# Patient Record
Sex: Female | Born: 1935 | Race: White | Hispanic: No | Marital: Married | Smoking: Never smoker
Health system: Southern US, Community
[De-identification: ages and names within clinical notes are randomized; demographics above are authoritative.]

## PROBLEM LIST (undated history)

## (undated) DIAGNOSIS — M1990 Unspecified osteoarthritis, unspecified site: Secondary | ICD-10-CM

## (undated) DIAGNOSIS — T148XXA Other injury of unspecified body region, initial encounter: Secondary | ICD-10-CM

## (undated) DIAGNOSIS — M797 Fibromyalgia: Secondary | ICD-10-CM

## (undated) DIAGNOSIS — I2510 Atherosclerotic heart disease of native coronary artery without angina pectoris: Secondary | ICD-10-CM

## (undated) DIAGNOSIS — G20 Parkinson's disease: Secondary | ICD-10-CM

## (undated) DIAGNOSIS — F458 Other somatoform disorders: Secondary | ICD-10-CM

## (undated) DIAGNOSIS — G20A1 Parkinson's disease without dyskinesia, without mention of fluctuations: Secondary | ICD-10-CM

## (undated) DIAGNOSIS — E785 Hyperlipidemia, unspecified: Secondary | ICD-10-CM

## (undated) DIAGNOSIS — R011 Cardiac murmur, unspecified: Secondary | ICD-10-CM

## (undated) DIAGNOSIS — R609 Edema, unspecified: Secondary | ICD-10-CM

## (undated) DIAGNOSIS — K589 Irritable bowel syndrome without diarrhea: Secondary | ICD-10-CM

## (undated) DIAGNOSIS — R09A2 Foreign body sensation, throat: Secondary | ICD-10-CM

## (undated) DIAGNOSIS — K219 Gastro-esophageal reflux disease without esophagitis: Secondary | ICD-10-CM

## (undated) DIAGNOSIS — F419 Anxiety disorder, unspecified: Secondary | ICD-10-CM

## (undated) DIAGNOSIS — I639 Cerebral infarction, unspecified: Secondary | ICD-10-CM

## (undated) DIAGNOSIS — R52 Pain, unspecified: Secondary | ICD-10-CM

## (undated) DIAGNOSIS — D329 Benign neoplasm of meninges, unspecified: Secondary | ICD-10-CM

## (undated) HISTORY — PX: KIDNEY SURGERY: SHX687

## (undated) HISTORY — PX: BRAIN SURGERY: SHX531

## (undated) HISTORY — DX: Hyperlipidemia, unspecified: E78.5

## (undated) HISTORY — PX: TONSILLECTOMY: SUR1361

## (undated) HISTORY — PX: PARTIAL HYSTERECTOMY: SHX80

## (undated) HISTORY — DX: Fibromyalgia: M79.7

## (undated) HISTORY — DX: Cerebral infarction, unspecified: I63.9

## (undated) HISTORY — DX: Irritable bowel syndrome, unspecified: K58.9

## (undated) HISTORY — PX: GLAUCOMA REPAIR: SHX214

## (undated) HISTORY — PX: COLONOSCOPY: SHX174

## (undated) HISTORY — DX: Benign neoplasm of meninges, unspecified: D32.9

## (undated) HISTORY — DX: Other somatoform disorders: F45.8

## (undated) HISTORY — PX: CORONARY ANGIOPLASTY: SHX604

## (undated) HISTORY — PX: CATARACT EXTRACTION: SUR2

## (undated) HISTORY — DX: Atherosclerotic heart disease of native coronary artery without angina pectoris: I25.10

## (undated) HISTORY — DX: Foreign body sensation, throat: R09.A2

---

## 2002-09-29 ENCOUNTER — Ambulatory Visit (HOSPITAL_COMMUNITY): Admission: RE | Admit: 2002-09-29 | Discharge: 2002-09-29 | Payer: Self-pay | Admitting: *Deleted

## 2002-10-16 ENCOUNTER — Ambulatory Visit (HOSPITAL_COMMUNITY): Admission: RE | Admit: 2002-10-16 | Discharge: 2002-10-16 | Payer: Self-pay | Admitting: *Deleted

## 2002-10-29 ENCOUNTER — Encounter: Payer: Self-pay | Admitting: Cardiology

## 2002-10-29 ENCOUNTER — Encounter (HOSPITAL_COMMUNITY): Admission: RE | Admit: 2002-10-29 | Discharge: 2002-11-28 | Payer: Self-pay | Admitting: Cardiology

## 2002-12-11 ENCOUNTER — Encounter: Payer: Self-pay | Admitting: Urology

## 2002-12-11 ENCOUNTER — Ambulatory Visit (HOSPITAL_COMMUNITY): Admission: RE | Admit: 2002-12-11 | Discharge: 2002-12-11 | Payer: Self-pay | Admitting: Urology

## 2003-02-22 ENCOUNTER — Ambulatory Visit (HOSPITAL_COMMUNITY): Admission: RE | Admit: 2003-02-22 | Discharge: 2003-02-22 | Payer: Self-pay | Admitting: Urology

## 2003-02-22 ENCOUNTER — Ambulatory Visit (HOSPITAL_BASED_OUTPATIENT_CLINIC_OR_DEPARTMENT_OTHER): Admission: RE | Admit: 2003-02-22 | Discharge: 2003-02-22 | Payer: Self-pay | Admitting: Urology

## 2003-03-24 ENCOUNTER — Encounter: Admission: RE | Admit: 2003-03-24 | Discharge: 2003-03-24 | Payer: Self-pay | Admitting: Urology

## 2003-05-10 ENCOUNTER — Ambulatory Visit (HOSPITAL_COMMUNITY): Admission: RE | Admit: 2003-05-10 | Discharge: 2003-05-10 | Payer: Self-pay | Admitting: Urology

## 2003-05-10 ENCOUNTER — Ambulatory Visit (HOSPITAL_BASED_OUTPATIENT_CLINIC_OR_DEPARTMENT_OTHER): Admission: RE | Admit: 2003-05-10 | Discharge: 2003-05-10 | Payer: Self-pay | Admitting: Urology

## 2003-07-19 ENCOUNTER — Ambulatory Visit (HOSPITAL_COMMUNITY): Admission: RE | Admit: 2003-07-19 | Discharge: 2003-07-19 | Payer: Self-pay | Admitting: Urology

## 2003-10-07 ENCOUNTER — Ambulatory Visit (HOSPITAL_COMMUNITY): Admission: RE | Admit: 2003-10-07 | Discharge: 2003-10-07 | Payer: Self-pay | Admitting: Urology

## 2003-11-15 ENCOUNTER — Inpatient Hospital Stay (HOSPITAL_COMMUNITY): Admission: RE | Admit: 2003-11-15 | Discharge: 2003-11-18 | Payer: Self-pay | Admitting: Urology

## 2003-11-15 ENCOUNTER — Encounter (INDEPENDENT_AMBULATORY_CARE_PROVIDER_SITE_OTHER): Payer: Self-pay | Admitting: Specialist

## 2004-03-28 ENCOUNTER — Ambulatory Visit (HOSPITAL_COMMUNITY): Admission: RE | Admit: 2004-03-28 | Discharge: 2004-03-28 | Payer: Self-pay | Admitting: Urology

## 2004-04-26 ENCOUNTER — Ambulatory Visit: Payer: Self-pay | Admitting: *Deleted

## 2004-08-03 ENCOUNTER — Ambulatory Visit (HOSPITAL_COMMUNITY): Admission: RE | Admit: 2004-08-03 | Discharge: 2004-08-03 | Payer: Self-pay | Admitting: Family Medicine

## 2004-10-18 ENCOUNTER — Ambulatory Visit (HOSPITAL_COMMUNITY): Admission: RE | Admit: 2004-10-18 | Discharge: 2004-10-18 | Payer: Self-pay | Admitting: Family Medicine

## 2005-05-02 ENCOUNTER — Ambulatory Visit: Payer: Self-pay | Admitting: *Deleted

## 2005-08-20 ENCOUNTER — Ambulatory Visit (HOSPITAL_COMMUNITY): Admission: RE | Admit: 2005-08-20 | Discharge: 2005-08-20 | Payer: Self-pay | Admitting: Family Medicine

## 2005-08-24 ENCOUNTER — Ambulatory Visit (HOSPITAL_COMMUNITY): Admission: RE | Admit: 2005-08-24 | Discharge: 2005-08-24 | Payer: Self-pay | Admitting: Neurosurgery

## 2005-09-05 ENCOUNTER — Ambulatory Visit: Payer: Self-pay | Admitting: *Deleted

## 2005-09-05 ENCOUNTER — Encounter: Payer: Self-pay | Admitting: Internal Medicine

## 2005-09-12 ENCOUNTER — Encounter (INDEPENDENT_AMBULATORY_CARE_PROVIDER_SITE_OTHER): Payer: Self-pay | Admitting: *Deleted

## 2005-09-12 ENCOUNTER — Inpatient Hospital Stay (HOSPITAL_COMMUNITY): Admission: RE | Admit: 2005-09-12 | Discharge: 2005-09-19 | Payer: Self-pay | Admitting: Neurosurgery

## 2005-09-15 ENCOUNTER — Encounter: Payer: Self-pay | Admitting: Internal Medicine

## 2005-09-15 ENCOUNTER — Encounter: Payer: Self-pay | Admitting: Vascular Surgery

## 2005-09-15 ENCOUNTER — Ambulatory Visit: Payer: Self-pay | Admitting: Internal Medicine

## 2005-09-17 ENCOUNTER — Ambulatory Visit: Payer: Self-pay | Admitting: Physical Medicine & Rehabilitation

## 2005-09-19 ENCOUNTER — Inpatient Hospital Stay (HOSPITAL_COMMUNITY)
Admission: RE | Admit: 2005-09-19 | Discharge: 2005-10-05 | Payer: Self-pay | Admitting: Physical Medicine & Rehabilitation

## 2005-10-08 ENCOUNTER — Encounter (HOSPITAL_COMMUNITY)
Admission: RE | Admit: 2005-10-08 | Discharge: 2005-11-07 | Payer: Self-pay | Admitting: Physical Medicine & Rehabilitation

## 2005-11-01 ENCOUNTER — Encounter: Admission: RE | Admit: 2005-11-01 | Discharge: 2005-11-01 | Payer: Self-pay | Admitting: Neurosurgery

## 2005-11-07 ENCOUNTER — Ambulatory Visit: Payer: Self-pay | Admitting: *Deleted

## 2005-11-07 ENCOUNTER — Encounter: Payer: Self-pay | Admitting: Internal Medicine

## 2005-11-08 ENCOUNTER — Encounter (HOSPITAL_COMMUNITY)
Admission: RE | Admit: 2005-11-08 | Discharge: 2005-12-08 | Payer: Self-pay | Admitting: Physical Medicine & Rehabilitation

## 2005-11-15 ENCOUNTER — Encounter
Admission: RE | Admit: 2005-11-15 | Discharge: 2006-02-13 | Payer: Self-pay | Admitting: Physical Medicine & Rehabilitation

## 2005-11-15 ENCOUNTER — Ambulatory Visit: Payer: Self-pay | Admitting: Physical Medicine & Rehabilitation

## 2005-11-16 ENCOUNTER — Ambulatory Visit: Payer: Self-pay | Admitting: *Deleted

## 2006-01-14 ENCOUNTER — Ambulatory Visit: Payer: Self-pay | Admitting: Physical Medicine & Rehabilitation

## 2006-01-17 ENCOUNTER — Encounter (HOSPITAL_COMMUNITY)
Admission: RE | Admit: 2006-01-17 | Discharge: 2006-02-15 | Payer: Self-pay | Admitting: Physical Medicine & Rehabilitation

## 2006-02-11 ENCOUNTER — Encounter
Admission: RE | Admit: 2006-02-11 | Discharge: 2006-05-12 | Payer: Self-pay | Admitting: Physical Medicine & Rehabilitation

## 2006-02-15 ENCOUNTER — Ambulatory Visit: Payer: Self-pay | Admitting: Internal Medicine

## 2006-03-13 ENCOUNTER — Encounter (HOSPITAL_COMMUNITY)
Admission: RE | Admit: 2006-03-13 | Discharge: 2006-04-12 | Payer: Self-pay | Admitting: Physical Medicine & Rehabilitation

## 2006-03-25 ENCOUNTER — Ambulatory Visit: Payer: Self-pay | Admitting: Physical Medicine & Rehabilitation

## 2006-04-15 ENCOUNTER — Encounter (HOSPITAL_COMMUNITY)
Admission: RE | Admit: 2006-04-15 | Discharge: 2006-05-15 | Payer: Self-pay | Admitting: Physical Medicine & Rehabilitation

## 2006-06-21 ENCOUNTER — Emergency Department (HOSPITAL_COMMUNITY): Admission: EM | Admit: 2006-06-21 | Discharge: 2006-06-21 | Payer: Self-pay | Admitting: Emergency Medicine

## 2006-07-17 ENCOUNTER — Encounter
Admission: RE | Admit: 2006-07-17 | Discharge: 2006-10-15 | Payer: Self-pay | Admitting: Physical Medicine & Rehabilitation

## 2006-07-18 ENCOUNTER — Ambulatory Visit: Payer: Self-pay | Admitting: Physical Medicine & Rehabilitation

## 2006-08-02 ENCOUNTER — Ambulatory Visit: Payer: Self-pay | Admitting: Gastroenterology

## 2006-09-10 ENCOUNTER — Encounter: Admission: RE | Admit: 2006-09-10 | Discharge: 2006-09-10 | Payer: Self-pay | Admitting: Neurosurgery

## 2007-02-27 ENCOUNTER — Ambulatory Visit (HOSPITAL_COMMUNITY): Admission: RE | Admit: 2007-02-27 | Discharge: 2007-02-27 | Payer: Self-pay | Admitting: Family Medicine

## 2007-05-02 ENCOUNTER — Ambulatory Visit (HOSPITAL_COMMUNITY): Admission: RE | Admit: 2007-05-02 | Discharge: 2007-05-02 | Payer: Self-pay | Admitting: Family Medicine

## 2007-08-01 ENCOUNTER — Encounter: Admission: RE | Admit: 2007-08-01 | Discharge: 2007-08-01 | Payer: Self-pay | Admitting: Neurosurgery

## 2008-02-05 ENCOUNTER — Ambulatory Visit: Payer: Self-pay | Admitting: Gastroenterology

## 2008-02-13 ENCOUNTER — Ambulatory Visit (HOSPITAL_COMMUNITY): Admission: RE | Admit: 2008-02-13 | Discharge: 2008-02-13 | Payer: Self-pay | Admitting: Gastroenterology

## 2008-05-03 ENCOUNTER — Ambulatory Visit (HOSPITAL_COMMUNITY): Admission: RE | Admit: 2008-05-03 | Discharge: 2008-05-03 | Payer: Self-pay | Admitting: Family Medicine

## 2008-11-03 DIAGNOSIS — E785 Hyperlipidemia, unspecified: Secondary | ICD-10-CM | POA: Insufficient documentation

## 2008-11-03 DIAGNOSIS — I2510 Atherosclerotic heart disease of native coronary artery without angina pectoris: Secondary | ICD-10-CM

## 2008-12-10 ENCOUNTER — Ambulatory Visit (HOSPITAL_COMMUNITY): Payer: Self-pay | Admitting: Family Medicine

## 2008-12-10 ENCOUNTER — Encounter (HOSPITAL_COMMUNITY): Admission: RE | Admit: 2008-12-10 | Discharge: 2009-01-09 | Payer: Self-pay | Admitting: Family Medicine

## 2009-05-04 ENCOUNTER — Ambulatory Visit (HOSPITAL_COMMUNITY): Admission: RE | Admit: 2009-05-04 | Discharge: 2009-05-04 | Payer: Self-pay | Admitting: Family Medicine

## 2009-08-01 ENCOUNTER — Ambulatory Visit (HOSPITAL_COMMUNITY): Admission: RE | Admit: 2009-08-01 | Discharge: 2009-08-01 | Payer: Self-pay | Admitting: Family Medicine

## 2009-08-24 ENCOUNTER — Ambulatory Visit (HOSPITAL_COMMUNITY): Admission: RE | Admit: 2009-08-24 | Discharge: 2009-08-24 | Payer: Self-pay | Admitting: Urology

## 2010-03-02 ENCOUNTER — Ambulatory Visit (HOSPITAL_COMMUNITY)
Admission: RE | Admit: 2010-03-02 | Discharge: 2010-03-02 | Payer: Self-pay | Source: Home / Self Care | Admitting: Family Medicine

## 2010-04-19 ENCOUNTER — Ambulatory Visit: Payer: Medicare Other | Admitting: Ophthalmology

## 2010-05-02 ENCOUNTER — Ambulatory Visit: Payer: Medicare Other | Admitting: Ophthalmology

## 2010-05-11 ENCOUNTER — Ambulatory Visit (HOSPITAL_COMMUNITY)
Admission: RE | Admit: 2010-05-11 | Discharge: 2010-05-11 | Payer: Self-pay | Source: Home / Self Care | Attending: Family Medicine | Admitting: Family Medicine

## 2010-06-10 ENCOUNTER — Encounter: Payer: Self-pay | Admitting: Urology

## 2010-06-11 ENCOUNTER — Encounter: Payer: Self-pay | Admitting: *Deleted

## 2010-06-11 ENCOUNTER — Encounter: Payer: Self-pay | Admitting: Physical Medicine & Rehabilitation

## 2010-10-03 NOTE — H&P (Signed)
NAME:  Samantha Hudson, Samantha Hudson               ACCOUNT NO.:  [id number]   MEDICAL RECORD NO.:  [id number]          PATIENT TYPE:  AMB   LOCATION:  DAY                           FACILITY:  [facility]   PHYSICIAN:  Samantha Hudson, M.D.      DATE OF BIRTH:  Oct 16, 1934   DATE OF ADMISSION:  DATE OF DISCHARGE:  LH                              HISTORY & PHYSICAL   PRIMARY CARE PHYSICIAN:  Samantha A. Gerda Diss, MD   CHIEF COMPLAINT:  Dysphagia.   HISTORY OF PRESENT ILLNESS:  Samantha Hudson is a 76 year old Caucasian  female.  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  Samantha Hudson also has history  of IBS.  Samantha Hudson has noted increased saliva and increased throat clearing  with pulling in the back of her throat since Samantha Hudson had a CVA back in 2007.  Samantha Hudson is noticing it is harder to get her food down her esophagus.  Samantha Hudson  feels as though it sometimes gets stuck in her upper esophagus.  Samantha Hudson has  to swallow several times before food goes down.  Samantha Hudson denies any problems  with regurgitation.  Samantha Hudson was taking ranitidine p.r.n. for coughing and  frequent throat clearing, but especially occurred nocturnally.  Samantha Hudson  denies any heartburn or indigestion.  Denies any anorexia.  Her weight  is down about 15 pounds since her CVA 2 years ago, but has been  relatively stable over the last year.  Samantha Hudson denies any problems with her  bowel movements.  Samantha Hudson occasionally has taken lactulose for constipation.  Samantha Hudson denies any positive diarrhea, rectal bleeding, or melena.   PAST MEDICAL AND SURGICAL HISTORY:  1. Samantha Hudson had a CVA in 2007 with some left-sided deficit.  2. Samantha Hudson has history of mitral valve prolapse.  3. Hypercholesterolemia.  4. Osteoporosis.  5. Coronary artery disease with PTCA.  6. IBS.  7. Fibromyalgia.  8. Globus hystericus.  9. Neuropathy.  10.Benign brain tumor was removed back in 2007 and 2 days postop of      when Samantha Hudson had a CVA.  11.Samantha Hudson is status post tonsillectomy.  12.Partial hysterectomy.  13.Right eye surgery.  14.Left kidney surgery.  15.Meningioma.  16.History of glaucoma.   CURRENT MEDICATIONS:  1. Atenolol 25 mg b.i.d.  2. Aggrenox b.i.d.  3. Gabapentin 400 mg q.i.d.  4. Lipitor 10 mg daily.  5. Cymbalta 30 mg daily.   ALLERGIES:  PENICILLIN causes rash.   FAMILY HISTORY:  There is a family history of liver disease in her  father who died at 76.  Samantha Hudson does not know anything else about this.  Mother deceased in her 80s secondary to coronary artery disease.  Two  sisters, one with breast cancer and 2 healthy brothers.   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  Samantha Hudson has 2 grown  healthy daughters.  Samantha Hudson is married.  Samantha Hudson denies any tobacco, alcohol, or  drug use.   REVIEW OF SYSTEMS:  See HPI, otherwise negative.   PHYSICAL EXAMINATION:  VITAL SIGNS: Weight 106 pounds, height 65.5  inches, temperature 97.4, blood pressure 116/52, and pulse 92.  GENERAL:  Samantha Hudson is a well-developed, well-nourished elderly Caucasian  female in no acute distress.  Samantha Hudson is quite thin.  HEENT:  Sclerae clear, nonicteric.  Conjunctivae pink.  Oropharynx pink  and moist without any lesions.  NECK:  Supple without mass or thyromegaly.  CHEST:  Heart regular rate and rhythm.  Normal S1 and S2 without  murmurs, clicks, rubs, or gallops.  Samantha Hudson does have a 2/6 murmur noted.  LUNGS:  Clear to auscultation bilaterally.  ABDOMEN:  Positive bowel sounds x4.  No bruits auscultated.  Soft,  nontender, and nondistended without palpable mass or hepatosplenomegaly.  No rebound tenderness or guarding.  EXTREMITIES:  No clubbing or edema.  Samantha Hudson does have mild left-sided  extremity weakness.   Laboratory studies from January 30, 2008, Samantha Hudson had a normal MET-7 and  normal LFTs.   IMPRESSION:  Samantha Hudson is a 76 year old Caucasian female with history of  cerebrovascular accident who has been having dysphagia, which has  worsened over the last 2 years since her cerebrovascular accident.  Samantha Hudson  also has history of globus sensation, increased  throat clearing, and  pooling of secretions in the posterior oropharynx.  Samantha Hudson denies any  gastroesophageal reflux disease symptoms.  Samantha Hudson may have an element of  esophageal dysphagia, but much of her symptoms could be explained by  oropharyngeal dysphagia.  Samantha Hudson is going to require further evaluation.  We did talk about starting with an esophagogastroduodenoscopy to rule  out structural abnormalities including esophageal web ring or stricture  versus pursuing barium pill esophagram, which should be able to rule out  all the above as well as look for oropharyngeal component.  Samantha Hudson would  like to proceed with the latter.   PLAN:  1. Barium pill esophagram.  If there is evidence of web ring stricture      or malignancy, Samantha Hudson will need followup EGD      with possible esophageal dilatation.  This has been explained to      her.  Samantha Hudson is in agreement with this plan.  2. Further recommendations pending barium pill esophagram.   ADDENDUM 161096:  BaSw: nonspecific esophageal motility disorder      Samantha Hudson, N.P.      Samantha Hudson, M.D.  Electronically Signed    KJ/MEDQ  D:  02/05/2008  T:  02/06/2008  Job:  [id number]   cc:   Samantha Picket A. Gerda Diss, MD  Fax: [phone]

## 2010-10-06 NOTE — H&P (Signed)
[name], Hudson NO.:  [id number]   MEDICAL RECORD NO.:  [id number]          PATIENT TYPE:  IPS   LOCATION:  NA                           FACILITY:  [facility]   PHYSICIAN:  Ranelle Oyster, M.D.DATE OF BIRTH:  Feb 09, 1935   DATE OF ADMISSION:  DATE OF DISCHARGE:                                HISTORY & PHYSICAL   OTHER PHYSICIANS OF RECORD:  Dr. Jeral Fruit, Dr. Pearlean Brownie, and Dr. Gerda Diss in  [place] - the patient's PCP.   CHIEF COMPLAINT:  Left-sided weakness.   HISTORY OF PRESENT ILLNESS:  This is a 76 year old white female admitted on  September 12, 2005, with headache and left-sided weakness.  MRI revealed a large  tumor in the middle fossa felt to be a meningioma.  The patient underwent a  right frontotemporal craniectomy with resection of meningioma on April 25 by  Dr. Jeral Fruit.  Postoperatively, the patient has had left-sided weakness.  The  patient upon followup MRI was found to have a low density right MCA stroke  secondary to complete occlusion of the right MCA.  Echocardiogram was  unremarkable.  The patient was placed on aspirin and Plavix by neurology and  changed to Aggrenox on May 2.  The patient is on D-3 honey liquid diet due  to dysphagia.  The patient had a repeat swallowing test today where her diet  was advanced to D-3 thin liquids.  Followup head CT ordered today is still  pending.   REVIEW OF SYSTEMS:  The patient has noted anxiety, occasional headaches, and  reflux.  Otherwise, pertinent positives are listed above and full review is  in the written H&P.   PAST MEDICAL HISTORY:  Positive for hypertension, hyperlipidemia, coronary  artery disease, left laser eye surgery, history of cysto/ureteroscopy,  history of T&A, hysterectomy, and anxiety disorder.  She smoked tobacco  remotely.   FAMILY HISTORY:  Positive for coronary artery disease.   SOCIAL HISTORY:  The patient is retired, married.  She lives in a one-level  house with two steps to  enter.  Husband works full-time as a Teacher, early years/pre.  The patient has excellent family support at home.  The patient was  independent completely prior to arrival.   MEDICATIONS PRIOR TO ARRIVAL:  1.  Aspirin 325 mg daily.  2.  Lipitor 10 mg daily.  3.  Atenolol 25 mg b.i.d.  4.  Lorazepam 0.5 mg daily.  5.  Paxil 10 mg daily.   ALLERGIES:  1.  PENICILLIN.  2.  CEPHALOSPORINS.  3.  LACTOSE.   LABORATORIES:  Include hemoglobin 9.7; white count 10.9; platelets 101,000.  Sodium 132, potassium 4.2, BUN and creatinine 8 and 0.6.   PHYSICAL EXAMINATION:  VITAL SIGNS:  Blood pressure is 130/60, pulse is 80,  respiratory rate is 18, temperature is 98.3.  GENERAL:  The patient is pleasant, in no acute distress.  HEENT:  Pupils equal, round, and reactive to light and accommodation.  Extraocular eye movements are intact.  Ear, nose, and throat exam is  unremarkable.  Oral mucosa is generally pink and moist.  NECK:  Supple without JVD  or lymphadenopathy.  CHEST:  Clear to auscultation bilaterally without wheezes, rales, or  rhonchi.  HEART:  Regular rate and rhythm without murmurs, rubs, or gallops.  ABDOMEN:  Soft, nontender.  NEUROLOGIC:  Cranial nerves II-XII are grossly intact.  Reflexes are 2++ on  the left, slightly decreased on the right.  Sensation slightly decreased on  the left side today.  The patient may have had a slight inattention to the  left today. Motor function is 5/5 on the right, 2/5 to 3/5 on the left.  Judgment and orientation were generally appropriate.  The patient had  decreased awareness and memory.  Mood was flat but appropriate for the  situation.  SKIN:  Generally intact.  She had a right-sided craniotomy scar which is  clean and appropriate.   ASSESSMENT AND PLAN:  1.  Functional deficits secondary to right-sided meningioma with associated      right middle cerebral artery stroke postoperatively.  The patient has      resection of her meningioma on April 25.   The patient is to begin      comprehensive inpatient rehab with PT to assess and treat patient for      mobility, balance, and lower extremity strengthening.  OT will assess      for upper extremity use and ADLs.  Speech will follow for cognitive,      swallowing, and language issues.  Rehab 24-hour nursing will help manage      bowel, bladder, and skin issues.  Rehab Child psychotherapist and case manager      will assess for psychosocial needs.  Estimated length of stay is 2-3      weeks.  Prognosis fair.  Goals minimum assist.  2.  Anticoagulation with Aggrenox.  3.  Dysphagia:  Diet advanced to D-3 and thins.  Observe intake and monitor      electrolytes serially.  4.  Deep venous thrombosis prophylaxis:  Thigh-high TED hose and PAS      stockings.  5.  Hypertension:  Atenolol.  6.  Hyperlipidemia:  Lipitor.  7.  Anxiety:  Xanax.  8.  Bladder:  Discontinue Foley in the morning and begin a voiding trial.      Ranelle Oyster, M.D.  Electronically Signed     ZTS/MEDQ  D:  09/19/2005  T:  09/19/2005  Job:  [id number]

## 2010-10-06 NOTE — Assessment & Plan Note (Signed)
A 76 year old female with post meningioma resection CVA, right MCA  distribution.  Left hemiparesis has resolved.  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  Has gone  through some therapy for this.  She at least 3 episodes where her tongue  got heavy.  One was while she was on Lyrica, one occurred about 4 days  after she discontinued and one occurred more than a week after she  discontinued Lyrica.  She saw ED on one of these visits.  They state she  had no stroke.  She saw neurology, Dr. Pearlean Brownie, and he also felt she had  no stroke.  She had some dysesthetic pain, i.e., hypersensitivity to  touch in the left hand and left foot.  She is using Darvocet about once  a week.  She is not taking any other medicines for pain.   CURRENT MEDICATIONS:  1. Aggrenox for stroke prophylaxis.  2. Lipitor for hyperlipidemia and stroke prophylaxis.  3. Atenolol for hypertension and stroke prophylaxis.  4. Celexa up to 20 mg a day for post stroke depression/anxiety      symptoms.   REVIEW OF SYSTEMS:  She needs some assistance for household duties.  She  has depression/anxiety but these have improved.  She has had abdominal  pain with frequent BMs and had to move her to a gastroenterologist.   SOCIAL HISTORY:  She is married, lives with her husband who owns a  pharmacy.   PHYSICAL EXAMINATION:  VITAL SIGNS:  Blood pressure 105/34, pulse 63,  respirations 16, O2 sat 100% on room air.  GENERAL:  Mood and affect appropriate.  Orientation x3.  Mood and affect  are bright, alert.  Gait is normal.  Is able toe-walk and heel-walk, her  Romberg is negative.  Finger-nose-to-finger testing is intact  bilaterally.  Cranial nerves II, III, IV, V, VI, VII are intact.  She  has mild tenderness to palpation left trochanteric bursa.   IMPRESSION:  History of CVA with residual dysesthetic pain.  She does  have hypersensitivity to touch over her hand an foot.  Discussed role of  atypical anti-convulsants.  She had been started on Neurontin but only  got a 200 mg dosage prior to switching to Lyrica.  This likely did not  represent an adequate trial.  We did discuss that Lyrica and Neurontin  have enough similarities that there may be similar side effects as well.  Watch out for drowsiness and tremors.   PLAN:  We will start with Neurontin 300 q. h.s., increase to b.i.d.  after a week, t.i.d. and then q.i.d.  If she has any excessive sedation  she is to back down to the previous dosage.  I will see her in 6 weeks  to re-assess efficacy and tolerability.      Erick Colace, M.D.  Electronically Signed     AEK/MedQ  D:  07/18/2006 14:01:53  T:  07/18/2006 14:56:18  Job #:  [id number]   cc:   Pearlean Brownie, M.D.   Scott A. Gerda Diss, MD  Fax: [phone]

## 2010-10-06 NOTE — Assessment & Plan Note (Signed)
HISTORY OF PRESENT ILLNESS:  A 76 year old female with history of CVA after  meningioma has had physical therapy both as an inpatient and as an  outpatient.  She had a left hemiparesis, which really has largely resolved  since leaving the hospital this summer.  She has been getting outpatient  therapy at [facility].  She has had workup in regard to left hip pain.  An  MRI demonstrated some mild facet changes in the lower lumbar level,  otherwise, no compressive lesions, some loss of disk height L3-4 and  L5-S1.  She has had an EMG showing no significant findings.  She has had resolution  of left hip trochanteric bursa pain after bursa injection.  Her pain is  really on the right side now, she notes mainly on the right buttocks area  and the posterior thigh.  She continues to receive some physical therapy.  Her gait shows no evidence of knee  instability.  She has no hypertonicity.  She has normal range of motion in the hip, knee, ankle.  She has positive  Faber's maneuver on the right side.   PHYSICAL EXAMINATION:  Vital signs:  Vital signs are stable.  O2 sat 100%  room air.   IMPRESSION:  Right buttocks pain, maybe sacroiliac.  She has some muscle  imbalance related to prior cerebrovascular accident and has had increased  strengthening on the left side.  She does have some concomitant mild issue  of bursitis with tenderness over that area with associated hamstring spasm.   PLAN:  1. We will start her on some Skelaxin.  Start with 400 mg but she can go      up to 800 as needed up to 4 times a day.  2. Continue  Darvocet N 100.  She can take 1-2 tablets a day, gave her 45.  3. Have physical therapy try some iontophoresis.      Erick Colace, M.D.  Electronically Signed     AEK/MedQ  D:  03/26/2006 10:00:23  T:  03/26/2006 14:53:12  Job #:  [id number]

## 2010-10-06 NOTE — Assessment & Plan Note (Signed)
A 76 year old female with a history of stroke after a meningioma.  She had a  rather extensive workup for left lower extremity weakness including MRI of  the lumbar spine, which was unremarkable.  She also reportedly had an EMG  and family has been trying to get the report to me.  I diagnosed her with  left hip trochanteric bursa bursitis, and she has had significant  improvements following left trochanteric bursa injection as well as physical  therapy targeted at stretching the tensor fascia lata and strengthening hip  musculature.   PHYSICAL EXAMINATION:  Her examination today shows a normal gait, no  evidence of toe drag or knee instability.  She has minimal tenderness over  the greater trochanter and mild tenderness in the insertion of the gluteus  medius and maximus musculature on the posterior aspect of the greater  trochanter.  She has a mildly tight tensor fascia lata on the left side  compared to the right side.  She has pain with hip abduction and external  rotation, left greater than the right side, but she has some limited range  of motion at bilateral hips.   Her strength is essentially normal, bilateral upper and lower extremities.   IMPRESSION:  1. Left trochanteric bursitis, improving with a combination of      trochanteric bursa injection plus physical therapy.  2. Cerebrovascular accident, no residual weakness.   PLAN:  1. Continue physical therapy once a week x3 more weeks.  2. Would like to get EMG results.  The patient states she was told she had      peripheral neuropathy and some blood tests were also done.  She has      been placed on Lyrica, which she thinks may be helpful.  3. Continue with primary physician as well as neurology for secondary      prophylaxis of CVA, remains on Aggrenox for secondary stroke      prophylaxis as well as atenolol for hypertension and a pill for      hyperlipidemia.      Erick Colace, M.D.  Electronically  Signed     AEK/MedQ  D:  02/12/2006 13:42:55  T:  02/14/2006 11:31:39  Job #:  [id number]   cc:   Pramod P. Pearlean Brownie, MD  Fax: [phone]

## 2010-10-06 NOTE — Assessment & Plan Note (Signed)
Patient returns today.  She had been complaining of left hip pain.  Had  improvement after left hip troch-burs injection for about 10 days. Has  completed physical therapy and occupational therapy.  They really never  addressed her hip pain. Has had further work-up by Dr. Pearlean Brownie in  neurology  including MRI lumbar spine showing lumbar spondylosis with some disk  degeneration but no disk herniation.  EMG was apparently performed as well  but this was done just yesterday and no results are obtained at this time.   She rates her hip pain about 4 to 6 out of 10.  She sleeps fair.  She has  some pain with bending forward at the hip.  She could walk 10 minutes at a  time, climbs steps.  Does not drive yet even though she has been cleared to  drive in a gradual fashion.  She is retired, lives with her husband who  drives her around.  She has had some weight loss and numbness but otherwise  negative review of systems.   PHYSICAL EXAMINATION:  VITAL SIGNS:  Blood pressure 118/57, pulse 60,  respiratory rate 6, oxygen saturations 100% room air.  GENERAL:  In no acute distress.  Mood and affect appropriate.  Her gait  showed no evidence of toe drag or knee instability.  She has good range of  motion of her back.  No tenderness to palpation of her lumbar spine.  Does  have some tenderness, left greater trochanter.   Her hip range of motion some minor pain internal and external rotation and  mild limitation range of motion.  Her deep tendon reflexes are normal  bilateral lower extremities.   IMPRESSION:  1. Left trochanteric bursitis.  2. History of cerebrovascular accident.  Has no significant residual      weakness but expect increased fatigability on that side.   PLAN:  1. Will do repeat troch bursa injection today given that she is not a good      candidate for nonsteroidal anti-inflammatories at the current time      given the proximity of stroke.  2. Resend to physical therapy three times  a week x3 weeks to do stretching      and strengthening of the hip girdle musculature and modality of      treatment.   I will see her back in approximately one month.   ADDENDUM:  Left hip trochanteric bursa injection.  Informed consent was  obtained.  Area marked and prepped with Betadine and a 25 gauge 1-1/2 inch  needle inserted and bone contact, then slightly withdrawn, after negative  draw back of blood.  Solution containing 1 mL of 40 mg per mL Depo-Medrol  and 4 mL of 1% lidocaine were injected.  The patient tolerated the procedure  well. Post injection instructions given.      Erick Colace, M.D.  Electronically Signed     AEK/MedQ  D:  01/15/2006 14:03:12  T:  01/16/2006 10:04:08  Job #:  [id number]   cc:   Pramod P. Pearlean Brownie, MD  Fax: [phone]

## 2010-10-06 NOTE — Op Note (Signed)
Samantha Hudson, Samantha Hudson NO.:  [id number]   MEDICAL RECORD NO.:  [id number]          PATIENT TYPE:  INP   LOCATION:  2550                         FACILITY:  [facility]   PHYSICIAN:  Hilda Lias, M.D.   DATE OF BIRTH:  October 30, 1934   DATE OF PROCEDURE:  09/12/2005  DATE OF DISCHARGE:                                 OPERATIVE REPORT   PREOPERATIVE DIAGNOSIS:  Right middle fossa tumor, meningioma.   POSTOPERATIVE DIAGNOSIS:  Right middle fossa tumor, meningioma.   PROCEDURE:  1.  Right frontotemporal craniotomy.  2.  Total gross resection of middle fossa meningioma.  3.  Microscope.  4.  Cavitron.   SURGEON:  Hilda Lias, M.D.   ASSISTANT:  Stefani Dama, M.D.   CLINICAL HISTORY:  The patient was seen by me at the beginning of April of  this year, complaining of some headaches and some feeling in the head.  Also, she had something going on and she felt some funny things in the right  arm.  MRI of the brain showed that she has a large tumor in the middle  fossa, most likely a meningioma, with quite a bit of swelling.  She was  started on Decadron.  Today, she is being admitted for surgery.  She denied  more opinion.  The risks were explained to her and her husband in my office.   DESCRIPTION OF PROCEDURE:  The patient was taken to the operating room and  after intubation, the head was shaved.  Three-pin headholder was applied and  the head was turned slightly to the left side and down little bit 5 degrees.  The right frontotemporal area along with the face and the neck were prepped  with DuraPrep.  Drapes were applied.  Then a question-mark incision from the  ear, then posteriorly to the temporal area and anteriorly to the frontal  area was done.  Raney clips were applied to the edges.  The temporal muscle  was detached from the bone.  From then, #5 drill holes were made and  correction was done with the craniotome.  We gave some intravenous Lasix as  well as  Mannitol.  We removed the lower part of the temporal bone to give [place]  more access to the floor of the middle fossa.  The dura mater was opened  with a lower base.  Because the tumor was mostly in the right side in front  of the temporal bone and the area of the temporal lobe was a little  edematous, we decided to go ahead and remove the first 2 cm of  the temporal  lobe.  Immediately, we came into the cavity of the tumor.  Dissection was  carried down and with the Cavitron, we started removing the tumor.  Specimen  was sent to the laboratory.  At the end, working our way distal, proximal  and medial, we were able to remove the tumor.  There was a large artery  which was an artery going into the tumor itself with no exit.  Because of  that, it was removed and 2  clips were applied for hemostasis.  Dissection  was carried down, preserving the sylvian vein.  Then at the end, we found  that the tumor was mostly coming from the wing of the sphenoid bone.  Dissection was carried down and in that area, the dura mater was completely  cauterized.  Total gross resection was achieved.  After that, the area was  irrigated, hemostasis was done with bipolar and Surgicel was left in the  cavity of the tumor.  From then on, the dura mater was  closed with 4-0 silk.  Duragen was used for the edge to prevent CSF.  Then  the bone flap was pulled back in place using 3 plates and the scalp was  closed using Vicryl staples and 4-0 nylon to the forepart of the forehead.  A Hemovac was left in place.  The patient is going to go to the intensive  unit.           ______________________________  Hilda Lias, M.D.     EB/MEDQ  D:  09/12/2005  T:  09/13/2005  Job:  [id number]   cc:   Lorin Picket A. Gerda Diss, MD  Fax: [phone]

## 2010-10-06 NOTE — Discharge Summary (Signed)
NAMETAKITA, RIECKE NO.:  [id number]   MEDICAL RECORD NO.:  [id number]          PATIENT TYPE:  IPS   LOCATION:  4142                         FACILITY:  [facility]   PHYSICIAN:  Erick Colace, M.D.DATE OF BIRTH:  1934/08/11   DATE OF ADMISSION:  09/19/2005  DATE OF DISCHARGE:                                 DISCHARGE SUMMARY   DISCHARGE DIAGNOSES:  1.  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  2.  Occluded right MCA.  3.  Dysphagia, resolved.  4.  Hypertension.  5.  Hyperlipidemia.  6.  Escherichia coli urinary tract infection.  7.  Coronary artery disease with percutaneous transluminal coronary      angioplasty.  8.  Anxiety.   A 76 year old white female admitted September 12, 2005, with headache, left-  sided decreased coordination.  MRI with large tumor middle fossa felt to be  meningioma.  Underwent right frontotemporal craniotomy with resection middle  fossa meningioma April 25 per Dr. Jeral Fruit.  Postoperative left-sided  weakness.  MRI followup with low-density right middle cerebral artery.  MRA  with complete occlusion of the right MCA.  Echocardiogram with ejection  fraction of 65-70% without thrombi.  Neurology, Dr. Pearlean Brownie, initially placed  on aspirin therapy.  Plavix that was discontinued changed to Aggrenox Sep 19, 2005.  Modified barium swallow April 28 placed on a mechanical soft honey-  thick liquid diet.  Followup swallow study May 2 placed on a mechanical soft  with liquids advanced to thin liquids.  Followup cranial CT scan without  contrast on Sep 19, 2005, was pending.  Venous Doppler study of the lower  extremities April 28 were negative for deep venous thrombosis.  Transcranial  Dopplers were done, results pending.  The patient was admitted for a  comprehensive rehab program.   PAST MEDICAL HISTORY:  See discharge diagnoses.  Occasional alcohol, remote  smoker.   ALLERGIES:  1.   PENICILLIN.  2.  CEPHALOSPORINS.  3.  The patient to be LACTOSE INTOLERANT.   SOCIAL HISTORY:  Retired, married, one-level home, two steps to entry.  Husband works full-time as a Teacher, early years/pre.  She has good family support.   MEDICATIONS PRIOR TO ADMISSION:  1.  Aspirin 325 mg daily.  2.  Lipitor 10 mg daily.  3.  Atenolol 25 mg twice daily.  4.  Lorazepam 0.5 mg daily.  5.  Paroxetine 10 mg daily.   REHABILITATION HOSPITAL COURSE:  The patient was admitted to inpatient rehab  services with therapies initiated on a b.i.d. basis consisting of physical  therapy, occupational therapy, and rehabilitation nursing.  The following  issues were followed during the patient's rehab course.  Pertaining to Mrs.  Brabson' right middle cerebral artery infarction after meningioma resection,  remained stable.  Her coordination to the left side had greatly improved.  She was now essentially stand-by assist for her ambulation with much  improvement in her endurance and fatigue factors.  She had some mild  decrease in attention and awareness to the left side.  Followup cranial CT  scan at the request of neurosurgery, Dr. Jeral Fruit, Sep 21, 2005, showed some re-  demonstration of right frontal craniotomy for tumor resection; edema around  the resection site had been unchanged; minimal interval increase in right  subdural hematoma; there was a decrease in right-to-left midline shift.  She  would follow up with neurosurgery.  Her diet had been advanced to regular  which she tolerated well.  During her rehabilitation course she did spike a  low-grade fever.  Workup of blood cultures were negative.  Chest x-ray with  minimal atelectasis.  Urine study positive for E. coli urinary tract  infection.  Placed on Cipro for 7 days.  She remained afebrile from that  time on.  Followup chemistries were unremarkable.  She did receive a CT of  the sinuses May 14 showing sinusitis with bubbly secretions in the maxillary   sinuses.  She was placed on Bactrim for her sinusitis x10 days beginning on  May 15.  Her blood pressures remained monitored with Tenormin.  She would  remain on Aggrenox after recent cerebrovascular accident.  Functionally, as  noted above, she was attending full therapies, overall supervision to  minimal assist for stairs with some cues, overall supervision to minimal  assist for showering and activities of daily living.  Family teaching was  completed with excellent support from family.  She was discharged to home.   Latest labs showed a hemoglobin 10.3, hematocrit 29.6, WBC 4.8, platelets  174.  Sodium 137, potassium 4.1, BUN 9, creatinine 0.8.   DISCHARGE MEDICATIONS AT TIME OF DICTATION:  1.  Multivitamin daily.  2.  Aggrenox one capsule p.o. b.i.d.  3.  Tenormin 50 mg daily.  4.  Lactaid one tablet three times daily.  5.  Bactrim DS one tablet twice daily until Oct 11, 2005, for sinusitis.  6.  Lipitor 10 mg daily.  7.  Tylenol as needed.   ACTIVITY:  As tolerated.   DIET:  Regular.   SPECIAL INSTRUCTIONS:  Continue therapy as advised per rehab services.  Follow up with Dr. Jeral Fruit, [phone], neurosurgery; Dr. Gerda Diss, medical  management; and Dr. Claudette Laws at the outpatient rehab service office  as advised.      Mariam Dollar, P.A.      Erick Colace, M.D.  Electronically Signed    DA/MEDQ  D:  10/04/2005  T:  10/04/2005  Job:  [id number]   cc:   Hilda Lias, M.D.  Fax: [phone]   Pramod P. Pearlean Brownie, MD  Fax: [phone]   W. Simone Curia, M.D.  Fax: [phone]

## 2010-10-06 NOTE — Assessment & Plan Note (Signed)
REASON FOR VISIT:  Follow-up status post hospitalization for perioperative  right MCA distribution CVA following resection of right intracranial  meningioma.   Samantha Hudson returns today having completed her outpatient PT, OT, and speech  therapy.  She has had no new weakness or stroke-related symptoms.  She has,  however, developed some left hip pain which she notes during activity where  she had to do extensive standing on her left leg.  Physical therapy notes  that she improved to a perfect score on the Berg balance assessment and that  her muscle strength on the left side got back to a 5-/5 strength.  She still  feels some funny tingling sensations on the left side of her body including  face, hand, and particularly leg.   She states her pain ranges from a 3-5/10 burning, sharp, stabbing,  intermittent, and some aching as well.  She can walk five minutes at a time.  She climbs steps.  She does not drive yet.  She is retired.  She needs some  help with household duties and review of systems positive for anxiety,  spasms, numbness, tingling, constipation, abdominal pain.   SOCIAL HISTORY:  Married.  Lives with her husband.  Her daughters also help  out with her.   Her blood pressure is 107/40, pulse 67, respirations 16, O2 saturations 100%  room air.   General:  No acute distress.  She is able to toe walk, heel walk, do tandem  gait.  She has 5-/5 strength in left side in the deltoid, triceps, biceps  grips as well as hip flexor, knee extensor, ankle dorsiflexor.  Is 5/5 on  the right side.  She has no deficits with finger-nose-finger testing, heel  to shin testing.  She is able to finger to some opposition.  There is mild  paraesthesias left hand, but no sensory loss per se.  She has normal deep  tendon reflexes.  No evidence of clonus.   Her left hip has tenderness to palpation over the greater trochanter and  also along the muscular tendinous portion of the hip abductor muscles  on the  left side.   IMPRESSION:  1.  Right middle cerebral artery distribution infarct.  2.  History of left hemisensory deficits which have persisted or left      hemiparesis which has largely resolved and a left neglect which has      resolved as well to confrontation testing.  3.  Left hip trochanteric bursitis.   PLAN:  1.  I think she can return to driving in a gradual fashion, i.e., start with      another licensed driver in an empty parking lot, graduate to small      streets and then build up to more busy streets with a licensed driver in      the car with her prior to starting on her own.  Recommend no interstate      or nighttime driving.  2.  Left hip injection for trochanteric bursitis.  3.  Neurologic follow-up.  She has questions regarding how long she needs to      be on Aggrenox.  I will defer these to Dr. Pearlean Brownie.   I will see the patient back in about two months.  If she develops recurrent  pain or persistent pain in the left lower extremity as well as the left  upper and left side of face would start Neurontin 100 mg building up to  q.i.d.  Erick Colace, M.D.  Electronically Signed     AEK/MedQ  D:  11/20/2005 13:15:13  T:  11/20/2005 13:36:08  Job #:  [id number]   cc:   Pramod P. Pearlean Brownie, MD  Fax: [phone]

## 2010-10-06 NOTE — Op Note (Signed)
NAMELISSETT, FAVORITE NO.:  [id number]   MEDICAL RECORD NO.:  [id number]                  PATIENT TYPE:   LOCATION:                                       FACILITY:   PHYSICIAN:  Sigmund I. Patsi Sears, M.D.         DATE OF BIRTH:   DATE OF PROCEDURE:  05/10/2003  DATE OF DISCHARGE:                                 OPERATIVE REPORT   PREOPERATIVE DIAGNOSIS:  Left ureteropelvic junction obstruction.   POSTOPERATIVE DIAGNOSIS:  Left ureteropelvic junction obstruction.   OPERATION/PROCEDURE:  Accusize endopyelotomy.   SURGEON:  Sigmund I. Patsi Sears, M.D.   ANESTHESIA:  General LMA.   PREPARATION:  Preanesthesia. The patient is brought to the operating room,  placed on the operating table in the dorsal supine position where general  LMA anesthesia was introduced.  She was then re-placed in the dorsal  lithotomy position where the pubis was prepped with Betadine solution and  draped in the usual fashion.   REVIEW OF HISTORY:  This 77 year old female has an incidental left UPJ  obstruction, with CT arteriogram showing anterior placement of an aberrant  artery.  She has high-grade obstruction with excellent cortex.  She is now  for Accusize endopyelotomy.   DESCRIPTION OF PROCEDURE:  The double-J catheter is identified as previously  placed and removed over a guide wire.  The Accusize was then placed over the  guide wire, retrograde pyelogram was performed which shows the UPJ  obstruction.  The Accusize business end is then placed over the UPJ and  inflated with 2.5 mL balloon.  A five-second endopyelotomy is then  accomplished and 10-minute balloon dilation time was accomplished after  that.  A small amount of extravasation was noted around the endopyelotomy.  Incision was made at the posterolateral portion of the UPJ.  Following this,  the Accusize was removed, and an endopyelotomy stent was placed without  difficulty.  The patient was given B&O  suppository at the beginning of the  case, and 30 mg of Toradol IV at the end of the procedure.  She tolerated  the procedure well, was awakened and taken to the recovery room in good  condition.                                               Sigmund I. Patsi Sears, M.D.    SIT/MEDQ  D:  05/10/2003  T:  05/10/2003  Job:  [id number]

## 2010-10-06 NOTE — Consult Note (Signed)
NAMETRISHNA, CWIK               ACCOUNT NO.:  [id number]   MEDICAL RECORD NO.:  [id number]          PATIENT TYPE:  INP   LOCATION:  3112                         FACILITY:  [facility]   PHYSICIAN:  Pramod P. Pearlean Brownie, MD    DATE OF BIRTH:  [birth year]   DATE OF CONSULTATION:  09/14/2005  DATE OF DISCHARGE:                                   CONSULTATION   REASON FOR CONSULTATION:  Stroke.   HISTORY OF PRESENT ILLNESS:  Mrs. Sloma is a 76 year old pleasant Caucasian  lady who was admitted electively for right sphenoid meningeal resection  which was done eventfully on September 12, 2005.  Apparently there was no  manipulation of the middle cerebral artery during this procedure.  The  patient did well postoperatively initially.  However, yesterday she had some  left hand weakness.  This morning she had prolonged left-sided weakness.  An  MRI scan of the brain was obtained which showed low density in the right  middle cerebral artery and , temporal lobe consistent with recent infarct.  MRA showed complete occlusion of the right middle cerebral artery.  The  patient has no known prior history of stroke, TIA, seizures, or significant  neurological problems.  No significant vascular risk factors except ischemic  heart disease.   PAST MEDICAL HISTORY:  Ischemic heart disease.   PAST SURGICAL HISTORY:  1.  Hysterectomy.  2.  Tonsillectomy.  3.  Left kidney surgery.   SOCIAL HISTORY:  The patient is married.  Lives with her husband.  She does  not smoke or drink.   ALLERGIES:  PENICILLIN, CEPHALOSPORIN, LACTOSE INTOLERANCE.   FAMILY HISTORY:  Nonsignificant for any strokes.   PHYSICAL EXAMINATION:  GENERAL:  Pleasant, middle-aged Caucasian lady.  VITAL SIGNS:  Afebrile.  Pulse rate 72 per minute, regular. __________ .  Blood pressure 140/90.  HEAD:  Post surgical with right craniotomy defect and surgical staples seen.  There is edema and swelling of the right eyelid and infraorbital region  consistent with this surgery.  CARDIAC:  Regular heart sounds.  LUNGS:  Clear to auscultation and percussion.  NECK:  No bruit.  ABDOMEN:  Soft, nontender.  NEUROLOGIC:  The patient is pleasant, awake, alert, oriented x3.  Normal  speech and language function.  She has full range of eye movement but there  is dense left homonymous hemianopsia.  There is mild weakness of the left  lower face.  There is complete dense hemiplegia on the right left side.  Left upper extremity is flaccid with 0/5 strength.  Left lower extremity  strength is 2/5. Deep tendon reflexes are +2 on the left.  The left plantar  is upgoing, right is downgoing.  Coordination is impaired on the left.  Gait  was not tested.   LABORATORY DATA:  CT scan of the head done today reveals __________ density  in the right temporal and parietal region consistent with subacute stroke.  The right __________.  Hyperdensity is noted in the right middle cerebral  distribution which is surgical clip in the large feeding vessel into the  tumor. The patient had  an angiogram done today by Dr. Corliss Skains revealed  complete occlusion of the right middle cerebral artery in terminal end  portion.  There is some pial collateral flow to the distal MCA distribution.  Previous preoperative MRI date April 2006 showed a large sphenoid wing  meningioma on the right with displacement of the middle cerebral artery  branches but patent flow in the right middle cerebral artery.   IMPRESSION:  A 76 year old lady who right middle cerebral artery inferior  division infarction in the temporal parietal lobe, likely secondary to  embolic occlusion of the distal right middle cerebral artery.  Exact  etiology uncertain at the present time.  Even though this happened in the  postoperative period, there was no direct manipulation of the right middle  cerebral artery as per Dr. Jeral Fruit.  Hence, it will be necessary to look for  other etiologies for the clot.  We  will plan to check 2-D echocardiogram,  carotid Dopplers, lower extremity venous Doppler, fasting lipid profile, and  see how much this has changed.  Physical, occupational and speech therapy  consult.  The patient will benefit from going to rehab.  We had a long  discussion with the patient and her family members with regards to her  prognosis, and answered questions.  Recommend Aggrenox for second stroke  prevention for now.   Thank you for the referral. .           ______________________________  Sunny Schlein. Pearlean Brownie, MD     PPS/MEDQ  D:  09/14/2005  T:  09/16/2005  Job:  [id number]

## 2010-10-06 NOTE — Assessment & Plan Note (Signed)
Tuesday, April 23, 2006   This is a 76 year old female with a prior history of CVA after a  meningioma. Has had problems with initially left hip pain, now more its  been on the right buttock area, diagnosed with sacroiliac disorder. Sent  to physical therapy, started on Skelaxin and she has had a great  improvement in her pain. She had some ischial bursitis, hamstring spasm  as well.   The patient has finished her therapy and feels much better now. She is  somewhat irregular with her home exercise program performance. She can  walk 10-15 minutes at a time, climbs steps and drives. She has still  some residual numbness and burning-type pain on the left side,  hypersensitivity to touch.   MEDICATIONS:  She takes Darvocet now maybe once a week. She is out of  Skelaxin for four days, but no rebound in tenderness since that.   Her blood pressure is 103/32, pulse 61, respirations 16, O2 sat 100% on  room air. In general in no distress. Mood and affect appropriate. Gait  shows no evidence of toe drag or knee instability. She has no tenderness  over the ischial bursa and no tenderness over the trochanteric bursa, no  tenderness over the PSIS. Her fabere's test shows some tightness of the  hips, but otherwise no pain in the SI area.   IMPRESSION:  1. 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  2. Left trochanteric bursitis, improved.   PLAN:  1. Continue intermittent Darvocet.  2. Continue home exercise program.   I will see her back in three months.      Erick Colace, M.D.  Electronically Signed     AEK/MedQ  D:  04/23/2006 13:29:52  T:  04/23/2006 14:46:51  Job #:  [id number]

## 2010-10-06 NOTE — Discharge Summary (Signed)
NAME:  Samantha Hudson, Samantha Hudson                         ACCOUNT NO.:  [id number]   MEDICAL RECORD NO.:  [id number]                   PATIENT TYPE:  INP   LOCATION:  0367                                 [facility]ACILITY[facility]  [facility]   PHYSICIAN:  Sigmund I. Patsi Sears, M.D.         DATE OF BIRTH:  09-27-1934   DATE OF ADMISSION:  11/15/2003  DATE OF DISCHARGE:  11/18/2003                                 DISCHARGE SUMMARY   DISCHARGE DIAGNOSIS:  Left ureteropelvic junction obstruction.   HISTORY OF PRESENT ILLNESS:  Samantha Hudson is a 76 year old female with a  history of left UPJ obstruction, hydronephrosis and history of microscopic  hematuria with flank pain.  She is status post endopyelotomy for her UPJ,  but with current flank pain.  A repeat CT scan and Lasix renal scan was  ordered demonstrating continuing hydronephrosis and the renogram also  confirms obstruction.  Dr. Patsi Sears reviewed x-rays and case with Britta Mccreedy  and they decided that an open pyeloplasty would be the best course of action  for her.   PAST MEDICAL HISTORY:  1. Status post hysterectomy in 1970s.  2. Status post cysto, left ureteroscopy and endopyelotomy.  3. Status post laser treatment of the left eye.  4. Lactose intolerance.  5. Hypertension.  6. Hyperlipidemia.  7. Coronary artery disease status post percutaneous revascularization in     2001.   HOSPITAL COURSE:  Patient was taken to the operating room where a left  pyeloplasty was performed (see operative note).  She was then taken to  recovery room and eventually sent to [facility] [place].  Her postoperative course was  relatively uneventful.  She had a Marcaine pump in her wound and pain was  very well controlled.  She ambulated on day #2 and day #3.  She was able to  void without difficulty after Foley catheter was removed and patient  progressed satisfactorily.   DISCHARGE MEDICATIONS:  1. Percocet p.r.n. pain.  2. Cipro.  3. Continue home medications.  4. Xanax for some  anxiety.   CONDITION ON DISCHARGE:  Stable.   PLAN:  Patient will follow up with Dr. Patsi Sears and Terri Piedra, Nurse  Practitioner, next week for staple removal.  She will continue to follow up  with Dr. Patsi Sears over the next several weeks status post her surgery.     Terri Piedra, N.P.                         Sigmund I. Patsi Sears, M.D.    HB/MEDQ  D:  11/19/2003  T:  11/19/2003  Job:  [id number]

## 2010-10-06 NOTE — Op Note (Signed)
NAME:  Samantha Hudson, Samantha Hudson                         ACCOUNT NO.:  [id number]   MEDICAL RECORD NO.:  [id number]                   PATIENT TYPE:  INP   LOCATION:  0006                                 FACILITY:  [facility]   PHYSICIAN:  Sigmund I. Patsi Sears, M.D.         DATE OF BIRTH:  April 07, 1935   DATE OF PROCEDURE:  DATE OF DISCHARGE:                                 OPERATIVE REPORT   PREOPERATIVE DIAGNOSIS:  Left ureteropelvic junction obstruction.   POSTOPERATIVE DIAGNOSIS:  Left ureteropelvic junction obstruction.   PROCEDURE:  Left dismembered pyeloplasty.   ATTENDING SURGEON:  Sigmund I. Patsi Sears, M.D.   ASSISTANT:  Valetta Fuller, M.D., Rhae Lerner, M.D.   ANESTHESIA:  General endotracheal anesthesia.   COMPLICATIONS:  None.   INDICATIONS FOR PROCEDURE:  Ms. Cadden is a 76 year old female who on  evaluation for left flank pain was found to have a left ureteropelvic  junction obstruction.  Ms. Roa has previously undergone an endopyelotomy;  however, following this procedure, she continued to complain of pain and on  repeat CT scan, was noted to have persistent hydronephrosis.  A radiogram  was subsequent performed, and this showed obstruction at the  left UPJ.  After discussing the implications of ureteropelvic junction obstruction and  the options for management, including chronic indwelling stent versus open  repair versus repeat endopyelotomy, the patient has decided to proceed with  open repair of the UPJ obstruction.   DESCRIPTION OF PROCEDURE:  The patient was brought to the operating room and  following induction of general endotracheal anesthesia, was placed in a left  flank position and prepped and draped in the usual sterile fashion.  An  incision was subsequently performed from a point immediately overlying the  left 12th rib in the posterior axillary line to a point overlying the  lateral edge of the left rectus muscle.  The dissection was subsequently  carried down through the external oblique and internal oblique and their  fascia.  Finally, the transversalis muscle was divided, and the  retroperitoneal space entered.  The distal 4-5 cm of the 12th rib was  dissected away from the surrounding tissues and removed using a bone cutter  to divide the bone and Bovie cautery to remove any surrounding adherent  tissues.  Once the retroperitoneal space had been entered, the left kidney  was easily identified.  The planes were subsequently developed within the  retroperitoneal space both anterior and posterior to Gerota's fascia as well  as near the superior pole of the kidney.  During blunt dissection  superiorly, there was noted to be a small tear in the peritoneum overlying  the spleen.  This was easily repaired with a running 3-0 chromic suture.  Gerota's fascia was subsequently opened laterally and the perinephric fat  carefully dissected away inferiorly and medially until the left ureter,  along with the left renal pelvis, had been identified.  The ureter was  demarcated with  a vessel loop.  At this point, an accessory renal artery was  also noted to be crossing over the left ureter just distal to the  ureteropelvic junction.  This artery was also dissected away from the  investing perinephric fat and fascial layers and identified with a vessel  loop.  The left ureter was subsequently transected just distal to the  ureteropelvic junction, and the pelvis itself divided at a point just  proximal to the ureteropelvic junction.  This specimen containing the  narrowed UPJ, was subsequently removed from the field and sent to pathology.  At this point, the ureter was spatulated along its antimesenteric border.  The ureter was subsequently re-anastomosed to the remaining left renal  pelvis using 5-0 PDS in a running fashion.  Prior to completing the  anastomosis, a 6 x 24 double-J ureteral stent was placed from the bladder up  into the left renal  pelvis.  Once the anastomosis was complete, a Penrose  drain was placed through a separate incision just inferior to the primary  incision.  Attention was then returned to the crossing accessory vessel.  It  was felt that after dissecting this all out, it was quite loose and not  causing any obstruction at the level of the ureteropelvic junction.  A small  amount of renal fat was subsequently interposed between accessory renal  artery and the renal pelvis.  At this point, closure was initiated.  The  wound was closed in two layers using #1 PDS to close first the internal  oblique fascia followed by the external oblique fascia.  A Marcaine pump was  subsequently placed to the superficial layers of the wound and the skin then  closed with skin staples.  The patient was subsequently allowed to awaken  and the case was ended.  The patient tolerated the procedure well, and there  were no complications.   Dr. Patsi Sears was present for the entire case and presented in all aspects  of the surgical procedure.     Bailey Mech, MD                        Sigmund I. Patsi Sears, M.D.    JP/MEDQ  D:  11/15/2003  T:  11/15/2003  Job:  [id number]

## 2010-10-06 NOTE — H&P (Signed)
NAME:  Samantha Hudson, Samantha Hudson                         ACCOUNT NO.:  [id number]   MEDICAL RECORD NO.:  [id number]                   PATIENT TYPE:  INP   LOCATION:  0006                                 FACILITY:  [facility]   PHYSICIAN:  Bailey Mech, MD                  DATE OF BIRTH:  19-Sep-1934   DATE OF ADMISSION:  11/15/2003  DATE OF DISCHARGE:                                HISTORY & PHYSICAL   CHIEF COMPLAINT:  Left UPJ obstruction.   HISTORY OF PRESENT ILLNESS:  Samantha Hudson is a 76 year old female with a  history of left UPJ obstruction, hydronephrosis, and a history of  microscopic hematuria and flank pain.  She is status post endopyelotomy for  her UPJ but with her current left flank pain, a repeat CT scan was ordered  which demonstrated continuing hydronephrosis and a renogram which  demonstrates obstruction.  A long discussion was subsequently held with Mrs.  Hudson concerning her clinical situation and given the fact that  endopyelotomy has failed previously, we have recommended that she proceed  with open dismembered pyeloplasty.   REVIEW OF SYMPTOMS:  No fever or chills, nausea or vomiting, shortness of  breath, chest pain, diarrhea or constipation.  All other systems are  negative except as above.   PAST MEDICAL HISTORY:  1. Status post hysterectomy in the 1970s.  2. Status post cysto, left ureteroscopy and endopyelotomy.  3. Status post laser treatment of left eye.  4. The patient also has lactose intolerance.  5. Hypertension.  6. Hyperlipidemia.  7. Coronary artery disease status post percutaneous revascularization in     2001.   SOCIAL HISTORY:  The patient denies tobacco, although she does drink  occasional alcohol.   FAMILY HISTORY:  No known history of GU malignancy.   ALLERGIES:  PENICILLIN causes hives.   MEDICATIONS:  Atacand, aspirin, atenolol, Lipitor, Paxil.   PHYSICAL EXAMINATION:  GENERAL APPEARANCE:  No acute distress.  Alert and  oriented to person,  place and time.  HEENT:  Pupils are equal, round and reactive to light.  Extraocular  movements intact.  NECK:  No masses.  LUNGS:  Clear to auscultation bilaterally.  CARDIOVASCULAR:  Regular rate and rhythm without murmurs.  ABDOMEN:  Soft, nontender, nondistended with bowel sounds positive.  EXTREMITIES:  No clubbing, cyanosis, or edema.  NEUROLOGIC:  Cranial nerves II-XII grossly intact.  SKIN:  No lesions.   ASSESSMENT/PLAN:  A 76 year old female with left ureteropelvic junction  obstruction.  We will admit Samantha Hudson and take her to the operating room,  at which time we will perform an open left dismembered pyeloplasty.                                               Bailey Mech, MD  JP/MEDQ  D:  11/15/2003  T:  11/15/2003  Job:  [id number]

## 2010-10-06 NOTE — Discharge Summary (Signed)
NAMEAVANA, Samantha Hudson NO.:  [id number]   MEDICAL RECORD NO.:  [id number]          PATIENT TYPE:  INP   LOCATION:  3106                         FACILITY:  [facility]   PHYSICIAN:  Hilda Lias, M.D.   DATE OF BIRTH:  [birth year]   DATE OF ADMISSION:  09/12/2005  DATE OF DISCHARGE:  09/19/2005                                 DISCHARGE SUMMARY   ADMITTING DIAGNOSES:  Right middle fossa meningioma.   FINAL DIAGNOSES:  1.  Right middle fossa meningioma.  2.  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   CLINICAL HISTORY:  Patient was admitted because of large tumor in the right  middle fossa.  The patient was having some headache and some mild weakness.  Because of the findings she was advised to have surgery.   LABORATORIES:  Normal.   HOSPITAL COURSE:  The patient was taken to surgery and a bifrontal  craniotomy was performed.  The tumor was removed completely.  The patient  did well.  She spent the night in PACU because no ICD beds.  The day after  she was transferred to 3500.  Patient was awake, oriented, moving all four  extremities.  48 hours later when I came to see her I found that she was  completely hemiplegic on the left side.  Emergency angiogram was performed  and it was found that she had emboli.  We called the stroke team.  Unfortunately, it was too late to do any kind of treatment.  The patient  remained in the intensive care unit and she is much better right now.  She  is awake.  She is fully in command and she started regaining function in the  left upper extremity and the left leg.  Mentally she is stable.  Because she  is doing really well she is going to be transferred to the rehabilitation  center to be followed by [place].   CONDITION ON DISCHARGE:  Improvement from the point of view of the  hemiplegia.   _____ medication the same.   FOLLOW-UP:  I will continue [name] while she is in the hospital and of  course later on in my  office.           ______________________________  Hilda Lias, M.D.     EB/MEDQ  D:  09/19/2005  T:  09/20/2005  Job:  [id number]

## 2010-10-06 NOTE — Procedures (Signed)
   NAME:  Samantha Hudson, Samantha Hudson NO.:  [id number]   MEDICAL RECORD NO.:  [id number]                   PATIENT TYPE:  PREC   LOCATION:                                       FACILITY:   PHYSICIAN:  Vida Roller, M.D.                DATE OF BIRTH:  10-Feb-1935   DATE OF PROCEDURE:  DATE OF DISCHARGE:                                    STRESS TEST   PROCEDURE:  Exercise Cardiolite   INDICATIONS:  This patient is a 76 year old female with known coronary  artery disease, status post percutaneous coronary intervention in 2001 at  [facility] for a study.  She had a stent placed in her  left inferior descending artery. She had a relook catheterization in 2002  which showed nonobstructive disease and no restenosis with normal LV  function.  She now presents with atypical chest discomfort.   BASELINE DATA:  EKG shows sinus rhythm at 60 beats/minute with nonspecific  ST abnormalities.  Blood pressure is 128/70.   The patient exercised for a total of 8 minutes 54 seconds to 10.1 METs and  Bruce protocol stage 3.  The speed of the treadmill was decreased after  about 1 minute in stage 3 secondary to shortness of breath and fatigue.  Maximum heart rate achieved was 117 beats/minute which is 75% of predicted  maximum.  Maximum blood pressure was 138/76.  EKG showed a few PACs and ST  depression in the inferolateral leads which resolved in recovery.   Final images and results are pending MD review.     Amy Mercy Riding, P.A. [facility]                     Vida Roller, M.D.    AB/MEDQ  D:  10/29/2002  T:  10/29/2002  Job:  [id number]

## 2010-10-06 NOTE — Procedures (Signed)
   NAME:  JANEAH, KOVACICH                         ACCOUNT NO.:  [id number]   MEDICAL RECORD NO.:  [id number]                   PATIENT TYPE:  OUT   LOCATION:  RAD                                  FACILITY:  [facility]   PHYSICIAN:  Vida Roller, M.D.                DATE OF BIRTH:  January 28, 1935   DATE OF PROCEDURE:  09/29/2002  DATE OF DISCHARGE:                                  ECHOCARDIOGRAM   TAPE NUMBER:  [id number]   TAPE COUNT:  [id number]   CLINICAL INFORMATION:  The patient is a 76 year old woman with chest pain.  Technical quality of the study is adequate for interpretation.   PREVIOUS STUDY:  @@.   M-MODE:  AORTA:  31 mm  (<4.0)  LEFT ATRIUM:  22 mm  (<4.0)  SEPTUM:  9 mm  (0.7-1.1)  POSTERIOR WALL:  9 mm  (0.7-1.1)  LV-DIASTOLE:  35 mm  (<5.7)  LV-SYSTOLE:  22 mm  (<4.0)  E-SEPTAL:  @@  (<0.5)  RV-DIASTOLE:  @@  (<2.5)  IVC:  @@  (<2.0)   TWO-DIMENSIONAL AND DOPPLER IMAGING:  The left ventricle is small but has  normal left ventricular function; there are no obvious wall motion  abnormalities; diastolic function was not assessed.   The right ventricle was normal size with normal systolic function.   Both atria are normal size.  No atrial septal defect is seen.   The aortic valve is slightly sclerotic with no stenosis or regurgitation.   The mitral valve is morphologically unremarkable with trace insufficiency;  no stenosis is seen.   The tricuspid valve is morphologically unremarkable with trace  insufficiency; no stenosis is seen.   The pulmonic valve is not well seen.   The ascending aorta appears to be slightly enlarged distal to the sinuses of  Valsalva, this is not well seen on multiple views; there is a small  insignificant pericardial effusion seen but it appears not to have any  hemodynamic compromise.   The inferior vena cava is normal size.                                               Vida Roller, M.D.    JH/MEDQ  D:  09/29/2002  T:  09/30/2002   Job:  [id number]

## 2010-10-06 NOTE — Assessment & Plan Note (Signed)
[facility]                              CARDIOLOGY OFFICE NOTE   NAME:Samantha Hudson                      MRN:          [id number]  DATE:02/15/2006                            DOB:          11-29-1934    IDENTIFICATION:  The patient is a 76 year old woman with a history of known  coronary artery disease status post PTCA/stent of the LAD at [facility] in 2002.  She was last seen in cardiology clinic back in June.   At the time when she was seen, she was having some chest discomfort although  the question was if it was musculoskeletal.  She denied any stress testing  at the time given her recent CVA and concern of exacerbating that.   In the interval, she has done well.  She is continuing on with rehab,  progressing well.  Her endurance and stamina are better. She is breathing  okay.  Denies chest pain.  She thinks the episode she had that day was when  she went on the elliptical machine which was new for her.  She says she had  some upper extremity weakness which is longstanding.   CURRENT MEDICATIONS:  1. Lipitor 10.  2. Atenolol 25 b.i.d.  3. Aggrenox b.i.d.  4. Lyrica 50 b.i.d.  5. Celexa 10 daily ?   PHYSICAL EXAMINATION:  On exam, the patient is in no distress.  Blood  pressure 104/52, pulse is 72 and regular, weight 111.  NECK:  No bruits.  LUNGS:  Clear.  CARDIAC EXAM:  Regular rate and rhythm, S1 S2, no S3, no murmurs.  ABDOMEN:  Benign.  EXTREMITIES:  1-2+ pulses.  No lower extremity edema.   IMPRESSION:  1. Coronary artery disease, clinically doing well, increasing her activity      without a problem.  I would continue to follow on medical therapy.      Most likely the chest pain this summer was musculoskeletal.  2. History of cerebrovascular accident after surgery for meningioma.      Seems to be getting her strength back.  Is working with Dr. Wynn Banker      in rehab and by her report progressing well.  3. Dyslipidemia.  Lipid panel  done back in June was excellent with an LDL      of 76, HDL 49.  Would continue.   I will set to see the patient back in about 6-8 months, sooner if problems  develop.  Continue on current regimen.            ______________________________  Pricilla Riffle, MD, [facility]     PVR/MedQ  DD:  02/15/2006  DT:  02/17/2006  Job #:  [id number]   cc:   Lorin Picket A. Gerda Diss, MD  Pramod P. Pearlean Brownie, MD  Erick Colace, M.D.

## 2010-10-06 NOTE — Op Note (Signed)
   NAME:  Samantha Hudson, Samantha Hudson                         ACCOUNT NO.:  [id number]   MEDICAL RECORD NO.:  [id number]                   PATIENT TYPE:  AMB   LOCATION:  NESC                                 FACILITY:  [facility]   PHYSICIAN:  Sigmund I. Patsi Sears, M.D.         DATE OF BIRTH:  [birth year]   DATE OF PROCEDURE:  02/22/2003  DATE OF DISCHARGE:                                 OPERATIVE REPORT   PREOPERATIVE DIAGNOSIS:  Left ureteropelvic junction obstruction.   POSTOPERATIVE DIAGNOSIS:  Left ureteropelvic junction obstruction.   OPERATION:  1. Cystourethroscopy.  2. Left retrograde pyelogram.  3. Left double J catheter.   SURGEON:  Sigmund I. Patsi Sears, M.D.   ANESTHESIA:  General LMA.   PREPARATION:  After appropriate preanesthesia, the patient is brought to the  operating room and placed on the operating table in the dorsal supine  position, where general LMA anesthesia was introduced.  She was then  replaced in a dorsal lithotomy position, where the pubis was prepped with  Betadine solution and draped in the usual fashion.   REVIEW OF HISTORY:  This 76 year old female was found on chest CT for vague  chest pain to have a severe left UPJ obstruction.  The patient is now for  further evaluation of her UPJ.   DESCRIPTION OF PROCEDURE:  Cystourethroscopy was accomplished and shows a  normal-appearing bladder with normal bladder base.  The orifices are normal.  Retrograde pyelogram revealed a normal ureter with classic UPJ obstruction.  Photo documentation is accomplished and a double J catheter is passed into  the dilated renal pelvis coiled in the renal pelvis and the bladder.  Photo  documentation is accomplished.  The patient is awakened and taken to the  recovery room in good condition.  She is given Toradol and a B&O  suppository.                                               Sigmund I. Patsi Sears, M.D.    SIT/MEDQ  D:  02/22/2003  T:  02/22/2003  Job:  [id number]   cc:    Esau Grew  [address]  [place]  [place] [address]  Fax: [phone]

## 2010-11-14 ENCOUNTER — Other Ambulatory Visit: Payer: Self-pay | Admitting: Family Medicine

## 2010-11-14 ENCOUNTER — Ambulatory Visit (HOSPITAL_COMMUNITY)
Admission: RE | Admit: 2010-11-14 | Discharge: 2010-11-14 | Disposition: A | Discharge status: Home / Self Care | Payer: Medicare Other | Source: Ambulatory Visit | Attending: Family Medicine | Admitting: Family Medicine

## 2010-11-14 DIAGNOSIS — M546 Pain in thoracic spine: Secondary | ICD-10-CM | POA: Insufficient documentation

## 2010-11-14 DIAGNOSIS — M899 Disorder of bone, unspecified: Secondary | ICD-10-CM | POA: Insufficient documentation

## 2010-11-14 DIAGNOSIS — M549 Dorsalgia, unspecified: Secondary | ICD-10-CM

## 2010-11-14 DIAGNOSIS — M949 Disorder of cartilage, unspecified: Secondary | ICD-10-CM | POA: Insufficient documentation

## 2010-12-27 ENCOUNTER — Ambulatory Visit: Payer: Medicare Other | Admitting: Ophthalmology

## 2011-01-09 ENCOUNTER — Ambulatory Visit: Payer: Medicare Other | Admitting: Ophthalmology

## 2011-04-09 ENCOUNTER — Other Ambulatory Visit: Payer: Self-pay | Admitting: Family Medicine

## 2011-04-09 DIAGNOSIS — Z139 Encounter for screening, unspecified: Secondary | ICD-10-CM

## 2011-05-17 ENCOUNTER — Ambulatory Visit (HOSPITAL_COMMUNITY)
Admission: RE | Admit: 2011-05-17 | Discharge: 2011-05-17 | Disposition: A | Discharge status: Home / Self Care | Payer: Medicare Other | Source: Ambulatory Visit | Attending: Family Medicine | Admitting: Family Medicine

## 2011-05-17 DIAGNOSIS — Z139 Encounter for screening, unspecified: Secondary | ICD-10-CM

## 2011-05-17 DIAGNOSIS — Z1231 Encounter for screening mammogram for malignant neoplasm of breast: Secondary | ICD-10-CM | POA: Insufficient documentation

## 2011-07-04 ENCOUNTER — Other Ambulatory Visit: Payer: Self-pay | Admitting: Family Medicine

## 2011-07-05 ENCOUNTER — Other Ambulatory Visit (HOSPITAL_COMMUNITY): Payer: Medicare Other

## 2011-07-06 ENCOUNTER — Ambulatory Visit (HOSPITAL_COMMUNITY)
Admission: RE | Admit: 2011-07-06 | Discharge: 2011-07-06 | Disposition: A | Discharge status: Home / Self Care | Payer: Medicare Other | Source: Ambulatory Visit | Attending: Family Medicine | Admitting: Family Medicine

## 2011-07-06 ENCOUNTER — Other Ambulatory Visit: Payer: Self-pay | Admitting: Family Medicine

## 2011-07-06 DIAGNOSIS — R1032 Left lower quadrant pain: Secondary | ICD-10-CM | POA: Insufficient documentation

## 2011-07-06 DIAGNOSIS — M949 Disorder of cartilage, unspecified: Secondary | ICD-10-CM | POA: Insufficient documentation

## 2011-07-06 DIAGNOSIS — M546 Pain in thoracic spine: Secondary | ICD-10-CM | POA: Insufficient documentation

## 2011-07-06 DIAGNOSIS — K5909 Other constipation: Secondary | ICD-10-CM | POA: Insufficient documentation

## 2011-07-06 DIAGNOSIS — M899 Disorder of bone, unspecified: Secondary | ICD-10-CM | POA: Insufficient documentation

## 2011-07-06 MED ORDER — IOHEXOL 300 MG/ML  SOLN
100.0000 mL | Freq: Once | INTRAMUSCULAR | Status: AC | PRN
  Administered 2011-07-06: 100 mL via INTRAVENOUS

## 2011-07-11 ENCOUNTER — Other Ambulatory Visit: Payer: Self-pay | Admitting: Family Medicine

## 2011-07-16 ENCOUNTER — Ambulatory Visit (HOSPITAL_COMMUNITY)
Admission: RE | Admit: 2011-07-16 | Discharge: 2011-07-16 | Disposition: A | Discharge status: Home / Self Care | Payer: Medicare Other | Source: Ambulatory Visit | Attending: Family Medicine | Admitting: Family Medicine

## 2011-07-16 DIAGNOSIS — M818 Other osteoporosis without current pathological fracture: Secondary | ICD-10-CM | POA: Insufficient documentation

## 2011-07-16 DIAGNOSIS — Z780 Asymptomatic menopausal state: Secondary | ICD-10-CM | POA: Insufficient documentation

## 2011-07-24 ENCOUNTER — Other Ambulatory Visit: Payer: Self-pay | Admitting: Family Medicine

## 2011-07-24 ENCOUNTER — Ambulatory Visit (HOSPITAL_COMMUNITY)
Admission: RE | Admit: 2011-07-24 | Discharge: 2011-07-24 | Disposition: A | Discharge status: Home / Self Care | Payer: Medicare Other | Source: Ambulatory Visit | Attending: Family Medicine | Admitting: Family Medicine

## 2011-07-24 DIAGNOSIS — R937 Abnormal findings on diagnostic imaging of other parts of musculoskeletal system: Secondary | ICD-10-CM | POA: Insufficient documentation

## 2011-07-24 DIAGNOSIS — M25539 Pain in unspecified wrist: Secondary | ICD-10-CM

## 2011-07-24 DIAGNOSIS — S59909A Unspecified injury of unspecified elbow, initial encounter: Secondary | ICD-10-CM | POA: Insufficient documentation

## 2011-07-24 DIAGNOSIS — S6990XA Unspecified injury of unspecified wrist, hand and finger(s), initial encounter: Secondary | ICD-10-CM | POA: Insufficient documentation

## 2011-07-24 DIAGNOSIS — W19XXXA Unspecified fall, initial encounter: Secondary | ICD-10-CM | POA: Insufficient documentation

## 2011-07-24 DIAGNOSIS — S59919A Unspecified injury of unspecified forearm, initial encounter: Secondary | ICD-10-CM | POA: Insufficient documentation

## 2011-08-07 ENCOUNTER — Ambulatory Visit (INDEPENDENT_AMBULATORY_CARE_PROVIDER_SITE_OTHER): Payer: Medicare Other | Admitting: Gastroenterology

## 2011-08-07 ENCOUNTER — Encounter: Payer: Self-pay | Admitting: Gastroenterology

## 2011-08-07 ENCOUNTER — Telehealth: Payer: Self-pay | Admitting: *Deleted

## 2011-08-07 DIAGNOSIS — R1310 Dysphagia, unspecified: Secondary | ICD-10-CM | POA: Insufficient documentation

## 2011-08-07 DIAGNOSIS — K5900 Constipation, unspecified: Secondary | ICD-10-CM | POA: Insufficient documentation

## 2011-08-07 MED ORDER — PEG-KCL-NACL-NASULF-NA ASC-C 100 G PO SOLR: 1.0000 | Freq: Once | ORAL | Status: DC

## 2011-08-07 NOTE — Assessment & Plan Note (Signed)
Rule out peptic esophageal stricture.  Recommendations #1 upper endoscopy with dilatation as indicated 

## 2011-08-07 NOTE — Progress Notes (Signed)
History of Present Illness: Samantha Hudson is a 77 year old white female with history of CVA, coronary artery disease and IBS, referred at the request of Dr. Gerda Diss for evaluation of dysphagia and constipation. Constipation has been a problem for years although clearly worsened over the last couple of years. She now can only move her bowels with the help of MiraLax. She has some abdominal discomfort with constipation. She denies rectal bleeding. Last colonoscopy was about 10 years ago. She also complains of dysphagia to solids. She denies pyrosis or odynophagia.  The patient is on Aggrenox    Past Medical History  Diagnosis Date  . CAD (coronary artery disease)   . Hyperlipemia   . IBS (irritable bowel syndrome)   . CVA (cerebral vascular accident)   . Osteoporosis   . Fibromyalgia   . Glaucoma   . Globus hystericus    Past Surgical History  Procedure Date  . Brain surgery     tumor removed  . Tonsillectomy   . Partial hysterectomy   . Glaucoma repair   . Cataract extraction   . Kidney surgery     left   family history includes Breast cancer in her sister; Coronary artery disease in her mother; and Liver disease in her father. Current Outpatient Prescriptions  Medication Sig Dispense Refill  . ALPRAZolam (XANAX) 0.5 MG tablet Take 0.5 mg by mouth as needed.      Marland Kitchen atenolol (TENORMIN) 25 MG tablet Take 25 mg by mouth 2 (two) times daily.      Marland Kitchen atorvastatin (LIPITOR) 10 MG tablet Take 10 mg by mouth daily.      Marland Kitchen dipyridamole-aspirin (AGGRENOX) 25-200 MG per 12 hr capsule Take 1 capsule by mouth 2 (two) times daily.      . DULoxetine (CYMBALTA) 30 MG capsule Take 30 mg by mouth daily.      Marland Kitchen gabapentin (NEURONTIN) 400 MG capsule Take 400 mg by mouth. Take 1 three times daily and 2 before bedtime      . Lactase (LACTAID FAST ACT PO) Take 1 tablet by mouth as needed.      Marland Kitchen oxybutynin (DITROPAN-XL) 5 MG 24 hr tablet Take 5 mg by mouth daily.      . polyethylene glycol powder  (GLYCOLAX/MIRALAX) powder Take 17 g by mouth daily.      . ranitidine (ZANTAC) 150 MG tablet Take 150 mg by mouth as needed.      . traMADol (ULTRAM) 50 MG tablet Take 50 mg by mouth as needed.       Allergies as of 08/07/2011 - Review Complete 08/07/2011  Allergen Reaction Noted  . Lactose intolerance (gi)  08/07/2011  . Penicillins  08/07/2011    reports that she has never smoked. She has never used smokeless tobacco. She reports that she does not drink alcohol or use illicit drugs.     Review of Systems: She complains of pain in her hand and lower extremity. Pertinent positive and negative review of systems were noted in the above HPI section. All other review of systems were otherwise negative.  Vital signs were reviewed in today's medical record Physical Exam: General: Well developed , well nourished, no acute distress Head: Normocephalic and atraumatic Eyes:  sclerae anicteric, EOMI Ears: Normal auditory acuity Mouth: No deformity or lesions Neck: Supple, no masses or thyromegaly Lungs: Clear throughout to auscultation Heart: Regular rate and rhythm; no murmurs, rubs or bruits Abdomen: Soft, non tender and non distended. No masses, hepatosplenomegaly or hernias noted. Normal  Bowel sounds Rectal:deferred Musculoskeletal: Symmetrical with no gross deformities  Skin: No lesions on visible extremities Pulses:  Normal pulses noted Extremities: No clubbing, cyanosis, edema or deformities noted Neurological: Alert oriented x 4, grossly nonfocal Cervical Nodes:  No significant cervical adenopathy Inguinal Nodes: No significant inguinal adenopathy Psychological:  Alert and cooperative. Normal mood and affect

## 2011-08-07 NOTE — Patient Instructions (Signed)
We will contact Dr Milas Gain about holding your Aggrenox Colonoscopy A colonoscopy is an exam to evaluate your entire colon. In this exam, your colon is cleansed. A long fiberoptic tube is inserted through your rectum and into your colon. The fiberoptic scope (endoscope) is a long bundle of enclosed and very flexible fibers. These fibers transmit light to the area examined and send images from that area to your caregiver. Discomfort is usually minimal. You may be given a drug to help you sleep (sedative) during or prior to the procedure. This exam helps to detect lumps (tumors), polyps, inflammation, and areas of bleeding. Your caregiver may also take a small piece of tissue (biopsy) that will be examined under a microscope. LET YOUR CAREGIVER KNOW ABOUT:   Allergies to food or medicine.   Medicines taken, including vitamins, herbs, eyedrops, over-the-counter medicines, and creams.   Use of steroids (by mouth or creams).   Previous problems with anesthetics or numbing medicines.   History of bleeding problems or blood clots.   Previous surgery.   Other health problems, including diabetes and kidney problems.   Possibility of pregnancy, if this applies.  BEFORE THE PROCEDURE   A clear liquid diet may be required for 2 days before the exam.   Ask your caregiver about changing or stopping your regular medications.   Liquid injections (enemas) or laxatives may be required.   A large amount of electrolyte solution may be given to you to drink over a short period of time. This solution is used to clean out your colon.   You should be present 60 minutes prior to your procedure or as directed by your caregiver.  AFTER THE PROCEDURE   If you received a sedative or pain relieving medication, you will need to arrange for someone to drive you home.   Occasionally, there is a little blood passed with the first bowel movement. Do not be concerned.  FINDING OUT THE RESULTS OF YOUR TEST Not all  test results are available during your visit. If your test results are not back during the visit, make an appointment with your caregiver to find out the results. Do not assume everything is normal if you have not heard from your caregiver or the medical facility. It is important for you to follow up on all of your test results. HOME CARE INSTRUCTIONS   It is not unusual to pass moderate amounts of gas and experience mild abdominal cramping following the procedure. This is due to air being used to inflate your colon during the exam. Walking or a warm pack on your belly (abdomen) may help.   You may resume all normal meals and activities after sedatives and medicines have worn off.   Only take over-the-counter or prescription medicines for pain, discomfort, or fever as directed by your caregiver. Do not use aspirin or blood thinners if a biopsy was taken. Consult your caregiver for medicine usage if biopsies were taken.  SEEK IMMEDIATE MEDICAL CARE IF:   You have a fever.   You pass large blood clots or fill a toilet with blood following the procedure. This may also occur 10 to 14 days following the procedure. This is more likely if a biopsy was taken.   You develop abdominal pain that keeps getting worse and cannot be relieved with medicine.  Document Released: 05/04/2000 Document Revised: 04/26/2011 Document Reviewed: 12/18/2007 [facility] Patient Information 2012 [place], [place].  Upper GI Endoscopy Upper GI endoscopy means using a flexible scope to look at  the esophagus, stomach, and upper small bowel. This is done to make a diagnosis in people with heartburn, abdominal pain, or abnormal bleeding. Sometimes an endoscope is needed to remove foreign bodies or food that become stuck in the esophagus; it can also be used to take biopsy samples. For the best results, do not eat or drink for 8 hours before having your upper endoscopy.  To perform the endoscopy, you will probably be sedated and your  throat will be numbed with a special spray. The endoscope is then slowly passed down your throat (this will not interfere with your breathing). An endoscopy exam takes 15 to 30 minutes to complete and there is no real pain. Patients rarely remember much about the procedure. The results of the test may take several days if a biopsy or other test is taken.  You may have a sore throat after an endoscopy exam. Serious complications are very rare. Stick to liquids and soft foods until your pain is better. Do not drive a car or operate any dangerous equipment for at least 24 hours after being sedated. SEEK IMMEDIATE MEDICAL CARE IF:   You have severe throat pain.   You have shortness of breath.   You have bleeding problems.   You have a fever.   You have difficulty recovering from your sedation.  Document Released: 06/14/2004 Document Revised: 04/26/2011 Document Reviewed: 05/09/2008 [facility] Patient Information 2012 [place], [place].

## 2011-08-07 NOTE — Telephone Encounter (Signed)
[facility] [address] [place] [address] [phone] phone [phone] Fax 08/07/2011    RE: Samantha Hudson DOB: 1934-11-03 MRN: [id number]   Dear  Dr Rip Harbour,    We have scheduled the above patient for an endoscopic procedure. Our records show that she is on anticoagulation therapy.   Please advise as to how long the patient may come off her therapy of Aggrenox  prior to the procedure, which is scheduled for  08/23/2011 .  Please fax back/ or route the completed form to Jedd Schulenburg at [phone].   Sincerely,  Merri Ray

## 2011-08-07 NOTE — Assessment & Plan Note (Addendum)
Constipation is probably functional. A structure abnormality of the colon should be ruled out.  Recommendations #1 colonoscopy. I will check with her PCP to determine whether Aggrenox can be held.

## 2011-08-08 ENCOUNTER — Telehealth: Payer: Self-pay | Admitting: Gastroenterology

## 2011-08-08 NOTE — Telephone Encounter (Signed)
PER NOTE FAXED BACK FROM DR Lilyan Punt IT IS OK FOR PATIENT TO HOLD AGGRENOX 7 DAYS BEFORE HER PROCEDURE PATIENT AWARE

## 2011-08-10 ENCOUNTER — Ambulatory Visit (HOSPITAL_COMMUNITY): Payer: Medicare Other

## 2011-08-10 NOTE — Telephone Encounter (Signed)
Answered all questions about Linzess Tols patient to call back as needed

## 2011-08-20 ENCOUNTER — Telehealth: Payer: Self-pay | Admitting: Gastroenterology

## 2011-08-20 NOTE — Telephone Encounter (Signed)
Patient had questions regarding her prep and medications Answered all questions and reminded patient about her Aggrenox. Patient is already holding the Aggrenox Told patient to call us back is she has any further questions

## 2011-08-23 ENCOUNTER — Ambulatory Visit (AMBULATORY_SURGERY_CENTER): Payer: Medicare Other | Admitting: Gastroenterology

## 2011-08-23 ENCOUNTER — Encounter: Payer: Self-pay | Admitting: Gastroenterology

## 2011-08-23 VITALS — BP 141/64 | HR 66 | Temp 97.6°F | Resp 14 | Ht 65.5 in | Wt 101.0 lb

## 2011-08-23 DIAGNOSIS — K5900 Constipation, unspecified: Secondary | ICD-10-CM

## 2011-08-23 DIAGNOSIS — K5730 Diverticulosis of large intestine without perforation or abscess without bleeding: Secondary | ICD-10-CM

## 2011-08-23 DIAGNOSIS — R1310 Dysphagia, unspecified: Secondary | ICD-10-CM

## 2011-08-23 DIAGNOSIS — K222 Esophageal obstruction: Secondary | ICD-10-CM

## 2011-08-23 MED ORDER — SODIUM CHLORIDE 0.9 % IV SOLN: 500.0000 mL | INTRAVENOUS | Status: DC

## 2011-08-23 NOTE — Op Note (Signed)
[facility] [address] [place], [place]  [address]  ENDOSCOPY PROCEDURE REPORT  PATIENT:  Samantha Hudson, Samantha Hudson  MR#:  [id number] BIRTHDATE:  October 25, 1934, 76 yrs. old  GENDER:  female  ENDOSCOPIST:  Barbette Hair. Arlyce Dice, MD Referred by:  Lilyan Punt, M.D.  PROCEDURE DATE:  08/23/2011 PROCEDURE:  EGD, diagnostic 43235, Maloney Dilation of Esophagus ASA CLASS:  Class II INDICATIONS:  dysphagia  MEDICATIONS:   There was residual sedation effect present from prior procedure., MAC sedation, administered by CRNA propofol 50mg IV, 0.6cc simethancone 0.6 cc PO TOPICAL ANESTHETIC:  DESCRIPTION OF PROCEDURE:   After the risks and benefits of the procedure were explained, informed consent was obtained.  The LB GIF-H180 [id number] endoscope was introduced through the mouth and advanced to the third portion of the duodenum.  The instrument was slowly withdrawn as the mucosa was fully examined. <<PROCEDUREIMAGES>>  A stricture was found at the gastroesophageal junction (see image2). Early stricture Dilation with maloney dilator 18mm Mild resistence; minimal heme  Otherwise the examination was normal (see image3, image4, and image5).    Retroflexed views revealed no abnormalities.    The scope was then withdrawn from the patient and the procedure completed.  COMPLICATIONS:  None  ENDOSCOPIC IMPRESSION: 1) Stricture at the gastroesophageal junction 2) Otherwise normal examination RECOMMENDATIONS: 1) dilatations as needed 2) resume aggrenox in 3 days  ______________________________ Barbette Hair. Arlyce Dice, MD  CC:  n. eSIGNED:   Barbette Hair. Ata Pecha at 08/23/2011 11:13 AM  Vivien Rossetti, [phone]

## 2011-08-23 NOTE — Progress Notes (Signed)
Patient did not have preoperative order for IV antibiotic SSI prophylaxis. (G8918)  Patient did not experience any of the following events: a burn prior to discharge; a fall within the facility; wrong site/side/patient/procedure/implant event; or a hospital transfer or hospital admission upon discharge from the facility. (G8907)  

## 2011-08-23 NOTE — Progress Notes (Signed)
Spoke with pt. At 2:15pm this afternoon, and she stated that he rpain was now a #3.  She said that she took her tylenol, and it helped.  I told her to take it around the clock just for the next 24 hrs.  She is to call us if the pain worsens.

## 2011-08-23 NOTE — Patient Instructions (Addendum)
Resume aggrenox in 3 daysYOU HAD AN ENDOSCOPIC PROCEDURE TODAY AT THE [facility]: Refer to the procedure report that was given to you for any specific questions about what was found during the examination.  If the procedure report does not answer your questions, please call your gastroenterologist to clarify.  If you requested that your care partner not be given the details of your procedure findings, then the procedure report has been included in a sealed envelope for you to review at your convenience later.  YOU SHOULD EXPECT: Some feelings of bloating in the abdomen. Passage of more gas than usual.  Walking can help get rid of the air that was put into your GI tract during the procedure and reduce the bloating. If you had a lower endoscopy (such as a colonoscopy or flexible sigmoidoscopy) you may notice spotting of blood in your stool or on the toilet paper. If you underwent a bowel prep for your procedure, then you may not have a normal bowel movement for a few days.  DIET: Your first meal following the procedure should be a light meal and then it is ok to progress to your normal diet.  A half-sandwich or bowl of soup is an example of a good first meal.  Heavy or fried foods are harder to digest and may make you feel nauseous or bloated.  Likewise meals heavy in dairy and vegetables can cause extra gas to form and this can also increase the bloating.  Drink plenty of fluids but you should avoid alcoholic beverages for 24 hours.  ACTIVITY: Your care partner should take you home directly after the procedure.  You should plan to take it easy, moving slowly for the rest of the day.  You can resume normal activity the day after the procedure however you should NOT DRIVE or use heavy machinery for 24 hours (because of the sedation medicines used during the test).    SYMPTOMS TO REPORT IMMEDIATELY: A gastroenterologist can be reached at any hour.  During normal business hours, 8:30 AM to 5:00 PM  Monday through Friday, call [phone].  After hours and on weekends, please call the GI answering service at [phone] who will take a message and have the physician on call contact you.   Following lower endoscopy (colonoscopy or flexible sigmoidoscopy):  Excessive amounts of blood in the stool  Significant tenderness or worsening of abdominal pains  Swelling of the abdomen that is new, acute  Fever of 100F or higher  Following upper endoscopy (EGD)  Vomiting of blood or coffee ground material  New chest pain or pain under the shoulder blades  Painful or persistently difficult swallowing  New shortness of breath  Fever of 100F or higher  Black, tarry-looking stools  FOLLOW UP: If any biopsies were taken you will be contacted by phone or by letter within the next 1-3 weeks.  Call your gastroenterologist if you have not heard about the biopsies in 3 weeks.  Our staff will call the home number listed on your records the next business day following your procedure to check on you and address any questions or concerns that you may have at that time regarding the information given to you following your procedure. This is a courtesy call and so if there is no answer at the home number and we have not heard from you through the emergency physician on call, we will assume that you have returned to your regular daily activities without incident.  SIGNATURES/CONFIDENTIALITY:  You and/or your care partner have signed paperwork which will be entered into your electronic medical record.  These signatures attest to the fact that that the information above on your After Visit Summary has been reviewed and is understood.  Full responsibility of the confidentiality of this discharge information lies with you and/or your care-partner.

## 2011-08-23 NOTE — Op Note (Addendum)
[facility] [address] [place], [place]  [address]  COLONOSCOPY PROCEDURE REPORT  PATIENT:  Samantha, Hudson  MR#:  [id number] BIRTHDATE:  [birth year], 76 yrs. old  GENDER:  female ENDOSCOPIST:  Barbette Hair. Arlyce Dice, MD REF. BY:  Lilyan Punt, M.D. PROCEDURE DATE:  08/23/2011 PROCEDURE:  Diagnostic Colonoscopy ASA CLASS:  Class II INDICATIONS:  constipation MEDICATIONS:   MAC sedation, administered by CRNA propofol 150mg IV  DESCRIPTION OF PROCEDURE:   After the risks benefits and alternatives of the procedure were thoroughly explained, informed consent was obtained.  Digital rectal exam was performed and revealed no abnormalities.   The LB PCF-H180AL [id number] endoscope was introduced through the anus and advanced to the cecum, which was identified by both the appendix and ileocecal valve, without limitations.  The quality of the prep was good, using MoviPrep. The instrument was then slowly withdrawn as the colon was fully examined. <<PROCEDUREIMAGES>>  FINDINGS:  Scattered diverticula were found in the descending colon (see image1).  This was otherwise a normal examination of the colon (see image2 and image5).   Retroflexed views in the rectum revealed no abnormalities.    The time to cecum =  1) 6.02) 9.25  minutes. The scope was then withdrawn in  1) 6.0  minutes from the cecum and the procedure completed. COMPLICATIONS:  None ENDOSCOPIC IMPRESSION: 1) Diverticula, scattered in the descending colon 2) Otherwise normal examination RECOMMENDATIONS: 1) Call office for follow-up appointment in 4 weeks 2) miralax as needed for constipation REPEAT EXAM:  No  ______________________________ Barbette Hair. Arlyce Dice, MD  CC:  n. REVISED:  08/23/2011 11:17 AM eSIGNED:   Barbette Hair. Rea Reser at 08/23/2011 11:17 AM  Vivien Rossetti, [phone]

## 2011-08-23 NOTE — Progress Notes (Signed)
Pressure applied to the abdomen to reach the cecum 

## 2011-08-24 ENCOUNTER — Telehealth: Payer: Self-pay | Admitting: *Deleted

## 2011-08-24 NOTE — Telephone Encounter (Signed)
  Follow up Call-  Call back number 08/23/2011  Post procedure Call Back phone  # [phone]  Permission to leave phone message Yes     Patient questions:  Do you have a fever, pain , or abdominal swelling? no Pain Score  1 *  Have you tolerated food without any problems? yes  Have you been able to return to your normal activities? yes  Do you have any questions about your discharge instructions: Diet   no Medications  no Follow up visit  no  Do you have questions or concerns about your Care? no  Actions: * If pain score is 4 or above: No action needed, pain <4.  Pt. States throat is "a little sore".  Able to swallow without problem, tolerates diet.

## 2011-09-24 ENCOUNTER — Encounter: Payer: Self-pay | Admitting: Gastroenterology

## 2011-09-24 ENCOUNTER — Ambulatory Visit (INDEPENDENT_AMBULATORY_CARE_PROVIDER_SITE_OTHER): Payer: Medicare Other | Admitting: Gastroenterology

## 2011-09-24 DIAGNOSIS — K5900 Constipation, unspecified: Secondary | ICD-10-CM

## 2011-09-24 DIAGNOSIS — R1310 Dysphagia, unspecified: Secondary | ICD-10-CM

## 2011-09-24 DIAGNOSIS — K222 Esophageal obstruction: Secondary | ICD-10-CM

## 2011-09-24 NOTE — Patient Instructions (Signed)
Follow up as needed

## 2011-09-24 NOTE — Progress Notes (Signed)
History of Present Illness: Mr. Samantha Hudson has returned following endoscopy and colonoscopy. A distal esophageal stricture was dilated. Diverticula were seen at colonoscopy. Her dysphagia has subsided. She is moving her bowels regularly. She has no GI complaints except for very rare mild hurting discomfort in her lower chest when she lays down.    Past Medical History  Diagnosis Date  . CAD (coronary artery disease)   . Hyperlipemia   . IBS (irritable bowel syndrome)   . CVA (cerebral vascular accident)   . Osteoporosis   . Fibromyalgia   . Glaucoma   . Globus hystericus    Past Surgical History  Procedure Date  . Brain surgery     tumor removed  . Tonsillectomy   . Partial hysterectomy   . Glaucoma repair   . Cataract extraction   . Kidney surgery     left   family history includes Breast cancer in her sister; Coronary artery disease in her mother; and Liver disease in her father. Current Outpatient Prescriptions  Medication Sig Dispense Refill  . ALPRAZolam (XANAX) 0.5 MG tablet Take 0.5 mg by mouth as needed.      Marland Kitchen atenolol (TENORMIN) 25 MG tablet Take 25 mg by mouth 2 (two) times daily.      Marland Kitchen atorvastatin (LIPITOR) 10 MG tablet Take 10 mg by mouth daily.      Marland Kitchen dipyridamole-aspirin (AGGRENOX) 25-200 MG per 12 hr capsule Take 1 capsule by mouth 2 (two) times daily.      . DULoxetine (CYMBALTA) 30 MG capsule Take 30 mg by mouth daily.      Marland Kitchen gabapentin (NEURONTIN) 400 MG capsule Take 400 mg by mouth. Take 1 three times daily and 2 before bedtime      . Lactase (LACTAID FAST ACT PO) Take 1 tablet by mouth as needed.      Marland Kitchen oxybutynin (DITROPAN-XL) 5 MG 24 hr tablet Take 5 mg by mouth daily.      . polyethylene glycol powder (GLYCOLAX/MIRALAX) powder Take 17 g by mouth daily.      . ranitidine (ZANTAC) 150 MG tablet Take 150 mg by mouth as needed.      . traMADol (ULTRAM) 50 MG tablet Take 50 mg by mouth as needed.       Allergies as of 09/24/2011 - Review Complete 09/24/2011    Allergen Reaction Noted  . Lactose intolerance (gi)  08/07/2011  . Penicillins  08/07/2011    reports that she has never smoked. She has never used smokeless tobacco. She reports that she does not drink alcohol or use illicit drugs.     Review of Systems: Pertinent positive and negative review of systems were noted in the above HPI section. All other review of systems were otherwise negative.  Vital signs were reviewed in today's medical record Physical Exam: General: Well developed , well nourished, no acute distress

## 2011-09-24 NOTE — Assessment & Plan Note (Signed)
This is a minimal problem for which she takes MiraLax as needed.

## 2011-09-24 NOTE — Assessment & Plan Note (Signed)
Resolved following dilatation therapy 

## 2012-04-03 ENCOUNTER — Other Ambulatory Visit: Payer: Self-pay | Admitting: Family Medicine

## 2012-04-03 DIAGNOSIS — Z139 Encounter for screening, unspecified: Secondary | ICD-10-CM

## 2012-05-19 ENCOUNTER — Ambulatory Visit (HOSPITAL_COMMUNITY)
Admission: RE | Admit: 2012-05-19 | Discharge: 2012-05-19 | Disposition: A | Discharge status: Home / Self Care | Payer: Medicare Other | Source: Ambulatory Visit | Attending: Family Medicine | Admitting: Family Medicine

## 2012-05-19 DIAGNOSIS — Z139 Encounter for screening, unspecified: Secondary | ICD-10-CM

## 2012-05-19 DIAGNOSIS — Z1231 Encounter for screening mammogram for malignant neoplasm of breast: Secondary | ICD-10-CM | POA: Insufficient documentation

## 2012-06-11 ENCOUNTER — Encounter: Payer: Self-pay | Admitting: Gastroenterology

## 2012-07-05 ENCOUNTER — Emergency Department (HOSPITAL_COMMUNITY): Payer: Medicare Other

## 2012-07-05 ENCOUNTER — Encounter (HOSPITAL_COMMUNITY): Payer: Self-pay | Admitting: Emergency Medicine

## 2012-07-05 ENCOUNTER — Emergency Department (HOSPITAL_COMMUNITY)
Admission: EM | Admit: 2012-07-05 | Discharge: 2012-07-05 | Disposition: A | Discharge status: Home / Self Care | Payer: Medicare Other | Attending: Emergency Medicine | Admitting: Emergency Medicine

## 2012-07-05 DIAGNOSIS — H409 Unspecified glaucoma: Secondary | ICD-10-CM | POA: Insufficient documentation

## 2012-07-05 DIAGNOSIS — I2510 Atherosclerotic heart disease of native coronary artery without angina pectoris: Secondary | ICD-10-CM | POA: Insufficient documentation

## 2012-07-05 DIAGNOSIS — S52501A Unspecified fracture of the lower end of right radius, initial encounter for closed fracture: Secondary | ICD-10-CM

## 2012-07-05 DIAGNOSIS — Z9849 Cataract extraction status, unspecified eye: Secondary | ICD-10-CM | POA: Insufficient documentation

## 2012-07-05 DIAGNOSIS — Y939 Activity, unspecified: Secondary | ICD-10-CM | POA: Insufficient documentation

## 2012-07-05 DIAGNOSIS — E785 Hyperlipidemia, unspecified: Secondary | ICD-10-CM | POA: Insufficient documentation

## 2012-07-05 DIAGNOSIS — Z8719 Personal history of other diseases of the digestive system: Secondary | ICD-10-CM | POA: Insufficient documentation

## 2012-07-05 DIAGNOSIS — S52599A Other fractures of lower end of unspecified radius, initial encounter for closed fracture: Secondary | ICD-10-CM | POA: Insufficient documentation

## 2012-07-05 DIAGNOSIS — Z8673 Personal history of transient ischemic attack (TIA), and cerebral infarction without residual deficits: Secondary | ICD-10-CM | POA: Insufficient documentation

## 2012-07-05 DIAGNOSIS — W010XXA Fall on same level from slipping, tripping and stumbling without subsequent striking against object, initial encounter: Secondary | ICD-10-CM | POA: Insufficient documentation

## 2012-07-05 DIAGNOSIS — Z8659 Personal history of other mental and behavioral disorders: Secondary | ICD-10-CM | POA: Insufficient documentation

## 2012-07-05 DIAGNOSIS — Z7982 Long term (current) use of aspirin: Secondary | ICD-10-CM | POA: Insufficient documentation

## 2012-07-05 DIAGNOSIS — Y929 Unspecified place or not applicable: Secondary | ICD-10-CM | POA: Insufficient documentation

## 2012-07-05 DIAGNOSIS — Z79899 Other long term (current) drug therapy: Secondary | ICD-10-CM | POA: Insufficient documentation

## 2012-07-05 DIAGNOSIS — Z8739 Personal history of other diseases of the musculoskeletal system and connective tissue: Secondary | ICD-10-CM | POA: Insufficient documentation

## 2012-07-05 MED ORDER — HYDROCODONE-ACETAMINOPHEN 5-325 MG PO TABS: 2.0000 | ORAL_TABLET | ORAL | Status: DC | PRN

## 2012-07-05 MED ORDER — NAPROXEN 500 MG PO TABS: 500.0000 mg | ORAL_TABLET | Freq: Two times a day (BID) | ORAL | Status: DC

## 2012-07-05 MED ORDER — NAPROXEN 250 MG PO TABS
500.0000 mg | ORAL_TABLET | Freq: Once | ORAL | Status: AC
  Administered 2012-07-05: 500 mg via ORAL
  Filled 2012-07-05: qty 2

## 2012-07-05 NOTE — ED Provider Notes (Signed)
History     CSN: [id number]  Arrival date & time 07/05/12  1637   First MD Initiated Contact with Patient 07/05/12 1903      Chief Complaint  Patient presents with  . Fall    (Consider location/radiation/quality/duration/timing/severity/associated sxs/prior treatment) HPI Comments: 78 year old female who presents with right wrist pain after she fell after losing her balance. She fell on her outstretched hand with her hand forced into dorsi flexion. She felt a pop and acute onset of pain. She then developed gradual worsening swelling which has been persistent. She has not had any pain medications, she is not placed ice on her injury but has had persistent symptoms for the last 2 days since the accident occurred. This is worse with movement of the wrist and palpation of her distal forearm. He denies any numbness or weakness of her fingers  Patient is a 78 y.o. female presenting with fall. The history is provided by the patient and the spouse.  Fall Pertinent negatives include no fever, no numbness and no vomiting.    Past Medical History  Diagnosis Date  . CAD (coronary artery disease)   . Hyperlipemia   . IBS (irritable bowel syndrome)   . CVA (cerebral vascular accident)   . Osteoporosis   . Fibromyalgia   . Glaucoma(365)   . Globus hystericus     Past Surgical History  Procedure Laterality Date  . Brain surgery      tumor removed  . Tonsillectomy    . Partial hysterectomy    . Glaucoma repair    . Cataract extraction    . Kidney surgery      left    Family History  Problem Relation Age of Onset  . Liver disease Father   . Coronary artery disease Mother     deseased  . Breast cancer Sister     x 2    History  Substance Use Topics  . Smoking status: Never Smoker   . Smokeless tobacco: Never Used  . Alcohol Use: No    OB History   Grav Para Term Preterm Abortions TAB SAB Ect Mult Living                  Review of Systems  Constitutional: Negative for  fever.  Gastrointestinal: Negative for vomiting.  Musculoskeletal: Positive for joint swelling.  Skin: Negative for wound.       Laceration  Neurological: Negative for weakness and numbness.    Allergies  Lactose intolerance (gi) and Penicillins  Home Medications   Current Outpatient Rx  Name  Route  Sig  Dispense  Refill  . ALPRAZolam (XANAX) 0.5 MG tablet   Oral   Take 0.5 mg by mouth as needed.         Marland Kitchen atenolol (TENORMIN) 25 MG tablet   Oral   Take 25 mg by mouth 2 (two) times daily.         Marland Kitchen atorvastatin (LIPITOR) 10 MG tablet   Oral   Take 10 mg by mouth daily.         Marland Kitchen dipyridamole-aspirin (AGGRENOX) 25-200 MG per 12 hr capsule   Oral   Take 1 capsule by mouth 2 (two) times daily.         . DULoxetine (CYMBALTA) 30 MG capsule   Oral   Take 30 mg by mouth daily.         Marland Kitchen gabapentin (NEURONTIN) 400 MG capsule   Oral   Take 400 mg  by mouth. Take 1 three times daily and 2 before bedtime         . HYDROcodone-acetaminophen (NORCO/VICODIN) 5-325 MG per tablet   Oral   Take 2 tablets by mouth every 4 (four) hours as needed for pain.   10 tablet   0   . Lactase (LACTAID FAST ACT PO)   Oral   Take 1 tablet by mouth as needed.         . naproxen (NAPROSYN) 500 MG tablet   Oral   Take 1 tablet (500 mg total) by mouth 2 (two) times daily with a meal.   30 tablet   0   . oxybutynin (DITROPAN-XL) 5 MG 24 hr tablet   Oral   Take 5 mg by mouth daily.         . polyethylene glycol powder (GLYCOLAX/MIRALAX) powder   Oral   Take 17 g by mouth daily.         . ranitidine (ZANTAC) 150 MG tablet   Oral   Take 150 mg by mouth as needed.         . traMADol (ULTRAM) 50 MG tablet   Oral   Take 50 mg by mouth as needed.           BP 123/56  Pulse 71  Temp(Src) 98.1 F (36.7 C)  Resp 18  Ht 5' 5.5" (1.664 m)  Wt 100 lb (45.36 kg)  BMI 16.38 kg/m2  SpO2 100%  Physical Exam  Nursing note and vitals reviewed. Constitutional: She  appears well-developed and well-nourished. No distress.  HENT:  Head: Normocephalic and atraumatic.  Eyes: Conjunctivae are normal. No scleral icterus.  Cardiovascular: Normal rate, regular rhythm and intact distal pulses.   Normal pulses at the radial artery on the right, normal capillary refill of the fingers  Pulmonary/Chest: Effort normal and breath sounds normal.  Musculoskeletal: She exhibits tenderness ( distal right forearm with tenderness over the radius. Decreased range of motion of the right wrist, the patient is able to flex and extend all fingers of the right hand). She exhibits no edema.  Neurological: She is alert.  Sensation to light touch and pinprick  Skin: Skin is warm and dry. No rash noted. She is not diaphoretic.    ED Course  Procedures (including critical care time)  Labs Reviewed - No data to display Dg Wrist Complete Right  07/05/2012  *RADIOLOGY REPORT*  Clinical Data: status post fall.  Wrist pain  RIGHT WRIST - COMPLETE 3+ VIEW  Comparison: 07/24/2011  Findings: There is non displaced and slightly impacted fracture of the distal radius.  No subluxation identified.  No radio-opaque foreign bodies or soft tissue calcifications.  IMPRESSION: Nondisplaced and slightly impacted fracture of the distal radius.   Original Report Authenticated By: Signa Kell, M.D.      1. Distal radius fracture, right, closed, initial encounter       MDM  I personally evaluated the x-rays of her right wrist. There appears to be a buckle fracture of the right wrist. This is small, nondisplaced and is amenable to splinting and followup with orthopedics. The patient will be given anti-inflammatories, rice therapy, splint and sling, followup. She is able to move her elbow and shoulder on the right without any difficulty. I doubt other fractures based on reassuring clinical exam  Naprosyn\Vicodin Referral to hand        Vida Roller, MD 07/05/12 [phone]

## 2012-07-05 NOTE — ED Notes (Signed)
Pt slipped and fell Thursday morning. Pt c/o pain in right wrist and shoulder. Denies hitting head/loc.

## 2012-07-16 ENCOUNTER — Other Ambulatory Visit (HOSPITAL_COMMUNITY): Payer: Self-pay | Admitting: Family Medicine

## 2012-07-16 DIAGNOSIS — R531 Weakness: Secondary | ICD-10-CM

## 2012-07-23 ENCOUNTER — Ambulatory Visit (HOSPITAL_COMMUNITY)
Admission: RE | Admit: 2012-07-23 | Discharge: 2012-07-23 | Disposition: A | Discharge status: Home / Self Care | Payer: Medicare Other | Source: Ambulatory Visit | Attending: Family Medicine | Admitting: Family Medicine

## 2012-07-23 DIAGNOSIS — R5381 Other malaise: Secondary | ICD-10-CM | POA: Insufficient documentation

## 2012-07-23 DIAGNOSIS — G9389 Other specified disorders of brain: Secondary | ICD-10-CM | POA: Insufficient documentation

## 2012-07-23 DIAGNOSIS — G319 Degenerative disease of nervous system, unspecified: Secondary | ICD-10-CM | POA: Insufficient documentation

## 2012-07-23 DIAGNOSIS — R269 Unspecified abnormalities of gait and mobility: Secondary | ICD-10-CM | POA: Insufficient documentation

## 2012-07-23 MED ORDER — GADOBENATE DIMEGLUMINE 529 MG/ML IV SOLN
9.0000 mL | Freq: Once | INTRAVENOUS | Status: AC | PRN
  Administered 2012-07-23: 9 mL via INTRAVENOUS

## 2012-08-09 ENCOUNTER — Other Ambulatory Visit: Payer: Self-pay

## 2012-08-09 MED ORDER — ATORVASTATIN CALCIUM 10 MG PO TABS: 10.0000 mg | ORAL_TABLET | Freq: Every day | ORAL | Status: DC

## 2012-08-09 MED ORDER — ATENOLOL 25 MG PO TABS: 25.0000 mg | ORAL_TABLET | Freq: Two times a day (BID) | ORAL | Status: DC

## 2012-08-09 MED ORDER — GABAPENTIN 400 MG PO CAPS: 400.0000 mg | ORAL_CAPSULE | Freq: Four times a day (QID) | ORAL | Status: DC

## 2012-08-09 MED ORDER — SOLIFENACIN SUCCINATE 5 MG PO TABS: 5.0000 mg | ORAL_TABLET | Freq: Every day | ORAL | Status: DC

## 2012-08-09 MED ORDER — DULOXETINE HCL 30 MG PO CPEP: 30.0000 mg | ORAL_CAPSULE | Freq: Every day | ORAL | Status: DC

## 2012-09-25 ENCOUNTER — Telehealth: Payer: Self-pay | Admitting: Family Medicine

## 2012-09-25 ENCOUNTER — Ambulatory Visit (INDEPENDENT_AMBULATORY_CARE_PROVIDER_SITE_OTHER): Payer: Medicare Other | Admitting: Family Medicine

## 2012-09-25 ENCOUNTER — Encounter: Payer: Self-pay | Admitting: Family Medicine

## 2012-09-25 VITALS — BP 108/58 | Temp 98.4°F | Wt 100.8 lb

## 2012-09-25 DIAGNOSIS — K5900 Constipation, unspecified: Secondary | ICD-10-CM

## 2012-09-25 NOTE — Progress Notes (Signed)
  Subjective:    Patient ID: Samantha Hudson, female    DOB: 15-Apr-1935, 78 y.o.   MRN: [id number]  HPI  Having discomfort from exercise. Then experienced spasms in parts of the body. Hurting now in chest wall. Worse with certain motions. Worse with pressure. Dr Amanda Pea now working with hx of stress fx of left sacrum. Exercising for this. Takes tramadol  Some hx of constipation, worse lately. Full sensation.   Review of Systems Review systems otherwise negative.    Objective:   Physical Exam  Alert mild malaise. Lungs clear. Heart regular in rhythm. Chest wall some tenderness to palpation. Abdomen generally benign. Some pain along left sacrum.      Assessment & Plan:  Impression status post left sacral fracture. #2 worsening constipation. #3 chest and abdominal pain. Plan double of MiraLAX. Use Ultram as needed. Patient may use Aleve as needed for pain. Reluctant because of history of stroke. Patient to followup with Dr. Lorin Picket. WSL

## 2012-09-25 NOTE — Telephone Encounter (Signed)
error 

## 2012-09-25 NOTE — Patient Instructions (Signed)
Two aleave tabs twice per day with food. Increase tramadol to four times per year. increase miralax to twice per day

## 2012-10-02 ENCOUNTER — Ambulatory Visit (INDEPENDENT_AMBULATORY_CARE_PROVIDER_SITE_OTHER): Payer: Medicare Other | Admitting: Family Medicine

## 2012-10-02 ENCOUNTER — Encounter: Payer: Self-pay | Admitting: Family Medicine

## 2012-10-02 VITALS — BP 90/48 | Temp 98.3°F

## 2012-10-02 DIAGNOSIS — R400 Somnolence: Secondary | ICD-10-CM

## 2012-10-02 DIAGNOSIS — S3210XD Unspecified fracture of sacrum, subsequent encounter for fracture with routine healing: Secondary | ICD-10-CM

## 2012-10-02 DIAGNOSIS — S3210XA Unspecified fracture of sacrum, initial encounter for closed fracture: Secondary | ICD-10-CM | POA: Insufficient documentation

## 2012-10-02 DIAGNOSIS — R404 Transient alteration of awareness: Secondary | ICD-10-CM

## 2012-10-02 NOTE — Progress Notes (Signed)
  Subjective:    Patient ID: Samantha Hudson, female    DOB: 01/11/1935, 78 y.o.   MRN: [id number]  HPI This patient presents today having a hard time getting around because of severe back pain. Hurts to stand up hurts to move hurts to rotate. In addition to this she has a stress fracture the sacrum for which the orthopedist was handling. She takes Neurontin several times a day for pain associated with previous stroke she also takes tramadol recently has been very somnolent. She also has a history of hyperlipidemia.   Review of Systems Denies chest tightness pressure pain denies unilateral numbness or weakness new onset.    Objective:   Physical Exam Vital signs are stable. Lungs are clear. Heart is regular subjective discomfort lower back some edema in the ankles not severe. Patient is wheelchair-bound.       Assessment & Plan:  Sacral fracture-we will get a copy of the MRI sent to [place] in addition to this we also recommend that the patient have home physical therapy. We will work on this. We will get the MRI results to review. I'll see the patient back next week to see how she is doing fair recommend reducing her Neurontin 3 times a day to see if that improves the somnolence I think the medication and her acting with the tramadol. Patient was encouraged to have good oral intake.

## 2012-10-02 NOTE — Patient Instructions (Signed)
Reduce neurontin to 3 times a day  Aleve one 2 times a day

## 2012-10-08 ENCOUNTER — Ambulatory Visit (INDEPENDENT_AMBULATORY_CARE_PROVIDER_SITE_OTHER): Payer: Medicare Other | Admitting: Family Medicine

## 2012-10-08 ENCOUNTER — Encounter: Payer: Self-pay | Admitting: Family Medicine

## 2012-10-08 VITALS — BP 98/57 | HR 80 | Wt 102.8 lb

## 2012-10-08 DIAGNOSIS — R300 Dysuria: Secondary | ICD-10-CM

## 2012-10-08 DIAGNOSIS — Z79899 Other long term (current) drug therapy: Secondary | ICD-10-CM

## 2012-10-08 DIAGNOSIS — R5381 Other malaise: Secondary | ICD-10-CM

## 2012-10-08 LAB — CBC WITH DIFFERENTIAL/PLATELET
Basophils Relative: 0 % (ref 0–1)
Eosinophils Absolute: 0.3 10*3/uL (ref 0.0–0.7)
Eosinophils Relative: 4 % (ref 0–5)
Hemoglobin: 11.1 g/dL — ABNORMAL LOW (ref 12.0–15.0)
Lymphs Abs: 1.3 10*3/uL (ref 0.7–4.0)
MCH: 29.3 pg (ref 26.0–34.0)
MCHC: 33.5 g/dL (ref 30.0–36.0)
MCV: 87.3 fL (ref 78.0–100.0)
Monocytes Absolute: 0.7 10*3/uL (ref 0.1–1.0)
Monocytes Relative: 10 % (ref 3–12)
Neutrophils Relative %: 69 % (ref 43–77)
RBC: 3.79 MIL/uL — ABNORMAL LOW (ref 3.87–5.11)

## 2012-10-08 LAB — POCT URINALYSIS DIPSTICK: Spec Grav, UA: 1.01

## 2012-10-08 MED ORDER — GABAPENTIN 100 MG PO CAPS: ORAL_CAPSULE | ORAL | Status: DC

## 2012-10-08 MED ORDER — ASPIRIN-DIPYRIDAMOLE ER 25-200 MG PO CP12: 1.0000 | ORAL_CAPSULE | Freq: Two times a day (BID) | ORAL | Status: DC

## 2012-10-08 NOTE — Patient Instructions (Signed)
Food diary [id number]

## 2012-10-08 NOTE — Progress Notes (Signed)
  Subjective:    Patient ID: Samantha Hudson, female    DOB: 1935/05/20, 78 y.o.   MRN: [id number]  HPIThis patient has a rather complex history. She has sacrum stress fracture she's been followed by orthopedics Dr. Amanda Pea t. She relates severe pain when she tries to stand or walk so therefore she is not doing any walking. She's not moving any other than sitting in a chair sometimes sleeping in a chair not sleeping well at night because of frequent urination in addition to this she denies sweats chills nausea vomiting fevers diarrhea.She denies any particular problemsWith her breathing.. Past medical history osteoporosis, low weight, heart disease, history of stroke,Hyperlipidemia. Family history noncontributory Please see med list. Reviewed. Review of Systems    She denies dysuria but does relate urinary frequency at nighttime. Not sleeping well at night finds himself fatigued tired during the day. Will be doing some physical therapy in the near future. Objective:   Physical Exam Vital signs noted. Weight stable. Lungs are clear heart regular extremities very thin in the arms has 1-2+ edema in the lower legs. Her husband states that Lozol is not helping anymore. Neurologic grossly normal. Patient can stand but cannot walk.       Assessment & Plan:  Urology-I. Will discuss with urology if there is another choice other than [facility]  gramig-Will discuss with her orthopedist regarding the back sliding that she is having with her lumbar and sacral pain she does have a history of a sacral fracture. She may need reimaging. Labs-Because of the pedal edema we will go ahead and order lab work. Await the results could be creatinine issues could be do to low protein. May consider stopping Lozol. Possibly consider using Lasix in its place Pedal edema-See above I think this is due to inactivity possibly Neurontin. Reduce Neurontin to 100 mg 2 capsules 3 times daily followup patient in several  weeks Fatigue-See her for fatigue, I think this severe fatigue is related to her debilitated state I told her that she needs to keep a dietary list of how she eats and drinks over the next several days and send that to me her husband will send. Polyuria-Urine specimen ordered. We'll culture it. Nursing consult-We will have our staff connect with the home health folks to do nursing consult as well Weight / urine / labs

## 2012-10-09 LAB — BASIC METABOLIC PANEL
Calcium: 9.1 mg/dL (ref 8.4–10.5)
Creat: 0.74 mg/dL (ref 0.50–1.10)
Sodium: 135 mEq/L (ref 135–145)

## 2012-10-09 LAB — HEPATIC FUNCTION PANEL
ALT: 13 U/L (ref 0–35)
AST: 20 U/L (ref 0–37)
Albumin: 3.3 g/dL — ABNORMAL LOW (ref 3.5–5.2)
Alkaline Phosphatase: 152 U/L — ABNORMAL HIGH (ref 39–117)
Total Bilirubin: 0.4 mg/dL (ref 0.3–1.2)
Total Protein: 6.2 g/dL (ref 6.0–8.3)

## 2012-10-09 LAB — TSH: TSH: 2.456 u[IU]/mL (ref 0.350–4.500)

## 2012-10-10 ENCOUNTER — Telehealth: Payer: Self-pay | Admitting: Family Medicine

## 2012-10-10 ENCOUNTER — Other Ambulatory Visit: Payer: Self-pay | Admitting: Family Medicine

## 2012-10-10 LAB — URINE CULTURE: Colony Count: 30000

## 2012-10-10 MED ORDER — OXYBUTYNIN CHLORIDE 10 % TD GEL: 1.0000 "application " | TRANSDERMAL | Status: DC

## 2012-10-10 NOTE — Telephone Encounter (Signed)
Dr. Lorin Picket,  Callback number for Mr. Burress [phone] * who placed in the call.  Per Mr. Kellogg / Mrs. Horrell is not sleeping well at all and he has some question/concerns about her medication for sleep aid.  Thank you.

## 2012-10-10 NOTE — Telephone Encounter (Signed)
I spoke with the husband. He is having some problems with her sleeping at night. We've recently reduced the Neurontin because of sedation but the reduction has triggered an insomnia. He already has 100 mg capsules and 400 mg capsules at home. He will use 2 of those 100 mg capsules in the morning 2 in the afternoon and she will take a 400 mg capsule near bedtime. Also I did inform him that the urologist recommended gelnique for her bladder situation. I also informed him of the lab results. Encouraged better her nutritional intake and protein intake they will get a food diary to [place] in the near future. She R. He has a followup appointment they will keep this.

## 2012-10-14 ENCOUNTER — Telehealth: Payer: Self-pay | Admitting: Family Medicine

## 2012-10-14 NOTE — Telephone Encounter (Signed)
Georgiann Hahn needs a verbal order to treat this patient for OT for 2x for 5 weeks.

## 2012-10-14 NOTE — Telephone Encounter (Signed)
Who is kat. And a phone number please.

## 2012-10-15 NOTE — Telephone Encounter (Signed)
Verbal order to treat pt for OT 2x for 5 weeks

## 2012-10-15 NOTE — Telephone Encounter (Signed)
Left message on voice mail to return our call.

## 2012-10-15 NOTE — Telephone Encounter (Signed)
Look in the contacts.Marland KitchenMarland KitchenShe is from [facility]

## 2012-10-17 DIAGNOSIS — IMO0002 Reserved for concepts with insufficient information to code with codable children: Secondary | ICD-10-CM

## 2012-10-17 DIAGNOSIS — IMO0001 Reserved for inherently not codable concepts without codable children: Secondary | ICD-10-CM

## 2012-10-20 ENCOUNTER — Other Ambulatory Visit: Payer: Self-pay | Admitting: Family Medicine

## 2012-10-20 ENCOUNTER — Telehealth: Payer: Self-pay | Admitting: Family Medicine

## 2012-10-20 MED ORDER — METAXALONE 800 MG PO TABS: ORAL_TABLET | ORAL | Status: DC

## 2012-10-20 NOTE — Telephone Encounter (Signed)
Family said she is hurting bad in upper body and spasms in back. Nausea bad and feet and legs still swollen. Not sleeping at night due to extreme pain -cant eat much due to pain and nausea.

## 2012-10-20 NOTE — Telephone Encounter (Signed)
Patient is not doing good and the family would like you to give them a call to see if she needs to come back in.

## 2012-10-21 NOTE — Telephone Encounter (Signed)
neurontin authorized to 200 mg in am 400 mg at lunch and evening Edema still present but ambulation sub par, nutrition reviewed, husband to increase nutritional intake, he denies pt having abd pain or swelling.pt saw specialist/ortho they stated this will take time for back to improve. Try skelaxin 800 qhs prn., keep follow up

## 2012-10-27 ENCOUNTER — Encounter: Payer: Self-pay | Admitting: Family Medicine

## 2012-10-27 ENCOUNTER — Ambulatory Visit (INDEPENDENT_AMBULATORY_CARE_PROVIDER_SITE_OTHER): Payer: Medicare Other | Admitting: Family Medicine

## 2012-10-27 VITALS — BP 98/54 | Temp 98.3°F | Wt 103.2 lb

## 2012-10-27 DIAGNOSIS — K5900 Constipation, unspecified: Secondary | ICD-10-CM

## 2012-10-27 DIAGNOSIS — S3210XD Unspecified fracture of sacrum, subsequent encounter for fracture with routine healing: Secondary | ICD-10-CM

## 2012-10-27 DIAGNOSIS — R609 Edema, unspecified: Secondary | ICD-10-CM

## 2012-10-27 DIAGNOSIS — R600 Localized edema: Secondary | ICD-10-CM

## 2012-10-27 MED ORDER — POTASSIUM CHLORIDE ER 10 MEQ PO TBCR: 10.0000 meq | EXTENDED_RELEASE_TABLET | Freq: Two times a day (BID) | ORAL | Status: DC

## 2012-10-27 MED ORDER — TORSEMIDE 20 MG PO TABS: 20.0000 mg | ORAL_TABLET | Freq: Every day | ORAL | Status: DC

## 2012-10-27 NOTE — Progress Notes (Signed)
  Subjective:    Patient ID: Samantha Hudson, female    DOB: [birth year], 78 y.o.   MRN: [id number]  Constipation This is a new problem. The current episode started in the past 7 days. The problem is unchanged. Her stool frequency is 1 time per week or less. The stool is described as pellet like. The patient is on a high fiber diet. There has been adequate water intake. Associated symptoms include back pain and bloating. Risk factors include immobility. She has tried stool softeners and fiber for the symptoms.    Not using gel-niq no change either way Eating more protein Doing physical therapy and OT  Review of Systems  Gastrointestinal: Positive for constipation and bloating.  Musculoskeletal: Positive for back pain.  no fevers Pedal edema-Lozol does not seem to be helping. Patient had CT scan of the abdomen one year ago did not show any tumors or growths.      Objective:   Physical Exam Lungs are clear hearts regular pulse normal blood pressure stable weight good has pedal edema 2+ lower legs.       Assessment & Plan:  Pedal edema-stop Lozol. In its place try Demadex 20 mg every morning when necessary start off 3 times a week if this doesn't do enough gradually increased frequency. When taking Demadex also use potassium 10 mEq twice a day. Continued to work hard on diet and keeping weight up Gradually improve activity with physical therapy hopefully be able to walk independently more often Benefiber along with MiraLax for mild constipation due to inactivity up-to-date on colonoscopy. Followup in one month's time

## 2012-10-29 ENCOUNTER — Ambulatory Visit: Payer: Medicare Other | Admitting: Family Medicine

## 2012-11-06 ENCOUNTER — Other Ambulatory Visit (HOSPITAL_COMMUNITY): Payer: Self-pay | Admitting: Orthopedic Surgery

## 2012-11-06 DIAGNOSIS — M84454D Pathological fracture, pelvis, subsequent encounter for fracture with routine healing: Secondary | ICD-10-CM

## 2012-11-11 ENCOUNTER — Encounter (HOSPITAL_COMMUNITY)
Admission: RE | Admit: 2012-11-11 | Discharge: 2012-11-11 | Disposition: A | Discharge status: Home / Self Care | Payer: Medicare Other | Source: Ambulatory Visit | Attending: Orthopedic Surgery | Admitting: Orthopedic Surgery

## 2012-11-11 DIAGNOSIS — Z87311 Personal history of (healed) other pathological fracture: Secondary | ICD-10-CM | POA: Insufficient documentation

## 2012-11-11 DIAGNOSIS — M84454D Pathological fracture, pelvis, subsequent encounter for fracture with routine healing: Secondary | ICD-10-CM

## 2012-11-11 DIAGNOSIS — R937 Abnormal findings on diagnostic imaging of other parts of musculoskeletal system: Secondary | ICD-10-CM | POA: Insufficient documentation

## 2012-11-11 MED ORDER — TECHNETIUM TC 99M MEDRONATE IV KIT
26.5000 | PACK | Freq: Once | INTRAVENOUS | Status: AC | PRN
  Administered 2012-11-11: 26.5 via INTRAVENOUS

## 2012-11-25 ENCOUNTER — Encounter: Payer: Self-pay | Admitting: Family Medicine

## 2012-11-25 ENCOUNTER — Ambulatory Visit (INDEPENDENT_AMBULATORY_CARE_PROVIDER_SITE_OTHER): Payer: Medicare Other | Admitting: Family Medicine

## 2012-11-25 VITALS — BP 98/66 | Wt 102.0 lb

## 2012-11-25 DIAGNOSIS — R609 Edema, unspecified: Secondary | ICD-10-CM

## 2012-11-25 DIAGNOSIS — R0989 Other specified symptoms and signs involving the circulatory and respiratory systems: Secondary | ICD-10-CM

## 2012-11-25 DIAGNOSIS — M8000XG Age-related osteoporosis with current pathological fracture, unspecified site, subsequent encounter for fracture with delayed healing: Secondary | ICD-10-CM | POA: Insufficient documentation

## 2012-11-25 DIAGNOSIS — R0609 Other forms of dyspnea: Secondary | ICD-10-CM

## 2012-11-25 DIAGNOSIS — S3210XS Unspecified fracture of sacrum, sequela: Secondary | ICD-10-CM

## 2012-11-25 DIAGNOSIS — R600 Localized edema: Secondary | ICD-10-CM

## 2012-11-25 DIAGNOSIS — M810 Age-related osteoporosis without current pathological fracture: Secondary | ICD-10-CM

## 2012-11-25 DIAGNOSIS — R0600 Dyspnea, unspecified: Secondary | ICD-10-CM

## 2012-11-25 LAB — BASIC METABOLIC PANEL
BUN: 24 mg/dL — ABNORMAL HIGH (ref 6–23)
Chloride: 102 mEq/L (ref 96–112)
Glucose, Bld: 144 mg/dL — ABNORMAL HIGH (ref 70–99)
Potassium: 3.9 mEq/L (ref 3.5–5.3)
Sodium: 136 mEq/L (ref 135–145)

## 2012-11-25 NOTE — Progress Notes (Signed)
  Subjective:    Patient ID: Samantha Hudson, female    DOB: 24-Apr-1935, 78 y.o.   MRN: [id number]  HPI Here for a follow up - concerns are bilateral leg and foot swelling. This patient's had long-term swelling in the feet but has progressively gotten worse over the past several weeks. Her nutritional status is not the best she is also fairly immobile. She has physical therapy not patient therapy working with her because a repetitive stress fractures in her lower sacrum. She has a history of osteoporosis and in the past she failed oral high phosphate. IV Boniva made her feel rotten all over and she did not want to take any further. A year ago she refused Reclast.  Family history personal medical history social history reviewed   Review of Systems Negative for chest pain shortness of breath positive for pedal edema negative for orthopnea negative for headaches fever chills or cough    Objective:   Physical Exam  Lungs are clear hearts regular pulse normal blood pressure good at 108/70 abdomen not protruding extremities 1-2+ edema in the lower legs and feet      Assessment & Plan:  Frustrated by her current state of health She does agree to trying new her treatments for osteoporosis we will look into this for her. Stress fractures managed by orthopedics continue physical therapy Pedal edema we will check some lab work she had a CAT scan about a year ago did not show any tumors her exam today is unremarkable except for the pedal edema I believe that this is related to low protein as well as immobility may have to adjust diuretic. I don't feel that this will totally go away until patient's overall status is improved We'll see her back in approximately 2-3 months sooner problems, lab work pending

## 2012-11-25 NOTE — Patient Instructions (Signed)
You  Can fax your diet report through to [place].  Fax [phone]

## 2012-11-26 LAB — BRAIN NATRIURETIC PEPTIDE: Brain Natriuretic Peptide: 261.2 pg/mL — ABNORMAL HIGH (ref 0.0–100.0)

## 2012-11-26 LAB — PREALBUMIN: Prealbumin: 16.7 mg/dL — ABNORMAL LOW (ref 17.0–34.0)

## 2012-11-27 ENCOUNTER — Other Ambulatory Visit: Payer: Self-pay | Admitting: *Deleted

## 2012-11-27 ENCOUNTER — Other Ambulatory Visit: Payer: Self-pay | Admitting: Family Medicine

## 2012-11-27 ENCOUNTER — Ambulatory Visit (HOSPITAL_COMMUNITY)
Admission: RE | Admit: 2012-11-27 | Discharge: 2012-11-27 | Disposition: A | Discharge status: Home / Self Care | Payer: Medicare Other | Source: Ambulatory Visit | Attending: Family Medicine | Admitting: Family Medicine

## 2012-11-27 DIAGNOSIS — R600 Localized edema: Secondary | ICD-10-CM

## 2012-11-27 DIAGNOSIS — R799 Abnormal finding of blood chemistry, unspecified: Secondary | ICD-10-CM | POA: Insufficient documentation

## 2012-11-27 DIAGNOSIS — M7989 Other specified soft tissue disorders: Secondary | ICD-10-CM | POA: Insufficient documentation

## 2012-11-28 ENCOUNTER — Ambulatory Visit (HOSPITAL_COMMUNITY)
Admission: RE | Admit: 2012-11-28 | Discharge: 2012-11-28 | Disposition: A | Discharge status: Home / Self Care | Payer: Medicare Other | Source: Ambulatory Visit | Attending: Family Medicine | Admitting: Family Medicine

## 2012-11-28 DIAGNOSIS — R600 Localized edema: Secondary | ICD-10-CM

## 2012-11-28 DIAGNOSIS — R609 Edema, unspecified: Secondary | ICD-10-CM | POA: Insufficient documentation

## 2012-11-28 DIAGNOSIS — I2510 Atherosclerotic heart disease of native coronary artery without angina pectoris: Secondary | ICD-10-CM | POA: Insufficient documentation

## 2012-11-28 NOTE — Progress Notes (Signed)
*  PRELIMINARY RESULTS* Echocardiogram 2D Echocardiogram has been performed.  Samantha Hudson 11/28/2012, 9:14 AM

## 2012-12-09 ENCOUNTER — Encounter: Payer: Self-pay | Admitting: Family Medicine

## 2012-12-09 ENCOUNTER — Ambulatory Visit (INDEPENDENT_AMBULATORY_CARE_PROVIDER_SITE_OTHER): Payer: Medicare Other | Admitting: Family Medicine

## 2012-12-09 VITALS — BP 100/68 | Wt 102.6 lb

## 2012-12-09 DIAGNOSIS — Z79899 Other long term (current) drug therapy: Secondary | ICD-10-CM

## 2012-12-09 MED ORDER — TORSEMIDE 20 MG PO TABS: ORAL_TABLET | ORAL | Status: DC

## 2012-12-09 NOTE — Progress Notes (Signed)
  Subjective:    Patient ID: Samantha Hudson, female    DOB: August 25, 1934, 78 y.o.   MRN: [id number]  HPI Patient denies any chest tightness pressure pain or shortness of breath. She does have leg swelling. Diuretic seems to be helping some. Past medical history pedal edema family history noncontributory patient does not smoke   Review of Systems  Constitutional: Negative for fever and fatigue.  HENT: Negative for neck pain.   Respiratory: Negative for cough, shortness of breath and wheezing.   Cardiovascular: Positive for leg swelling. Negative for chest pain.  Gastrointestinal: Negative for abdominal distention.  Endocrine: Negative for heat intolerance and polydipsia.       Objective:   Physical Exam  HENT:  Head: Normocephalic.  Neck: No thyromegaly present.  Cardiovascular: Normal rate, regular rhythm and normal heart sounds.   Pulmonary/Chest: Effort normal and breath sounds normal. No respiratory distress. She has no rales.  Abdominal: Soft. Bowel sounds are normal. She exhibits no distension.  Musculoskeletal: She exhibits edema.  Lymphadenopathy:    She has no cervical adenopathy.  Neurological: She is alert.  Skin: Skin is warm and dry.          Assessment & Plan:  Pedal edema-overall doing somewhat better. Increase diuretic 1-1/2 each morning. See the patient back several weeks. If the edema is not doing significantly better consider CAT scan of the abdomen and pelvis to rule out lesion.

## 2012-12-15 ENCOUNTER — Ambulatory Visit (INDEPENDENT_AMBULATORY_CARE_PROVIDER_SITE_OTHER): Payer: Medicare Other | Admitting: Family Medicine

## 2012-12-15 ENCOUNTER — Encounter: Payer: Self-pay | Admitting: Family Medicine

## 2012-12-15 VITALS — BP 100/68 | Temp 98.2°F | Wt 102.8 lb

## 2012-12-15 DIAGNOSIS — R609 Edema, unspecified: Secondary | ICD-10-CM

## 2012-12-15 DIAGNOSIS — R109 Unspecified abdominal pain: Secondary | ICD-10-CM

## 2012-12-15 DIAGNOSIS — R600 Localized edema: Secondary | ICD-10-CM

## 2012-12-15 MED ORDER — TORSEMIDE 20 MG PO TABS: ORAL_TABLET | ORAL | Status: DC

## 2012-12-15 NOTE — Progress Notes (Signed)
  Subjective:    Patient ID: Samantha Hudson, female    DOB: Feb 24, 1935, 78 y.o.   MRN: [id number]  HPIBilateral foot and leg swelling.   Pt states she is having a reaction to one of her meds. Making her think bad thoughts. Patient with severe leg swelling progressive despite using diuretics. Causing more issues. Family is concerned. Patient also relates abdominal bloating pressure or discomfort. This is been progressive over the past several weeks. She has had poor appetite intermittently in several different times has lost some weight. No fevers no rectal bleeding she does have moderate constipation issues having to use MiraLax Feel like a tight band around her stomach. Going on for awhile PMH benign Review of Systems See above    Objective:   Physical Exam Lungs are clear no crackles heart regular no gallop pulse normal abdomen soft slightly distended intermittent tenderness throughout Extremities severe swelling compared to a week ago 2+ edema all the way up to her knees       Assessment & Plan:  Pedal edema-increased diuretic. 2 each morning possibly 2 in the morning 1 at noon(it should be noted that this patient has been on Neurontin for 7 years and has never had swelling problems I doubt it's related to this medicine) Intermittent abdominal pain along with swelling-given significant pedal edema and abdominal discomfort I recommend a scan to rule out intra-abdominal lesion as possibility of causing her swelling

## 2012-12-18 ENCOUNTER — Ambulatory Visit (HOSPITAL_COMMUNITY)
Admission: RE | Admit: 2012-12-18 | Discharge: 2012-12-18 | Disposition: A | Discharge status: Home / Self Care | Payer: Medicare Other | Source: Ambulatory Visit | Attending: Family Medicine | Admitting: Family Medicine

## 2012-12-18 DIAGNOSIS — R634 Abnormal weight loss: Secondary | ICD-10-CM | POA: Insufficient documentation

## 2012-12-18 DIAGNOSIS — R109 Unspecified abdominal pain: Secondary | ICD-10-CM | POA: Insufficient documentation

## 2012-12-18 MED ORDER — IOHEXOL 300 MG/ML  SOLN
100.0000 mL | Freq: Once | INTRAMUSCULAR | Status: AC | PRN
  Administered 2012-12-18: 100 mL via INTRAVENOUS

## 2012-12-22 ENCOUNTER — Telehealth: Payer: Self-pay | Admitting: Family Medicine

## 2012-12-22 NOTE — Telephone Encounter (Signed)
Samantha Hudson states he needs to talk with Dr. Lorin Picket or Nurse regarding test [name] had completed last week.  States she was up with walker now she is back in the wheelchair.  Please call  Thanks

## 2012-12-22 NOTE — Telephone Encounter (Signed)
I spoke with Mr. Endo regarding the CAT scan results. It did not show any tumors. It does show sacrum fractures but is difficult to know if this is new or old. In addition to this she is having increased discomfort in her back and is now utilizing a wheelchair to get around she still has swelling in the legs. The diuretic seems to be helping a little bit. It is still possible this swelling could be related to the Neurontin but she has been on this for a long span of time without problems. He will go ahead and set her up with orthopedics for further evaluation. This is concerning for the possibility of progressive fractures.

## 2012-12-23 ENCOUNTER — Ambulatory Visit: Payer: Medicare Other | Admitting: Family Medicine

## 2012-12-24 LAB — BASIC METABOLIC PANEL
CO2: 32 mEq/L (ref 19–32)
Chloride: 99 mEq/L (ref 96–112)
Creat: 1.01 mg/dL (ref 0.50–1.10)
Glucose, Bld: 118 mg/dL — ABNORMAL HIGH (ref 70–99)
Sodium: 138 mEq/L (ref 135–145)

## 2012-12-25 ENCOUNTER — Encounter (HOSPITAL_COMMUNITY): Payer: Self-pay | Admitting: *Deleted

## 2012-12-25 ENCOUNTER — Telehealth: Payer: Self-pay | Admitting: Family Medicine

## 2012-12-25 NOTE — Telephone Encounter (Signed)
The patient recently was having hip pain and she followed up with all orthopedics. Their office did x-rays and testing showing a fractured hip she has surgery coming up it is possible she may end up coming to the [facility] center for rehabilitation. Her husband will keep [place] posted on her situation.

## 2012-12-25 NOTE — Progress Notes (Signed)
SPOKE WITH PATIENT AND HER HUSBAND REGARDING PT'S MEDICAL HISTORY AND PREOP INSTRUCTIONS FOR DAY OF SURGERY - PT UNABLE TO COME FOR PREOP APPOINTMENT - HAS FRACTURED HIP - NO WEIGHT BEARING ON HIP.  CHLORHEXIDINE INSTRUCTIONS / PRECAUTIONS REVIEWED.

## 2012-12-25 NOTE — Telephone Encounter (Signed)
Ms. Baxley wants Dr. Lorin Picket to call him regarding Moxie.  She is having surgery on Monday.  Please call Patient. Thanks

## 2012-12-28 NOTE — H&P (Signed)
Samantha Hudson is an 78 y.o. female.    Chief Complaint: Left femoral neck fracture  Procedure:   Left hip hemiarthroplasty   HPI: Pt is a 78 y.o. female complaining of increasing significant left hip pain for 2 weeks.  This year she has been struggling to heal from a wrist fracture since February, treated by Dr. Amanda Hudson. In April she started to complain of pain in the sacral area and was diagnosed with a sacral stress fracture.  She has started to feel better until about 2 weeks ago when she had significant left hip pain, causing her to not be able to walk on the left leg.  Imaging studies eventually showed a left femoral neck fracture. Various options are discussed with the patient. Risks, benefits and expectations were discussed with the patient. Patient understand the risks, benefits and expectations and wishes to proceed with surgery.   PCP:  Samantha Punt, MD  D/C Plans:  Home with HHPT/SNF  Post-op Meds:   No Rx given  Tranexamic Acid:   Not to be given - previous stroke and CAD  Decadron:   To be given  FYI:  ASA post-op  PMH: Past Medical History  Diagnosis Date  . Hyperlipemia   . IBS (irritable bowel syndrome)   . Osteoporosis   . Fibromyalgia   . Glaucoma     HAD LASER SURGERY - NO EYE DROPS REQUIRED  . Globus hystericus   . Fibromyalgia   . Meningioma   . CAD (coronary artery disease)     HEART STENT PLACED ABOUT 2000- NO LONGER SEES CARDIOLOGIST; NO C/O OF CHEST PAINS OR SOB  . Heart murmur     MVP SINCE THE 60'S - TAKES ATENOLOL -  . Anxiety   . Swelling     BILATERAL FEET AND LEGS - ONSET OF SWELLING APRIL 2014  . GERD (gastroesophageal reflux disease)   . Arthritis   . Fracture   . CVA (cerebral vascular accident)     STROKE 7 YRS AGO - DAY AFTER BRAIN SURGERY TO REMOVE BENIGN MENINGIOMA - HAS LEFT SIDED WEAKNESS AND A LITTLE NUMBNESS  . Pain     "EXCRUCIATING PAIN" LEFT HIP - HAS A FRACTURE - IS CONFINED TO W/C AT HOME - NO WEIGHT BEARING LEFT HIP.     PSH: Past Surgical History  Procedure Laterality Date  . Brain surgery      tumor removed  . Tonsillectomy    . Partial hysterectomy    . Glaucoma repair    . Cataract extraction    . Kidney surgery      left - HX ENLARGED LEFT KIDNEY ON XRAY - SURGERY WAS DONE ON URETER   . Colonoscopy    . Coronary angioplasty      Social History:  reports that she has never smoked. She has never used smokeless tobacco. She reports that she does not drink alcohol or use illicit drugs.  Allergies:  Allergies  Allergen Reactions  . Lactose Intolerance (Gi)   . Penicillins     RASH - REACTION WAS YEARS AGO    Medications: No current facility-administered medications for this encounter.   Current Outpatient Prescriptions  Medication Sig Dispense Refill  . gabapentin (NEURONTIN) 400 MG capsule Take 400 mg by mouth 3 (three) times daily.      . ranitidine (ZANTAC) 300 MG tablet Take 300 mg by mouth once. TAKES IN AM      . ALPRAZolam (XANAX) 0.5 MG tablet Take 0.5  mg by mouth daily as needed for anxiety.       Marland Kitchen atenolol (TENORMIN) 25 MG tablet Take 1 tablet (25 mg total) by mouth 2 (two) times daily.  60 tablet  12  . atorvastatin (LIPITOR) 10 MG tablet Take 10 mg by mouth at bedtime.      . dipyridamole-aspirin (AGGRENOX) 200-25 MG per 12 hr capsule Take 1 capsule by mouth 2 (two) times daily.  60 capsule  12  . DULoxetine (CYMBALTA) 30 MG capsule Take 30 mg by mouth daily. TAKES IN AM      . gabapentin (NEURONTIN) 100 MG capsule Take 100 mg by mouth. Take 2 qam, 4 in the afternoon, and 4 qhs      . Lactase (LACTAID FAST ACT PO) Take 1 tablet by mouth daily as needed (stomach relief).       . polyethylene glycol powder (GLYCOLAX/MIRALAX) powder Take 17 g by mouth daily.      . potassium chloride (K-DUR) 10 MEQ tablet Take 1 tablet (10 mEq total) by mouth 2 (two) times daily.  60 tablet  4  . ranitidine (ZANTAC) 150 MG tablet Take 150 mg by mouth daily as needed for heartburn.       .  torsemide (DEMADEX) 20 MG tablet 2qam then 1 at noon prn for edema  90 tablet  6  . traMADol (ULTRAM) 50 MG tablet Take 50 mg by mouth 4 (four) times daily.          Review of Systems  Constitutional: Negative.   HENT: Negative.   Eyes: Negative.   Respiratory: Negative.   Cardiovascular: Negative.   Gastrointestinal: Positive for heartburn, diarrhea and constipation.  Genitourinary: Negative.   Musculoskeletal: Positive for myalgias and joint pain.  Skin: Negative.   Neurological: Negative.   Endo/Heme/Allergies: Negative.   Psychiatric/Behavioral: The patient is nervous/anxious.      Physical Exam  Constitutional: She is oriented to person, place, and time. She appears unhealthy.  HENT:  Head: Normocephalic and atraumatic.  Mouth/Throat: Oropharynx is clear and moist.  Eyes: Conjunctivae are normal. Pupils are equal, round, and reactive to light.  Neck: Normal range of motion. No JVD present. No tracheal deviation present. No thyromegaly present.  Cardiovascular: Normal rate, regular rhythm and intact distal pulses.   Murmur heard. Pulmonary/Chest: Effort normal and breath sounds normal. No stridor. No respiratory distress. She has no wheezes.  Abdominal: Soft. There is no tenderness. There is no guarding.  Musculoskeletal:       Left hip: She exhibits decreased range of motion, decreased strength, tenderness and bony tenderness. She exhibits no swelling, no deformity and no laceration.  Lymphadenopathy:    She has no cervical adenopathy.  Neurological: She is alert and oriented to person, place, and time.  Skin: Skin is warm and dry.  Psychiatric: Affect normal.      Assessment/Plan Assessment:   Left femoral neck fracture  Plan: Patient will undergo a left hip hemiarthroplasty on 12/29/2012 per Dr. Charlann Boxer at [facility]. Risks benefits and expectations were discussed with the patient. Patient understand risks, benefits and expectations and wishes to  proceed.   Anastasio Auerbach Willadeen Colantuono   PAC  12/28/2012, 2:26 PM

## 2012-12-29 ENCOUNTER — Encounter (HOSPITAL_COMMUNITY): Payer: Self-pay | Admitting: Certified Registered"

## 2012-12-29 ENCOUNTER — Inpatient Hospital Stay (HOSPITAL_COMMUNITY): Payer: Medicare Other

## 2012-12-29 ENCOUNTER — Inpatient Hospital Stay (HOSPITAL_COMMUNITY): Payer: Medicare Other | Admitting: Certified Registered"

## 2012-12-29 ENCOUNTER — Encounter (HOSPITAL_COMMUNITY): Payer: Self-pay | Admitting: *Deleted

## 2012-12-29 ENCOUNTER — Inpatient Hospital Stay (HOSPITAL_COMMUNITY)
Admission: RE | Admit: 2012-12-29 | Discharge: 2013-01-01 | DRG: 470 | Disposition: A | Discharge status: Skilled Nursing Facility | Payer: Medicare Other | Source: Ambulatory Visit | Attending: Orthopedic Surgery | Admitting: Orthopedic Surgery

## 2012-12-29 ENCOUNTER — Encounter (HOSPITAL_COMMUNITY)
Admission: RE | Disposition: A | Discharge status: Skilled Nursing Facility | Payer: Self-pay | Source: Ambulatory Visit | Attending: Orthopedic Surgery

## 2012-12-29 DIAGNOSIS — R5381 Other malaise: Secondary | ICD-10-CM | POA: Diagnosis present

## 2012-12-29 DIAGNOSIS — S72009A Fracture of unspecified part of neck of unspecified femur, initial encounter for closed fracture: Principal | ICD-10-CM | POA: Diagnosis present

## 2012-12-29 DIAGNOSIS — Z87311 Personal history of (healed) other pathological fracture: Secondary | ICD-10-CM

## 2012-12-29 DIAGNOSIS — Z96649 Presence of unspecified artificial hip joint: Secondary | ICD-10-CM

## 2012-12-29 DIAGNOSIS — Z681 Body mass index (BMI) 19 or less, adult: Secondary | ICD-10-CM

## 2012-12-29 DIAGNOSIS — D62 Acute posthemorrhagic anemia: Secondary | ICD-10-CM | POA: Diagnosis not present

## 2012-12-29 DIAGNOSIS — R636 Underweight: Secondary | ICD-10-CM | POA: Diagnosis present

## 2012-12-29 DIAGNOSIS — I2510 Atherosclerotic heart disease of native coronary artery without angina pectoris: Secondary | ICD-10-CM | POA: Diagnosis present

## 2012-12-29 DIAGNOSIS — F411 Generalized anxiety disorder: Secondary | ICD-10-CM | POA: Diagnosis present

## 2012-12-29 DIAGNOSIS — E785 Hyperlipidemia, unspecified: Secondary | ICD-10-CM | POA: Diagnosis present

## 2012-12-29 DIAGNOSIS — I69998 Other sequelae following unspecified cerebrovascular disease: Secondary | ICD-10-CM

## 2012-12-29 DIAGNOSIS — M810 Age-related osteoporosis without current pathological fracture: Secondary | ICD-10-CM | POA: Diagnosis present

## 2012-12-29 DIAGNOSIS — IMO0001 Reserved for inherently not codable concepts without codable children: Secondary | ICD-10-CM | POA: Diagnosis present

## 2012-12-29 DIAGNOSIS — X58XXXA Exposure to other specified factors, initial encounter: Secondary | ICD-10-CM | POA: Diagnosis present

## 2012-12-29 HISTORY — DX: Anxiety disorder, unspecified: F41.9

## 2012-12-29 HISTORY — DX: Pain, unspecified: R52

## 2012-12-29 HISTORY — PX: HIP ARTHROPLASTY: SHX981

## 2012-12-29 HISTORY — DX: Other injury of unspecified body region, initial encounter: T14.8XXA

## 2012-12-29 HISTORY — DX: Gastro-esophageal reflux disease without esophagitis: K21.9

## 2012-12-29 HISTORY — DX: Cardiac murmur, unspecified: R01.1

## 2012-12-29 HISTORY — DX: Unspecified osteoarthritis, unspecified site: M19.90

## 2012-12-29 HISTORY — DX: Edema, unspecified: R60.9

## 2012-12-29 LAB — TYPE AND SCREEN
ABO/RH(D): O POS
Antibody Screen: NEGATIVE

## 2012-12-29 LAB — CBC
HCT: 33.8 % — ABNORMAL LOW (ref 36.0–46.0)
Hemoglobin: 10.8 g/dL — ABNORMAL LOW (ref 12.0–15.0)
MCH: 29.3 pg (ref 26.0–34.0)
MCV: 91.8 fL (ref 78.0–100.0)
RBC: 3.68 MIL/uL — ABNORMAL LOW (ref 3.87–5.11)
WBC: 4.6 10*3/uL (ref 4.0–10.5)

## 2012-12-29 LAB — SURGICAL PCR SCREEN
MRSA, PCR: NEGATIVE
Staphylococcus aureus: NEGATIVE

## 2012-12-29 LAB — BASIC METABOLIC PANEL
BUN: 28 mg/dL — ABNORMAL HIGH (ref 6–23)
CO2: 37 mEq/L — ABNORMAL HIGH (ref 19–32)
Chloride: 97 mEq/L (ref 96–112)
Creatinine, Ser: 0.88 mg/dL (ref 0.50–1.10)
Glucose, Bld: 80 mg/dL (ref 70–99)

## 2012-12-29 LAB — PROTIME-INR: Prothrombin Time: 13 seconds (ref 11.6–15.2)

## 2012-12-29 SURGERY — HEMIARTHROPLASTY, HIP, DIRECT ANTERIOR APPROACH, FOR FRACTURE
Anesthesia: General | Site: Hip | Laterality: Left | Wound class: Clean

## 2012-12-29 MED ORDER — ATENOLOL 25 MG PO TABS
25.0000 mg | ORAL_TABLET | Freq: Two times a day (BID) | ORAL | Status: DC
  Administered 2012-12-29 – 2013-01-01 (×6): 25 mg via ORAL
  Filled 2012-12-29 (×8): qty 1

## 2012-12-29 MED ORDER — TRAMADOL HCL 50 MG PO TABS
50.0000 mg | ORAL_TABLET | Freq: Four times a day (QID) | ORAL | Status: DC
  Administered 2012-12-29 – 2013-01-01 (×10): 50 mg via ORAL
  Filled 2012-12-29 (×10): qty 1

## 2012-12-29 MED ORDER — MIDAZOLAM HCL 5 MG/5ML IJ SOLN
INTRAMUSCULAR | Status: DC | PRN
  Administered 2012-12-29 (×2): 1 mg via INTRAVENOUS

## 2012-12-29 MED ORDER — DULOXETINE HCL 30 MG PO CPEP
30.0000 mg | ORAL_CAPSULE | Freq: Every morning | ORAL | Status: DC
  Administered 2012-12-30 – 2013-01-01 (×3): 30 mg via ORAL
  Filled 2012-12-29 (×3): qty 1

## 2012-12-29 MED ORDER — DEXAMETHASONE SODIUM PHOSPHATE 10 MG/ML IJ SOLN: 10.0000 mg | Freq: Once | INTRAMUSCULAR | Status: DC

## 2012-12-29 MED ORDER — PHENOL 1.4 % MT LIQD: 1.0000 | OROMUCOSAL | Status: DC | PRN

## 2012-12-29 MED ORDER — POLYETHYLENE GLYCOL 3350 17 G PO PACK: 17.0000 g | PACK | Freq: Every day | ORAL | Status: DC | PRN

## 2012-12-29 MED ORDER — POTASSIUM CHLORIDE ER 10 MEQ PO TBCR
10.0000 meq | EXTENDED_RELEASE_TABLET | Freq: Two times a day (BID) | ORAL | Status: DC
  Administered 2012-12-29 – 2012-12-31 (×4): 10 meq via ORAL
  Filled 2012-12-29 (×6): qty 1

## 2012-12-29 MED ORDER — FAMOTIDINE 40 MG PO TABS
40.0000 mg | ORAL_TABLET | Freq: Every day | ORAL | Status: DC
  Administered 2012-12-29 – 2012-12-31 (×3): 40 mg via ORAL
  Filled 2012-12-29 (×5): qty 1

## 2012-12-29 MED ORDER — METOCLOPRAMIDE HCL 10 MG PO TABS: 5.0000 mg | ORAL_TABLET | Freq: Three times a day (TID) | ORAL | Status: DC | PRN

## 2012-12-29 MED ORDER — ONDANSETRON HCL 4 MG PO TABS: 4.0000 mg | ORAL_TABLET | Freq: Four times a day (QID) | ORAL | Status: DC | PRN

## 2012-12-29 MED ORDER — MORPHINE SULFATE 2 MG/ML IJ SOLN
0.5000 mg | INTRAMUSCULAR | Status: DC | PRN
  Administered 2012-12-29: 0.5 mg via INTRAVENOUS
  Filled 2012-12-29: qty 1

## 2012-12-29 MED ORDER — SUCCINYLCHOLINE CHLORIDE 20 MG/ML IJ SOLN
INTRAMUSCULAR | Status: DC | PRN
  Administered 2012-12-29: 100 mg via INTRAVENOUS

## 2012-12-29 MED ORDER — LACTATED RINGERS IV SOLN
INTRAVENOUS | Status: DC
  Administered 2012-12-29: 16:00:00 via INTRAVENOUS
  Administered 2012-12-29: 1000 mL via INTRAVENOUS

## 2012-12-29 MED ORDER — CLINDAMYCIN PHOSPHATE 900 MG/50ML IV SOLN
900.0000 mg | INTRAVENOUS | Status: AC
  Administered 2012-12-29: 900 mg via INTRAVENOUS

## 2012-12-29 MED ORDER — ACETAMINOPHEN 325 MG PO TABS: 650.0000 mg | ORAL_TABLET | Freq: Four times a day (QID) | ORAL | Status: DC | PRN

## 2012-12-29 MED ORDER — METOCLOPRAMIDE HCL 5 MG/ML IJ SOLN: 5.0000 mg | Freq: Three times a day (TID) | INTRAMUSCULAR | Status: DC | PRN

## 2012-12-29 MED ORDER — DOCUSATE SODIUM 100 MG PO CAPS
100.0000 mg | ORAL_CAPSULE | Freq: Two times a day (BID) | ORAL | Status: DC
  Administered 2012-12-29 – 2012-12-31 (×5): 100 mg via ORAL

## 2012-12-29 MED ORDER — CEFAZOLIN SODIUM 1-5 GM-% IV SOLN
1.0000 g | Freq: Four times a day (QID) | INTRAVENOUS | Status: AC
  Administered 2012-12-29 – 2012-12-30 (×2): 1 g via INTRAVENOUS
  Filled 2012-12-29 (×2): qty 50

## 2012-12-29 MED ORDER — ONDANSETRON HCL 4 MG/2ML IJ SOLN: 4.0000 mg | Freq: Four times a day (QID) | INTRAMUSCULAR | Status: DC | PRN

## 2012-12-29 MED ORDER — POLYETHYLENE GLYCOL 3350 17 G PO PACK
17.0000 g | PACK | Freq: Every day | ORAL | Status: DC
  Administered 2012-12-29 – 2012-12-31 (×3): 17 g via ORAL
  Filled 2012-12-29 (×3): qty 1

## 2012-12-29 MED ORDER — MUPIROCIN 2 % EX OINT
TOPICAL_OINTMENT | Freq: Two times a day (BID) | CUTANEOUS | Status: DC
  Administered 2012-12-29: 1 via NASAL
  Filled 2012-12-29: qty 22

## 2012-12-29 MED ORDER — ACETAMINOPHEN 650 MG RE SUPP: 650.0000 mg | Freq: Four times a day (QID) | RECTAL | Status: DC | PRN

## 2012-12-29 MED ORDER — FERROUS SULFATE 325 (65 FE) MG PO TABS
325.0000 mg | ORAL_TABLET | Freq: Three times a day (TID) | ORAL | Status: DC
  Administered 2012-12-29 – 2013-01-01 (×8): 325 mg via ORAL
  Filled 2012-12-29 (×11): qty 1

## 2012-12-29 MED ORDER — GABAPENTIN 400 MG PO CAPS
400.0000 mg | ORAL_CAPSULE | Freq: Three times a day (TID) | ORAL | Status: DC
  Administered 2012-12-29 – 2013-01-01 (×8): 400 mg via ORAL
  Filled 2012-12-29 (×11): qty 1

## 2012-12-29 MED ORDER — FENTANYL CITRATE 0.05 MG/ML IJ SOLN: 25.0000 ug | INTRAMUSCULAR | Status: DC | PRN

## 2012-12-29 MED ORDER — PROPOFOL 10 MG/ML IV BOLUS
INTRAVENOUS | Status: DC | PRN
  Administered 2012-12-29: 160 mg via INTRAVENOUS

## 2012-12-29 MED ORDER — 0.9 % SODIUM CHLORIDE (POUR BTL) OPTIME
TOPICAL | Status: DC | PRN
  Administered 2012-12-29: 1000 mL

## 2012-12-29 MED ORDER — HYDROCODONE-ACETAMINOPHEN 5-325 MG PO TABS
1.0000 | ORAL_TABLET | Freq: Four times a day (QID) | ORAL | Status: DC | PRN
  Administered 2012-12-30 (×3): 1 via ORAL
  Administered 2012-12-31 – 2013-01-01 (×3): 2 via ORAL
  Filled 2012-12-29: qty 1
  Filled 2012-12-29 (×2): qty 2
  Filled 2012-12-29: qty 1
  Filled 2012-12-29: qty 2
  Filled 2012-12-29 (×2): qty 1

## 2012-12-29 MED ORDER — BUPIVACAINE LIPOSOME 1.3 % IJ SUSP
20.0000 mL | Freq: Once | INTRAMUSCULAR | Status: DC
  Filled 2012-12-29: qty 20

## 2012-12-29 MED ORDER — ASPIRIN-DIPYRIDAMOLE ER 25-200 MG PO CP12
1.0000 | ORAL_CAPSULE | Freq: Two times a day (BID) | ORAL | Status: DC
  Administered 2012-12-29 – 2013-01-01 (×5): 1 via ORAL
  Filled 2012-12-29 (×7): qty 1

## 2012-12-29 MED ORDER — ATORVASTATIN CALCIUM 10 MG PO TABS
10.0000 mg | ORAL_TABLET | Freq: Every day | ORAL | Status: DC
  Administered 2012-12-29 – 2012-12-31 (×3): 10 mg via ORAL
  Filled 2012-12-29 (×5): qty 1

## 2012-12-29 MED ORDER — LIDOCAINE HCL (CARDIAC) 20 MG/ML IV SOLN
INTRAVENOUS | Status: DC | PRN
  Administered 2012-12-29: 50 mg via INTRAVENOUS

## 2012-12-29 MED ORDER — METHOCARBAMOL 100 MG/ML IJ SOLN
500.0000 mg | Freq: Four times a day (QID) | INTRAVENOUS | Status: DC | PRN
  Filled 2012-12-29: qty 5

## 2012-12-29 MED ORDER — METHOCARBAMOL 500 MG PO TABS
500.0000 mg | ORAL_TABLET | Freq: Four times a day (QID) | ORAL | Status: DC | PRN
  Administered 2012-12-29 – 2012-12-31 (×3): 500 mg via ORAL
  Filled 2012-12-29 (×3): qty 1

## 2012-12-29 MED ORDER — TORSEMIDE 20 MG PO TABS: 20.0000 mg | ORAL_TABLET | ORAL | Status: DC

## 2012-12-29 MED ORDER — FENTANYL CITRATE 0.05 MG/ML IJ SOLN
INTRAMUSCULAR | Status: DC | PRN
  Administered 2012-12-29 (×2): 50 ug via INTRAVENOUS

## 2012-12-29 MED ORDER — TORSEMIDE 20 MG PO TABS
40.0000 mg | ORAL_TABLET | Freq: Every day | ORAL | Status: DC
  Administered 2012-12-30 – 2013-01-01 (×3): 40 mg via ORAL
  Filled 2012-12-29 (×4): qty 2

## 2012-12-29 MED ORDER — MENTHOL 3 MG MT LOZG
1.0000 | LOZENGE | OROMUCOSAL | Status: DC | PRN
  Filled 2012-12-29: qty 9

## 2012-12-29 MED ORDER — ONDANSETRON HCL 4 MG/2ML IJ SOLN
INTRAMUSCULAR | Status: DC | PRN
  Administered 2012-12-29: 4 mg via INTRAVENOUS

## 2012-12-29 MED ORDER — CHLORHEXIDINE GLUCONATE 4 % EX LIQD: 60.0000 mL | Freq: Once | CUTANEOUS | Status: DC

## 2012-12-29 MED ORDER — ASPIRIN EC 325 MG PO TBEC
325.0000 mg | DELAYED_RELEASE_TABLET | Freq: Every day | ORAL | Status: DC
  Administered 2012-12-30 – 2012-12-31 (×2): 325 mg via ORAL
  Filled 2012-12-29 (×3): qty 1

## 2012-12-29 MED ORDER — SODIUM CHLORIDE 0.9 % IV SOLN
INTRAVENOUS | Status: DC
  Administered 2012-12-29 – 2012-12-31 (×2): via INTRAVENOUS
  Filled 2012-12-29 (×5): qty 1000

## 2012-12-29 MED ORDER — TORSEMIDE 20 MG PO TABS
20.0000 mg | ORAL_TABLET | Freq: Every day | ORAL | Status: DC
  Administered 2012-12-30 – 2012-12-31 (×2): 20 mg via ORAL
  Filled 2012-12-29 (×3): qty 1

## 2012-12-29 SURGICAL SUPPLY — 49 items
ADH SKN CLS APL DERMABOND .7 (GAUZE/BANDAGES/DRESSINGS) ×1
BAG SPEC THK2 15X12 ZIP CLS (MISCELLANEOUS) ×1
BAG ZIPLOCK 12X15 (MISCELLANEOUS) ×2 IMPLANT
BLADE SAW SGTL 18X1.27X75 (BLADE) ×2 IMPLANT
CAPT HIP HD POR BIPOL/UNIPOL ×1 IMPLANT
CLOTH BEACON ORANGE TIMEOUT ST (SAFETY) ×2 IMPLANT
DERMABOND ADVANCED (GAUZE/BANDAGES/DRESSINGS) ×1
DERMABOND ADVANCED .7 DNX12 (GAUZE/BANDAGES/DRESSINGS) ×1 IMPLANT
DRAPE INCISE IOBAN 85X60 (DRAPES) ×2 IMPLANT
DRAPE ORTHO SPLIT 77X108 STRL (DRAPES) ×4
DRAPE POUCH INSTRU U-SHP 10X18 (DRAPES) ×2 IMPLANT
DRAPE SURG 17X11 SM STRL (DRAPES) ×2 IMPLANT
DRAPE SURG ORHT 6 SPLT 77X108 (DRAPES) ×2 IMPLANT
DRAPE U-SHAPE 47X51 STRL (DRAPES) ×2 IMPLANT
DRSG AQUACEL AG ADV 3.5X10 (GAUZE/BANDAGES/DRESSINGS) ×2 IMPLANT
DRSG TEGADERM 4X4.75 (GAUZE/BANDAGES/DRESSINGS) ×2 IMPLANT
DURAPREP 26ML APPLICATOR (WOUND CARE) ×2 IMPLANT
ELECT BLADE TIP CTD 4 INCH (ELECTRODE) ×2 IMPLANT
ELECT REM PT RETURN 9FT ADLT (ELECTROSURGICAL) ×2
ELECTRODE REM PT RTRN 9FT ADLT (ELECTROSURGICAL) ×1 IMPLANT
EVACUATOR 1/8 PVC DRAIN (DRAIN) ×1 IMPLANT
FACESHIELD LNG OPTICON STERILE (SAFETY) ×8 IMPLANT
GAUZE SPONGE 2X2 8PLY STRL LF (GAUZE/BANDAGES/DRESSINGS) ×1 IMPLANT
GLOVE BIOGEL PI IND STRL 7.5 (GLOVE) ×1 IMPLANT
GLOVE BIOGEL PI IND STRL 8 (GLOVE) ×1 IMPLANT
GLOVE BIOGEL PI INDICATOR 7.5 (GLOVE) ×1
GLOVE BIOGEL PI INDICATOR 8 (GLOVE) ×1
GLOVE ECLIPSE 8.0 STRL XLNG CF (GLOVE) IMPLANT
GLOVE ORTHO TXT STRL SZ7.5 (GLOVE) ×4 IMPLANT
GLOVE SURG ORTHO 8.0 STRL STRW (GLOVE) ×2 IMPLANT
GOWN BRE IMP PREV XXLGXLNG (GOWN DISPOSABLE) ×4 IMPLANT
GOWN STRL NON-REIN LRG LVL3 (GOWN DISPOSABLE) ×2 IMPLANT
HANDPIECE INTERPULSE COAX TIP (DISPOSABLE)
IMMOBILIZER KNEE 20 (SOFTGOODS)
IMMOBILIZER KNEE 20 THIGH 36 (SOFTGOODS) IMPLANT
KIT BASIN OR (CUSTOM PROCEDURE TRAY) ×2 IMPLANT
MANIFOLD NEPTUNE II (INSTRUMENTS) ×2 IMPLANT
PACK TOTAL JOINT (CUSTOM PROCEDURE TRAY) ×2 IMPLANT
POSITIONER SURGICAL ARM (MISCELLANEOUS) ×2 IMPLANT
SET HNDPC FAN SPRY TIP SCT (DISPOSABLE) IMPLANT
SPONGE GAUZE 2X2 STER 10/PKG (GAUZE/BANDAGES/DRESSINGS)
STRIP CLOSURE SKIN 1/2X4 (GAUZE/BANDAGES/DRESSINGS) ×2 IMPLANT
SUT ETHIBOND NAB CT1 #1 30IN (SUTURE) ×2 IMPLANT
SUT MNCRL AB 4-0 PS2 18 (SUTURE) ×2 IMPLANT
SUT VIC AB 1 CT1 36 (SUTURE) ×4 IMPLANT
SUT VIC AB 2-0 CT1 27 (SUTURE) ×4
SUT VIC AB 2-0 CT1 TAPERPNT 27 (SUTURE) ×2 IMPLANT
TOWEL OR 17X26 10 PK STRL BLUE (TOWEL DISPOSABLE) ×4 IMPLANT
TRAY FOLEY CATH 14FRSI W/METER (CATHETERS) ×2 IMPLANT

## 2012-12-29 NOTE — Brief Op Note (Signed)
12/29/2012  4:41 PM  PATIENT:  Samantha Hudson  78 y.o. female  PRE-OPERATIVE DIAGNOSIS:  LEFT HIP FEMEROL NECK FRACTURE   POST-OPERATIVE DIAGNOSIS:  LEFT HIP FEMEROL NECK FRACTURE, displaced  PROCEDURE:  Procedure(s): LEFT HIP HEMI ARTHROPLASTY  (Left)  SURGEON:  Surgeon(s) and Role:    * Shelda Pal, MD - Primary  PHYSICIAN ASSISTANT: Lanney Gins, PA-C  ANESTHESIA:   general  EBL:  Total I/O In: 1000 [I.V.:1000] Out: 200 [Urine:100; Blood:100]  BLOOD ADMINISTERED:none  DRAINS: none   LOCAL MEDICATIONS USED:  NONE  SPECIMEN:  No Specimen  DISPOSITION OF SPECIMEN:  N/A  COUNTS:  YES  TOURNIQUET:  * No tourniquets in log *  DICTATION: .Other Dictation: Dictation Number [id number]  PLAN OF CARE: Admit to inpatient   PATIENT DISPOSITION:  PACU - hemodynamically stable.   Delay start of Pharmacological VTE agent (>24hrs) due to surgical blood loss or risk of bleeding: no

## 2012-12-29 NOTE — Anesthesia Preprocedure Evaluation (Addendum)
Anesthesia Evaluation  Patient identified by MRN, date of birth, ID band Patient awake    Reviewed: Allergy & Precautions, H&P , NPO status , Patient's Chart, lab work & pertinent test results  Airway Mallampati: II TM Distance: >3 FB Neck ROM: Full    Dental no notable dental hx.    Pulmonary neg pulmonary ROS,  breath sounds clear to auscultation  Pulmonary exam normal       Cardiovascular + CAD negative cardio ROS  + Valvular Problems/Murmurs Rhythm:Regular Rate:Normal  Recent lower extremity ultrasound at [facility] was without DVT. ECHO 11-28-12 was good.   Neuro/Psych Anxiety Stroke 7 years ago after meningioma surgery. Neuropathy in hands/feet since stroke is painful.  Neuromuscular disease CVA, Residual Symptoms negative psych ROS   GI/Hepatic Neg liver ROS, GERD-  Medicated,  Endo/Other  negative endocrine ROS  Renal/GU negative Renal ROS  negative genitourinary   Musculoskeletal  (+) Fibromyalgia -  Abdominal   Peds negative pediatric ROS (+)  Hematology negative hematology ROS (+)   Anesthesia Other Findings   Reproductive/Obstetrics negative OB ROS                          Anesthesia Physical Anesthesia Plan  ASA: III  Anesthesia Plan: General   Post-op Pain Management:    Induction: Intravenous  Airway Management Planned: Oral ETT  Additional Equipment:   Intra-op Plan:   Post-operative Plan: Extubation in OR  Informed Consent: I have reviewed the patients History and Physical, chart, labs and discussed the procedure including the risks, benefits and alternatives for the proposed anesthesia with the patient or authorized representative who has indicated his/her understanding and acceptance.   Dental advisory given  Plan Discussed with: CRNA  Anesthesia Plan Comments: (PTT elevated at 43 sec. Plan general.)       Anesthesia Quick Evaluation

## 2012-12-29 NOTE — Plan of Care (Signed)
Problem: Consults Goal: Diagnosis- Total Joint Replacement Hemiarthroplasty     

## 2012-12-29 NOTE — Interval H&P Note (Signed)
History and Physical Interval Note:  12/29/2012 2:56 PM  Samantha Hudson  has presented today for surgery, with the diagnosis of LEFT HIP FEMEROL NECK FRACTURE   The various methods of treatment have been discussed with the patient and family. After consideration of risks, benefits and other options for treatment, the patient has consented to  Procedure(s): LEFT HIP HEMI ARTHROPLASTY  (Left) as a surgical intervention .  The patient's history has been reviewed, patient examined, no change in status, stable for surgery.  I have reviewed the patient's chart and labs.  Questions were answered to the patient's satisfaction.     Shelda Pal

## 2012-12-29 NOTE — Anesthesia Postprocedure Evaluation (Signed)
  Anesthesia Post-op Note  Patient: Samantha Hudson  Procedure(s) Performed: Procedure(s) (LRB): LEFT HIP HEMI ARTHROPLASTY  (Left)  Patient Location: PACU  Anesthesia Type: General  Level of Consciousness: awake and alert   Airway and Oxygen Therapy: Patient Spontanous Breathing  Post-op Pain: mild  Post-op Assessment: Post-op Vital signs reviewed, Patient's Cardiovascular Status Stable, Respiratory Function Stable, Patent Airway and No signs of Nausea or vomiting  Last Vitals:  Filed Vitals:   12/29/12 1800  BP:   Pulse: 76  Temp:   Resp: 12    Post-op Vital Signs: stable   Complications: No apparent anesthesia complications

## 2012-12-29 NOTE — Transfer of Care (Signed)
Immediate Anesthesia Transfer of Care Note  Patient: Samantha Hudson  Procedure(s) Performed: Procedure(s): LEFT HIP HEMI ARTHROPLASTY  (Left)  Patient Location: PACU  Anesthesia Type:General  Level of Consciousness: sedated  Airway & Oxygen Therapy: Patient Spontanous Breathing and Patient connected to nasal cannula oxygen  Post-op Assessment: Report given to PACU RN and Post -op Vital signs reviewed and stable  Post vital signs: stable  Complications: No apparent anesthesia complications

## 2012-12-29 NOTE — Progress Notes (Signed)
Called OR holding and asked that UA be collected when foley placed for surgery.

## 2012-12-30 ENCOUNTER — Ambulatory Visit: Payer: Medicare Other | Admitting: Family Medicine

## 2012-12-30 ENCOUNTER — Encounter (HOSPITAL_COMMUNITY): Payer: Self-pay | Admitting: Orthopedic Surgery

## 2012-12-30 DIAGNOSIS — Z96649 Presence of unspecified artificial hip joint: Secondary | ICD-10-CM

## 2012-12-30 LAB — BASIC METABOLIC PANEL
BUN: 22 mg/dL (ref 6–23)
Calcium: 9.1 mg/dL (ref 8.4–10.5)
Chloride: 99 mEq/L (ref 96–112)
Creatinine, Ser: 0.76 mg/dL (ref 0.50–1.10)
GFR calc Af Amer: >90 mL/min (ref >90)
GFR calc non Af Amer: 80 mL/min — ABNORMAL LOW (ref >90)

## 2012-12-30 LAB — CBC
HCT: 26.6 % — ABNORMAL LOW (ref 36.0–46.0)
MCHC: 32 g/dL (ref 30.0–36.0)
MCV: 90.8 fL (ref 78.0–100.0)
Platelets: 200 10*3/uL (ref 150–400)
RDW: 12.9 % (ref 11.5–15.5)
WBC: 6.3 10*3/uL (ref 4.0–10.5)

## 2012-12-30 MED ORDER — ENSURE PUDDING PO PUDG
1.0000 | ORAL | Status: DC
  Administered 2012-12-30 – 2012-12-31 (×2): 1 via ORAL
  Filled 2012-12-30 (×3): qty 1

## 2012-12-30 MED ORDER — ENSURE COMPLETE PO LIQD
237.0000 mL | ORAL | Status: DC
  Administered 2012-12-30 – 2012-12-31 (×2): 237 mL via ORAL

## 2012-12-30 NOTE — Op Note (Signed)
Samantha Hudson, [place] NO.:  [id number]  MEDICAL RECORD NO.:  [id number]  LOCATION:  1613                         FACILITY:  [facility]  PHYSICIAN:  Madlyn Frankel. Charlann Boxer, M.D.  DATE OF BIRTH:  10/20/1934  DATE OF PROCEDURE:  12/29/2012 DATE OF DISCHARGE:                              OPERATIVE REPORT   PREOPERATIVE DIAGNOSIS:  Displaced left femoral neck fracture.  POSTOPERATIVE DIAGNOSIS:  Displaced left femoral neck fracture.  PROCEDURE:  Left hip hemiarthroplasty.  COMPONENTS USED:  DePuy hip system with size 5 standard Tri-Lock stem with a 48 unipolar ball with the +0 adapter.  SURGEON:  Madlyn Frankel. Charlann Boxer, M.D.  ASSISTANT:  Lanney Gins, PA-C.  ANESTHESIA:  General.  SPECIMENS:  None.  DRAINS:  None.  BLOOD LOSS:  Less than 100 mL.  COMPLICATIONS:  None.  INDICATIONS FOR PROCEDURE:  Ms. Samantha Hudson is a 78 year old female, who had been followed in our office for some time for insufficiency fracture of the pelvis and also distal radius fracture.  She had been seen and followed by one of my partners.  She would notice some increasing pain in the groin with loss of function while working on physical therapy. Radiographs revealed displacement of femoral neck fracture that had not previously been diagnosed.  She was referred for surgical consideration. After reviewing with she and her husband her current diagnosis, the plan was for admission for an elective left hip hemiarthroplasty.  We discussed the risks of infection, DVT, component failure.  She already had significant bilateral lower extremity edematous changes.  We discussed hemi versus total hip arthroplasty and the potential need for conversion to a total hip arthroplasty.  Consent was obtained for benefit of pain relief after reviewing these risks.  PROCEDURE IN DETAIL:  Patient was brought to operative theater.  Once adequate anesthesia, preoperative antibiotics, clindamycin administered, she was  positioned into the right lateral decubitus position with left side up.  The left lower extremity was then prepped and draped in a sterile fashion.  Time-out was performed identifying the patient, planned procedure, and extremity.  Lateral based incision was made for posterior approach to the hip.  The iliotibial band and gluteal fascia were exposed and incised for posterior approach.  Short external rotators were taken down with the posterior capsule.  The hip was internally rotated.  Fracture identified.  At this point, the neck osteotomy was made in the trochanteric fossa. The fractured femoral neck and head were removed and measured on the back table to be 48 mm in diameter.  With the proximal femur exposed, internally rotated and flexed, I then opened up the proximal femur with the drill, hand reamed and then irrigated to try to open the fat emboli with broaching.  I broached from starting chili pepper broach to a size 5 broach with good medial and lateral metaphyseal fit.  A trial reduction was carried out with standard neck, 48 ball and 0 adapter.  The hip reduced nicely.  Leg lengths appeared to be comparable to the down leg as compared to the preoperative state.  There was no evidence of impingement with external rotation, extension or with forward flexion, internal rotation to 80 degrees.  Given all these findings, the trial components were removed.  The final size 5 standard Tri-Lock stem was opened and was subsequently impacted into the canal at the set anteversion from trialing.  Based on the trial reduction, a 48 unipolar ball was opened.  The 0 adapter opened and impacted into the 48 ball and then onto a clean and dry trunnion.  The hip was irrigated throughout the case.  Again, at this point, I reapproximated posterior capsule and superior capsule using #1 Vicryl. The iliotibial band and gluteal fascia were then reapproximated using a #1 Vicryl and 0 running V-Loc.   The remainder of wound was closed with 2- 0 Vicryl and running 4-0 Monocryl.  The hip was cleaned, dried, and dressed sterilely using Dermabond and Aquacel dressing.  She was then extubated and brought to the recovery room in stable condition.  She will be weightbearing as tolerated.  I will see her back in the office in 2 weeks for routine followup.     Madlyn Frankel Charlann Boxer, M.D.     MDO/MEDQ  D:  12/29/2012  T:  12/30/2012  Job:  [id number]

## 2012-12-30 NOTE — Progress Notes (Signed)
   Subjective: 1 Day Post-Op Procedure(s) (LRB): LEFT HIP HEMI ARTHROPLASTY  (Left)   Patient reports pain as mild, pain well controlled. Difficulty controlling pain throughout the night, much better this morning.   Objective:   VITALS:   Filed Vitals:   12/30/12 0455  BP: 113/57  Pulse: 81  Temp: 98.9 F (37.2 C)  Resp: 20    Neurovascular intact Dorsiflexion/Plantar flexion intact Incision: dressing C/D/I No cellulitis present Compartment soft  LABS  Recent Labs  12/29/12 1245 12/30/12 0425  HGB 10.8* 8.4*  HCT 33.8* 26.6*  WBC 4.6 6.3  PLT 240 200     Recent Labs  12/29/12 1245 12/30/12 0425  NA 138 136  K 3.5 3.5  BUN 28* 22  CREATININE 0.88 0.76  GLUCOSE 80 143*     Assessment/Plan: 1 Day Post-Op Procedure(s) (LRB): LEFT HIP HEMI ARTHROPLASTY  (Left) HV drain d/c'ed Foley cath d/c'ed Advance diet Up with therapy D/C IV fluids Discharge to SNF / home when ready. Placed SW consult  Expected ABLA  Treated with iron and will observe  Underweight (BMI <18.5) Estimated body mass index is 17 kg/(m^2) as calculated from the following:   Height as of this encounter: 5' 5.5" (1.664 m).   Weight as of this encounter: 47.083 kg (103 lb 12.8 oz). Patient also counseled that weight may inhibit the healing process Patient counseled that losing weight will help with future health issues        Samantha Hudson. Samantha Hudson   PAC  12/30/2012, 9:57 AM

## 2012-12-30 NOTE — Progress Notes (Signed)
Pt's daughter does not want foley taken out at this time.  States that pt urinates about every 40 minutes at home and uses depends.  Told daughter that we will help her to the bedpan and would be in to see her every hour to help.  Daughter still refuses due to lack of mobility.  Pt dangled, tolerated okay.  Will continue to monitor.  Samantha Hudson

## 2012-12-30 NOTE — Evaluation (Signed)
Occupational Therapy Evaluation Patient Details Name: Samantha Hudson MRN: [id number] DOB: 08/13/1934 Today's Date: 12/30/2012 Time: 4098-1191 OT Time Calculation (min): 29 min  OT Assessment / Plan / Recommendation History of present illness 78 yo female s/p L hip hemiarthroplasty 12/29/12. hx of wrist fx Feb 2014, sacral stress fx, osteoporosis, firbromyalgia, CVA with residual L strength/ROM deficits and increased tone.    Clinical Impression   Pt currently limited by pain and residual strength and ROM deficits from history of CVA. She will benefit from skilled OT services to maximize ADL independence for next venue of care.     OT Assessment  Patient needs continued OT Services    Follow Up Recommendations  SNF;Supervision/Assistance - 24 hour    Barriers to Discharge      Equipment Recommendations  None recommended by OT    Recommendations for Other Services    Frequency  Min 2X/week    Precautions / Restrictions Precautions Precautions: Posterior Hip;Fall Precaution Booklet Issued: Yes (comment) Precaution Comments: PT reviewed precautions with pt and family Restrictions Weight Bearing Restrictions: No LLE Weight Bearing: Weight bearing as tolerated Other Position/Activity Restrictions: WBAT   Pertinent Vitals/Pain 9/10; reposition.    ADL  Eating/Feeding: Simulated;Independent Where Assessed - Eating/Feeding: Bed level Grooming: Simulated;Wash/dry hands;Set up Where Assessed - Grooming: Supported sitting Upper Body Bathing: Simulated;Chest;Right arm;Left arm;Abdomen;Supervision/safety;Set up Where Assessed - Upper Body Bathing: Unsupported sitting Lower Body Bathing: Simulated;+2 Total assistance Lower Body Bathing: Patient Percentage: 30% Where Assessed - Lower Body Bathing: Supported sit to stand Upper Body Dressing: Simulated;Minimal assistance Where Assessed - Upper Body Dressing: Unsupported sitting Lower Body Dressing: Simulated;+2 Total assistance Lower  Body Dressing: Patient Percentage: 10% Where Assessed - Lower Body Dressing: Supported sit to stand Toilet Transfer: Simulated;+2 Total assistance Toilet Transfer: Patient Percentage: 40% (pivot 50%) Toilet Transfer Method: Sit to stand;Stand pivot Toileting - Clothing Manipulation and Hygiene: Simulated;+2 Total assistance Toileting - Clothing Manipulation and Hygiene: Patient Percentage: 0% (not able to free hands from RW yet for functional reach) Where Assessed - Engineer, mining and Hygiene: Standing Equipment Used: Rolling walker ADL Comments: Note pt very shaky during bed mobility and pt states she gets nervous with moving around. She states she had tremors at home that have gotten more noticeable recently.     OT Diagnosis: Generalized weakness;Acute pain  OT Problem List: Decreased strength;Decreased activity tolerance;Decreased knowledge of use of DME or AE;Decreased knowledge of precautions;Pain OT Treatment Interventions: Self-care/ADL training;DME and/or AE instruction;Therapeutic activities;Patient/family education   OT Goals(Current goals can be found in the care plan section) Acute Rehab OT Goals Patient Stated Goal: to walk OT Goal Formulation: With patient/family Time For Goal Achievement: 01/06/13 Potential to Achieve Goals: Good  Visit Information  Last OT Received On: 12/30/12 Assistance Needed: +2 PT/OT Co-Evaluation/Treatment: Yes History of Present Illness: 78 yo female s/p L hip hemiarthroplasty 12/29/12. hx of wrist fx Feb 2014, sacral stress fx, osteoporosis, firbromyalgia, CVA with residual L strength/ROM deficits and increased tone.        Prior Functioning     Home Living Family/patient expects to be discharged to:: Private residence Living Arrangements: Spouse/significant other Type of Home: House Home Access: Stairs to enter [employer] of Steps: 3 Entrance Stairs-Rails: Right Home Layout: One level Home Equipment:  Bedside commode;Wheelchair - manual;Shower seat;Grab bars - toilet;Grab bars - tub/shower;Walker - 2 wheels Additional Comments: transport chair, toilet riser Prior Function Comments: has been needing assist the last 2 weeks or so with bathing/dressing, using BSC more  recently. Communication Communication: No difficulties         Vision/Perception     Cognition  Cognition Arousal/Alertness: Awake/alert Behavior During Therapy: WFL for tasks assessed/performed Overall Cognitive Status: Within Functional Limits for tasks assessed    Extremity/Trunk Assessment Upper Extremity Assessment Upper Extremity Assessment: Generalized weakness (grossly 3+/5 throughout. history R wrist fracture. tremors)     Mobility Bed Mobility Bed Mobility: Supine to Sit Supine to Sit: HOB elevated;1: +2 Total assist Supine to Sit: Patient Percentage: 20% Details for Bed Mobility Assistance: Assist for trunk to upright and LEs over to EOB. Use of pad to scoot around toward EOB. Transfers Transfers: Sit to Stand;Stand to Sit Sit to Stand: 1: +2 Total assist;With upper extremity assist;From bed Sit to Stand: Patient Percentage: 40% Stand to Sit: 1: +2 Total assist;With upper extremity assist;To chair/3-in-1 Stand to Sit: Patient Percentage: 40% Details for Transfer Assistance: Assist to rise and steady from EOB. pt able to make steps to turn but then chair pulled up behind her to sit. Pt stating she was starting to get more dizzy. Manual assist for hand placement on chair. Assist to control descent and L LE management.     Exercise     Balance     End of Session OT - End of Session Equipment Utilized During Treatment: Gait belt Activity Tolerance: Patient limited by pain Patient left: in chair;with call bell/phone within reach;with family/visitor present  GO     [name] [phone] 12/30/2012, 11:14 AM

## 2012-12-30 NOTE — Progress Notes (Signed)
INITIAL NUTRITION ASSESSMENT  DOCUMENTATION CODES Per approved criteria  -Underweight   INTERVENTION: Provide Ensure Complete once daily Provide Ensure Pudding once daily Encourage PO intake  NUTRITION DIAGNOSIS: Increased nutrient needs related to underweight status as evidenced by BMI of 17.   Goal: Pt to meet >/= 90% of their estimated nutrition needs   Monitor:  PO intake Weight Labs  Reason for Assessment: Consult  78 y.o. female  Admitting Dx: S/P hip hemiarthroplasty  ASSESSMENT: 78 yo female s/p L hip hemiarthroplasty 12/29/12. hx of wrist fx Feb 2014, sacral stress fx, osteoporosis, firbromyalgia, and CVA. Pt denies wt loss stating her usual body weight is around 103 lbs. Pt reports that her appetite is normal and she was eating normally PTA with 3 meals daily, one Ensure supplement most days, and a cliff bar once daily. Pt states she eats well she just doesn't gain weight. Encouraged pt to continue eating >75% of 3 meals daily with one Ensure daily to help aid recovery.   Height: Ht Readings from Last 1 Encounters:  12/29/12 5' 5.5" (1.664 m)    Weight: Wt Readings from Last 1 Encounters:  12/29/12 103 lb 12.8 oz (47.083 kg)    Ideal Body Weight: 127 lbs  % Ideal Body Weight: 81%  Wt Readings from Last 10 Encounters:  12/29/12 103 lb 12.8 oz (47.083 kg)  12/29/12 103 lb 12.8 oz (47.083 kg)  12/15/12 102 lb 12.8 oz (46.63 kg)  12/09/12 102 lb 9.6 oz (46.539 kg)  11/25/12 102 lb (46.267 kg)  10/27/12 103 lb 3.2 oz (46.811 kg)  10/08/12 102 lb 12.8 oz (46.63 kg)  09/25/12 100 lb 12.8 oz (45.723 kg)  07/05/12 100 lb (45.36 kg)  09/24/11 98 lb 6 oz (44.623 kg)    Usual Body Weight: 103 lbs  % Usual Body Weight: 100%  BMI:  Body mass index is 17 kg/(m^2).  Estimated Nutritional Needs: Kcal: 1400-1600 Protein: 50-60 grams Fluid: 1.9 L  Skin: incision on hip  Diet Order: General  EDUCATION NEEDS: -No education needs identified at this  time   Intake/Output Summary (Last 24 hours) at 12/30/12 1331 Last data filed at 12/30/12 1255  Gross per 24 hour  Intake 2503.33 ml  Output   1605 ml  Net 898.33 ml    Last BM: 8/10  Labs:   Recent Labs Lab 12/29/12 1245 12/30/12 0425  NA 138 136  K 3.5 3.5  CL 97 99  CO2 37* 31  BUN 28* 22  CREATININE 0.88 0.76  CALCIUM 10.3 9.1  GLUCOSE 80 143*    CBG (last 3)  No results found for this basename: GLUCAP,  in the last 72 hours  Scheduled Meds: . aspirin EC  325 mg Oral Q breakfast  . atenolol  25 mg Oral BID  . atorvastatin  10 mg Oral QHS  . dipyridamole-aspirin  1 capsule Oral BID  . docusate sodium  100 mg Oral BID  . DULoxetine  30 mg Oral q morning - 10a  . famotidine  40 mg Oral QHS  . ferrous sulfate  325 mg Oral TID PC  . gabapentin  400 mg Oral TID  . polyethylene glycol  17 g Oral Daily  . potassium chloride  10 mEq Oral BID  . torsemide  40 mg Oral QAC breakfast   And  . torsemide  20 mg Oral Q1200  . traMADol  50 mg Oral QID    Continuous Infusions: . sodium chloride 0.9 % 1,000 mL  with potassium chloride 10 mEq infusion 50 mL/hr at 12/29/12 2120    Past Medical History  Diagnosis Date  . Hyperlipemia   . IBS (irritable bowel syndrome)   . Osteoporosis   . Fibromyalgia   . Glaucoma     HAD LASER SURGERY - NO EYE DROPS REQUIRED  . Globus hystericus   . Fibromyalgia   . Meningioma   . CAD (coronary artery disease)     HEART STENT PLACED ABOUT 2000- NO LONGER SEES CARDIOLOGIST; NO C/O OF CHEST PAINS OR SOB  . Heart murmur     MVP SINCE THE 60'S - TAKES ATENOLOL -  . Anxiety   . Swelling     BILATERAL FEET AND LEGS - ONSET OF SWELLING APRIL 2014  . GERD (gastroesophageal reflux disease)   . Arthritis   . Fracture   . CVA (cerebral vascular accident)     STROKE 7 YRS AGO - DAY AFTER BRAIN SURGERY TO REMOVE BENIGN MENINGIOMA - HAS LEFT SIDED WEAKNESS AND A LITTLE NUMBNESS  . Pain     "EXCRUCIATING PAIN" LEFT HIP - HAS A FRACTURE -  IS CONFINED TO W/C AT HOME - NO WEIGHT BEARING LEFT HIP.    Past Surgical History  Procedure Laterality Date  . Brain surgery      tumor removed  . Tonsillectomy    . Partial hysterectomy    . Glaucoma repair    . Cataract extraction    . Kidney surgery      left - HX ENLARGED LEFT KIDNEY ON XRAY - SURGERY WAS DONE ON URETER   . Colonoscopy    . Coronary angioplasty    . Hip arthroplasty Left 12/29/2012    Procedure: LEFT HIP HEMI ARTHROPLASTY ;  Surgeon: Shelda Pal, MD;  Location: [facility];  Service: Orthopedics;  Laterality: Left;    Ian Malkin RD, LDN Inpatient Clinical Dietitian Pager: [phone] After Hours Pager: [phone]

## 2012-12-30 NOTE — Progress Notes (Signed)
Physical Therapy Treatment Patient Details Name: Samantha Hudson MRN: [id number] DOB: 09-Nov-1934 Today's Date: 12/30/2012 Time: 7829-5621 PT Time Calculation (min): 23 min  PT Assessment / Plan / Recommendation  History of Present Illness 78 yo female s/p L hip hemiarthroplasty 12/29/12. hx of wrist fx Feb 2014, sacral stress fx, osteoporosis, firbromyalgia, CVA with residual L strength/ROM deficits and increased tone.    PT Comments   Progressing slowly with mobility. Continues to require increased time for tasks and +2 assist. Recommend SNF.   Follow Up Recommendations  SNF     Does the patient have the potential to tolerate intense rehabilitation     Barriers to Discharge        Equipment Recommendations  None recommended by PT    Recommendations for Other Services OT consult  Frequency Min 3X/week   Progress towards PT Goals Progress towards PT goals: Progressing toward goals  Plan Frequency needs to be updated    Precautions / Restrictions Precautions Precautions: Posterior Hip;Fall Precaution Booklet Issued: Yes (comment) Precaution Comments: PT reviewed precautions with pt and family Restrictions Weight Bearing Restrictions: No LLE Weight Bearing: Weight bearing as tolerated   Pertinent Vitals/Pain 7-8/10 L hip    Mobility  Bed Mobility Bed Mobility: Supine to Sit;Sit to Supine Supine to Sit: 1: +2 Total assist Supine to Sit: Patient Percentage: 20% Sit to Supine: 1: +2 Total assist Sit to Supine: Patient Percentage: 10% Details for Bed Mobility Assistance: Assist for trunk to upright and LEs over to EOB. Use of pad to scoot around toward EOB. Increased time.  Transfers Transfers: Sit to Stand;Stand to Sit Sit to Stand: 1: +2 Total assist;From bed Sit to Stand: Patient Percentage: 40% Stand to Sit: 1: +2 Total assist;To bed Stand to Sit: Patient Percentage: 40% Details for Transfer Assistance: Assist to rise, stabilize, control descent. VCS safety, technique,  hand placement, LE positioning.  Ambulation/Gait Ambulation/Gait Assistance: 1: +2 Total assist Ambulation/Gait: Patient Percentage: 50% Ambulation Distance (Feet): 17 Feet Assistive device: Rolling walker Ambulation/Gait Assistance Details: Multimodal cues for safety, technique, sequencing. Assist to stabilize/support pt in standing and to maneuver with RW. fatigues fiarly easily.  Gait Pattern: Step-to pattern;Left flexed knee in stance;Trunk flexed;Narrow base of support;Decreased dorsiflexion - left;Decreased step length - left;Decreased step length - right;Decreased stride length    Exercises     PT Diagnosis:    PT Problem List:   PT Treatment Interventions:     PT Goals (current goals can now be found in the care plan section)    Visit Information  Last PT Received On: 12/30/12 Assistance Needed: +2 History of Present Illness: 78 yo female s/p L hip hemiarthroplasty 12/29/12. hx of wrist fx Feb 2014, sacral stress fx, osteoporosis, firbromyalgia, CVA with residual L strength/ROM deficits and increased tone.     Subjective Data      Cognition       Balance     End of Session PT - End of Session Activity Tolerance: Patient limited by fatigue;Patient limited by pain Patient left: in bed;with call bell/phone within reach;with family/visitor present   GP     Rebeca Alert, MPT Pager: [phone]

## 2012-12-30 NOTE — Progress Notes (Signed)
Clinical Social Work Department CLINICAL SOCIAL WORK PLACEMENT NOTE 12/30/2012  Patient:  Samantha Hudson, Samantha Hudson  Account Number:  [id number] Admit date:  12/29/2012  Clinical Social Worker:  Cori Razor, LCSW  Date/time:  12/30/2012 01:27 PM  Clinical Social Work is seeking post-discharge placement for this patient at the following level of care:   SKILLED NURSING   (*CSW will update this form in Epic as items are completed)   12/30/2012  Patient/family provided with [facility] Department of Clinical Social Work's list of facilities offering this level of care within the geographic area requested by the patient (or if unable, by the patient's family).  12/30/2012  Patient/family informed of their freedom to choose among providers that offer the needed level of care, that participate in Medicare, Medicaid or managed care program needed by the patient, have an available bed and are willing to accept the patient.  12/30/2012  Patient/family informed of [facility]' ownership interest in [facility], as well as of the fact that they are under no obligation to receive care at this facility.  PASARR submitted to EDS on 12/30/2012 PASARR number received from EDS on 12/30/2012  FL2 transmitted to all facilities in geographic area requested by pt/family on  12/30/2012 FL2 transmitted to all facilities within larger geographic area on   Patient informed that his/her managed care company has contracts with or will negotiate with  certain facilities, including the following:     Patient/family informed of bed offers received:   Patient chooses bed at  Physician recommends and patient chooses bed at    Patient to be transferred to  on   Patient to be transferred to facility by   The following physician request were entered in Epic:   Additional Comments:  Cori Razor LCSW [phone]

## 2012-12-30 NOTE — Progress Notes (Signed)
Clinical Social Work Department BRIEF PSYCHOSOCIAL ASSESSMENT 12/30/2012  Patient:  Samantha Hudson, Samantha Hudson     Account Number:  [id number]     Admit date:  12/29/2012  Clinical Social Worker:  Candie Chroman  Date/Time:  12/30/2012 01:17 PM  Referred by:  Physician  Date Referred:  12/30/2012 Referred for  SNF Placement   Other Referral:   Interview type:  Patient Other interview type:    PSYCHOSOCIAL DATA Living Status:  HUSBAND Admitted from facility:   Level of care:   Primary support name:  Paulene Floor Primary support relationship to patient:  SPOUSE Degree of support available:   supportive    CURRENT CONCERNS Current Concerns  Post-Acute Placement   Other Concerns:    SOCIAL WORK ASSESSMENT / PLAN Pt is a 78 yr old female living at home prior to hospitalization. CSW met with pt and family to assist with d/c planning. SNF placement will be needed following hospital d/c. CSW has initiated SNF search and bed offers will be provided to pt/family this afternoon. CSW will continue to follow to assist with d/c planning.   Assessment/plan status:  Psychosocial Support/Ongoing Assessment of Needs Other assessment/ plan:   Information/referral to community resources:   SNF list provided.    PATIENT'S/FAMILY'S RESPONSE TO PLAN OF CARE: Pt/family have several SNFs they are interested in for placement. Pt would prfer to go home but will accept ST Rehab if needed.    Cori Razor LCSW [phone]

## 2012-12-30 NOTE — Progress Notes (Signed)
Utilization review completed.  

## 2012-12-30 NOTE — Evaluation (Signed)
Physical Therapy Evaluation Patient Details Name: Samantha Hudson MRN: [id number] DOB: 03-04-1935 Today's Date: 12/30/2012 Time: 9147-8295 PT Time Calculation (min): 31 min  PT Assessment / Plan / Recommendation History of Present Illness  78 yo female s/p L hip hemiarthroplasty 12/29/12. hx of wrist fx Feb 2014, sacral stress fx, osteoporosis, firbromyalgia, CVA with residual L strength/ROM deficits and increased tone.   Clinical Impression  On eval, pt required +2 assist for mobility-able to stand pivot/take a few steps to recliner with RW. Recommend SNF for ST rehab to improve strength, ROM, balance and gait.     PT Assessment  Patient needs continued PT services    Follow Up Recommendations  SNF    Does the patient have the potential to tolerate intense rehabilitation      Barriers to Discharge        Equipment Recommendations  None recommended by PT    Recommendations for Other Services OT consult   Frequency Min 4X/week    Precautions / Restrictions Precautions Precautions: Posterior Hip;Fall Precaution Booklet Issued: Yes (comment) Precaution Comments: PT reviewed precautions with pt and family Restrictions Weight Bearing Restrictions: No LLE Weight Bearing: Weight bearing as tolerated Other Position/Activity Restrictions: WBAT   Pertinent Vitals/Pain L hip 5/10      Mobility  Bed Mobility Bed Mobility: Supine to Sit Supine to Sit: 1: +2 Total assist Supine to Sit: Patient Percentage: 20% Details for Bed Mobility Assistance: Assist for trunk to upright and LEs over to EOB. Use of pad to scoot around toward EOB. Increased time.  Transfers Transfers: Sit to Stand;Stand to Sit;Stand Pivot Transfers Sit to Stand: 1: +2 Total assist;From bed Sit to Stand: Patient Percentage: 40% Stand to Sit: 1: +2 Total assist;To chair/3-in-1 Stand to Sit: Patient Percentage: 40% Stand Pivot Transfers: 1: +2 Total assist Stand Pivot Transfers: Patient Percentage: 50% Details  for Transfer Assistance: Assist to rise and steady from EOB. pt able to make steps to turn but then chair pulled up behind her to sit. Pt stating she was starting to get more dizzy. Manual assist for hand placement on chair. Assist to control descent and L LE management. Ambulation/Gait Ambulation Distance (Feet): 2 Feet Assistive device: Rolling walker Ambulation/Gait Assistance Details: Multimodal cues for safety, technique, sequence.  Gait Pattern: Step-to pattern;Left flexed knee in stance;Trunk flexed;Narrow base of support;Decreased dorsiflexion - left;Decreased step length - left;Decreased step length - right;Decreased stride length    Exercises     PT Diagnosis: Difficulty walking;Abnormality of gait;Generalized weakness;Acute pain;Other (comment) (hemiparesis L side-residual from old CVA)  PT Problem List: Decreased strength;Decreased range of motion;Decreased mobility;Pain;Decreased knowledge of precautions;Decreased activity tolerance;Decreased balance;Decreased knowledge of use of DME;Impaired tone PT Treatment Interventions: DME instruction;Gait training;Functional mobility training;Therapeutic activities;Therapeutic exercise;Balance training;Patient/family education     PT Goals(Current goals can be found in the care plan section) Acute Rehab PT Goals Patient Stated Goal: to walk PT Goal Formulation: With patient/family Time For Goal Achievement: 01/13/13 Potential to Achieve Goals: Fair  Visit Information  Last PT Received On: 12/30/12 Assistance Needed: +2 History of Present Illness: 78 yo female s/p L hip hemiarthroplasty 12/29/12. hx of wrist fx Feb 2014, sacral stress fx, osteoporosis, firbromyalgia, CVA with residual L strength/ROM deficits and increased tone.        Prior Functioning  Home Living Family/patient expects to be discharged to:: Private residence Living Arrangements: Spouse/significant other Type of Home: House Home Access: Stairs to enter [employer] of Steps: 3 Entrance Stairs-Rails: Right Home Layout: One  level Home Equipment: Bedside commode;Wheelchair - manual;Shower seat;Grab bars - toilet;Grab bars - tub/shower;Walker - 2 wheels Additional Comments: transport chair, toilet riser Prior Function Comments: has been needing assist the last 2 weeks or so with bathing/dressing, using BSC more recently. Communication Communication: No difficulties    Cognition  Cognition Arousal/Alertness: Awake/alert Behavior During Therapy: WFL for tasks assessed/performed Overall Cognitive Status: Within Functional Limits for tasks assessed    Extremity/Trunk Assessment Upper Extremity Assessment Upper Extremity Assessment: Generalized weakness Lower Extremity Assessment Lower Extremity Assessment: RLE deficits/detail;LLE deficits/detail RLE Deficits / Details: Strength at least 3+/5 throughout.  LLE Deficits / Details: Increased tone noted throughout L LE. Hip/knee flexion contracture ~45 degrees. DF < neutral.  LLE Coordination: decreased gross motor   Balance    End of Session PT - End of Session Equipment Utilized During Treatment: Gait belt Activity Tolerance: Patient limited by fatigue;Patient limited by pain Patient left: in chair;with call bell/phone within reach;with family/visitor present  GP     Rebeca Alert, MPT Pager: [phone]

## 2012-12-31 ENCOUNTER — Inpatient Hospital Stay (HOSPITAL_COMMUNITY): Payer: Medicare Other

## 2012-12-31 LAB — BASIC METABOLIC PANEL
BUN: 24 mg/dL — ABNORMAL HIGH (ref 6–23)
Chloride: 101 mEq/L (ref 96–112)
GFR calc Af Amer: 58 mL/min — ABNORMAL LOW (ref >90)
GFR calc non Af Amer: 50 mL/min — ABNORMAL LOW (ref >90)
Potassium: 3.2 mEq/L — ABNORMAL LOW (ref 3.5–5.1)
Sodium: 140 mEq/L (ref 135–145)

## 2012-12-31 LAB — CBC
MCHC: 33.2 g/dL (ref 30.0–36.0)
Platelets: 177 10*3/uL (ref 150–400)
RDW: 13.2 % (ref 11.5–15.5)
WBC: 6.7 10*3/uL (ref 4.0–10.5)

## 2012-12-31 MED ORDER — POTASSIUM CHLORIDE CRYS ER 20 MEQ PO TBCR
40.0000 meq | EXTENDED_RELEASE_TABLET | Freq: Two times a day (BID) | ORAL | Status: DC
  Administered 2012-12-31 – 2013-01-01 (×2): 40 meq via ORAL
  Filled 2012-12-31 (×3): qty 2

## 2012-12-31 MED ORDER — POTASSIUM CHLORIDE ER 10 MEQ PO TBCR: 10.0000 meq | EXTENDED_RELEASE_TABLET | Freq: Two times a day (BID) | ORAL | Status: DC

## 2012-12-31 NOTE — Progress Notes (Signed)
   Subjective: 2 Days Post-Op Procedure(s) (LRB): LEFT HIP HEMI ARTHROPLASTY  (Left)   Patient reports pain as mild, pain controlled. Little slow on the left side from previous stroke. States that she was able to get up with PT and walk out into the hallway. Had a long discussion regarding her having to build up her endurance and that she may be a little slower recovery because of the stroke symptoms.  Objective:   VITALS:   Filed Vitals:   12/31/12 0600  BP: 94/53  Pulse: 83  Temp: 98.8 F (37.1 C)   Resp: 18    Neurovascular intact Dorsiflexion/Plantar flexion intact Incision: dressing C/D/I No cellulitis present Compartment soft  LABS  Recent Labs  12/29/12 1245 12/30/12 0425 12/31/12 0345  HGB 10.8* 8.4* 7.8*  HCT 33.8* 26.6* 23.5*  WBC 4.6 6.3 6.7  PLT 240 200 177     Recent Labs  12/29/12 1245 12/30/12 0425 12/31/12 0345  NA 138 136 140  K 3.5 3.5 3.2*  BUN 28* 22 24*  CREATININE 0.88 0.76 1.06  GLUCOSE 80 143* 123*     Assessment/Plan: 2 Days Post-Op Procedure(s) (LRB): LEFT HIP HEMI ARTHROPLASTY  (Left) Up with therapy Discharge to SNF eventually when ready  Expected ABLA  Treated with iron and will observe   Underweight (BMI <18.5)  Estimated body mass index is 17 kg/(m^2) as calculated from the following:      Height as of this encounter: 5' 5.5" (1.664 m).      Weight as of this encounter: 47.083 kg (103 lb 12.8 oz).  Patient also counseled that weight may inhibit the healing process  Patient counseled that losing weight will help with future health issues      Anastasio Auerbach. [place]   PAC  12/31/2012, 2:24 PM

## 2012-12-31 NOTE — Progress Notes (Signed)
pts Hgb 7.8/K+ 3.2. Will leave Foley in until MD rounds in case of Blood Admin

## 2012-12-31 NOTE — Progress Notes (Signed)
Physical Therapy Treatment Patient Details Name: Samantha Hudson MRN: [id number] DOB: 1934-11-01 Today's Date: 12/31/2012 Time: 4098-1191 PT Time Calculation (min): 19 min  PT Assessment / Plan / Recommendation  History of Present Illness 78 yo female s/p L hip hemiarthroplasty 12/29/12. hx of wrist fx Feb 2014, sacral stress fx, osteoporosis, firbromyalgia, CVA with residual L strength/ROM deficits and increased tone.    PT Comments   Pt did tolerate a short ambulation. Increased tone LLE and leg tends to IR and add. Pillow placed between legs in bed.  Follow Up Recommendations  SNF     Does the patient have the potential to tolerate intense rehabilitation     Barriers to Discharge        Equipment Recommendations  None recommended by PT    Recommendations for Other Services    Frequency Min 3X/week   Progress towards PT Goals Progress towards PT goals: Progressing toward goals  Plan Current plan remains appropriate    Precautions / Restrictions Precautions Precautions: Posterior Hip;Fall Restrictions LLE Weight Bearing: Weight bearing as tolerated        Mobility  Bed Mobility Sit to Supine: 1: +2 Total assist Sit to Supine: Patient Percentage: 10% Details for Bed Mobility Assistance: Assist for trunk to lie down and place legs onto bed..  Transfers Sit to Stand: 1: +2 Total assist;From chair/3-in-1 Sit to Stand: Patient Percentage: 40% Stand to Sit: 1: +2 Total assist;To bed Stand to Sit: Patient Percentage: 40% Stand Pivot Transfers: 1: +2 Total assist Stand Pivot Transfers: Patient Percentage: 40% Details for Transfer Assistance: Pt assited to pivot from [facility] to recliner with armhold as pt could not use RW. Ambulation/Gait Ambulation/Gait Assistance: 1: +2 Total assist Ambulation/Gait: Patient Percentage: 50% Ambulation Distance (Feet): 17 Feet Assistive device: Rolling walker Ambulation/Gait Assistance Details: multi modal cues to keep LLE from addducting, pt  was able to advance LLE 90% but scissoring.    Exercises     PT Diagnosis:    PT Problem List:   PT Treatment Interventions:     PT Goals (current goals can now be found in the care plan section)    Visit Information  Last PT Received On: 12/31/12 Assistance Needed: +2 History of Present Illness: 78 yo female s/p L hip hemiarthroplasty 12/29/12. hx of wrist fx Feb 2014, sacral stress fx, osteoporosis, firbromyalgia, CVA with residual L strength/ROM deficits and increased tone.     Subjective Data      Cognition  Cognition Arousal/Alertness: Awake/alert Overall Cognitive Status: History of cognitive impairments - at baseline Memory: Decreased recall of precautions    Balance     End of Session PT - End of Session Equipment Utilized During Treatment: Gait belt Activity Tolerance: Patient limited by pain;Patient tolerated treatment well Patient left: in bed;with call bell/phone within reach;with family/visitor present Nurse Communication: Mobility status   GP     [name] 12/31/2012, 4:18 PM

## 2012-12-31 NOTE — Progress Notes (Signed)
Physical Therapy Treatment Patient Details Name: Samantha Hudson MRN: [id number] DOB: 03-10-1935 Today's Date: 12/31/2012 Time: 4098-1191 PT Time Calculation (min): 9 min  PT Assessment / Plan / Recommendation  History of Present Illness 79 yo female s/p L hip hemiarthroplasty 12/29/12. hx of wrist fx Feb 2014, sacral stress fx, osteoporosis, firbromyalgia, CVA with residual L strength/ROM deficits and increased tone.    PT Comments   Pt having difficulty at RW. Flexed posture. 2 total assit for transfer.  Follow Up Recommendations  SNF     Does the patient have the potential to tolerate intense rehabilitation     Barriers to Discharge        Equipment Recommendations  None recommended by PT    Recommendations for Other Services    Frequency Min 3X/week   Progress towards PT Goals Progress towards PT goals: Progressing toward goals  Plan      Precautions / Restrictions Precautions Precautions: Posterior Hip;Fall Restrictions LLE Weight Bearing: Weight bearing as tolerated   Pertinent Vitals/Pain L hip is sore.    Mobility  Transfers Sit to Stand: 1: +2 Total assist;From chair/3-in-1 Sit to Stand: Patient Percentage: 40% Stand to Sit: 1: +2 Total assist;To chair/3-in-1 Stand to Sit: Patient Percentage: 40% Stand Pivot Transfers: 1: +2 Total assist Stand Pivot Transfers: Patient Percentage: 40% Details for Transfer Assistance: Pt assited to pivot from [facility] to recliner with armhold as pt could not use RW. Ambulation/Gait Ambulation/Gait Assistance: Not tested (comment)    Exercises     PT Diagnosis:    PT Problem List:   PT Treatment Interventions:     PT Goals (current goals can now be found in the care plan section)    Visit Information  Last PT Received On: 12/31/12 Assistance Needed: +2 History of Present Illness: 78 yo female s/p L hip hemiarthroplasty 12/29/12. hx of wrist fx Feb 2014, sacral stress fx, osteoporosis, firbromyalgia, CVA with residual L  strength/ROM deficits and increased tone.     Subjective Data      Cognition  Cognition Arousal/Alertness: Awake/alert Overall Cognitive Status: History of cognitive impairments - at baseline Memory: Decreased recall of precautions    Balance     End of Session PT - End of Session Equipment Utilized During Treatment: Gait belt Activity Tolerance: Patient limited by fatigue;Patient limited by pain Patient left: with call bell/phone within reach;with family/visitor present;in chair Nurse Communication: Mobility status   GP     Samantha Hudson 12/31/2012, 2:51 PM

## 2013-01-01 LAB — CBC
Hemoglobin: 8.5 g/dL — ABNORMAL LOW (ref 12.0–15.0)
MCH: 28.9 pg (ref 26.0–34.0)
MCV: 91.2 fL (ref 78.0–100.0)
RBC: 2.94 MIL/uL — ABNORMAL LOW (ref 3.87–5.11)
WBC: 8 10*3/uL (ref 4.0–10.5)

## 2013-01-01 MED ORDER — HYDROCODONE-ACETAMINOPHEN 5-325 MG PO TABS: 1.0000 | ORAL_TABLET | ORAL | Status: DC | PRN

## 2013-01-01 MED ORDER — FERROUS SULFATE 325 (65 FE) MG PO TABS: 325.0000 mg | ORAL_TABLET | Freq: Three times a day (TID) | ORAL | Status: DC

## 2013-01-01 MED ORDER — POLYETHYLENE GLYCOL 3350 17 G PO PACK: 17.0000 g | PACK | Freq: Every day | ORAL | Status: DC | PRN

## 2013-01-01 MED ORDER — POLYETHYLENE GLYCOL 3350 17 GM/SCOOP PO POWD: 17.0000 g | Freq: Two times a day (BID) | ORAL | Status: DC

## 2013-01-01 MED ORDER — ENSURE COMPLETE PO LIQD: 237.0000 mL | ORAL | Status: DC

## 2013-01-01 MED ORDER — DSS 100 MG PO CAPS: 100.0000 mg | ORAL_CAPSULE | Freq: Two times a day (BID) | ORAL | Status: DC

## 2013-01-01 NOTE — Progress Notes (Signed)
   Subjective: 3 Days Post-Op Procedure(s) (LRB): LEFT HIP HEMI ARTHROPLASTY  (Left)   Patient reports pain as mild, pain controlled. During the night she apparently had difficulty swallowing a larger pill, has had no difficulties this morning. No other events throughout the night. Ready to be discharged to SNF.   Objective:   VITALS:   Filed Vitals:   01/01/13  BP: 112/64  Pulse: 80  Temp: 97.9 F (36.6 C)   Resp: 16    Neurovascular intact Dorsiflexion/Plantar flexion intact Incision: dressing C/D/I No cellulitis present Compartment soft Bilateral edema significantly decreased since using the TED hoes    LABS  Recent Labs  12/30/12 0425 12/31/12 0345 01/01/13 0400  HGB 8.4* 7.8* 8.5*  HCT 26.6* 23.5* 26.8*  WBC 6.3 6.7 8.0  PLT 200 177 183     Recent Labs  12/29/12 1245 12/30/12 0425 12/31/12 0345  NA 138 136 140  K 3.5 3.5 3.2*  BUN 28* 22 24*  CREATININE 0.88 0.76 1.06  GLUCOSE 80 143* 123*     Assessment/Plan: 3 Days Post-Op Procedure(s) (LRB): LEFT HIP HEMI ARTHROPLASTY  (Left) Up with therapy Discharge to SNF Follow up in 2 weeks at [facility]. Follow up with OLIN,Anvay Tennis D in 2 weeks.  Contact information:  [facility] [address] [place] [address] [phone]    Expected ABLA  Treated with iron and will observe   Underweight (BMI <18.5)  Estimated body mass index is 17 kg/(m^2) as calculated from the following:      Height as of this encounter: 5' 5.5" (1.664 m).      Weight as of this encounter: 47.083 kg (103 lb 12.8 oz).  Patient also counseled that weight may inhibit the healing process  Patient counseled that losing weight will help with future health issues      Anastasio Auerbach. Tuck [place]   PAC  01/01/2013, 10:32 AM

## 2013-01-01 NOTE — Care Management Note (Signed)
    Page 1 of 1   01/01/2013     1:48:29 PM   CARE MANAGEMENT NOTE 01/01/2013  Patient:  Samantha Hudson, Samantha Hudson   Account Number:  [id number]  Date Initiated:  12/31/2012  Documentation initiated by:  Colleen Can  Subjective/Objective Assessment:   dx left hip femoral neck fracture; hemi-arthroplasty     Action/Plan:   SNF rehab   Anticipated DC Date:     Anticipated DC Plan:  SKILLED NURSING FACILITY  In-house referral  Clinical Social Worker      DC Planning Services  CM consult      Choice offered to / List presented to:             Status of service:  Completed, signed off Medicare Important Message given?  NA - LOS <3 / Initial given by admissions (If response is "NO", the following Medicare IM given date fields will be blank) Date Medicare IM given:   Date Additional Medicare IM given:    Discharge Disposition:  SKILLED NURSING FACILITY  Per UR Regulation:    If discussed at Long Length of Stay Meetings, dates discussed:    Comments:

## 2013-01-01 NOTE — Progress Notes (Signed)
Clinical Social Work Department CLINICAL SOCIAL WORK PLACEMENT NOTE 01/01/2013  Patient:  Samantha Hudson, Samantha Hudson  Account Number:  [id number] Admit date:  12/29/2012  Clinical Social Worker:  Cori Razor, LCSW  Date/time:  12/30/2012 01:27 PM  Clinical Social Work is seeking post-discharge placement for this patient at the following level of care:   SKILLED NURSING   (*CSW will update this form in Epic as items are completed)   12/30/2012  Patient/family provided with [facility] Department of Clinical Social Work's list of facilities offering this level of care within the geographic area requested by the patient (or if unable, by the patient's family).  12/30/2012  Patient/family informed of their freedom to choose among providers that offer the needed level of care, that participate in Medicare, Medicaid or managed care program needed by the patient, have an available bed and are willing to accept the patient.  12/30/2012  Patient/family informed of [facility]' ownership interest in [facility], as well as of the fact that they are under no obligation to receive care at this facility.  PASARR submitted to EDS on 12/30/2012 PASARR number received from EDS on 12/30/2012  FL2 transmitted to all facilities in geographic area requested by pt/family on  12/30/2012 FL2 transmitted to all facilities within larger geographic area on   Patient informed that his/her managed care company has contracts with or will negotiate with  certain facilities, including the following:     Patient/family informed of bed offers received:  12/31/2012 Patient chooses bed at [facility] Physician recommends and patient chooses bed at    Patient to be transferred to [facility]  on  01/01/2013 Patient to be transferred to facility by [facility]  The following physician request were entered in Epic:   Additional Comments:  Cori Razor LCSW [phone]

## 2013-01-01 NOTE — Discharge Summary (Signed)
Physician Discharge Summary  Patient ID: Samantha Hudson MRN: [id number] DOB/AGE: 26-Jul-1934 78 y.o.  Admit date: 12/29/2012 Discharge date:  01/01/2013  Procedures:  Procedure(s) (LRB): LEFT HIP HEMI ARTHROPLASTY  (Left)  Attending Physician:  Dr. Durene Romans   Admission Diagnoses:   Left femoral neck fracture   Discharge Diagnoses:  Principal Problem:   S/P left hip hemiarthroplasty  Past Medical History  Diagnosis Date  . Hyperlipemia   . IBS (irritable bowel syndrome)   . Osteoporosis   . Fibromyalgia   . Glaucoma     HAD LASER SURGERY - NO EYE DROPS REQUIRED  . Globus hystericus   . Fibromyalgia   . Meningioma   . CAD (coronary artery disease)     HEART STENT PLACED ABOUT 2000- NO LONGER SEES CARDIOLOGIST; NO C/O OF CHEST PAINS OR SOB  . Heart murmur     MVP SINCE THE 60'S - TAKES ATENOLOL -  . Anxiety   . Swelling     BILATERAL FEET AND LEGS - ONSET OF SWELLING APRIL 2014  . GERD (gastroesophageal reflux disease)   . Arthritis   . Fracture   . CVA (cerebral vascular accident)     STROKE 7 YRS AGO - DAY AFTER BRAIN SURGERY TO REMOVE BENIGN MENINGIOMA - HAS LEFT SIDED WEAKNESS AND A LITTLE NUMBNESS  . Pain     "EXCRUCIATING PAIN" LEFT HIP - HAS A FRACTURE - IS CONFINED TO W/C AT HOME - NO WEIGHT BEARING LEFT HIP.    HPI: Pt is a 78 y.o. female complaining of increasing significant left hip pain for 2 weeks. This year she has been struggling to heal from a wrist fracture since February, treated by Dr. Amanda Pea. In April she started to complain of pain in the sacral area and was diagnosed with a sacral stress fracture. She has started to feel better until about 2 weeks ago when she had significant left hip pain, causing her to not be able to walk on the left leg. Imaging studies eventually showed a left femoral neck fracture. Various options are discussed with the patient. Risks, benefits and expectations were discussed with the patient. Patient understand the risks,  benefits and expectations and wishes to proceed with surgery.   PCP: Lilyan Punt, MD   Discharged Condition: good  Hospital Course:  Patient underwent the above stated procedure on 12/29/2012. Patient tolerated the procedure well and brought to the recovery room in good condition and subsequently to the floor.  POD #1 BP: 113/57 ; Pulse: 81 ; Temp: 98.9 F (37.2 C) ; Resp: 20  Pt's hemovac drain removed. IV was changed to a saline lock. Patient reports pain as mild, pain well controlled. Difficulty controlling pain throughout the night, much better this morning.  Neurovascular intact, dorsiflexion/plantar flexion intact, incision: dressing C/D/I, no cellulitis present and compartment soft. Bilateral LE edema significantly resolved.  LABS  Basename    HGB  8.4  HCT  26.6   POD #2  BP: 94/53 ; Pulse: 83 ; Temp: 98.8 F (37.1 C) ; Resp: 18  Patient reports pain as mild, pain controlled. Little slow on the left side from previous stroke. States that she was able to get up with PT and walk out into the hallway. Had a long discussion regarding her having to build up her endurance and that she may be a little slower recovery because of the stroke symptoms. Neurovascular intact, dorsiflexion/plantar flexion intact, incision: dressing C/D/I, no cellulitis present and compartment soft. Bilateral LE  edema significantly resolved with the use of TED hoes.  LABS  Basename    HGB  7.8  HCT  23.5   POD #3  BP: 112/64 ; Pulse: 80 ; Temp: 97.9 F (36.6 C) ; Resp: 16 Patient reports pain as mild, pain controlled. During the night she apparently had difficulty swallowing a larger pill, has had no difficulties this morning. No other events throughout the night. Ready to be discharged to SNF. Neurovascular intact, dorsiflexion/plantar flexion intact, incision: dressing C/D/I, no cellulitis present and compartment soft.   LABS   No new labs   Discharge Exam: General appearance: alert, cooperative and  no distress Extremities: Homans sign is negative, no sign of DVT, no edema, redness or tenderness in the calves or thighs and no ulcers, gangrene or trophic changes  Disposition: SNF with follow up in 2 weeks   Follow-up Information   Follow up with Shelda Pal, MD. Schedule an appointment as soon as possible for a visit in 2 weeks.   Specialty:  Orthopedic Surgery   Contact information:   [address] [place] [address] [phone]       Discharge Orders   Future Orders Complete By Expires   Call MD / Call 911  As directed    Comments:     If you experience chest pain or shortness of breath, CALL 911 and be transported to the hospital emergency room.  If you develope a fever above 101 F, pus (white drainage) or increased drainage or redness at the wound, or calf pain, call your surgeon's office.   Constipation Prevention  As directed    Comments:     Drink plenty of fluids.  Prune juice may be helpful.  You may use a stool softener, such as Colace (over the counter) 100 mg twice a day.  Use MiraLax (over the counter) for constipation as needed.   Diet - low sodium heart healthy  As directed    Discharge instructions  As directed    Comments:     Maintain surgical dressing for 10-14 days, then replace with gauze and tape. Keep the area dry and clean until follow up. Follow up in 2 weeks at [facility]. Call with any questions or concerns.   Increase activity slowly as tolerated  As directed    Weight bearing as tolerated  As directed         Medication List    STOP taking these medications       traMADol 50 MG tablet  Commonly known as:  ULTRAM      TAKE these medications       atenolol 25 MG tablet  Commonly known as:  TENORMIN  Take 1 tablet (25 mg total) by mouth 2 (two) times daily.     atorvastatin 10 MG tablet  Commonly known as:  LIPITOR  Take 10 mg by mouth at bedtime.     dipyridamole-aspirin 200-25 MG per 12 hr  capsule  Commonly known as:  AGGRENOX  Take 1 capsule by mouth 2 (two) times daily.     DSS 100 MG Caps  Take 100 mg by mouth 2 (two) times daily.     DULoxetine 30 MG capsule  Commonly known as:  CYMBALTA  Take 30 mg by mouth every morning.     feeding supplement Liqd  Take 237 mL by mouth daily.     ferrous sulfate 325 (65 FE) MG tablet  Take 1 tablet (325 mg total) by  mouth 3 (three) times daily after meals.     gabapentin 400 MG capsule  Commonly known as:  NEURONTIN  Take 400 mg by mouth 3 (three) times daily.     HYDROcodone-acetaminophen 5-325 MG per tablet  Commonly known as:  NORCO/VICODIN  Take 1-2 tablets by mouth every 4 (four) hours as needed for pain.     LACTAID FAST ACT PO  Take 1 tablet by mouth daily as needed (stomach relief).     polyethylene glycol packet  Commonly known as:  MIRALAX / GLYCOLAX  Take 17 g by mouth daily as needed.     polyethylene glycol powder powder  Commonly known as:  GLYCOLAX/MIRALAX  Take 17 g by mouth 2 (two) times daily.     potassium chloride 10 MEQ tablet  Commonly known as:  K-DUR  Take 1 tablet (10 mEq total) by mouth 2 (two) times daily.     ranitidine 300 MG tablet  Commonly known as:  ZANTAC  Take 300 mg by mouth daily.     torsemide 20 MG tablet  Commonly known as:  DEMADEX  Take 20-40 mg by mouth See admin instructions. Two tabs in AM (40mg ) and one tab at noon (20mg )         Signed: Anastasio Auerbach. Kegan Mckeithan   PAC  01/01/2013, 10:40 AM

## 2013-01-01 NOTE — Progress Notes (Signed)
SNF bed available at [facility] today if pt is stable for d/c. CXR required for admission . CSW will assist with d/c planning for SNF.  Cori Razor LCSW [phone]

## 2013-01-09 ENCOUNTER — Telehealth: Payer: Self-pay | Admitting: Family Medicine

## 2013-01-09 NOTE — Telephone Encounter (Signed)
Had partial hip replacement on the 11th, everything went fine and she is at [facility] in [place] for couple more weeks. If you would like to talk to [place] call him on [phone]

## 2013-01-15 NOTE — Telephone Encounter (Signed)
Pt in rehab, I spoke with husband and pt, they will f/u here when released

## 2013-03-02 ENCOUNTER — Telehealth: Payer: Self-pay | Admitting: Family Medicine

## 2013-03-02 NOTE — Telephone Encounter (Signed)
Patients husband needs you to call him regarding patient and her visit to nursing home.

## 2013-03-03 ENCOUNTER — Other Ambulatory Visit: Payer: Self-pay

## 2013-03-03 MED ORDER — COLLAGENASE 250 UNIT/GM EX OINT: TOPICAL_OINTMENT | CUTANEOUS | Status: DC

## 2013-03-04 ENCOUNTER — Other Ambulatory Visit: Payer: Self-pay | Admitting: Family Medicine

## 2013-03-04 MED ORDER — HYDROCODONE-ACETAMINOPHEN 5-325 MG PO TABS: 1.0000 | ORAL_TABLET | ORAL | Status: DC | PRN

## 2013-03-04 NOTE — Telephone Encounter (Signed)
The patient overall is doing better she is having some increased swelling in the legs the husband is going to start using 2 of the Demadex tablets each morning. In addition to this hydrocodone #40 was written as needed for pain cautioned drowsiness the husband will pick up this prescription she has a followup appointment this coming Tuesday. We will review her medications when she comes.

## 2013-03-06 ENCOUNTER — Telehealth: Payer: Self-pay | Admitting: Family Medicine

## 2013-03-06 NOTE — Telephone Encounter (Signed)
She may have the referal for OT

## 2013-03-06 NOTE — Telephone Encounter (Signed)
Left message notifing home health that OT is ok.

## 2013-03-06 NOTE — Telephone Encounter (Signed)
Kat with [facility] called to get a verbal ok for OT for patient.  2 times a week for 4 weeks to help with self care abilities.  If "ok" you may leave the verbal order on her voicemail and she will send over any paperwork that may need to be signed.  Ph# [phone]

## 2013-03-10 ENCOUNTER — Ambulatory Visit (INDEPENDENT_AMBULATORY_CARE_PROVIDER_SITE_OTHER): Payer: Medicare Other | Admitting: Family Medicine

## 2013-03-10 ENCOUNTER — Encounter: Payer: Self-pay | Admitting: Family Medicine

## 2013-03-10 VITALS — BP 110/70 | Ht 65.5 in | Wt 103.0 lb

## 2013-03-10 DIAGNOSIS — R609 Edema, unspecified: Secondary | ICD-10-CM

## 2013-03-10 DIAGNOSIS — E441 Mild protein-calorie malnutrition: Secondary | ICD-10-CM

## 2013-03-10 DIAGNOSIS — M810 Age-related osteoporosis without current pathological fracture: Secondary | ICD-10-CM

## 2013-03-10 DIAGNOSIS — R600 Localized edema: Secondary | ICD-10-CM

## 2013-03-10 DIAGNOSIS — D649 Anemia, unspecified: Secondary | ICD-10-CM

## 2013-03-10 DIAGNOSIS — Z79899 Other long term (current) drug therapy: Secondary | ICD-10-CM

## 2013-03-10 LAB — CBC WITH DIFFERENTIAL/PLATELET
Basophils Absolute: 0 10*3/uL (ref 0.0–0.1)
Basophils Relative: 0 % (ref 0–1)
Eosinophils Relative: 3 % (ref 0–5)
HCT: 33.4 % — ABNORMAL LOW (ref 36.0–46.0)
Hemoglobin: 11 g/dL — ABNORMAL LOW (ref 12.0–15.0)
Lymphocytes Relative: 31 % (ref 12–46)
MCHC: 32.9 g/dL (ref 30.0–36.0)
MCV: 91.3 fL (ref 78.0–100.0)
Monocytes Absolute: 0.5 10*3/uL (ref 0.1–1.0)
Monocytes Relative: 10 % (ref 3–12)
RDW: 13.5 % (ref 11.5–15.5)

## 2013-03-10 NOTE — Progress Notes (Addendum)
  Subjective:    Patient ID: Samantha Hudson, female    DOB: May 29, 1934, 78 y.o.   MRN: [id number]  HPIHere for a follow up.  Patient had fractures of the pelvis as well as the hip had to go through surgery then went through physical therapy patient has had an extended stay in the hospital and also an extended stay in rehabilitation. It's been very difficult on her. Check bed sore on bottom and left heel.  Family history noncontributory Social doesn't smoke Past medical history fairly extensive see problem list and previous She has good family support with her husband. She denies needing anything currently. Review of Systems She denies chest tightness she denies shortness of breath vomiting diarrhea she does relate pedal edema.    Objective:   Physical Exam Eardrums normal throat is normal neck no masses felt lungs are clear no crackles heart is regular pulses normal abdomen is soft extremities 1-2+ edema bilateral the left heel shows evidence of a healing sore as well as the sacrum has a healing bed sore. Patient is then slightly cachectic as well as week. She does walk with a walker.      Assessment & Plan:  #1 pedal edema-continue diuretic in the morning. And potassium. Check metabolic 7. #2 pressure ulcer on the sacrum as well as left heel-both appear to be healing well. They are currently doing home health #3 osteoporosis will check into the center if we can get her approved for Reclast or Boniva. She did not tolerate Fosamax or early because it caused severe stomach discomfort #4 malnutrition improve protein intake we discussed various measures also check lab work #5 recommend patient followup in one month's time to recheck weight  It should be noted that patient was seen face-to-face. She needs physical therapy because of ataxia weakness recent hip fracture and sacral ulcer. She would benefit from a home health nurse evaluating her condition as well as physical therapy. She is  homebound because of her severe weakness and she cannot drive currently.

## 2013-03-11 LAB — HEPATIC FUNCTION PANEL
ALT: 15 U/L (ref 0–35)
AST: 20 U/L (ref 0–37)
Bilirubin, Direct: 0.1 mg/dL (ref 0.0–0.3)
Indirect Bilirubin: 0.2 mg/dL (ref 0.0–0.9)
Total Protein: 6.8 g/dL (ref 6.0–8.3)

## 2013-03-11 LAB — PREALBUMIN: Prealbumin: 19.2 mg/dL (ref 17.0–34.0)

## 2013-03-11 LAB — BASIC METABOLIC PANEL
BUN: 28 mg/dL — ABNORMAL HIGH (ref 6–23)
Calcium: 9.5 mg/dL (ref 8.4–10.5)
Creat: 1.01 mg/dL (ref 0.50–1.10)
Glucose, Bld: 106 mg/dL — ABNORMAL HIGH (ref 70–99)
Potassium: 4.1 mEq/L (ref 3.5–5.3)

## 2013-03-11 LAB — FERRITIN: Ferritin: 104 ng/mL (ref 10–291)

## 2013-03-12 ENCOUNTER — Encounter: Payer: Self-pay | Admitting: Family Medicine

## 2013-03-16 ENCOUNTER — Telehealth: Payer: Self-pay | Admitting: Family Medicine

## 2013-03-16 NOTE — Telephone Encounter (Signed)
Patient going for her Reclast infusion tomm in day surgery. Discussed with patient.

## 2013-03-16 NOTE — Telephone Encounter (Signed)
Patient would like for a Nurse to call regarding her Bone Destiny test.  Also has other things to discuss.  Would like a nurse to call her today

## 2013-03-17 ENCOUNTER — Encounter (HOSPITAL_COMMUNITY)
Admission: RE | Admit: 2013-03-17 | Discharge: 2013-03-17 | Disposition: A | Discharge status: Home / Self Care | Payer: Medicare Other | Source: Ambulatory Visit | Attending: Family Medicine | Admitting: Family Medicine

## 2013-03-17 DIAGNOSIS — M810 Age-related osteoporosis without current pathological fracture: Secondary | ICD-10-CM | POA: Insufficient documentation

## 2013-03-17 DIAGNOSIS — D649 Anemia, unspecified: Secondary | ICD-10-CM | POA: Insufficient documentation

## 2013-03-17 MED ORDER — SODIUM CHLORIDE 0.9 % IV SOLN
INTRAVENOUS | Status: DC
  Administered 2013-03-17: 11:00:00 via INTRAVENOUS

## 2013-03-17 MED ORDER — ZOLEDRONIC ACID 5 MG/100ML IV SOLN
5.0000 mg | Freq: Once | INTRAVENOUS | Status: AC
  Administered 2013-03-17: 5 mg via INTRAVENOUS
  Filled 2013-03-17: qty 100

## 2013-03-26 ENCOUNTER — Other Ambulatory Visit: Payer: Self-pay | Admitting: *Deleted

## 2013-03-26 MED ORDER — TRAMADOL HCL 50 MG PO TABS: 50.0000 mg | ORAL_TABLET | Freq: Four times a day (QID) | ORAL | Status: DC | PRN

## 2013-04-07 ENCOUNTER — Encounter: Payer: Self-pay | Admitting: Family Medicine

## 2013-04-07 ENCOUNTER — Ambulatory Visit (INDEPENDENT_AMBULATORY_CARE_PROVIDER_SITE_OTHER): Payer: Medicare Other | Admitting: Family Medicine

## 2013-04-07 VITALS — BP 110/68 | Ht 65.5 in | Wt 101.0 lb

## 2013-04-07 DIAGNOSIS — M8000XG Age-related osteoporosis with current pathological fracture, unspecified site, subsequent encounter for fracture with delayed healing: Secondary | ICD-10-CM

## 2013-04-07 DIAGNOSIS — R600 Localized edema: Secondary | ICD-10-CM

## 2013-04-07 DIAGNOSIS — R5381 Other malaise: Secondary | ICD-10-CM

## 2013-04-07 DIAGNOSIS — M8448XD Pathological fracture, other site, subsequent encounter for fracture with routine healing: Secondary | ICD-10-CM

## 2013-04-07 DIAGNOSIS — M810 Age-related osteoporosis without current pathological fracture: Secondary | ICD-10-CM

## 2013-04-07 DIAGNOSIS — R262 Difficulty in walking, not elsewhere classified: Secondary | ICD-10-CM | POA: Insufficient documentation

## 2013-04-07 DIAGNOSIS — R531 Weakness: Secondary | ICD-10-CM

## 2013-04-07 DIAGNOSIS — R609 Edema, unspecified: Secondary | ICD-10-CM

## 2013-04-07 NOTE — Progress Notes (Addendum)
  Subjective:    Patient ID: Samantha Hudson, female    DOB: 1934-10-01, 78 y.o.   MRN: [id number]  HPIFollow up on leg and feet swelling. Now taking one fluid pill. Wants to discuss cutting down fluid pill. Current diuretic causes a fair amount of excessive urination. She would like to try less. She is down to one tablet each morning.  Depression. Husband states she cries intermittently and gets upset she is using Cymbalta. Not suicidal.\  She is trying to do some walking on a regular basis although she is still very weak hard to get around. Doing physical therapy but recently stopped.  Has osteoporosis tolerated the ID medication review this again in one years time  Past medical history osteoporosis, cachexia, peripheral edema, peripheral neuropathy, prior stroke Family history noncontributory     Review of Systems Denies headaches fever chills cough shortness of breath nausea vomiting chest pain abdominal pain    Objective:   Physical Exam Very thin vital signs noted Lungs are clear no crackles Heart regular Abdomen soft extremities trace edema in the ankles and feet Neurologic grossly normal some short-term memory issues    Animals in 60 seconds- 15 immedeate recall 3/3, clock good, 1/3 recall after clock    Assessment & Plan:  Memory- follow up in 2 months, I am concerned about the possibility of an onset of dementia yeah. I doubt that the statin drugs are causing this. Nutrition-patient is trying hard to do better. Weight is stable. We will will followup if ongoing troubles but as long as she keeps her weight run 100 pounds I am pleased Pedal edema- Demadex 1/2 qam-husband will try a lower dose of diuretic to see if her pedal edema stasis same she might be up to get away with less medicine, if swelling goes back up he will increase the medication to a full tablet Neuropathy- stable, continue Neurontin followup if ongoing troubles Increase wallkng for strength. Patient at  risk for severe falls she needs to strengthen herself by as much walking as possible we will put in a request to home health physical therapy for further physical therapy patient is homebound because of the severe weakness.  It should be noted that the patient was seen face to face.

## 2013-04-09 ENCOUNTER — Telehealth: Payer: Self-pay | Admitting: Family Medicine

## 2013-04-09 NOTE — Telephone Encounter (Signed)
You filled out face to face form on patient but missing date of this encounter you filled out at bottom. So just need date where star is .Thanks

## 2013-04-09 NOTE — Telephone Encounter (Signed)
Done

## 2013-05-06 DIAGNOSIS — E119 Type 2 diabetes mellitus without complications: Secondary | ICD-10-CM

## 2013-05-06 DIAGNOSIS — Z471 Aftercare following joint replacement surgery: Secondary | ICD-10-CM

## 2013-05-06 DIAGNOSIS — M159 Polyosteoarthritis, unspecified: Secondary | ICD-10-CM

## 2013-06-09 ENCOUNTER — Ambulatory Visit (INDEPENDENT_AMBULATORY_CARE_PROVIDER_SITE_OTHER): Payer: Medicare Other | Admitting: Family Medicine

## 2013-06-09 ENCOUNTER — Encounter: Payer: Self-pay | Admitting: Family Medicine

## 2013-06-09 ENCOUNTER — Ambulatory Visit: Payer: Medicare Other | Admitting: Family Medicine

## 2013-06-09 VITALS — BP 96/60 | Ht 65.5 in | Wt 101.2 lb

## 2013-06-09 DIAGNOSIS — K222 Esophageal obstruction: Secondary | ICD-10-CM

## 2013-06-09 DIAGNOSIS — R609 Edema, unspecified: Secondary | ICD-10-CM

## 2013-06-09 DIAGNOSIS — R600 Localized edema: Secondary | ICD-10-CM

## 2013-06-09 DIAGNOSIS — R262 Difficulty in walking, not elsewhere classified: Secondary | ICD-10-CM

## 2013-06-09 DIAGNOSIS — Z96649 Presence of unspecified artificial hip joint: Secondary | ICD-10-CM

## 2013-06-09 NOTE — Progress Notes (Signed)
   Subjective:    Patient ID: Samantha Hudson, female    DOB: 1934/10/27, 79 y.o.   MRN: [id number]  HPI Patient is here today for follow up visit for memory issues. Patient has no concerns at this time. States she is doing very well.   L Hip Pain-seems to be better than what it was. Gradually getting stronger. Physical therapy.  Eating well try to hold her weight.  Podiatry- toe nail, family cannot cut them they would like to see podiatry  We talked at length patient her husband and myself about his memory. At times she does repeat herself but she feels for the most part she is doing fairly good her husband agrees. She does still do very as puzzles and other things and is fairly self-sufficient she text  her grandchildren  Review of Systems She denies chest tightness pressure pain shortness breath denies nausea vomiting diarrhea.    Objective:   Physical Exam  Lungs are clear hearts regular pulse normal extremities no edema skin warm dry she is able to walk with a walker      Assessment & Plan:  [place]-family will call and set up an appointment to have her feet checked by podiatry  L Hip pain-actually much better than what it was she uses a walker to get around but she still concerned about why she is having hip pain she was informed as good last ongoing  Memeory-her Mini-Mental Status exam is actually very good at 20 28/30 she passes in many cognitive, I don't feel that the patient has cognitive dysfunction currently we will monitor this  GERD Mylanta she is also using Zantac on a daily basis. Mylanta on a when necessary basis trying to watch diet. Followup in 4 months

## 2013-06-12 ENCOUNTER — Other Ambulatory Visit: Payer: Self-pay | Admitting: Family Medicine

## 2013-06-23 ENCOUNTER — Telehealth: Payer: Self-pay | Admitting: Family Medicine

## 2013-06-23 NOTE — Telephone Encounter (Signed)
Patient says she forgot to tell you at her last visit about her fingers turning purple on her left hand most of the time, right hand occasionally. Please advise.

## 2013-06-24 NOTE — Telephone Encounter (Signed)
Could be circulation, could be venous insufficiency, if pain associated with this she NTBS, if progressive or persistant follow up in near future would be fine. Keep hands warm also

## 2013-06-24 NOTE — Telephone Encounter (Signed)
Patient notified and verbalized understanding. 

## 2013-07-09 ENCOUNTER — Other Ambulatory Visit: Payer: Self-pay | Admitting: Family Medicine

## 2013-07-09 DIAGNOSIS — Z1231 Encounter for screening mammogram for malignant neoplasm of breast: Secondary | ICD-10-CM

## 2013-07-23 ENCOUNTER — Ambulatory Visit (HOSPITAL_COMMUNITY): Payer: Medicare Other

## 2013-07-23 ENCOUNTER — Ambulatory Visit (HOSPITAL_COMMUNITY)
Admission: RE | Admit: 2013-07-23 | Discharge: 2013-07-23 | Disposition: A | Discharge status: Home / Self Care | Payer: Medicare Other | Source: Ambulatory Visit | Attending: Family Medicine | Admitting: Family Medicine

## 2013-07-23 DIAGNOSIS — Z1231 Encounter for screening mammogram for malignant neoplasm of breast: Secondary | ICD-10-CM | POA: Insufficient documentation

## 2013-07-28 ENCOUNTER — Other Ambulatory Visit: Payer: Self-pay | Admitting: Family Medicine

## 2013-07-29 ENCOUNTER — Other Ambulatory Visit: Payer: Self-pay | Admitting: *Deleted

## 2013-07-29 MED ORDER — TRAMADOL HCL 50 MG PO TABS: 50.0000 mg | ORAL_TABLET | Freq: Four times a day (QID) | ORAL | Status: DC | PRN

## 2013-08-07 ENCOUNTER — Other Ambulatory Visit: Payer: Self-pay | Admitting: Family Medicine

## 2013-08-17 ENCOUNTER — Telehealth: Payer: Self-pay | Admitting: Family Medicine

## 2013-08-17 MED ORDER — OXYBUTYNIN CHLORIDE ER 10 MG PO TB24: 10.0000 mg | ORAL_TABLET | Freq: Every day | ORAL | Status: DC

## 2013-08-17 NOTE — Telephone Encounter (Signed)
Nurse to research this in chart n let me know

## 2013-08-17 NOTE — Telephone Encounter (Signed)
Ok plus three ref 

## 2013-08-17 NOTE — Telephone Encounter (Signed)
Med sent to pharm. Left message notifying patient.

## 2013-08-17 NOTE — Telephone Encounter (Signed)
pts husband calling stating she is not using the gelnique for her urinary issues  Wants to go back to the tabs that you were giving her a few years ago  Oxybutin?   [facility]

## 2013-08-17 NOTE — Telephone Encounter (Signed)
Oxybutynin cl er 10 mg tabs

## 2013-08-26 ENCOUNTER — Other Ambulatory Visit: Payer: Self-pay | Admitting: Family Medicine

## 2013-09-11 ENCOUNTER — Ambulatory Visit: Payer: Medicare Other | Admitting: Family Medicine

## 2013-09-15 ENCOUNTER — Ambulatory Visit (INDEPENDENT_AMBULATORY_CARE_PROVIDER_SITE_OTHER): Payer: Medicare Other | Admitting: Family Medicine

## 2013-09-15 ENCOUNTER — Encounter: Payer: Self-pay | Admitting: Family Medicine

## 2013-09-15 VITALS — BP 112/66 | Ht 65.5 in | Wt 95.4 lb

## 2013-09-15 DIAGNOSIS — M79652 Pain in left thigh: Secondary | ICD-10-CM

## 2013-09-15 DIAGNOSIS — M8000XG Age-related osteoporosis with current pathological fracture, unspecified site, subsequent encounter for fracture with delayed healing: Secondary | ICD-10-CM

## 2013-09-15 DIAGNOSIS — M8448XD Pathological fracture, other site, subsequent encounter for fracture with routine healing: Secondary | ICD-10-CM

## 2013-09-15 DIAGNOSIS — N3281 Overactive bladder: Secondary | ICD-10-CM

## 2013-09-15 DIAGNOSIS — N318 Other neuromuscular dysfunction of bladder: Secondary | ICD-10-CM

## 2013-09-15 DIAGNOSIS — E782 Mixed hyperlipidemia: Secondary | ICD-10-CM

## 2013-09-15 DIAGNOSIS — R5383 Other fatigue: Secondary | ICD-10-CM

## 2013-09-15 DIAGNOSIS — M810 Age-related osteoporosis without current pathological fracture: Secondary | ICD-10-CM

## 2013-09-15 DIAGNOSIS — M79609 Pain in unspecified limb: Secondary | ICD-10-CM

## 2013-09-15 DIAGNOSIS — E441 Mild protein-calorie malnutrition: Secondary | ICD-10-CM

## 2013-09-15 DIAGNOSIS — R5381 Other malaise: Secondary | ICD-10-CM

## 2013-09-15 DIAGNOSIS — D649 Anemia, unspecified: Secondary | ICD-10-CM

## 2013-09-15 DIAGNOSIS — Z79899 Other long term (current) drug therapy: Secondary | ICD-10-CM

## 2013-09-15 NOTE — Progress Notes (Signed)
Subjective:    Patient ID: Samantha Hudson, female    DOB: [birth year], 79 y.o.   MRN: [id number]  HPIMed check up. We went overall of her medications.  Hoarsness. Started within the past year. She initially said hoarseness but the real problem is she doesn't get a lot of volume with her speaking. She denies any coughing or bringing up any blood.  Oxybutinin causing dry mouth. The oral medications, severe dry mouth which is a major hindrance in quality of life issue when she uses the oxybutynin topical she did not have that problem we will try to get this approved.  Gabapentin and tramadol causing drowsiness. So therefore she is no longer taking that medication.  Fingers turning blue on left hand.  Started over 1 year ago. The fingers tend to turn blue more so when they get cool she denies any arm pain. She has global weakness due to gradual loss of weight.  Left thigh and left hip pain. Doing exercises with weights. She has osteoporosis she takes medication for that she has developed left hip left thigh pain that she thought was related to the stroke for many years ago.  She denies being depressed currently  Review of Systems She denies chest pain she does relate some shortness of breath with activity denies abdominal pain relates left thigh pain she does describe dry mouth when she took the medication no longer having dry mouth. She relates appetite not as good as it used to be. She states she tries to eat plenty. Her husband states she doesn't eat much. Denies fevers cough vomiting diarrhea    Objective:   Physical Exam Neck no masses lungs are clear no crackles heart is regular pulses normal she does have some slight cyanosis of the left hand it's more so around he appearance of the fingers pulses in the radial pulses both arms is good extremities she does have knee-high surgical hose on no edema is noted. Subjective discomfort in the mid left thigh.       Assessment & Plan:  1.  Osteoporosis with pathological fracture with delayed healing This patient has osteoporosis she is having left femur pain she is on IV medication Reclast once yearly I am concerned about the possibility of developing a mid shaft fracture I would recommend that the patient go ahead and get x-ray of this area. Rationale discussed.  2. Anemia Patient does have anemia history. She's been losing weight. We need to recheck a CBC. - CBC with Differential  3. Mild malnutrition Patient has mild malnutrition she does not eat much she had EGD and colonoscopy back in 2013 I don't feel this needs to be repeated I think the best thing is for her to continue onward with pushing herself to eat more and eat richer foods to pick up more calories  4. Left thigh pain She has left thigh pain x-ray as discussed above it is in her benefit to continue her osteoporosis medicine - DG Femur Left  5. Overactive bladder She has overactive bladder did not tolerate oxybutynin by mouth I would recommend trying the gel. In the past she is using gentleman did good for her and did not have dryness of the mouth with it. It is hard to argue with success.  6. Other malaise and fatigue This patient is been losing weight having fatigue and tiredness we will check thyroid function - TSH  7. Encounter for long-term (current) use of other medications Patient is on some long-term  medicines that could affect liver and kidney function we need to check this - Hepatic function panel - Basic metabolic panel  8. Mixed hyperlipidemia History hyperlipidemia check lipid profile  Followup 6 weeks - Lipid panel

## 2013-09-17 ENCOUNTER — Ambulatory Visit (HOSPITAL_COMMUNITY)
Admission: RE | Admit: 2013-09-17 | Discharge: 2013-09-17 | Disposition: A | Discharge status: Home / Self Care | Payer: Medicare Other | Source: Ambulatory Visit | Attending: Family Medicine | Admitting: Family Medicine

## 2013-09-17 DIAGNOSIS — R937 Abnormal findings on diagnostic imaging of other parts of musculoskeletal system: Secondary | ICD-10-CM | POA: Insufficient documentation

## 2013-09-17 DIAGNOSIS — M79609 Pain in unspecified limb: Secondary | ICD-10-CM | POA: Insufficient documentation

## 2013-09-17 LAB — CBC WITH DIFFERENTIAL/PLATELET
BASOS ABS: 0 10*3/uL (ref 0.0–0.1)
BASOS PCT: 0 % (ref 0–1)
Eosinophils Absolute: 0.1 10*3/uL (ref 0.0–0.7)
Eosinophils Relative: 3 % (ref 0–5)
HCT: 35.5 % — ABNORMAL LOW (ref 36.0–46.0)
HEMOGLOBIN: 11.4 g/dL — AB (ref 12.0–15.0)
LYMPHS PCT: 38 % (ref 12–46)
Lymphs Abs: 1.6 10*3/uL (ref 0.7–4.0)
MCH: 28.9 pg (ref 26.0–34.0)
MCHC: 32.1 g/dL (ref 30.0–36.0)
MCV: 89.9 fL (ref 78.0–100.0)
MONO ABS: 0.4 10*3/uL (ref 0.1–1.0)
Monocytes Relative: 10 % (ref 3–12)
NEUTROS ABS: 2.1 10*3/uL (ref 1.7–7.7)
NEUTROS PCT: 49 % (ref 43–77)
Platelets: 202 10*3/uL (ref 150–400)
RBC: 3.95 MIL/uL (ref 3.87–5.11)
RDW: 13.4 % (ref 11.5–15.5)
WBC: 4.2 10*3/uL (ref 4.0–10.5)

## 2013-09-17 LAB — HEPATIC FUNCTION PANEL
ALBUMIN: 4.1 g/dL (ref 3.5–5.2)
ALT: 27 U/L (ref 0–35)
AST: 30 U/L (ref 0–37)
Alkaline Phosphatase: 46 U/L (ref 39–117)
BILIRUBIN INDIRECT: 0.4 mg/dL (ref 0.2–1.2)
Bilirubin, Direct: 0.1 mg/dL (ref 0.0–0.3)
TOTAL PROTEIN: 6.9 g/dL (ref 6.0–8.3)
Total Bilirubin: 0.5 mg/dL (ref 0.2–1.2)

## 2013-09-17 LAB — TSH: TSH: 5.393 u[IU]/mL — AB (ref 0.350–4.500)

## 2013-09-17 LAB — LIPID PANEL
CHOLESTEROL: 145 mg/dL (ref 0–200)
HDL: 63 mg/dL (ref >39)
LDL CALC: 70 mg/dL (ref 0–99)
TRIGLYCERIDES: 59 mg/dL (ref <150)
Total CHOL/HDL Ratio: 2.3 Ratio
VLDL: 12 mg/dL (ref 0–40)

## 2013-09-17 LAB — BASIC METABOLIC PANEL
BUN: 25 mg/dL — AB (ref 6–23)
CHLORIDE: 100 meq/L (ref 96–112)
CO2: 35 mEq/L — ABNORMAL HIGH (ref 19–32)
CREATININE: 0.89 mg/dL (ref 0.50–1.10)
Calcium: 9.5 mg/dL (ref 8.4–10.5)
Glucose, Bld: 81 mg/dL (ref 70–99)
Potassium: 4.2 mEq/L (ref 3.5–5.3)
Sodium: 139 mEq/L (ref 135–145)

## 2013-09-18 NOTE — Progress Notes (Signed)
Patient notified and verbalized understanding of the test results. No further questions. 

## 2013-09-28 ENCOUNTER — Other Ambulatory Visit: Payer: Self-pay | Admitting: Family Medicine

## 2013-10-27 ENCOUNTER — Encounter: Payer: Self-pay | Admitting: Family Medicine

## 2013-10-27 ENCOUNTER — Other Ambulatory Visit: Payer: Self-pay | Admitting: Family Medicine

## 2013-10-27 ENCOUNTER — Ambulatory Visit (INDEPENDENT_AMBULATORY_CARE_PROVIDER_SITE_OTHER): Payer: Medicare Other | Admitting: Family Medicine

## 2013-10-27 VITALS — BP 112/68 | Wt 97.6 lb

## 2013-10-27 DIAGNOSIS — Z23 Encounter for immunization: Secondary | ICD-10-CM

## 2013-10-27 DIAGNOSIS — R230 Cyanosis: Secondary | ICD-10-CM

## 2013-10-27 DIAGNOSIS — R5381 Other malaise: Secondary | ICD-10-CM

## 2013-10-27 DIAGNOSIS — R5383 Other fatigue: Secondary | ICD-10-CM

## 2013-10-27 MED ORDER — GABAPENTIN 400 MG PO CAPS: 400.0000 mg | ORAL_CAPSULE | Freq: Two times a day (BID) | ORAL | Status: DC

## 2013-10-27 MED ORDER — TRAMADOL HCL 50 MG PO TABS: 50.0000 mg | ORAL_TABLET | Freq: Three times a day (TID) | ORAL | Status: DC | PRN

## 2013-10-27 MED ORDER — GABAPENTIN 100 MG PO CAPS: ORAL_CAPSULE | ORAL | Status: DC

## 2013-10-27 NOTE — Progress Notes (Signed)
   Subjective:    Patient ID: Samantha Hudson, female    DOB: [birth year], 79 y.o.   MRN: [id number]  HPI6 week follow up. Had bloodwork on 09/15/13. This was reviewed with the patient today.  Had fall since last visit. Fell at [employer] at [facility] [employer]. Went to Hospital at [facility]. Had xrays. She relates a lot of soreness pain discomfort x-rays were negative.  Wants to discuss exercises to do for her back.   Fingers purple for the past several months. This comes and goes sometimes worse than others  Left foot discolored. The ankles stay healthy with the state. The toes are purplish.  Discuss lowering dose on gabapentin and tramadol. Feel tired after taking and hard to wake up. Falls asleep multiple times throughout the day    Review of Systems She denies high fever she does relate some shortness of breath with activity denies chest pain denies PND does get some swelling in the ankles husband relates fatigue and tiredness during the day    Objective:   Physical Exam On exam her lungs are clear hearts regular pulse normal pulses in the feet are diminished difficult to find in addition to this she has coolness of the toes and slight to moderate cyanosis of the toes her fingers are not purple today. She is sitting in a chair she has a difficult time walking       Assessment & Plan:  Recent fall-mild contusions gradually getting better with this  Intermittent cyanosis of the hands her pulses are good I feel this is microvascular disease along with her being thin  Mild cyanosis in the feet with coolness of the toes and diminished pulses we will do some arterial Doppler studies could be venous stasis changes as well.  Fatigue tiredness from medication gradually reduce gabapentin 400 mg twice daily 100 mg 2 in the afternoon. In addition to this reduced tramadol to 1 3 times a day when necessary pain  25 minutes spent with patient. Greater than half in discussion of all these issues

## 2013-10-29 ENCOUNTER — Ambulatory Visit (HOSPITAL_COMMUNITY)
Admission: RE | Admit: 2013-10-29 | Discharge: 2013-10-29 | Disposition: A | Discharge status: Home / Self Care | Payer: Medicare Other | Source: Ambulatory Visit | Attending: Family Medicine | Admitting: Family Medicine

## 2013-10-29 DIAGNOSIS — R230 Cyanosis: Secondary | ICD-10-CM | POA: Insufficient documentation

## 2013-12-02 ENCOUNTER — Other Ambulatory Visit: Payer: Self-pay | Admitting: Family Medicine

## 2013-12-29 ENCOUNTER — Encounter: Payer: Self-pay | Admitting: Family Medicine

## 2013-12-29 ENCOUNTER — Ambulatory Visit (INDEPENDENT_AMBULATORY_CARE_PROVIDER_SITE_OTHER): Payer: Medicare Other | Admitting: Family Medicine

## 2013-12-29 ENCOUNTER — Ambulatory Visit (HOSPITAL_COMMUNITY)
Admission: RE | Admit: 2013-12-29 | Discharge: 2013-12-29 | Disposition: A | Discharge status: Home / Self Care | Payer: Medicare Other | Source: Ambulatory Visit | Attending: Family Medicine | Admitting: Family Medicine

## 2013-12-29 VITALS — BP 110/78 | Ht 65.5 in | Wt 95.4 lb

## 2013-12-29 DIAGNOSIS — M25559 Pain in unspecified hip: Secondary | ICD-10-CM | POA: Diagnosis present

## 2013-12-29 DIAGNOSIS — M1610 Unilateral primary osteoarthritis, unspecified hip: Secondary | ICD-10-CM | POA: Insufficient documentation

## 2013-12-29 DIAGNOSIS — M898X5 Other specified disorders of bone, thigh: Secondary | ICD-10-CM

## 2013-12-29 DIAGNOSIS — Z96649 Presence of unspecified artificial hip joint: Secondary | ICD-10-CM | POA: Insufficient documentation

## 2013-12-29 DIAGNOSIS — M25552 Pain in left hip: Secondary | ICD-10-CM

## 2013-12-29 DIAGNOSIS — D638 Anemia in other chronic diseases classified elsewhere: Secondary | ICD-10-CM

## 2013-12-29 DIAGNOSIS — E039 Hypothyroidism, unspecified: Secondary | ICD-10-CM

## 2013-12-29 DIAGNOSIS — IMO0002 Reserved for concepts with insufficient information to code with codable children: Secondary | ICD-10-CM | POA: Insufficient documentation

## 2013-12-29 DIAGNOSIS — M1710 Unilateral primary osteoarthritis, unspecified knee: Secondary | ICD-10-CM | POA: Insufficient documentation

## 2013-12-29 DIAGNOSIS — M949 Disorder of cartilage, unspecified: Secondary | ICD-10-CM

## 2013-12-29 DIAGNOSIS — M169 Osteoarthritis of hip, unspecified: Secondary | ICD-10-CM | POA: Diagnosis not present

## 2013-12-29 DIAGNOSIS — Z23 Encounter for immunization: Secondary | ICD-10-CM

## 2013-12-29 DIAGNOSIS — E038 Other specified hypothyroidism: Secondary | ICD-10-CM

## 2013-12-29 DIAGNOSIS — M899 Disorder of bone, unspecified: Secondary | ICD-10-CM

## 2013-12-29 LAB — CBC WITH DIFFERENTIAL/PLATELET
BASOS ABS: 0 10*3/uL (ref 0.0–0.1)
BASOS PCT: 0 % (ref 0–1)
Eosinophils Absolute: 0.1 10*3/uL (ref 0.0–0.7)
Eosinophils Relative: 2 % (ref 0–5)
HEMATOCRIT: 34.8 % — AB (ref 36.0–46.0)
Hemoglobin: 11.4 g/dL — ABNORMAL LOW (ref 12.0–15.0)
Lymphocytes Relative: 37 % (ref 12–46)
Lymphs Abs: 1.9 10*3/uL (ref 0.7–4.0)
MCH: 29.2 pg (ref 26.0–34.0)
MCHC: 32.8 g/dL (ref 30.0–36.0)
MCV: 89.2 fL (ref 78.0–100.0)
MONO ABS: 0.5 10*3/uL (ref 0.1–1.0)
Monocytes Relative: 9 % (ref 3–12)
NEUTROS ABS: 2.6 10*3/uL (ref 1.7–7.7)
Neutrophils Relative %: 52 % (ref 43–77)
Platelets: 192 10*3/uL (ref 150–400)
RBC: 3.9 MIL/uL (ref 3.87–5.11)
RDW: 13.5 % (ref 11.5–15.5)
WBC: 5 10*3/uL (ref 4.0–10.5)

## 2013-12-29 NOTE — Progress Notes (Signed)
   Subjective:    Patient ID: Samantha Hudson, female    DOB: 07-Mar-1935, 79 y.o.   MRN: [id number]  HPI Patient arrives for a follow up on pedal edema. Patient states she has been having problems with pain in left leg for few days.  She's had a previous hip surgery. She's had previous stress fractures. She has pain discomfort in the left hip radiates down the left leg. She denies any excessive swelling in her legs. She states her appetite is fair she tries to eat well. She has been unable to exercise much recently since her leg it started hurting her. She denies any chest tightness pressure pain denies any rectal bleeding vomiting diarrhea denies rashes. Denies any change in medications. Review of Systems    please see above. No symptoms of unilateral neurologic issues no sign of stroke Objective:   Physical Exam She is somewhat cachectic. She is thin she is alert she is able to walk although it is a very unsteady walk I told her she needs to use her walker. In addition to this I recommend that the patient try to do some walking for strengthening exercises her lungs are clear hearts regular pulse normal her weight 95 which is within a few pounds at her baseline. She states she is trying eat better.       Assessment & Plan:  Left hip pain-patient will need to go ahead and x-ray the hip we await the results of this probably will need referral back to her orthopedist who did the surgery ( Dr Gramig/Dr Alvan Dame) may use tramadol when necessary  Left femur pain-I doubt atypical fracture related to biphosphonate's but I do believe it is wise for him to do x-ray of her femur  Subclinical hypothyroid-TSH was slightly elevated on a previous test we will check a level may need to start on some medication await the results.  anemia-mild anemia nothing serious but patient does need a recheck of the CBC.

## 2013-12-30 LAB — TSH: TSH: 3.316 u[IU]/mL (ref 0.350–4.500)

## 2013-12-31 ENCOUNTER — Other Ambulatory Visit: Payer: Self-pay | Admitting: *Deleted

## 2013-12-31 ENCOUNTER — Telehealth: Payer: Self-pay | Admitting: *Deleted

## 2013-12-31 DIAGNOSIS — G8929 Other chronic pain: Secondary | ICD-10-CM

## 2013-12-31 DIAGNOSIS — M25552 Pain in left hip: Principal | ICD-10-CM

## 2013-12-31 NOTE — Telephone Encounter (Signed)
Pt's husband notified.

## 2013-12-31 NOTE — Telephone Encounter (Signed)
[facility] Pt appt with Dr. Alvan Dame is Wednesday Sept 9th at 3:45 pm.

## 2014-01-01 ENCOUNTER — Other Ambulatory Visit: Payer: Self-pay | Admitting: *Deleted

## 2014-01-01 DIAGNOSIS — D649 Anemia, unspecified: Secondary | ICD-10-CM

## 2014-01-02 LAB — VITAMIN B12: Vitamin B-12: 1186 pg/mL — ABNORMAL HIGH (ref 211–911)

## 2014-02-02 ENCOUNTER — Encounter: Payer: Self-pay | Admitting: Family Medicine

## 2014-02-02 ENCOUNTER — Ambulatory Visit (INDEPENDENT_AMBULATORY_CARE_PROVIDER_SITE_OTHER): Payer: Medicare Other | Admitting: Family Medicine

## 2014-02-02 DIAGNOSIS — R319 Hematuria, unspecified: Secondary | ICD-10-CM

## 2014-02-02 LAB — POCT URINALYSIS DIPSTICK
Blood, UA: 250
Spec Grav, UA: 1.01
pH, UA: 6

## 2014-02-02 MED ORDER — CIPROFLOXACIN HCL 250 MG PO TABS: 250.0000 mg | ORAL_TABLET | Freq: Two times a day (BID) | ORAL | Status: AC

## 2014-02-02 MED ORDER — GABAPENTIN 100 MG PO CAPS: ORAL_CAPSULE | ORAL | Status: DC

## 2014-02-02 NOTE — Progress Notes (Signed)
   Subjective:    Patient ID: Samantha Hudson, female    DOB: 01/24/1935, 79 y.o.   MRN: [id number]  Hematuria This is a new problem. The current episode started yesterday. Irritative symptoms do not include frequency. Associated symptoms include abdominal pain.   Patient had hematuria couple of times earlier today but that seemingly went away denies any dysuria abdominal pain nausea vomiting fevers  Review of Systems  Constitutional: Negative for activity change, appetite change and fatigue.  HENT: Negative for congestion.   Respiratory: Negative for shortness of breath.   Gastrointestinal: Positive for abdominal pain.  Endocrine: Negative for polydipsia and polyphagia.  Genitourinary: Positive for hematuria. Negative for frequency.  Neurological: Negative for weakness.  Psychiatric/Behavioral: Negative for confusion.       Objective:   Physical Exam  Vitals reviewed. Constitutional: She appears well-nourished. No distress.  Cardiovascular: Normal rate, regular rhythm and normal heart sounds.   No murmur heard. Pulmonary/Chest: Effort normal and breath sounds normal. No respiratory distress.  Musculoskeletal: She exhibits no edema.  Lymphadenopathy:    She has no cervical adenopathy.  Neurological: She is alert. She exhibits normal muscle tone.  Psychiatric: Her behavior is normal.          Assessment & Plan:  Hematuria -- urinalysis under microscope shows RBCs. This is concerning for the possibility of bladder cancer I believe she needs referral. We will go forward with culture. Antibiotics Cipro 250 mg twice a day for 5 days. I believe this patient does need to see urology and does need to have cystoscope. May end up having to have a CT exam if cystoscope negative

## 2014-02-04 ENCOUNTER — Other Ambulatory Visit: Payer: Self-pay | Admitting: Family Medicine

## 2014-02-04 DIAGNOSIS — R319 Hematuria, unspecified: Secondary | ICD-10-CM

## 2014-02-04 LAB — URINE CULTURE

## 2014-02-22 ENCOUNTER — Ambulatory Visit (HOSPITAL_COMMUNITY)
Admission: RE | Admit: 2014-02-22 | Discharge: 2014-02-22 | Disposition: A | Discharge status: Home / Self Care | Payer: Medicare Other | Source: Ambulatory Visit | Attending: Orthopedic Surgery | Admitting: Orthopedic Surgery

## 2014-02-22 DIAGNOSIS — M25652 Stiffness of left hip, not elsewhere classified: Secondary | ICD-10-CM | POA: Diagnosis not present

## 2014-02-22 DIAGNOSIS — M25559 Pain in unspecified hip: Secondary | ICD-10-CM | POA: Insufficient documentation

## 2014-02-22 DIAGNOSIS — M6281 Muscle weakness (generalized): Secondary | ICD-10-CM | POA: Diagnosis not present

## 2014-02-22 DIAGNOSIS — Z471 Aftercare following joint replacement surgery: Secondary | ICD-10-CM | POA: Diagnosis not present

## 2014-02-22 DIAGNOSIS — M6289 Other specified disorders of muscle: Secondary | ICD-10-CM | POA: Insufficient documentation

## 2014-02-22 DIAGNOSIS — M25552 Pain in left hip: Secondary | ICD-10-CM

## 2014-02-22 DIAGNOSIS — R269 Unspecified abnormalities of gait and mobility: Secondary | ICD-10-CM | POA: Insufficient documentation

## 2014-02-22 DIAGNOSIS — M25662 Stiffness of left knee, not elsewhere classified: Secondary | ICD-10-CM

## 2014-02-22 NOTE — Evaluation (Signed)
Physical Therapy Evaluation  Patient Details  Name: Samantha Hudson MRN: [id number] Date of Birth: 1934-10-22  Today's Date: 02/22/2014 Time: 1520-1600 PT Time Calculation (min): 40 min     Charges: 1 eval,          Visit#: 1 of 16  Re-eval: 03/24/14 Assessment Diagnosis: Rt hip hemi replacement resultign in difficlty walking, Lt hip stiffness and weakness in Lt LE.  Next MD Visit: Rowe Pavy Prior Therapy: Yes, SNF and HHPT in 2014  Authorization: BCBS Medicare    Authorization Time Period: Initial prescription for 9 visits but will liekly take 8 weeksor longer to achieve all functional strength goals.   Authorization Visit#: 1 of 9   Past Medical History:  Past Medical History  Diagnosis Date  . Hyperlipemia   . IBS (irritable bowel syndrome)   . Osteoporosis   . Fibromyalgia   . Glaucoma     HAD LASER SURGERY - NO EYE DROPS REQUIRED  . Globus hystericus   . Fibromyalgia   . Meningioma   . CAD (coronary artery disease)     HEART STENT PLACED ABOUT 2000- NO LONGER SEES CARDIOLOGIST; NO C/O OF CHEST PAINS OR SOB  . Heart murmur     MVP SINCE THE 60'S - TAKES ATENOLOL -  . Anxiety   . Swelling     BILATERAL FEET AND LEGS - ONSET OF SWELLING APRIL 2014  . GERD (gastroesophageal reflux disease)   . Arthritis   . Fracture   . CVA (cerebral vascular accident)     STROKE 7 YRS AGO - DAY AFTER BRAIN SURGERY TO REMOVE BENIGN MENINGIOMA - HAS LEFT SIDED WEAKNESS AND A LITTLE NUMBNESS  . Pain     "EXCRUCIATING PAIN" LEFT HIP - HAS A FRACTURE - IS CONFINED TO W/C AT HOME - NO WEIGHT BEARING LEFT HIP.   Past Surgical History:  Past Surgical History  Procedure Laterality Date  . Brain surgery      tumor removed  . Tonsillectomy    . Partial hysterectomy    . Glaucoma repair    . Cataract extraction    . Kidney surgery      left - HX ENLARGED LEFT KIDNEY ON XRAY - SURGERY WAS DONE ON URETER   . Colonoscopy    . Coronary angioplasty    . Hip arthroplasty Left  12/29/2012    Procedure: LEFT HIP HEMI ARTHROPLASTY ;  Surgeon: Mauri Pole, MD;  Location: [facility];  Service: Orthopedics;  Laterality: Left;    Subjective Symptoms/Limitations Symptoms: Pain in lt hip with movement, walking,  Pertinent History: Patient had 2 months (Surgery 2014, august 14) of PT [facility], then 7 weeks of HHPT and now reports for out patient physial therapy follwoign Lt hip hemi replacement. Osteoporosis, history of multiple fractures: Rt pelvis Lt pelvis, sacrum, and Rt femur. Patient has a previous history of falls breakig wrist in february 2014.  CVA 8 years ago resultign in Lt leg thigh and hip pain. history of meningioma removal.  history of fibromyalgia. Patient tends  to not eperform HEP and has not had therapy since 2014.  How long can you sit comfortably?: >90 minutes, feel stiff after How long can you stand comfortably?: < 30 minutes How long can you walk comfortably?: 20 minute Patient Stated Goals: To be able to walk longer distances, improve balance Pain Assessment Currently in Pain?: Yes Pain Score: 3  Pain Location: Hip Pain Orientation: Left Pain Radiating Towards: Greater trochanter  area to anterior thigh  Precautions/Restrictions  Precautions Precautions: None  Sensation/Coordination/Flexibility/Functional Tests Flexibility Thomas: Positive Obers: Positive 90/90: Positive Functional Tests Functional Tests: sit to stand: unable to perform withtou UE use, and 1 rep >10 seconds  Functional Tests: gait speed: 0.26m/s  Functional Tests: ely's test positive  Assessment RLE Strength Right Hip Flexion: 4/5 Right Hip ABduction: 3/5 Right Knee Flexion: 3+/5 Right Knee Extension: 4/5 (4) Right Ankle Dorsiflexion: 5/5 LLE AROM (degrees) Left Hip Flexion: 95 Left Hip External Rotation : 20 Left Hip Internal Rotation : 15 Left Hip ABduction: 20 Left Hip ADduction: 20 Left Knee Extension: -25 Left Knee Flexion: 125 Left Ankle Dorsiflexion:  -10 LLE Strength Left Hip Flexion: 3+/5 Left Hip Extension: 2+/5 Left Hip ABduction: 2+/5 Left Knee Flexion: 3+/5 Left Knee Extension:  (4-/5) Left Ankle Dorsiflexion: 5/5  Physical Therapy Assessment and Plan PT Assessment and Plan Clinical Impression Statement: Patient displasy Lt hip pain follwoign a history of Lt hip partial eplacement with pain attributed to abnormal gait secondary to Lt > Rt LE weakness and stiffness resulting in increased dependence on walker for ambualtion and decreased balance.  Pt will benefit from skilled therapeutic intervention in order to improve on the following deficits: Abnormal gait;Decreased activity tolerance;Decreased balance;Decreased coordination;Difficulty walking;Pain;Decreased strength;Impaired flexibility;Improper spinal/pelvic alignment;Improper body mechanics;Decreased range of motion;Decreased mobility;Increased fascial restricitons PT Frequency: Min 2X/week PT Duration: 8 weeks PT Plan: Perform BERG next session to further assess balance,. Initial focus on increasing LE mobility (3 D hip excursion and LE stretches) and balance training, as patient's mobility improves focus to shift to increasing LE strength and stability.     Goals Home Exercise Program Pt/caregiver will Perform Home Exercise Program: For increased ROM PT Goal: Perform Home Exercise Program - Progress: Goal set today PT Short Term Goals Time to Complete Short Term Goals: 4 weeks PT Short Term Goal 1: Patient will display increased hamstring mobility for a 90/90 hamstrign test to at least 80 degrees so she can more easily bend over to touch toes PT Short Term Goal 2: Patient will display bilateral ankle dorsiflexion of at least 5 degrees to be able to sit to stand from chair with improved mechanics/ease PT Short Term Goal 3: Patient will display Elys test of >120 degrees knee flexion to more easily perform sit to stand PT Short Term Goal 4: Patient will demosntrate increased  hip abduction/adduction bilateral to WNL PT Short Term Goal 5: Patient will displays glut max/hamstring/hip abduction strength of >3/5 MMT bilateral so patient can perform sit to stand withtou UE support PT Long Term Goals Time to Complete Long Term Goals: 8 weeks PT Long Term Goal 1: Patient will displays glut max strength of >3+/5 MMT bilateral so patient can perform 5x sit to stand without UE support in < 15 seconds PT Long Term Goal 2: Patient will display hamstrng strength >4/5 MMT so patient can ambulate at > 1.2 m/s to rturn to community ambulatign status Long Term Goal 3: Patient will display increased hip abduction strength of > 3+/5 so patient can perform single leg stand >3 seconds indicatign improved balance.   Problem List Patient Active Problem List   Diagnosis Date Noted  . Muscle weakness (generalized) 02/22/2014  . Stiffness of left knee 02/22/2014  . Hamstring tightness of left lower extremity 02/22/2014  . Pain in joint, pelvic region and thigh 02/22/2014  . Difficulty walking 04/07/2013  . Osteoporosis, unspecified 03/10/2013  . Anemia 03/10/2013  . Mild malnutrition 03/10/2013  . S/P left  hip hemiarthroplasty 12/30/2012  . Osteoporosis with pathological fracture with delayed healing 11/25/2012  . Pedal edema 10/27/2012  . Fracture of sacrum 10/02/2012  . Stricture and stenosis of esophagus 09/24/2011  . HYPERLIPIDEMIA-MIXED 11/03/2008  . CAD, UNSPECIFIED SITE 11/03/2008    PT - End of Session Activity Tolerance: Patient tolerated treatment well General Behavior During Therapy: WFL for tasks assessed/performed PT Plan of Care PT Home Exercise Plan: to be given next session (LE stretches,sqauts at kitchen counter, 3D hip excursion)  GP Functional Assessment Tool Used: FOTO 80% limited Functional Limitation: Changing and maintaining body position Changing and Maintaining Body Position Current Status (A1287): At least 80 percent but less than 100 percent  impaired, limited or restricted Changing and Maintaining Body Position Goal Status (O6767): At least 60 percent but less than 80 percent impaired, limited or restricted  Leia Alf 02/22/2014, 7:04 PM  Physician Documentation Your signature is required to indicate approval of the treatment plan as stated above.  Please sign and either send electronically or make a copy of this report for your files and return this physician signed original.   Please mark one 1.__approve of plan  2. ___approve of plan with the following conditions.   ______________________________                                                          _____________________ Physician Signature                                                                                                             Date

## 2014-02-26 ENCOUNTER — Ambulatory Visit (HOSPITAL_COMMUNITY)
Admission: RE | Admit: 2014-02-26 | Discharge: 2014-02-26 | Disposition: A | Discharge status: Home / Self Care | Payer: Medicare Other | Source: Ambulatory Visit | Attending: Family Medicine | Admitting: Family Medicine

## 2014-02-26 DIAGNOSIS — Z471 Aftercare following joint replacement surgery: Secondary | ICD-10-CM | POA: Diagnosis not present

## 2014-02-26 NOTE — Progress Notes (Signed)
Physical Therapy Treatment Patient Details  Name: Samantha Hudson MRN: [id number] Date of Birth: 11/23/1934  Today's Date: 02/26/2014 Time: 7829-5621 PT Time Calculation (min): 48 min TE 3086-5784, NM 6962-9528  Visit#: 2 of 16  Re-eval: 03/24/14 Assessment Diagnosis: Rt hip hemi replacement resultign in difficlty walking, Lt hip stiffness and weakness in Lt LE.  Next MD Visit: Rowe Pavy   Subjective: Symptoms/Limitations Symptoms: Pt reports pain in Lt hip down back and front of leg; pt reports taking pain medication prior to PT with pain decreasing to 8/10.  Pain Assessment Currently in Pain?: Yes Pain Score: 8  Pain Location: Hip Pain Orientation: Left   Exercise/Treatments Mobility/Balance     Berg Balance Test Sit to Stand: Needs minimal aid to stand or to stabilize Standing Unsupported: Able to stand 30 seconds unsupported Sitting with Back Unsupported but Feet Supported on Floor or Stool: Able to sit safely and securely 2 minutes Stand to Sit: Controls descent by using hands Transfers: Able to transfer safely, definite need of hands Standing Unsupported with Eyes Closed: Able to stand 10 seconds with supervision (Leans to the Rt) Standing Ubsupported with Feet Together: Able to place feet together independently and stand 1 minute safely From Standing, Reach Forward with Outstretched Arm: Can reach forward >5 cm safely (2") From Standing Position, Pick up Object from Floor: Unable to pick up and needs supervision From Standing Position, Turn to Look Behind Over each Shoulder: Turn sideways only but maintains balance Turn 360 Degrees: Able to turn 360 degrees safely but slowly (Rt 60", Lt 55") Standing Unsupported, Alternately Place Feet on Step/Stool: Able to complete >2 steps/needs minimal assist (40" for 4 steps each LE) Standing Unsupported, One Foot in Front: Loses balance while stepping or standing (Needs 1 HHA to get to position then holds for Rt 7", L  2") Standing on One Leg: Unable to try or needs assist to prevent fall Total Score: 28  Stretches Active Hamstring Stretch: 3 reps;30 seconds;Limitations Active Hamstring Stretch Limitations: 8" Box Gastroc Stretch: 3 reps;30 seconds;Limitations Gastroc Stretch Limitations: Slantboard Standing Other Standing Knee Exercises: 3D Hip Excursion x10  Physical Therapy Assessment and Plan PT Assessment and Plan Clinical Impression Statement: Initiated PT POC with therapeutic and neuromuscular exercises. Berg Balance assessment performed with pt scoring 28 points, indicating high fall risk.   Pt will benefit from skilled therapeutic intervention in order to improve on the following deficits: Abnormal gait;Decreased activity tolerance;Decreased balance;Decreased coordination;Difficulty walking;Pain;Decreased strength;Impaired flexibility;Improper spinal/pelvic alignment;Improper body mechanics;Decreased range of motion;Decreased mobility;Increased fascial restricitons PT Plan: Initial focus on increasing LE mobility (LE stretches) and balance training, as patient's mobility improves focus to shift to increasing LE strength and stability.  Give HEP.     Goals PT Short Term Goals PT Short Term Goal 1: Patient will display increased hamstring mobility for a 90/90 hamstrign test to at least 80 degrees so she can more easily bend over to touch toes PT Short Term Goal 1 - Progress: Progressing toward goal PT Short Term Goal 2: Patient will display bilateral ankle dorsiflexion of at least 5 degrees to be able to sit to stand from chair with improved mechanics/ease PT Short Term Goal 2 - Progress: Progressing toward goal  Problem List Patient Active Problem List   Diagnosis Date Noted  . Muscle weakness (generalized) 02/22/2014  . Stiffness of left knee 02/22/2014  . Hamstring tightness of left lower extremity 02/22/2014  . Pain in joint, pelvic region and thigh 02/22/2014  . Difficulty walking  04/07/2013  . Osteoporosis, unspecified 03/10/2013  . Anemia 03/10/2013  . Mild malnutrition 03/10/2013  . S/P left hip hemiarthroplasty 12/30/2012  . Osteoporosis with pathological fracture with delayed healing 11/25/2012  . Pedal edema 10/27/2012  . Fracture of sacrum 10/02/2012  . Stricture and stenosis of esophagus 09/24/2011  . HYPERLIPIDEMIA-MIXED 11/03/2008  . CAD, UNSPECIFIED SITE 11/03/2008    PT - End of Session Activity Tolerance: Patient tolerated treatment well General Behavior During Therapy: [employer] [facility] [employer] for tasks assessed/performed  GP    Erine Phenix 02/26/2014, 2:00 PM

## 2014-03-01 ENCOUNTER — Ambulatory Visit (HOSPITAL_COMMUNITY)
Admission: RE | Admit: 2014-03-01 | Discharge: 2014-03-01 | Disposition: A | Discharge status: Home / Self Care | Payer: Medicare Other | Source: Ambulatory Visit | Attending: Orthopedic Surgery | Admitting: Orthopedic Surgery

## 2014-03-01 DIAGNOSIS — Z471 Aftercare following joint replacement surgery: Secondary | ICD-10-CM | POA: Diagnosis not present

## 2014-03-01 NOTE — Progress Notes (Signed)
Physical Therapy Treatment Patient Details  Name: Samantha Hudson MRN: [id number] Date of Birth: November 14, 1934  Today's Date: 03/01/2014 Time: 7121-9758 PT Time Calculation (min): 45 min Visit#: 3 of 16  Re-eval: 03/24/14 Authorization Visit#: 3 of 9  Charges:  therex 45  Subjective: Symptoms/Limitations Symptoms: Pt states her usual pains are there but her hip is feeling better than last visit.  States she took a pain pill one hour ago.   Pain Assessment Currently in Pain?: Yes Pain Score: 5  Pain Location: Hip   Exercise/Treatments Stretches Active Hamstring Stretch: 3 reps;30 seconds;Limitations Active Hamstring Stretch Limitations: 8" Box Gastroc Stretch: 3 reps;30 seconds;Limitations Gastroc Stretch Limitations: Slantboard Standing SLS: max of 3 trials:  Rt: 14", Lt: 4" Other Standing Knee Exercises: 3D Hip Excursion x10 Supine Bridges: 10 reps Sidelying Hip ABduction: 10 reps Clams: 10 reps Lt hip only Prone  Hamstring Curl: 10 reps Hip Extension: 10 reps     Physical Therapy Assessment and Plan PT Assessment and Plan Clinical Impression Statement: Pt takes extended time to complete activities and functional mobility.  Pt /spouse concerned with not having a HEP.  Instructed with HEP and given written instructions for HEP.  Noted extreme tightness in posterior LE's and forward bent posture with ambulation.  No complaints reported today during session. PT Plan: Initial focus on increasing LE mobility (LE stretches) and balance training, as patient's mobility improves focus to shift to increasing LE strength and stability. Review HEP.  Give 3D hip excursion instructions for HEP next visit.      Problem List Patient Active Problem List   Diagnosis Date Noted  . Muscle weakness (generalized) 02/22/2014  . Stiffness of left knee 02/22/2014  . Hamstring tightness of left lower extremity 02/22/2014  . Pain in joint, pelvic region and thigh 02/22/2014  . Difficulty  walking 04/07/2013  . Osteoporosis, unspecified 03/10/2013  . Anemia 03/10/2013  . Mild malnutrition 03/10/2013  . S/P left hip hemiarthroplasty 12/30/2012  . Osteoporosis with pathological fracture with delayed healing 11/25/2012  . Pedal edema 10/27/2012  . Fracture of sacrum 10/02/2012  . Stricture and stenosis of esophagus 09/24/2011  . HYPERLIPIDEMIA-MIXED 11/03/2008  . CAD, UNSPECIFIED SITE 11/03/2008    PT Plan of Care PT Home Exercise Plan: Mat exercises to increase mobility (prone HS curls, hip extension, SL hip abduction, supine bridging), standing squats and SLS at kitchen counter.   Teena Irani, PTA/CLT 03/01/2014, 4:32 PM

## 2014-03-03 ENCOUNTER — Ambulatory Visit (HOSPITAL_COMMUNITY): Payer: BC Managed Care – PPO | Admitting: Physical Therapy

## 2014-03-04 ENCOUNTER — Ambulatory Visit (HOSPITAL_COMMUNITY)
Admission: RE | Admit: 2014-03-04 | Discharge: 2014-03-04 | Disposition: A | Discharge status: Home / Self Care | Payer: Medicare Other | Source: Ambulatory Visit | Attending: Physical Therapy | Admitting: Physical Therapy

## 2014-03-04 DIAGNOSIS — Z471 Aftercare following joint replacement surgery: Secondary | ICD-10-CM | POA: Diagnosis not present

## 2014-03-04 NOTE — Progress Notes (Signed)
Physical Therapy Treatment Patient Details  Name: SHARANDA SHINAULT MRN: [id number] Date of Birth: 1935/01/20  Today's Date: 03/04/2014 Time: 1430-1520 PT Time Calculation (min): 50 min TE 2841-3244  Visit#: 4 of 16  Re-eval: 03/24/14 Assessment Diagnosis: Rt hip hemi replacement resultign in difficlty walking, Lt hip stiffness and weakness in Lt LE.  Next MD Visit: Rowe Pavy  Authorization: [employer] Medicare   Authorization Visit#: 4 of 9   Subjective: Symptoms/Limitations Symptoms: Pt reports mild/moderate complaints of hip pain, and had to take a tramadol ~45 minutes prior to PT visit.   Pt reports she has been performing HEP, with most difficulty with SLR hip extension > abduction.  Pain Assessment Currently in Pain?: Yes Pain Score: 4  Pain Location: Hip Pain Orientation: Left   Exercise/Treatments Stretches Active Hamstring Stretch: 3 reps;30 seconds;Limitations Active Hamstring Stretch Limitations: 12" Step Quad Stretch: 2 reps;30 seconds;Limitations Quad Stretch Limitations: Prone with rope Hip Flexor Stretch: 2 reps;20 seconds;Limitations Hip Flexor Stretch Limitations: 12" Step Gastroc Stretch: 3 reps;30 seconds;Limitations Gastroc Stretch Limitations: Slantboard Standing Other Standing Knee Exercises: 3D Hip Excursion x10 with manutal facilitation for Fl/Ext and Rt Rot Supine Bridges: 15 reps Straight Leg Raises: 15 reps Sidelying Hip ABduction: 15 reps;Left Clams: 15 reps Lt LE Prone  Hamstring Curl: 15 reps Hip Extension: 15 reps;AAROM;Left      Physical Therapy Assessment and Plan PT Assessment and Plan Clinical Impression Statement: Reviewed HEP, with increased reps at todays treatment session.  Progressed stretching program, adding quad and hip flexor stretching on (B) LE secondary forward flexed posture during gait.  Encouraged pt to complete all stretches and strengtrhening exercises secondary to muscular tightness and weakness on (B) LE.    Pt will benefit from skilled therapeutic intervention in order to improve on the following deficits: Abnormal gait;Decreased activity tolerance;Decreased balance;Decreased coordination;Difficulty walking;Pain;Decreased strength;Impaired flexibility;Improper spinal/pelvic alignment;Improper body mechanics;Decreased range of motion;Decreased mobility;Increased fascial restricitons PT Frequency: Min 2X/week PT Duration: 8 weeks PT Plan: Progress stretching exercises to tolerance, with focus on hip extension strengthening and CKC exercises.      Goals PT Short Term Goals PT Short Term Goal 1: Patient will display increased hamstring mobility for a 90/90 hamstrign test to at least 80 degrees so she can more easily bend over to touch toes PT Short Term Goal 1 - Progress: Progressing toward goal PT Short Term Goal 2: Patient will display bilateral ankle dorsiflexion of at least 5 degrees to be able to sit to stand from chair with improved mechanics/ease PT Short Term Goal 2 - Progress: Progressing toward goal PT Short Term Goal 3: Patient will display Elys test of >120 degrees knee flexion to more easily perform sit to stand PT Short Term Goal 3 - Progress: Progressing toward goal PT Short Term Goal 4: Patient will demosntrate increased hip abduction/adduction bilateral to WNL PT Short Term Goal 4 - Progress: Progressing toward goal PT Short Term Goal 5: Patient will displays glut max/hamstring/hip abduction strength of >3/5 MMT bilateral so patient can perform sit to stand without UE support PT Short Term Goal 5 - Progress: Progressing toward goal  Problem List Patient Active Problem List   Diagnosis Date Noted  . Muscle weakness (generalized) 02/22/2014  . Stiffness of left knee 02/22/2014  . Hamstring tightness of left lower extremity 02/22/2014  . Pain in joint, pelvic region and thigh 02/22/2014  . Difficulty walking 04/07/2013  . Osteoporosis, unspecified 03/10/2013  . Anemia 03/10/2013   . Mild malnutrition 03/10/2013  . S/P  left hip hemiarthroplasty 12/30/2012  . Osteoporosis with pathological fracture with delayed healing 11/25/2012  . Pedal edema 10/27/2012  . Fracture of sacrum 10/02/2012  . Stricture and stenosis of esophagus 09/24/2011  . HYPERLIPIDEMIA-MIXED 11/03/2008  . CAD, UNSPECIFIED SITE 11/03/2008    PT - End of Session Activity Tolerance: Patient tolerated treatment well General Behavior During Therapy: [facility] for tasks assessed/performed   Raychelle Hudman 03/04/2014, 3:24 PM

## 2014-03-05 ENCOUNTER — Ambulatory Visit (HOSPITAL_COMMUNITY)
Admission: RE | Admit: 2014-03-05 | Discharge: 2014-03-05 | Disposition: A | Discharge status: Home / Self Care | Payer: Medicare Other | Source: Ambulatory Visit | Attending: Orthopedic Surgery | Admitting: Orthopedic Surgery

## 2014-03-05 DIAGNOSIS — Z471 Aftercare following joint replacement surgery: Secondary | ICD-10-CM | POA: Diagnosis not present

## 2014-03-05 NOTE — Progress Notes (Signed)
Physical Therapy Treatment Patient Details  Name: Samantha Hudson MRN: [id number] Date of Birth: March 30, 1935  Today's Date: 03/05/2014 Time: 1435-1520 PT Time Calculation (min): 45 min Charge: TE [id number]  Visit#: 5 of 16  Re-eval: 03/24/14 Assessment Diagnosis: Rt hip hemi replacement resultign in difficlty walking, Lt hip stiffness and weakness in Lt LE.  Next MD Visit: Rowe Pavy Prior Therapy: Yes, SNF and HHPT in 2014  Authorization: BCBS Medicare  Authorization Time Period: Initial prescription for 9 visits but will liekly take 8 weeksor longer to achieve all functional strength goals.   Authorization Visit#: 5 of 9   Subjective: Symptoms/Limitations Symptoms: Pt stated she took pain meds around 1:00,pm  current pain Lt hip and Lt shoulder pain scale 5/10 today. Pain Assessment Currently in Pain?: Yes Pain Score: 5  Pain Location: Hip Pain Orientation: Left  Precautions/Restrictions  Precautions Precautions: None  Exercise/Treatments Stretches Active Hamstring Stretch: 3 reps;30 seconds;Limitations Active Hamstring Stretch Limitations: instructed supine with rope (belt) for HEP Quad Stretch: 3 reps;30 seconds;Limitations Quad Stretch Limitations: Prone with rope Hip Flexor Stretch: 2 reps;20 seconds;Limitations Hip Flexor Stretch Limitations: 12" Step Gastroc Stretch: 3 reps;30 seconds;Limitations Gastroc Stretch Limitations: at wall for HEP Aerobic Stationary Bike: Nustep hill 3, resistance 3 x 8' Standing Heel Raises: 2 sets;10 reps;Limitations Heel Raises Limitations: heel and toe rasies Other Standing Knee Exercises: 3D Hip Excursion x10 with manutal facilitation for Fl/Ext and Rt Rot Other Standing Knee Exercises: Marching 10x5" Supine Bridges: 15 reps    Physical Therapy Assessment and Plan PT Assessment and Plan Clinical Impression Statement: Progressed to CKC activites to improve tolerance with standing and LE strengthening, pt with PTA  assistance or HHA through all exercises to reduce fear of falling.  Therapist facilitatin for appropraite form and technqiue with all new exercises.  Pt very tight hip and LE musculature Bil, pt and husband instructed and given HEP exercises for stretches to begin at hojme.   PT Plan: Progress stretching exercises to tolerance, with focus on hip extension strengthening and CKC exercises.      Goals PT Short Term Goals PT Short Term Goal 1: Patient will display increased hamstring mobility for a 90/90 hamstrign test to at least 80 degrees so she can more easily bend over to touch toes PT Short Term Goal 1 - Progress: Progressing toward goal PT Short Term Goal 2: Patient will display bilateral ankle dorsiflexion of at least 5 degrees to be able to sit to stand from chair with improved mechanics/ease PT Short Term Goal 2 - Progress: Progressing toward goal PT Short Term Goal 3: Patient will display Elys test of >120 degrees knee flexion to more easily perform sit to stand PT Short Term Goal 3 - Progress: Progressing toward goal PT Short Term Goal 4: Patient will demosntrate increased hip abduction/adduction bilateral to WNL PT Short Term Goal 4 - Progress: Progressing toward goal PT Short Term Goal 5: Patient will displays glut max/hamstring/hip abduction strength of >3/5 MMT bilateral so patient can perform sit to stand without UE support PT Long Term Goals PT Long Term Goal 1: Patient will displays glut max strength of >3+/5 MMT bilateral so patient can perform 5x sit to stand without UE support in < 15 seconds PT Long Term Goal 1 - Progress: Progressing toward goal PT Long Term Goal 2: Patient will display hamstrng strength >4/5 MMT so patient can ambulate at > 1.2 m/s to rturn to community ambulatign status PT Long Term Goal 2 - Progress: Progressing  toward goal Long Term Goal 3: Patient will display increased hip abduction strength of > 3+/5 so patient can perform single leg stand >3 seconds  indicatign improved balance.  Long Term Goal 3 Progress: Progressing toward goal  Problem List Patient Active Problem List   Diagnosis Date Noted  . Muscle weakness (generalized) 02/22/2014  . Stiffness of left knee 02/22/2014  . Hamstring tightness of left lower extremity 02/22/2014  . Pain in joint, pelvic region and thigh 02/22/2014  . Difficulty walking 04/07/2013  . Osteoporosis, unspecified 03/10/2013  . Anemia 03/10/2013  . Mild malnutrition 03/10/2013  . S/P left hip hemiarthroplasty 12/30/2012  . Osteoporosis with pathological fracture with delayed healing 11/25/2012  . Pedal edema 10/27/2012  . Fracture of sacrum 10/02/2012  . Stricture and stenosis of esophagus 09/24/2011  . HYPERLIPIDEMIA-MIXED 11/03/2008  . CAD, UNSPECIFIED SITE 11/03/2008    PT - End of Session Activity Tolerance: Patient tolerated treatment well General Behavior During Therapy: WFL for tasks assessed/performed PT Plan of Care PT Home Exercise Plan: Given supine hamsting, prone quad, and standing gastroc stretches HEP  GP    Aldona Lento 03/05/2014, 6:34 PM

## 2014-03-08 ENCOUNTER — Ambulatory Visit (HOSPITAL_COMMUNITY)
Admission: RE | Admit: 2014-03-08 | Discharge: 2014-03-08 | Disposition: A | Discharge status: Home / Self Care | Payer: Medicare Other | Source: Ambulatory Visit | Attending: Orthopedic Surgery | Admitting: Orthopedic Surgery

## 2014-03-08 ENCOUNTER — Other Ambulatory Visit: Payer: Self-pay | Admitting: Family Medicine

## 2014-03-08 DIAGNOSIS — Z471 Aftercare following joint replacement surgery: Secondary | ICD-10-CM | POA: Diagnosis not present

## 2014-03-08 NOTE — Progress Notes (Signed)
Physical Therapy Treatment Patient Details  Name: Samantha Hudson MRN: [id number] Date of Birth: Oct 22, 1934  Today's Date: 03/08/2014 Time: 6811-5726 PT Time Calculation (min): 48 min  Visit#: 7 of 16  Re-eval: 03/24/14 Authorization: BCBS Medicare  Authorization Time Period: Initial prescription for 9 visits but will likely take 8 weeks or longer to achieve all functional strength goals.   Authorization Visit#: 7 of 9  Charges:  therex 2035-5974 (26') , manual 1500-1520 (20')  Subjective: Symptoms/Limitations Symptoms: Pt states her Lt thigh continues to hurt her.  States she's been doing her exercises at home.  Currently wtih 5/10 pain. Pain Assessment Currently in Pain?: Yes Pain Score: 5  Pain Location: Hip Pain Orientation: Left   Exercise/Treatments Stretches Quad Stretch: 3 reps;30 seconds;Limitations Quad Stretch Limitations: Prone with rope Supine Bridges: 15 reps Prone  Hamstring Curl: 10 reps Hip Extension: 10 reps   Manual Therapy Manual Therapy: Other (comment) Other Manual Therapy: PROM to Lt quad and ankle.  Myofascial techniques for Lt plantar fascia, quad, ITB.  Joint mob for Lt foot   Physical Therapy Assessment and Plan PT Assessment and Plan Clinical Impression Statement: Patient with extreme tightness in Lt ITB musculature, quadricep, hip rotators and ankle.  Added Myofascial techniques with much improved gait at end of session.   Encouaged patient to focus on larger step length and heel to toe gait.  Spouse reports compliance with HEP. PT Plan: Progress stretching exercises to tolerance.  continue to work on improving mobility.     Problem List Patient Active Problem List   Diagnosis Date Noted  . Muscle weakness (generalized) 02/22/2014  . Stiffness of left knee 02/22/2014  . Hamstring tightness of left lower extremity 02/22/2014  . Pain in joint, pelvic region and thigh 02/22/2014  . Difficulty walking 04/07/2013  . Osteoporosis,  unspecified 03/10/2013  . Anemia 03/10/2013  . Mild malnutrition 03/10/2013  . S/P left hip hemiarthroplasty 12/30/2012  . Osteoporosis with pathological fracture with delayed healing 11/25/2012  . Pedal edema 10/27/2012  . Fracture of sacrum 10/02/2012  . Stricture and stenosis of esophagus 09/24/2011  . HYPERLIPIDEMIA-MIXED 11/03/2008  . CAD, UNSPECIFIED SITE 11/03/2008    PT - End of Session Activity Tolerance: Patient tolerated treatment well General Behavior During Therapy: [facility] for tasks assessed/performed  GP    Mare Ferrari, Sheridan Gettel B 03/08/2014, 3:49 PM

## 2014-03-12 ENCOUNTER — Ambulatory Visit (HOSPITAL_COMMUNITY)
Admission: RE | Admit: 2014-03-12 | Discharge: 2014-03-12 | Disposition: A | Discharge status: Home / Self Care | Payer: Medicare Other | Source: Ambulatory Visit | Attending: Orthopedic Surgery | Admitting: Orthopedic Surgery

## 2014-03-12 DIAGNOSIS — Z471 Aftercare following joint replacement surgery: Secondary | ICD-10-CM | POA: Diagnosis not present

## 2014-03-12 NOTE — Progress Notes (Signed)
Physical Therapy Treatment Patient Details  Name: Samantha Hudson MRN: [id number] Date of Birth: July 12, 1934  Today's Date: 03/12/2014 Time: 4665-9935 PT Time Calculation (min): 48 min Charge: TE [id number]  Visit#: 8 of 16  Re-eval: 03/24/14 Assessment Diagnosis: Rt hip hemi replacement resultign in difficlty walking, Lt hip stiffness and weakness in Lt LE.  Next MD Visit: Rowe Pavy Prior Therapy: Yes, SNF and HHPT in 2014  Authorization: BCBS Medicare  Authorization Time Period: Initial prescription for 9 visits but will likely take 8 weeks or longer to achieve all functional strength goals.   Authorization Visit#: 8 of 9   Subjective: Symptoms/Limitations Symptoms: Pt stated she is compliant with HEP, daughter and husband have been helping with stretches at home;  Current pain scale 4/10 pain.   Pain Assessment Currently in Pain?: Yes Pain Score: 4  Pain Location: Hip Pain Orientation: Left  Precautions/Restrictions  Precautions Precautions: None  Exercise/Treatments Stretches Active Hamstring Stretch: 3 reps;30 seconds;Limitations Active Hamstring Stretch Limitations: 14" on 2nd step Quad Stretch: Limitations;4 reps;20 seconds Quad Stretch Limitations: prone with manual  Hip Flexor Stretch: 3 reps;30 seconds;Limitations Hip Flexor Stretch Limitations: 14in step Gastroc Stretch: 3 reps;30 seconds;Limitations Gastroc Stretch Limitations: slant board Standing Gait Training: 2D hip excursion with therapist facilitation to improve hip mobility with gait 2x 226 Other Standing Knee Exercises: 3D hip excursion with therapist facilitation to improve hip mobiltyu Other Standing Knee Exercises: lumbar extension 10x Seated Other Seated Knee Exercises: 3D thoracic excursion 5x each with Bil LE to improve extension Prone  Hamstring Curl: 10 reps Other Prone Exercises: POE 3x 20" Other Prone Exercises: Manual stretches 5x 10" IR/ER      Physical Therapy Assessment  and Plan PT Assessment and Plan Clinical Impression Statement: Session focus on mobility exercises and stretches to improve flexibility of hip.  Therapist facilitation to improve mobility with gait.  Manual techniques complete to improve flexibility in prone position fo improve hip rotation, quadriceps.  Add thoracic and lumbar extension activities to improve posture to improve weight distribution to reduce stress on LE with gait.  Vast improvements with gait and posture presented at end of session. PT Plan: Re-eval next session per initial prescription.  Continue progressing stretching exercises per tolerance and improve mobility.    Goals PT Short Term Goals PT Short Term Goal 1: Patient will display increased hamstring mobility for a 90/90 hamstrign test to at least 80 degrees so she can more easily bend over to touch toes PT Short Term Goal 1 - Progress: Progressing toward goal PT Short Term Goal 2: Patient will display bilateral ankle dorsiflexion of at least 5 degrees to be able to sit to stand from chair with improved mechanics/ease PT Short Term Goal 2 - Progress: Progressing toward goal PT Short Term Goal 3: Patient will display Elys test of >120 degrees knee flexion to more easily perform sit to stand PT Short Term Goal 3 - Progress: Progressing toward goal PT Short Term Goal 4: Patient will demosntrate increased hip abduction/adduction bilateral to WNL PT Short Term Goal 5: Patient will displays glut max/hamstring/hip abduction strength of >3/5 MMT bilateral so patient can perform sit to stand without UE support PT Short Term Goal 5 - Progress: Progressing toward goal PT Long Term Goals PT Long Term Goal 1: Patient will displays glut max strength of >3+/5 MMT bilateral so patient can perform 5x sit to stand without UE support in < 15 seconds PT Long Term Goal 2: Patient will display hamstrng strength >  4/5 MMT so patient can ambulate at > 1.2 m/s to rturn to community ambulatign  status Long Term Goal 3: Patient will display increased hip abduction strength of > 3+/5 so patient can perform single leg stand >3 seconds indicatign improved balance.   Problem List Patient Active Problem List   Diagnosis Date Noted  . Muscle weakness (generalized) 02/22/2014  . Stiffness of left knee 02/22/2014  . Hamstring tightness of left lower extremity 02/22/2014  . Pain in joint, pelvic region and thigh 02/22/2014  . Difficulty walking 04/07/2013  . Osteoporosis, unspecified 03/10/2013  . Anemia 03/10/2013  . Mild malnutrition 03/10/2013  . S/P left hip hemiarthroplasty 12/30/2012  . Osteoporosis with pathological fracture with delayed healing 11/25/2012  . Pedal edema 10/27/2012  . Fracture of sacrum 10/02/2012  . Stricture and stenosis of esophagus 09/24/2011  . HYPERLIPIDEMIA-MIXED 11/03/2008  . CAD, UNSPECIFIED SITE 11/03/2008    PT - End of Session Activity Tolerance: Patient tolerated treatment well General Behavior During Therapy: [facility] for tasks assessed/performed  GP    Aldona Lento 03/12/2014, 5:20 PM

## 2014-03-15 ENCOUNTER — Ambulatory Visit (HOSPITAL_COMMUNITY)
Admission: RE | Admit: 2014-03-15 | Discharge: 2014-03-15 | Disposition: A | Discharge status: Home / Self Care | Payer: Medicare Other | Source: Ambulatory Visit | Attending: Orthopedic Surgery | Admitting: Orthopedic Surgery

## 2014-03-15 DIAGNOSIS — Z471 Aftercare following joint replacement surgery: Secondary | ICD-10-CM | POA: Diagnosis not present

## 2014-03-15 NOTE — Progress Notes (Signed)
Physical Therapy Reevaluation (part 1)  Patient Details  Name: Samantha Hudson MRN: [id number] Date of Birth: 06/25/1934  Today's Date: 03/15/2014 Time: 1435-1525 PT Time Calculation (min): 50 min Charges:  therex 1435-1500 , MMT/ROM testing 1500-1515, self care 1515-1525              Visit#: 9 of 16  Re-eval: 03/24/14 Assessment Diagnosis: Rt hip hemi replacement resultign in difficlty walking, Lt hip stiffness and weakness in Lt LE.  Next MD Visit: rescheduled with Dr. Olin 12/3 Prior Therapy: Yes, SNF and HHPT in 2014  Authorization: BCBS Medicare    Authorization Time Period: Initial prescription for 9 visits but will likely take 8 weeks or longer to achieve all functional strength goals.   Authorization Visit#: 9 of 10   Subjective Symptoms/Limitations Symptoms: Pt stated pain scale 4/10, reports she has improved  How long can you sit comfortably?: 90 minutes comfortably ( was>90 minutes, feel stiff after) How long can you stand comfortably?: >1 hour in grocery store last weekend ( 30 minutes) How long can you walk comfortably?: walk and stand in grocery store >1 hour ((20 minute) Pain Assessment Currently in Pain?: Yes Pain Score: 4  Pain Location: Hip Pain Orientation: Left  Precautions/Restrictions  Precautions Precautions: None   Sensation/Coordination/Flexibility/Functional Tests Flexibility Thomas: Positive Obers: Positive 90/90: Positive Functional Tests Functional Tests: sit to stand: unable to perform withtou UE use, and 1 rep >10 seconds  (was unable to perform without UE use) Functional Tests: gait speed: 0.7m/s  Functional Tests: ely's test positive  Assessment RLE Strength Right Hip Flexion: 3+/5 (was 4/5) Right Hip Extension: 3+/5 (no value given) Right Hip ABduction: 3+/5 (was 3/5) Right Knee Flexion: 4/5 (was 3+/5) Right Knee Extension: 4/5 (was 4/5) Right Ankle Dorsiflexion: 2-/5 (patient with -10 degrees dorsiflexion) LLE Strength Left  Hip Flexion: 4/5 (was 3+/5) Left Hip Extension: 3/5 (was 2+/5) Left Hip ABduction: 3-/5 (was 2+/5) Left Knee Flexion: 4/5 (was 3+/5) Left Knee Extension: 4/5 (was  4/5) Left Ankle Dorsiflexion: 2-/5 (Pt with -10 dorsiflexion)  Exercise/Treatments Stretches Active Hamstring Stretch: 3 reps;30 seconds;Limitations Active Hamstring Stretch Limitations: 14" on 2nd step Gastroc Stretch: 3 reps;30 seconds;Limitations Gastroc Stretch Limitations: slant board Standing Other Standing Knee Exercises: 3D hip excursion with therapist facilitation to improve hip mobiltyu Other Standing Knee Exercises: lumbar extension 10x        Physical Therapy Assessment and Plan PT Assessment and Plan Clinical Impression Statement: Completed functional measures for re-evaluation, however unable to complete balance and gait assessment due to time constraints.  Pt with overall improvment in strength with exception of Rt hip flexors and dorsiflexors (mainly due to decreased AROM).  Pt is progressing well towards all goals and would benefit from continued therapy.   PT Plan: Complete Berg, gait assessment and gcodes next visit to finish re-evaluation.  Pt would benefit from continued therapy to meet all goals.     Goals Home Exercise Program PT Goal: Perform Home Exercise Program - Progress: Met PT Short Term Goals PT Short Term Goal 1: Patient will display increased hamstring mobility for a 90/90 hamstring test to at least 80 degrees so she can more easily bend over to touch toes PT Short Term Goal 1 - Progress: Progressing toward goal PT Short Term Goal 2: Patient will display bilateral ankle dorsiflexion of at least 5 degrees to be able to sit to stand from chair with improved mechanics/ease PT Short Term Goal 2 - Progress: Progressing toward goal PT Short Term Goal   3: Patient will display Elys test of >120 degrees knee flexion to more easily perform sit to stand PT Short Term Goal 3 - Progress: Progressing  toward goal PT Short Term Goal 4: Patient will demosntrate increased hip abduction/adduction bilateral to WNL PT Short Term Goal 4 - Progress: Progressing toward goal PT Short Term Goal 5: Patient will displays glut max/hamstring/hip abduction strength of >3/5 MMT bilateral so patient can perform sit to stand without UE support PT Short Term Goal 5 - Progress: Progressing toward goal PT Long Term Goals PT Long Term Goal 1: Patient will displays glut max strength of >3+/5 MMT bilateral so patient can perform 5x sit to stand without UE support in < 15 seconds PT Long Term Goal 1 - Progress: Progressing toward goal PT Long Term Goal 2: Patient will display hamstrng strength >4/5 MMT so patient can ambulate at > 1.2 m/s to rturn to community ambulatign status PT Long Term Goal 2 - Progress: Progressing toward goal Long Term Goal 3: Patient will display increased hip abduction strength of > 3+/5 so patient can perform single leg stand >3 seconds indicatign improved balance.  Long Term Goal 3 Progress: Progressing toward goal  Problem List Patient Active Problem List   Diagnosis Date Noted  . Muscle weakness (generalized) 02/22/2014  . Stiffness of left knee 02/22/2014  . Hamstring tightness of left lower extremity 02/22/2014  . Pain in joint, pelvic region and thigh 02/22/2014  . Difficulty walking 04/07/2013  . Osteoporosis, unspecified 03/10/2013  . Anemia 03/10/2013  . Mild malnutrition 03/10/2013  . S/P left hip hemiarthroplasty 12/30/2012  . Osteoporosis with pathological fracture with delayed healing 11/25/2012  . Pedal edema 10/27/2012  . Fracture of sacrum 10/02/2012  . Stricture and stenosis of esophagus 09/24/2011  . HYPERLIPIDEMIA-MIXED 11/03/2008  . CAD, UNSPECIFIED SITE 11/03/2008    PT - End of Session Activity Tolerance: Patient tolerated treatment well General Behavior During Therapy: WFL for tasks assessed/performed  GP     B , PTA/CLT 03/15/2014, 4:00  PM   

## 2014-03-17 ENCOUNTER — Ambulatory Visit (HOSPITAL_COMMUNITY): Payer: BC Managed Care – PPO | Admitting: Physical Therapy

## 2014-03-19 ENCOUNTER — Ambulatory Visit (HOSPITAL_COMMUNITY)
Admission: RE | Admit: 2014-03-19 | Discharge: 2014-03-19 | Disposition: A | Discharge status: Home / Self Care | Payer: Medicare Other | Source: Ambulatory Visit | Attending: Orthopedic Surgery | Admitting: Orthopedic Surgery

## 2014-03-19 DIAGNOSIS — Z471 Aftercare following joint replacement surgery: Secondary | ICD-10-CM | POA: Diagnosis not present

## 2014-03-19 NOTE — Progress Notes (Signed)
Physical Therapy Treatment Patient Details  Name: Samantha Hudson MRN: [id number] Date of Birth: 1934-10-05  Today's Date: 03/19/2014 Time: 0175-1025 PT Time Calculation (min): 53 min Charge: TE 8527-7824, 2353-6144 Performance testing 1445-1500,   Visit#: 10 of 16  Re-eval: 03/24/14 Assessment Diagnosis: Rt hip hemi replacement resultign in difficlty walking, Lt hip stiffness and weakness in Lt LE.  Next MD Visit: rescheduled with Dr. Alvan Dame 12/3 Prior Therapy: Yes, SNF and HHPT in 2014  Authorization: [employer] Medicare  Authorization Time Period: Initial prescription for 9 visits but will likely take 8 weeks or longer to achieve all functional strength goals.   Authorization Visit#: 10 of 20   Subjective: Symptoms/Limitations Symptoms: Pain scale 5/10 Lt hip, has difficutly sleeping last night Pain Assessment Currently in Pain?: Yes Pain Score: 5  Pain Location: Hip Pain Orientation: Left  Precautions/Restrictions  Precautions Precautions: None  Exercise/Treatments Mobility/Balance     Berg Balance Test Sit to Stand: Able to stand using hands after several tries (was 1) Standing Unsupported: Able to stand 2 minutes with supervision (was 2) Sitting with Back Unsupported but Feet Supported on Floor or Stool: Able to sit safely and securely 2 minutes (was 4) Stand to Sit: Controls descent by using hands (was 3) Transfers: Able to transfer safely, definite need of hands (was 3) Standing Unsupported with Eyes Closed: Able to stand 10 seconds safely (was 3) Standing Ubsupported with Feet Together: Able to place feet together independently and stand 1 minute safely (4) From Standing, Reach Forward with Outstretched Arm: Can reach forward >12 cm safely (5") (was 2) From Standing Position, Pick up Object from Floor: Unable to pick up shoe, but reaches 2-5 cm (1-2") from shoe and balances independently (was 1) From Standing Position, Turn to Look Behind Over each Shoulder: Looks  behind one side only/other side shows less weight shift (was 2) Turn 360 Degrees: Able to turn 360 degrees safely but slowly (was 2) Standing Unsupported, Alternately Place Feet on Step/Stool: Able to complete 4 steps without aid or supervision (was 1) Standing Unsupported, One Foot in Front: Able to take small step independently and hold 30 seconds (was 0) Standing on One Leg: Tries to lift leg/unable to hold 3 seconds but remains standing independently (was 0) Total Score: 38  Stretches Active Hamstring Stretch: 3 reps;30 seconds;Limitations Active Hamstring Stretch Limitations: 14" on 2nd step Hip Flexor Stretch: 3 reps;30 seconds;Limitations Hip Flexor Stretch Limitations: 14in step Gastroc Stretch: 3 reps;30 seconds;Limitations Gastroc Stretch Limitations: slant board Aerobic Stationary Bike: Nustep hill 3, resistance 3 x 8' Standing Gait Training: 2D hip excursion with therapist facilitation to improve hip mobility with gait 2x 226 Other Standing Knee Exercises: 3D hip excursion with therapist facilitation to improve hip mobiltyu     Physical Therapy Assessment and Plan PT Assessment and Plan Clinical Impression Statement: Pt making good progress towards goals, BERG balance test complete with vast improvements of 10 points from 28 to 38.  Pt will continue to benefti from skilled internvention to ijmprove balance, strength and gait. PT Plan: Continue with current PT POC to improve mobiltiy, posture and strength to improve gait mechanics per POC.    Goals PT Short Term Goals PT Short Term Goal 1: Patient will display increased hamstring mobility for a 90/90 hamstring test to at least 80 degrees so she can more easily bend over to touch toes PT Short Term Goal 1 - Progress: Progressing toward goal PT Short Term Goal 2: Patient will display bilateral ankle dorsiflexion of  at least 5 degrees to be able to sit to stand from chair with improved mechanics/ease PT Short Term Goal 2 -  Progress: Progressing toward goal PT Short Term Goal 3: Patient will display Elys test of >120 degrees knee flexion to more easily perform sit to stand PT Short Term Goal 3 - Progress: Progressing toward goal PT Short Term Goal 4: Patient will demosntrate increased hip abduction/adduction bilateral to WNL PT Short Term Goal 4 - Progress: Progressing toward goal PT Short Term Goal 5: Patient will displays glut max/hamstring/hip abduction strength of >3/5 MMT bilateral so patient can perform sit to stand without UE support PT Short Term Goal 5 - Progress: Progressing toward goal PT Long Term Goals PT Long Term Goal 1: Patient will displays glut max strength of >3+/5 MMT bilateral so patient can perform 5x sit to stand without UE support in < 15 seconds PT Long Term Goal 1 - Progress: Progressing toward goal PT Long Term Goal 2: Patient will display hamstrng strength >4/5 MMT so patient can ambulate at > 1.2 m/s to rturn to community ambulatign status PT Long Term Goal 2 - Progress: Progressing toward goal Long Term Goal 3: Patient will display increased hip abduction strength of > 3+/5 so patient can perform single leg stand >3 seconds indicatign improved balance.  Long Term Goal 3 Progress: Progressing toward goal Long Term Goal 4: improve BERG balance scale to 52/56- new goal set 03/19/2014 Long Term Goal 4 Progress: Progressing toward goal  Problem List Patient Active Problem List   Diagnosis Date Noted  . Muscle weakness (generalized) 02/22/2014  . Stiffness of left knee 02/22/2014  . Hamstring tightness of left lower extremity 02/22/2014  . Pain in joint, pelvic region and thigh 02/22/2014  . Difficulty walking 04/07/2013  . Osteoporosis, unspecified 03/10/2013  . Anemia 03/10/2013  . Mild malnutrition 03/10/2013  . S/P left hip hemiarthroplasty 12/30/2012  . Osteoporosis with pathological fracture with delayed healing 11/25/2012  . Pedal edema 10/27/2012  . Fracture of sacrum  10/02/2012  . Stricture and stenosis of esophagus 09/24/2011  . HYPERLIPIDEMIA-MIXED 11/03/2008  . CAD, UNSPECIFIED SITE 11/03/2008    PT - End of Session Activity Tolerance: Patient tolerated treatment well General Behavior During Therapy: WFL for tasks assessed/performed  GP Functional Assessment Tool Used: FOTO 66% limited was 80% limited Functional Limitation: Changing and maintaining body position Changing and Maintaining Body Position Current Status (M6294): At least 60 percent but less than 80 percent impaired, limited or restricted Changing and Maintaining Body Position Goal Status (T6546): At least 60 percent but less than 80 percent impaired, limited or restricted  Aldona Lento 03/19/2014, 5:19 PM  Devona Konig PT DPT

## 2014-03-19 NOTE — Progress Notes (Signed)
Physical Therapy Treatment Patient Details  Name: Samantha Hudson MRN: [id number] Date of Birth: 05-Jun-1934  Today's Date: 03/19/2014 Time: 2353-6144 PT Time Calculation (min): 53 min Charge: TE 3154-0086, 7619-5093 Performance testing 1445-1500,   Visit#: 10 of 16  Re-eval: 03/24/14 Assessment Diagnosis: Rt hip hemi replacement resultign in difficlty walking, Lt hip stiffness and weakness in Lt LE.  Next MD Visit: rescheduled with Dr. Alvan Dame 12/3 Prior Therapy: Yes, SNF and HHPT in 2014  Authorization: [employer] Medicare  Authorization Time Period: Initial prescription for 9 visits but will likely take 8 weeks or longer to achieve all functional strength goals.   Authorization Visit#: 10 of 20   Subjective: Symptoms/Limitations Symptoms: Pain scale 5/10 Lt hip, has difficutly sleeping last night Pain Assessment Currently in Pain?: Yes Pain Score: 5  Pain Location: Hip Pain Orientation: Left  Precautions/Restrictions  Precautions Precautions: None  Exercise/Treatments Mobility/Balance     Berg Balance Test Sit to Stand: Able to stand using hands after several tries (was 1) Standing Unsupported: Able to stand 2 minutes with supervision (was 2) Sitting with Back Unsupported but Feet Supported on Floor or Stool: Able to sit safely and securely 2 minutes (was 4) Stand to Sit: Controls descent by using hands (was 3) Transfers: Able to transfer safely, definite need of hands (was 3) Standing Unsupported with Eyes Closed: Able to stand 10 seconds safely (was 3) Standing Ubsupported with Feet Together: Able to place feet together independently and stand 1 minute safely (4) From Standing, Reach Forward with Outstretched Arm: Can reach forward >12 cm safely (5") (was 2) From Standing Position, Pick up Object from Floor: Unable to pick up shoe, but reaches 2-5 cm (1-2") from shoe and balances independently (was 1) From Standing Position, Turn to Look Behind Over each Shoulder: Looks  behind one side only/other side shows less weight shift (was 2) Turn 360 Degrees: Able to turn 360 degrees safely but slowly (was 2) Standing Unsupported, Alternately Place Feet on Step/Stool: Able to complete 4 steps without aid or supervision (was 1) Standing Unsupported, One Foot in Front: Able to take small step independently and hold 30 seconds (was 0) Standing on One Leg: Tries to lift leg/unable to hold 3 seconds but remains standing independently (was 0) Total Score: 38  Stretches Active Hamstring Stretch: 3 reps;30 seconds;Limitations Active Hamstring Stretch Limitations: 14" on 2nd step Hip Flexor Stretch: 3 reps;30 seconds;Limitations Hip Flexor Stretch Limitations: 14in step Gastroc Stretch: 3 reps;30 seconds;Limitations Gastroc Stretch Limitations: slant board Aerobic Stationary Bike: Nustep hill 3, resistance 3 x 8' Standing Gait Training: 2D hip excursion with therapist facilitation to improve hip mobility with gait 2x 226 Other Standing Knee Exercises: 3D hip excursion with therapist facilitation to improve hip mobiltyu     Physical Therapy Assessment and Plan PT Assessment and Plan Clinical Impression Statement: Pt making good progress towards goals, BERG balance test complete with vast improvements of 10 points from 28 to 38.  Pt will continue to benefti from skilled internvention to ijmprove balance, strength and gait. PT Plan: Continue with current PT POC to improve mobiltiy, posture and strength to improve gait mechanics per POC.    Goals PT Short Term Goals PT Short Term Goal 1: Patient will display increased hamstring mobility for a 90/90 hamstring test to at least 80 degrees so she can more easily bend over to touch toes PT Short Term Goal 1 - Progress: Progressing toward goal PT Short Term Goal 2: Patient will display bilateral ankle dorsiflexion of  at least 5 degrees to be able to sit to stand from chair with improved mechanics/ease PT Short Term Goal 2 -  Progress: Progressing toward goal PT Short Term Goal 3: Patient will display Elys test of >120 degrees knee flexion to more easily perform sit to stand PT Short Term Goal 3 - Progress: Progressing toward goal PT Short Term Goal 4: Patient will demosntrate increased hip abduction/adduction bilateral to WNL PT Short Term Goal 4 - Progress: Progressing toward goal PT Short Term Goal 5: Patient will displays glut max/hamstring/hip abduction strength of >3/5 MMT bilateral so patient can perform sit to stand without UE support PT Short Term Goal 5 - Progress: Progressing toward goal PT Long Term Goals PT Long Term Goal 1: Patient will displays glut max strength of >3+/5 MMT bilateral so patient can perform 5x sit to stand without UE support in < 15 seconds PT Long Term Goal 1 - Progress: Progressing toward goal PT Long Term Goal 2: Patient will display hamstrng strength >4/5 MMT so patient can ambulate at > 1.2 m/s to rturn to community ambulatign status PT Long Term Goal 2 - Progress: Progressing toward goal Long Term Goal 3: Patient will display increased hip abduction strength of > 3+/5 so patient can perform single leg stand >3 seconds indicatign improved balance.  Long Term Goal 3 Progress: Progressing toward goal Long Term Goal 4: improve BERG balance scale to 52/56- new goal set 03/19/2014 Long Term Goal 4 Progress: Progressing toward goal  Problem List Patient Active Problem List   Diagnosis Date Noted  . Muscle weakness (generalized) 02/22/2014  . Stiffness of left knee 02/22/2014  . Hamstring tightness of left lower extremity 02/22/2014  . Pain in joint, pelvic region and thigh 02/22/2014  . Difficulty walking 04/07/2013  . Osteoporosis, unspecified 03/10/2013  . Anemia 03/10/2013  . Mild malnutrition 03/10/2013  . S/P left hip hemiarthroplasty 12/30/2012  . Osteoporosis with pathological fracture with delayed healing 11/25/2012  . Pedal edema 10/27/2012  . Fracture of sacrum  10/02/2012  . Stricture and stenosis of esophagus 09/24/2011  . HYPERLIPIDEMIA-MIXED 11/03/2008  . CAD, UNSPECIFIED SITE 11/03/2008    PT - End of Session Activity Tolerance: Patient tolerated treatment well General Behavior During Therapy: WFL for tasks assessed/performed  GP Functional Assessment Tool Used: FOTO 66% limited was 80% limited Functional Limitation: Changing and maintaining body position Changing and Maintaining Body Position Current Status (N6295): At least 60 percent but less than 80 percent impaired, limited or restricted Changing and Maintaining Body Position Goal Status (M8413): At least 60 percent but less than 80 percent impaired, limited or restricted  Aldona Lento 03/19/2014, 5:19 PM

## 2014-03-22 ENCOUNTER — Ambulatory Visit (HOSPITAL_COMMUNITY)
Admission: RE | Admit: 2014-03-22 | Discharge: 2014-03-22 | Disposition: A | Discharge status: Home / Self Care | Payer: Medicare Other | Source: Ambulatory Visit | Attending: Family Medicine | Admitting: Family Medicine

## 2014-03-22 ENCOUNTER — Encounter (HOSPITAL_COMMUNITY): Payer: Self-pay | Admitting: Physical Therapy

## 2014-03-22 DIAGNOSIS — R269 Unspecified abnormalities of gait and mobility: Secondary | ICD-10-CM | POA: Insufficient documentation

## 2014-03-22 DIAGNOSIS — M6281 Muscle weakness (generalized): Secondary | ICD-10-CM | POA: Diagnosis not present

## 2014-03-22 DIAGNOSIS — M25652 Stiffness of left hip, not elsewhere classified: Secondary | ICD-10-CM | POA: Insufficient documentation

## 2014-03-22 DIAGNOSIS — Z471 Aftercare following joint replacement surgery: Secondary | ICD-10-CM | POA: Insufficient documentation

## 2014-03-22 DIAGNOSIS — M25552 Pain in left hip: Secondary | ICD-10-CM | POA: Diagnosis not present

## 2014-03-22 DIAGNOSIS — M6289 Other specified disorders of muscle: Secondary | ICD-10-CM

## 2014-03-22 DIAGNOSIS — M25662 Stiffness of left knee, not elsewhere classified: Secondary | ICD-10-CM

## 2014-03-22 NOTE — Therapy (Signed)
Physical Therapy Treatment  Patient Details  Name: Samantha Hudson MRN: [id number] Date of Birth: [birth year]  Encounter Date: 03/22/2014      PT End of Session - 03/22/14 1450    Visit Number 11   Number of Visits 16   Date for PT Re-Evaluation 03/24/14   PT Start Time 5397   PT Stop Time 1440   PT Time Calculation (min) 48 min   Activity Tolerance Patient tolerated treatment well      Past Medical History  Diagnosis Date  . Hyperlipemia   . IBS (irritable bowel syndrome)   . Osteoporosis   . Fibromyalgia   . Glaucoma     HAD LASER SURGERY - NO EYE DROPS REQUIRED  . Globus hystericus   . Fibromyalgia   . Meningioma   . CAD (coronary artery disease)     HEART STENT PLACED ABOUT 2000- NO LONGER SEES CARDIOLOGIST; NO C/O OF CHEST PAINS OR SOB  . Heart murmur     MVP SINCE THE 60'S - TAKES ATENOLOL -  . Anxiety   . Swelling     BILATERAL FEET AND LEGS - ONSET OF SWELLING APRIL 2014  . GERD (gastroesophageal reflux disease)   . Arthritis   . Fracture   . CVA (cerebral vascular accident)     STROKE 7 YRS AGO - DAY AFTER BRAIN SURGERY TO REMOVE BENIGN MENINGIOMA - HAS LEFT SIDED WEAKNESS AND A LITTLE NUMBNESS  . Pain     "EXCRUCIATING PAIN" LEFT HIP - HAS A FRACTURE - IS CONFINED TO W/C AT HOME - NO WEIGHT BEARING LEFT HIP.    Past Surgical History  Procedure Laterality Date  . Brain surgery      tumor removed  . Tonsillectomy    . Partial hysterectomy    . Glaucoma repair    . Cataract extraction    . Kidney surgery      left - HX ENLARGED LEFT KIDNEY ON XRAY - SURGERY WAS DONE ON URETER   . Colonoscopy    . Coronary angioplasty    . Hip arthroplasty Left 12/29/2012    Procedure: LEFT HIP HEMI ARTHROPLASTY ;  Surgeon: Mauri Pole, MD;  Location: [facility];  Service: Orthopedics;  Laterality: Left;    There were no vitals taken for this visit.  Visit Diagnosis:  Muscle weakness (generalized)  Stiffness of left knee  Hamstring tightness of left lower  extremity  Pain in joint, pelvic region and thigh, left        Adult PT Treatment/Exercise - 03/22/14 0700    Ambulation/Gait Yes   Active Hamstring Stretch 3 reps;30 seconds;Limitations   Active Hamstring Stretch Limitations 14" on 2nd step   Hip Flexor Stretch 3 reps;30 seconds;Limitations   Hip Flexor Stretch Limitations 14in step   Gastroc Stretch 3 reps;30 seconds;Limitations   Gastroc Stretch Limitations slant board   Heel Raises 2 sets;10 reps;Limitations   Heel Raises Limitations heel and toe rasies   Forward Lunges 10 reps;Both   Functional Squat 10 reps;3 sets   Gait Training Walking drills 21ft each: high knees, heel raises, backwards walking,    Other Standing Knee Exercises 3D hip excursion with therapist facilitation to improve hip mobiltyu          Education - 03/22/14 1449    Education provided No          PT Short Term Goals - 03/22/14 1500    Title Patient will display increased hamstring mobility for a 90/90  hamstring test to at least 80 degrees so she can more easily bend over to touch toes   Status On-going   Title Patient will display bilateral ankle dorsiflexion of at least 5 degrees to be able to sit to stand from chair with improved mechanics/ease   Status On-going   Title Patient will display Elys test of >120 degrees knee flexion to more easily perform sit to stand   Status On-going   Title Patient will demosntrate increased hip abduction/adduction bilateral to WNL   Status On-going   Title Patient will displays glut max/hamstring/hip abduction strength of >3/5 MMT bilateral so patient can perform sit to stand without UE support   Status On-going          PT Long Term Goals - 2014-03-27 1501    Title Patient will displays glut max strength of >3+/5 MMT bilateral so patient can perform 5x sit to stand without UE support in < 15 seconds   Status On-going   Title Patient will display hamstrng strength >4/5 MMT so patient can ambulate at > 1.2  m/s to return to community ambulatign status   Status On-going   Title Patient will display increased hip abduction strength of > 3+/5 so patient can perform single leg stand >3 seconds indicating improved balance.     Status On-going   Title improve BERG balance scale to 52/56- new goal set 03/19/2014   Status On-going          Plan - 2014-03-27 1451    Clinical Impression Statement Focus of this session placed on improving gait by utilization  of walking drills as well as secondary focus on improving functional mobility with utilization of squatting. Patient performed squatign with notable increased shif tto the Lt LE but when performing split stance squat patient demonstrated increased depth  on Rt vs Lt . Limited squat depth on Lt attributed to weakness with shift to Lt LE durning parallel squatting attributed to limited Rt hip mobility.    PT Plan Continue with current PT POC to improve mobiltiy, posture and strength to improve gait mechanics to increase efficiency of gait. Conitnue secondary focus on improving squat depth and strength for improved mobility          G-Codes - 03-27-14 1504    Functional Assessment Tool Used FOTO 66% limited was 80% limited   Functional Limitation Changing and maintaining body position   Changing and Maintaining Body Position Current Status (X4128) At least 60 percent but less than 80 percent impaired, limited or restricted   Changing and Maintaining Body Position Goal Status (N8676) At least 60 percent but less than 80 percent impaired, limited or restricted      Problem List Patient Active Problem List   Diagnosis Date Noted  . Muscle weakness (generalized) 02/22/2014  . Stiffness of left knee 02/22/2014  . Hamstring tightness of left lower extremity 02/22/2014  . Pain in joint, pelvic region and thigh 02/22/2014  . Difficulty walking 04/07/2013  . Osteoporosis, unspecified 03/10/2013  . Anemia 03/10/2013  . Mild malnutrition 03/10/2013  .  S/P left hip hemiarthroplasty 12/30/2012  . Osteoporosis with pathological fracture with delayed healing 11/25/2012  . Pedal edema 10/27/2012  . Fracture of sacrum 10/02/2012  . Stricture and stenosis of esophagus 09/24/2011  . HYPERLIPIDEMIA-MIXED 11/03/2008  . CAD, UNSPECIFIED SITE 11/03/2008    Aditya Nastasi R 2014-03-27, 3:07 PM

## 2014-03-24 ENCOUNTER — Ambulatory Visit (HOSPITAL_COMMUNITY)
Admission: RE | Admit: 2014-03-24 | Discharge: 2014-03-24 | Disposition: A | Discharge status: Home / Self Care | Payer: Medicare Other | Source: Ambulatory Visit | Attending: Orthopedic Surgery | Admitting: Orthopedic Surgery

## 2014-03-24 DIAGNOSIS — Z471 Aftercare following joint replacement surgery: Secondary | ICD-10-CM | POA: Diagnosis not present

## 2014-03-24 DIAGNOSIS — M6281 Muscle weakness (generalized): Secondary | ICD-10-CM

## 2014-03-24 DIAGNOSIS — M6289 Other specified disorders of muscle: Secondary | ICD-10-CM

## 2014-03-24 DIAGNOSIS — M25662 Stiffness of left knee, not elsewhere classified: Secondary | ICD-10-CM

## 2014-03-24 NOTE — Therapy (Signed)
Physical Therapy Treatment  Patient Details  Name: Samantha Hudson MRN: [id number] Date of Birth: 01/25/1935  Encounter Date: 03/24/2014      PT End of Session - 03/24/14 1435    Visit Number 12   Number of Visits 16   Date for PT Re-Evaluation 03/24/14   Authorization Type medicare   Authorization - Visit Number 12   Authorization - Number of Visits 16   PT Start Time 6720   PT Stop Time 9470   PT Time Calculation (min) 50 min   Activity Tolerance Patient tolerated treatment well      Past Medical History  Diagnosis Date  . Hyperlipemia   . IBS (irritable bowel syndrome)   . Osteoporosis   . Fibromyalgia   . Glaucoma     HAD LASER SURGERY - NO EYE DROPS REQUIRED  . Globus hystericus   . Fibromyalgia   . Meningioma   . CAD (coronary artery disease)     HEART STENT PLACED ABOUT 2000- NO LONGER SEES CARDIOLOGIST; NO C/O OF CHEST PAINS OR SOB  . Heart murmur     MVP SINCE THE 60'S - TAKES ATENOLOL -  . Anxiety   . Swelling     BILATERAL FEET AND LEGS - ONSET OF SWELLING APRIL 2014  . GERD (gastroesophageal reflux disease)   . Arthritis   . Fracture   . CVA (cerebral vascular accident)     STROKE 7 YRS AGO - DAY AFTER BRAIN SURGERY TO REMOVE BENIGN MENINGIOMA - HAS LEFT SIDED WEAKNESS AND A LITTLE NUMBNESS  . Pain     "EXCRUCIATING PAIN" LEFT HIP - HAS A FRACTURE - IS CONFINED TO W/C AT HOME - NO WEIGHT BEARING LEFT HIP.    Past Surgical History  Procedure Laterality Date  . Brain surgery      tumor removed  . Tonsillectomy    . Partial hysterectomy    . Glaucoma repair    . Cataract extraction    . Kidney surgery      left - HX ENLARGED LEFT KIDNEY ON XRAY - SURGERY WAS DONE ON URETER   . Colonoscopy    . Coronary angioplasty    . Hip arthroplasty Left 12/29/2012    Procedure: LEFT HIP HEMI ARTHROPLASTY ;  Surgeon: Mauri Pole, MD;  Location: [facility];  Service: Orthopedics;  Laterality: Left;    There were no vitals taken for this visit.  Visit  Diagnosis:  Muscle weakness (generalized)  Stiffness of left knee  Hamstring tightness of left lower extremity          Adult PT Treatment/Exercise - 03/24/14 0700    Knee/Hip Exercises: Stretches   Active Hamstring Stretch 3 reps;30 seconds;Limitations   Active Hamstring Stretch Limitations 14" on 2nd step   Hip Flexor Stretch 3 reps;30 seconds;Limitations   Hip Flexor Stretch Limitations 14in step   Gastroc Stretch 3 reps;30 seconds;Limitations   Gastroc Stretch Limitations slant board   Knee/Hip Exercises: Standing   Heel Raises 20 reps   Heel Raises Limitations heel and toe rasies   Forward Lunges 10 reps;Both   Functional Squat 10 reps;3 sets   Gait Training Walking drills 43ft each: high knees, toes up, backwards walking all with rollator                 Plan - 03/24/14 1436    Clinical Impression Statement Continued to focus on improving gait quality LE flexibility.  Pt able to complete all walking drills successfully  using rollator and squating using bilateral UE's.  Improved squat depth and equality noted with activity today.   PT Plan Continue with current PT POC to improve mobiltiy, posture and strength to improve gait mechanics to increase efficiency of gait. Conitnue secondary focus on improving squat depth and strength for improved mobility.  Attempt walking drills using decreased AD and squats with 1 HHA next visit.          Problem List Patient Active Problem List   Diagnosis Date Noted  . Muscle weakness (generalized) 02/22/2014  . Stiffness of left knee 02/22/2014  . Hamstring tightness of left lower extremity 02/22/2014  . Pain in joint, pelvic region and thigh 02/22/2014  . Difficulty walking 04/07/2013  . Osteoporosis, unspecified 03/10/2013  . Anemia 03/10/2013  . Mild malnutrition 03/10/2013  . S/P left hip hemiarthroplasty 12/30/2012  . Osteoporosis with pathological fracture with delayed healing 11/25/2012  . Pedal edema 10/27/2012   . Fracture of sacrum 10/02/2012  . Stricture and stenosis of esophagus 09/24/2011  . HYPERLIPIDEMIA-MIXED 11/03/2008  . CAD, UNSPECIFIED SITE 11/03/2008     Teena Irani, PTA/CLT 03/24/2014, 3:16 PM

## 2014-03-26 ENCOUNTER — Ambulatory Visit (HOSPITAL_COMMUNITY)
Admission: RE | Admit: 2014-03-26 | Discharge: 2014-03-26 | Disposition: A | Discharge status: Home / Self Care | Payer: Medicare Other | Source: Ambulatory Visit | Attending: Orthopedic Surgery | Admitting: Orthopedic Surgery

## 2014-03-26 DIAGNOSIS — M25552 Pain in left hip: Secondary | ICD-10-CM

## 2014-03-26 DIAGNOSIS — M6281 Muscle weakness (generalized): Secondary | ICD-10-CM

## 2014-03-26 DIAGNOSIS — M25662 Stiffness of left knee, not elsewhere classified: Secondary | ICD-10-CM

## 2014-03-26 DIAGNOSIS — Z471 Aftercare following joint replacement surgery: Secondary | ICD-10-CM | POA: Diagnosis not present

## 2014-03-26 DIAGNOSIS — M6289 Other specified disorders of muscle: Secondary | ICD-10-CM

## 2014-03-26 NOTE — Therapy (Signed)
Physical Therapy Treatment  Patient Details  Name: Samantha Hudson MRN: [id number] Date of Birth: 01-Mar-1935  Encounter Date: 03/26/2014      PT End of Session - 03/26/14 1524    Visit Number 13   Number of Visits 18   Date for PT Re-Evaluation 04/16/14   Authorization Type medicare   Authorization - Visit Number 13   Authorization - Number of Visits 18   PT Start Time 6767   PT Stop Time 1524   PT Time Calculation (min) 47 min   PT Start Time 2094   PT Stop Time 1524   PT Time Calculation (min) 47 min   PT Charge Details TE [id number], Gait [id number]   Equipment Utilized During Treatment Gait belt   Activity Tolerance Patient tolerated treatment well      Past Medical History  Diagnosis Date  . Hyperlipemia   . IBS (irritable bowel syndrome)   . Osteoporosis   . Fibromyalgia   . Glaucoma     HAD LASER SURGERY - NO EYE DROPS REQUIRED  . Globus hystericus   . Fibromyalgia   . Meningioma   . CAD (coronary artery disease)     HEART STENT PLACED ABOUT 2000- NO LONGER SEES CARDIOLOGIST; NO C/O OF CHEST PAINS OR SOB  . Heart murmur     MVP SINCE THE 60'S - TAKES ATENOLOL -  . Anxiety   . Swelling     BILATERAL FEET AND LEGS - ONSET OF SWELLING APRIL 2014  . GERD (gastroesophageal reflux disease)   . Arthritis   . Fracture   . CVA (cerebral vascular accident)     STROKE 7 YRS AGO - DAY AFTER BRAIN SURGERY TO REMOVE BENIGN MENINGIOMA - HAS LEFT SIDED WEAKNESS AND A LITTLE NUMBNESS  . Pain     "EXCRUCIATING PAIN" LEFT HIP - HAS A FRACTURE - IS CONFINED TO W/C AT HOME - NO WEIGHT BEARING LEFT HIP.    Past Surgical History  Procedure Laterality Date  . Brain surgery      tumor removed  . Tonsillectomy    . Partial hysterectomy    . Glaucoma repair    . Cataract extraction    . Kidney surgery      left - HX ENLARGED LEFT KIDNEY ON XRAY - SURGERY WAS DONE ON URETER   . Colonoscopy    . Coronary angioplasty    . Hip arthroplasty Left 12/29/2012    Procedure: LEFT  HIP HEMI ARTHROPLASTY ;  Surgeon: Mauri Pole, MD;  Location: [facility];  Service: Orthopedics;  Laterality: Left;    There were no vitals taken for this visit.  Visit Diagnosis:  Muscle weakness (generalized)  Stiffness of left knee  Hamstring tightness of left lower extremity  Pain in joint, pelvic region and thigh, left  Subjective: Pt stated she is feeling better today, stated she feels a 50% better than day 1.  Stretches are very helpful, complaint with HEP daily      [facility] Adult PT Treatment/Exercise - 03/26/14 1906    Knee/Hip Exercises: Stretches   Active Hamstring Stretch 3 reps;30 seconds;Limitations   Active Hamstring Stretch Limitations 14" step   Quad Stretch 2 reps;30 seconds;Limitations   Quad Stretch Limitations quadruped to prone with pillows under hips with rope   Hip Flexor Stretch 3 reps;30 seconds;Limitations   Hip Flexor Stretch Limitations 14in step   Gastroc Stretch 3 reps;30 seconds;Limitations   Gastroc Stretch Limitations slant board   Knee/Hip Exercises: Standing  Heel Raises 20 reps   Heel Raises Limitations heel and toe rasies   Functional Squat 2 sets;10 reps   Gait Training Gait training with SPC x 226   Other Standing Knee Exercises 3D hip excursion with therapist facilitation to improve hip mobiltyu   Other Standing Knee Exercises 2D hip excursion with RW and therapist facilitation; standing scapular retraciotn 10x          Education - 03/26/14 1909    Education provided Yes   Education Details Sequencing with [facility] and reviewed importance of posture   Methods Explanation;Demonstration;Tactile cues   Comprehension Verbalized understanding;Returned demonstration          PT Short Term Goals - 03/26/14 1459    PT SHORT TERM GOAL #1   Title Patient will display increased hamstring mobility for a 90/90 hamstring test to at least 80 degrees so she can more easily bend over to touch toes   Status Not Met   PT SHORT TERM GOAL #2   Title  Patient will display bilateral ankle dorsiflexion of at least 5 degrees to be able to sit to stand from chair with improved mechanics/ease   Status On-going   PT SHORT TERM GOAL #3   Title Patient will display Elys test of >120 degrees knee flexion to more easily perform sit to stand   Status On-going   PT SHORT TERM GOAL #4   Title Patient will demosntrate increased hip abduction/adduction bilateral to WNL   Status On-going   PT SHORT TERM GOAL #5   Title Patient will displays glut max/hamstring/hip abduction strength of >3/5 MMT bilateral so patient can perform sit to stand without UE support   Status On-going          PT Long Term Goals - 03/26/14 1920    PT LONG TERM GOAL #1   Title Patient will displays glut max strength of >3+/5 MMT bilateral so patient can perform 5x sit to stand without UE support in < 15 seconds   Status On-going   PT LONG TERM GOAL #2   Title Patient will display hamstrng strength >4/5 MMT so patient can ambulate at > 1.2 m/s to return to community ambulatign status   Status On-going   PT LONG TERM GOAL #3   Title Patient will display increased hip abduction strength of > 3+/5 so patient can perform single leg stand >3 seconds indicating improved balance.     PT LONG TERM GOAL #4   Title improve BERG balance scale to 52/56- new goal set 03/19/2014   Status On-going          Plan - 03/26/14 1916    Clinical Impression Statement Continued session focus on improving hip mobilty to improve gait quality with increased LE flexibilty.  Gait training complete this session with SPC with SBA and min cueing for proper sequencing and posture.  Able to increase squat depth with therapist facilitation for proper weight distribution for gluteal strengthening.  No reports ofi ncreased pain through session.     PT Plan Continue with current PT POC to improve mobiltiy, posture and strength to improve gait mechanics to increase efficiency of gait. Conitnue secondary focus  on improving squat depth and strength for improved mobility.  Attempt walking drills using decreased AD and squats with 1 HHA next visit.          Problem List Patient Active Problem List   Diagnosis Date Noted  . Muscle weakness (generalized) 02/22/2014  . Stiffness of left  knee 02/22/2014  . Hamstring tightness of left lower extremity 02/22/2014  . Pain in joint, pelvic region and thigh 02/22/2014  . Difficulty walking 04/07/2013  . Osteoporosis, unspecified 03/10/2013  . Anemia 03/10/2013  . Mild malnutrition 03/10/2013  . S/P left hip hemiarthroplasty 12/30/2012  . Osteoporosis with pathological fracture with delayed healing 11/25/2012  . Pedal edema 10/27/2012  . Fracture of sacrum 10/02/2012  . Stricture and stenosis of esophagus 09/24/2011  . HYPERLIPIDEMIA-MIXED 11/03/2008  . CAD, UNSPECIFIED SITE 11/03/2008     Aldona Lento, PTA Aldona Lento 03/26/2014, 7:24 PM

## 2014-03-29 ENCOUNTER — Ambulatory Visit (HOSPITAL_COMMUNITY)
Admission: RE | Admit: 2014-03-29 | Discharge: 2014-03-29 | Disposition: A | Discharge status: Home / Self Care | Payer: Medicare Other | Source: Ambulatory Visit | Attending: Orthopedic Surgery | Admitting: Orthopedic Surgery

## 2014-03-29 ENCOUNTER — Ambulatory Visit (HOSPITAL_COMMUNITY): Payer: BC Managed Care – PPO | Admitting: Physical Therapy

## 2014-03-29 DIAGNOSIS — M6281 Muscle weakness (generalized): Secondary | ICD-10-CM

## 2014-03-29 DIAGNOSIS — Z471 Aftercare following joint replacement surgery: Secondary | ICD-10-CM | POA: Diagnosis not present

## 2014-03-29 DIAGNOSIS — M25552 Pain in left hip: Secondary | ICD-10-CM

## 2014-03-29 DIAGNOSIS — M6289 Other specified disorders of muscle: Secondary | ICD-10-CM

## 2014-03-29 DIAGNOSIS — M25662 Stiffness of left knee, not elsewhere classified: Secondary | ICD-10-CM

## 2014-03-29 NOTE — Therapy (Signed)
Physical Therapy Treatment  Patient Details  Name: Samantha Hudson MRN: [id number] Date of Birth: [birth year]  Encounter Date: 03/29/2014      PT End of Session - 03/29/14 1617    Visit Number 14   Number of Visits 18   Date for PT Re-Evaluation 04/16/14   Authorization Type medicare   Authorization - Visit Number 14   Authorization - Number of Visits 18   PT Start Time 1522   PT Stop Time 1607   PT Time Calculation (min) 45 min   Equipment Utilized During Treatment Gait belt   Activity Tolerance Patient tolerated treatment well      Past Medical History  Diagnosis Date  . Hyperlipemia   . IBS (irritable bowel syndrome)   . Osteoporosis   . Fibromyalgia   . Glaucoma     HAD LASER SURGERY - NO EYE DROPS REQUIRED  . Globus hystericus   . Fibromyalgia   . Meningioma   . CAD (coronary artery disease)     HEART STENT PLACED ABOUT 2000- NO LONGER SEES CARDIOLOGIST; NO C/O OF CHEST PAINS OR SOB  . Heart murmur     MVP SINCE THE 60'S - TAKES ATENOLOL -  . Anxiety   . Swelling     BILATERAL FEET AND LEGS - ONSET OF SWELLING APRIL 2014  . GERD (gastroesophageal reflux disease)   . Arthritis   . Fracture   . CVA (cerebral vascular accident)     STROKE 7 YRS AGO - DAY AFTER BRAIN SURGERY TO REMOVE BENIGN MENINGIOMA - HAS LEFT SIDED WEAKNESS AND A LITTLE NUMBNESS  . Pain     "EXCRUCIATING PAIN" LEFT HIP - HAS A FRACTURE - IS CONFINED TO W/C AT HOME - NO WEIGHT BEARING LEFT HIP.    Past Surgical History  Procedure Laterality Date  . Brain surgery      tumor removed  . Tonsillectomy    . Partial hysterectomy    . Glaucoma repair    . Cataract extraction    . Kidney surgery      left - HX ENLARGED LEFT KIDNEY ON XRAY - SURGERY WAS DONE ON URETER   . Colonoscopy    . Coronary angioplasty    . Hip arthroplasty Left 12/29/2012    Procedure: LEFT HIP HEMI ARTHROPLASTY ;  Surgeon: Mauri Pole, MD;  Location: [facility];  Service: Orthopedics;  Laterality: Left;    There were  no vitals taken for this visit.  Visit Diagnosis:  No diagnosis found.  Subjective:  Pt reports no pain or difficulties today.        [facility] Adult PT Treatment/Exercise - 03/29/14 1559    Knee/Hip Exercises: Stretches   Active Hamstring Stretch 3 reps;30 seconds;Limitations   Active Hamstring Stretch Limitations 14" step   Hip Flexor Stretch 3 reps;30 seconds;Limitations   Hip Flexor Stretch Limitations 14in step   Gastroc Stretch 3 reps;30 seconds;Limitations   Gastroc Stretch Limitations slant board   Knee/Hip Exercises: Standing   Heel Raises 20 reps   Heel Raises Limitations heel and toe rasies   Forward Lunges 10 reps;Both   Functional Squat 2 sets;10 reps             Plan - 03/29/14 1618    Clinical Impression Statement Continued session focus on improving hip mobilty to improve gait quality with increased LE flexibilty. Improving confidence and stability using SPC with gait. Able to complete forward lunges and squats today with SPC only, away from  parallel bars.  Continues to require manual cues for form with hamstring and hip flexor stretches.     PT Plan Continue with current PT POC to improve mobiltiy, posture and strength to improve gait mechanics to increase efficiency of gait. Continue secondary focus on improving squat depth and strength for improved mobility.  Re-evaluate X 4 more visits.        Problem List Patient Active Problem List   Diagnosis Date Noted  . Muscle weakness (generalized) 02/22/2014  . Stiffness of left knee 02/22/2014  . Hamstring tightness of left lower extremity 02/22/2014  . Pain in joint, pelvic region and thigh 02/22/2014  . Difficulty walking 04/07/2013  . Osteoporosis, unspecified 03/10/2013  . Anemia 03/10/2013  . Mild malnutrition 03/10/2013  . S/P left hip hemiarthroplasty 12/30/2012  . Osteoporosis with pathological fracture with delayed healing 11/25/2012  . Pedal edema 10/27/2012  . Fracture of sacrum 10/02/2012  .  Stricture and stenosis of esophagus 09/24/2011  . HYPERLIPIDEMIA-MIXED 11/03/2008  . CAD, UNSPECIFIED SITE 11/03/2008     Teena Irani, PTA/CLT 03/29/2014, 4:23 PM

## 2014-03-30 ENCOUNTER — Encounter: Payer: Self-pay | Admitting: Family Medicine

## 2014-03-30 ENCOUNTER — Ambulatory Visit (INDEPENDENT_AMBULATORY_CARE_PROVIDER_SITE_OTHER): Payer: Medicare Other | Admitting: Family Medicine

## 2014-03-30 VITALS — BP 118/74 | Wt 99.0 lb

## 2014-03-30 DIAGNOSIS — Z79899 Other long term (current) drug therapy: Secondary | ICD-10-CM

## 2014-03-30 DIAGNOSIS — R600 Localized edema: Secondary | ICD-10-CM

## 2014-03-30 DIAGNOSIS — M810 Age-related osteoporosis without current pathological fracture: Secondary | ICD-10-CM

## 2014-03-30 MED ORDER — GABAPENTIN 400 MG PO CAPS: 400.0000 mg | ORAL_CAPSULE | Freq: Two times a day (BID) | ORAL | Status: DC

## 2014-03-30 MED ORDER — POTASSIUM CHLORIDE ER 10 MEQ PO TBCR: EXTENDED_RELEASE_TABLET | ORAL | Status: DC

## 2014-03-30 MED ORDER — ASPIRIN-DIPYRIDAMOLE ER 25-200 MG PO CP12: ORAL_CAPSULE | ORAL | Status: DC

## 2014-03-30 MED ORDER — TORSEMIDE 20 MG PO TABS: ORAL_TABLET | ORAL | Status: DC

## 2014-03-30 MED ORDER — GABAPENTIN 100 MG PO CAPS: ORAL_CAPSULE | ORAL | Status: DC

## 2014-03-30 MED ORDER — ATENOLOL 25 MG PO TABS: ORAL_TABLET | ORAL | Status: DC

## 2014-03-30 MED ORDER — DULOXETINE HCL 30 MG PO CPEP: ORAL_CAPSULE | ORAL | Status: DC

## 2014-03-30 MED ORDER — RANITIDINE HCL 300 MG PO TABS: ORAL_TABLET | ORAL | Status: DC

## 2014-03-30 NOTE — Progress Notes (Signed)
   Subjective:    Patient ID: Samantha Hudson, female    DOB: 1934-11-19, 79 y.o.   MRN: [id number]  Hyperlipidemia This is a chronic problem. The current episode started more than 1 year ago. There are no compliance problems.   Needs order for reclast to [facility] at [facility].  Will get flu vaccine at drug store.  Eats healthy. Eats a lot of protein.  Pt states pt is going well. Going for hip, leg pain and stiffness. Working on balance and strength.  Long discussion held with patient regarding her neuropathy is stable with the gabapentin also her edema in the legs been stable with the Demadex all TRAM is helping with her pain. Left shoulder pain. Started a few months ago. Pt thinks its where she presses down on the walker. Taking tramadol and biofreeze.   Review of Systems She denies chest tightness pressure pain shortness past no recent blood in her urine no bloody bowel movements appetite okay    Objective:   Physical Exam Neck no masses lungs are clear no crackles heart is regular pulse normal blood pressure with the small cuff good extremities no edema skin warm dry mild stiffness noted in the left shoulder but has fairly good range of motion compared to the other side       Assessment & Plan:  [facility]-they are doing the appropriate tests he is RD had a CAT scan she is having cystoscope in the near future hopefully they'll send [place] copies of this  Left shoulder pain I believe this is related to her having a depending on the morning help herself get up stretching exercises, range of motion exercises can help  osteoporosis-sset her up for Reclast this is a 1 spray year-end injection for her osteoporosis.  Continuous diuretic use check metabolic 7  Dietary measures were discussed she is holding her weight  Other health issues are stablefollow-up again in approximately 4 months 25 minutes spent with patient

## 2014-03-31 ENCOUNTER — Ambulatory Visit (HOSPITAL_COMMUNITY)
Admission: RE | Admit: 2014-03-31 | Discharge: 2014-03-31 | Disposition: A | Discharge status: Home / Self Care | Payer: Medicare Other | Source: Ambulatory Visit | Attending: Orthopedic Surgery | Admitting: Orthopedic Surgery

## 2014-03-31 DIAGNOSIS — M25662 Stiffness of left knee, not elsewhere classified: Secondary | ICD-10-CM

## 2014-03-31 DIAGNOSIS — Z471 Aftercare following joint replacement surgery: Secondary | ICD-10-CM | POA: Diagnosis not present

## 2014-03-31 DIAGNOSIS — M25552 Pain in left hip: Secondary | ICD-10-CM

## 2014-03-31 DIAGNOSIS — M6281 Muscle weakness (generalized): Secondary | ICD-10-CM

## 2014-03-31 DIAGNOSIS — M6289 Other specified disorders of muscle: Secondary | ICD-10-CM

## 2014-03-31 NOTE — Therapy (Signed)
Physical Therapy Treatment  Patient Details  Name: Samantha Hudson MRN: [id number] Date of Birth: August 05, 1934  Encounter Date: 03/31/2014      PT End of Session - 03/31/14 1526    Visit Number 16   Number of Visits 18   Date for PT Re-Evaluation 04/16/14   Authorization Type medicare   Authorization - Visit Number 16   Authorization - Number of Visits 18   PT Start Time 9417   PT Stop Time 1520   PT Time Calculation (min) 48 min   PT Charge Details TE [id number]   Equipment Utilized During Treatment Gait belt   Activity Tolerance Patient tolerated treatment well   Behavior During Therapy WFL for tasks assessed/performed      Past Medical History  Diagnosis Date  . Hyperlipemia   . IBS (irritable bowel syndrome)   . Osteoporosis   . Fibromyalgia   . Glaucoma     HAD LASER SURGERY - NO EYE DROPS REQUIRED  . Globus hystericus   . Fibromyalgia   . Meningioma   . CAD (coronary artery disease)     HEART STENT PLACED ABOUT 2000- NO LONGER SEES CARDIOLOGIST; NO C/O OF CHEST PAINS OR SOB  . Heart murmur     MVP SINCE THE 60'S - TAKES ATENOLOL -  . Anxiety   . Swelling     BILATERAL FEET AND LEGS - ONSET OF SWELLING APRIL 2014  . GERD (gastroesophageal reflux disease)   . Arthritis   . Fracture   . CVA (cerebral vascular accident)     STROKE 7 YRS AGO - DAY AFTER BRAIN SURGERY TO REMOVE BENIGN MENINGIOMA - HAS LEFT SIDED WEAKNESS AND A LITTLE NUMBNESS  . Pain     "EXCRUCIATING PAIN" LEFT HIP - HAS A FRACTURE - IS CONFINED TO W/C AT HOME - NO WEIGHT BEARING LEFT HIP.    Past Surgical History  Procedure Laterality Date  . Brain surgery      tumor removed  . Tonsillectomy    . Partial hysterectomy    . Glaucoma repair    . Cataract extraction    . Kidney surgery      left - HX ENLARGED LEFT KIDNEY ON XRAY - SURGERY WAS DONE ON URETER   . Colonoscopy    . Coronary angioplasty    . Hip arthroplasty Left 12/29/2012    Procedure: LEFT HIP HEMI ARTHROPLASTY ;  Surgeon:  Mauri Pole, MD;  Location: [facility];  Service: Orthopedics;  Laterality: Left;    There were no vitals taken for this visit.  Visit Diagnosis:  Muscle weakness (generalized)  Stiffness of left knee  Hamstring tightness of left lower extremity  Pain in joint, pelvic region and thigh, left      Subjective Assessment - 03/31/14 1441    Symptoms Pain free today, feels her balance and walking are a little more difficult today.   Currently in Pain? No/denies            [facility] Adult PT Treatment/Exercise - 03/31/14 1637    Lumbar Exercises: Stretches   Active Hamstring Stretch 3 reps;30 seconds;Limitations   Active Hamstring Stretch Limitations 14" step   Knee/Hip Exercises: Stretches   Hip Flexor Stretch 3 reps;30 seconds;Limitations   Hip Flexor Stretch Limitations 14in step   Gastroc Stretch 3 reps;30 seconds;Limitations   Gastroc Stretch Limitations slant board   Knee/Hip Exercises: Standing   Heel Raises 20 reps   Heel Raises Limitations heel and toe rasies  Knee Flexion Both;10 reps   Functional Squat 2 sets;10 reps   Gait Training Gait training with SPC trough session   Other Standing Knee Exercises 3D hip excursion with therapist facilitation to improve hip mobiltyu   Other Standing Knee Exercises 8 STS min assistnace max cueing for nose over toes to prevent posterior lean with stannding 5 reps with aitex   Knee/Hip Exercises: Seated   Other Seated Knee Exercises 3D thoracic excursion 10x each with Bil LE to improve extension; Scooting forward and backwards            PT Short Term Goals - 03/31/14 1634    PT SHORT TERM GOAL #1   Title Patient will display increased hamstring mobility for a 90/90 hamstring test to at least 80 degrees so she can more easily bend over to touch toes   Status On-going   PT SHORT TERM GOAL #2   Title Patient will display bilateral ankle dorsiflexion of at least 5 degrees to be able to sit to stand from chair with improved  mechanics/ease   Status On-going   PT SHORT TERM GOAL #3   Title Patient will display Elys test of >120 degrees knee flexion to more easily perform sit to stand   Status On-going   PT SHORT TERM GOAL #4   Title Patient will demosntrate increased hip abduction/adduction bilateral to WNL   Status On-going   PT SHORT TERM GOAL #5   Title Patient will displays glut max/hamstring/hip abduction strength of >3/5 MMT bilateral so patient can perform sit to stand without UE support   Status On-going          PT Long Term Goals - 03/31/14 1634    PT LONG TERM GOAL #1   Title Patient will displays glut max strength of >3+/5 MMT bilateral so patient can perform 5x sit to stand without UE support in < 15 seconds   PT LONG TERM GOAL #2   Title Patient will display hamstrng strength >4/5 MMT so patient can ambulate at > 1.2 m/s to return to community ambulatign status   PT LONG TERM GOAL #3   Title Patient will display increased hip abduction strength of > 3+/5 so patient can perform single leg stand >3 seconds indicating improved balance.     PT LONG TERM GOAL #4   Title improve BERG balance scale to 52/56- new goal set 03/19/2014          Plan - 03/31/14 1623    Clinical Impression Statement Continued session focus on improving hip mobilty to improve gait mechanics with SPC.  Pt able to demonstrate good mechqnics with SPC with SBA with cueing for posture and to increase Rt step length.  Continued thoracic mobility exercises to improve posture.  Added scooting and sit to stand exercises to improve  sit to stand with less assistance required, pt with mod difficuty hip hiking and weak core musculature with assistance required to scoot forward.  Added airex with sit to stands wtih cueing and mod assistance required to reduce posterior lean upon standing.  No reorts of increased pain through session.     PT Plan Continue with current PT POC to improve mobiltiy, posture and strength to improve gait  mechanics to increase efficiency of gait. Continue secondary focus on improving squat depth and strength for improved mobility.  Attempt walking drills using decreased AD and squats with 1 HHA next visit.  Begin standing high marches with 1 HHA to improve balance, posture and  core strengthening next session to assist wtih scooting abilities without HHA.        Problem List Patient Active Problem List   Diagnosis Date Noted  . Muscle weakness (generalized) 02/22/2014  . Stiffness of left knee 02/22/2014  . Hamstring tightness of left lower extremity 02/22/2014  . Pain in joint, pelvic region and thigh 02/22/2014  . Difficulty walking 04/07/2013  . Osteoporosis, unspecified 03/10/2013  . Anemia 03/10/2013  . Mild malnutrition 03/10/2013  . S/P left hip hemiarthroplasty 12/30/2012  . Osteoporosis with pathological fracture with delayed healing 11/25/2012  . Pedal edema 10/27/2012  . Fracture of sacrum 10/02/2012  . Stricture and stenosis of esophagus 09/24/2011  . HYPERLIPIDEMIA-MIXED 11/03/2008  . CAD, UNSPECIFIED SITE 11/03/2008    Aldona Lento, PTA Aldona Lento 03/31/2014, 4:38 PM

## 2014-04-02 ENCOUNTER — Ambulatory Visit (HOSPITAL_COMMUNITY)
Admission: RE | Admit: 2014-04-02 | Discharge: 2014-04-02 | Disposition: A | Discharge status: Home / Self Care | Payer: Medicare Other | Source: Ambulatory Visit | Attending: Orthopedic Surgery | Admitting: Orthopedic Surgery

## 2014-04-02 DIAGNOSIS — M6289 Other specified disorders of muscle: Secondary | ICD-10-CM

## 2014-04-02 DIAGNOSIS — M25552 Pain in left hip: Secondary | ICD-10-CM

## 2014-04-02 DIAGNOSIS — Z471 Aftercare following joint replacement surgery: Secondary | ICD-10-CM | POA: Diagnosis not present

## 2014-04-02 DIAGNOSIS — M6281 Muscle weakness (generalized): Secondary | ICD-10-CM

## 2014-04-02 DIAGNOSIS — M25662 Stiffness of left knee, not elsewhere classified: Secondary | ICD-10-CM

## 2014-04-02 NOTE — Therapy (Signed)
Physical Therapy Treatment  Patient Details  Name: Samantha Hudson MRN: [id number] Date of Birth: 1935/04/08  Encounter Date: 04/02/2014      PT End of Session - 04/02/14 1907    Visit Number 16   Number of Visits 18   Date for PT Re-Evaluation 04/16/14   Authorization Type medicare   Authorization Time Period Re-eval complete on 10/26 (part 1) and 10/30   Authorization - Visit Number 16   Authorization - Number of Visits 18   PT Start Time 3762   PT Stop Time 1518   PT Time Calculation (min) 58 min   PT Charge Details NMR 1420-1445, TE [id number]   Equipment Utilized During Treatment Gait belt   Activity Tolerance Patient tolerated treatment well   Behavior During Therapy WFL for tasks assessed/performed      Past Medical History  Diagnosis Date  . Hyperlipemia   . IBS (irritable bowel syndrome)   . Osteoporosis   . Fibromyalgia   . Glaucoma     HAD LASER SURGERY - NO EYE DROPS REQUIRED  . Globus hystericus   . Fibromyalgia   . Meningioma   . CAD (coronary artery disease)     HEART STENT PLACED ABOUT 2000- NO LONGER SEES CARDIOLOGIST; NO C/O OF CHEST PAINS OR SOB  . Heart murmur     MVP SINCE THE 60'S - TAKES ATENOLOL -  . Anxiety   . Swelling     BILATERAL FEET AND LEGS - ONSET OF SWELLING APRIL 2014  . GERD (gastroesophageal reflux disease)   . Arthritis   . Fracture   . CVA (cerebral vascular accident)     STROKE 7 YRS AGO - DAY AFTER BRAIN SURGERY TO REMOVE BENIGN MENINGIOMA - HAS LEFT SIDED WEAKNESS AND A LITTLE NUMBNESS  . Pain     "EXCRUCIATING PAIN" LEFT HIP - HAS A FRACTURE - IS CONFINED TO W/C AT HOME - NO WEIGHT BEARING LEFT HIP.    Past Surgical History  Procedure Laterality Date  . Brain surgery      tumor removed  . Tonsillectomy    . Partial hysterectomy    . Glaucoma repair    . Cataract extraction    . Kidney surgery      left - HX ENLARGED LEFT KIDNEY ON XRAY - SURGERY WAS DONE ON URETER   . Colonoscopy    . Coronary angioplasty    .  Hip arthroplasty Left 12/29/2012    Procedure: LEFT HIP HEMI ARTHROPLASTY ;  Surgeon: Mauri Pole, MD;  Location: [facility];  Service: Orthopedics;  Laterality: Left;    There were no vitals taken for this visit.  Visit Diagnosis:  Muscle weakness (generalized)  Stiffness of left knee  Hamstring tightness of left lower extremity  Pain in joint, pelvic region and thigh, left      Subjective Assessment - 04/02/14 1441    Symptoms Pain scale 610 Lt hip area, complaint with HEP daily   Currently in Pain? Yes   Pain Score 6    Pain Location Hip   Pain Orientation Left            [facility] Adult PT Treatment/Exercise - 04/02/14 1902    Lumbar Exercises: Seated   Other Seated Lumbar Exercises Thoracic excursion   Other Seated Lumbar Exercises UE overahead matrix   Knee/Hip Exercises: Standing   Heel Raises 20 reps   Heel Raises Limitations heel and toe rasies 1 HHA   Functional Squat 5 sets;10  reps;Limitations   Functional Squat Limitations 2 sets with 1 HHA; 3 sets without HHA   Gait Training sidestepping, forward walking with 1 HHA, exaggerated forward walking with therapist facilitation for rotatoin, marching  with nio HHA; hamstring walking   Other Standing Knee Exercises 3D hip excursion with therapist facilitation to improve hip mobiltyu   Knee/Hip Exercises: Seated   Other Seated Knee Exercises 3D thoracic excursion 10x each with Bil LE to improve extension; Scooting forward and backwards;3D reach over head             PT Short Term Goals - 04/02/14 1911    PT SHORT TERM GOAL #1   Title Patient will display increased hamstring mobility for a 90/90 hamstring test to at least 80 degrees so she can more easily bend over to touch toes   Status On-going   PT SHORT TERM GOAL #2   Title Patient will display bilateral ankle dorsiflexion of at least 5 degrees to be able to sit to stand from chair with improved mechanics/ease   Status On-going   PT SHORT TERM GOAL #3   Title  Patient will display Elys test of >120 degrees knee flexion to more easily perform sit to stand   Status On-going   PT SHORT TERM GOAL #4   Title Patient will demosntrate increased hip abduction/adduction bilateral to WNL   Status On-going   PT SHORT TERM GOAL #5   Title Patient will displays glut max/hamstring/hip abduction strength of >3/5 MMT bilateral so patient can perform sit to stand without UE support   Status On-going          PT Long Term Goals - 04/02/14 1913    PT LONG TERM GOAL #1   Title Patient will displays glut max strength of >3+/5 MMT bilateral so patient can perform 5x sit to stand without UE support in < 15 seconds   Status On-going   PT LONG TERM GOAL #2   Title Patient will display hamstrng strength >4/5 MMT so patient can ambulate at > 1.2 m/s to return to community ambulatign status   Status On-going   PT LONG TERM GOAL #3   Title Patient will display increased hip abduction strength of > 3+/5 so patient can perform single leg stand >3 seconds indicating improved balance.     Status On-going   PT LONG TERM GOAL #4   Title improve BERG balance scale to 52/56- new goal set 03/19/2014   Status On-going          Plan - 04/02/14 1908    Clinical Impression Statement Session focus on improving gait mechanics for balance training and overall hip mobiltyl with therapist facilitation, multimodal cueing required to improve overall form with gat, min assistance with balance.  Added overhead reaching to improve thoracic mobility and posture.  .Pt is making vast improvements with ability to improve  posture during gait, does continue to present hyperkyphotic which is affecting her balance.  Multiple sets of squats complete for gluteal strengthening with less HHA to assist with functional tasks like sit to stands, still requires assistnace of HHA to complete due to weakness.     PT Next Visit Plan Reassess in 2 more session   PT Plan Continue with current PT POC to  improve mobiltiy, posture and strength to improve gait mechanics to increase efficiency of gait. Continue secondary focus on improving squat depth and strength for improved mobility.  Attempt walking drills using decreased AD and squats with 1 HHA  next visit.  Begin standing high marches with 1 HHA to improve balance, posture and core strengthening next session to assist wtih scooting abilities without HHA.        Problem List Patient Active Problem List   Diagnosis Date Noted  . Muscle weakness (generalized) 02/22/2014  . Stiffness of left knee 02/22/2014  . Hamstring tightness of left lower extremity 02/22/2014  . Pain in joint, pelvic region and thigh 02/22/2014  . Difficulty walking 04/07/2013  . Osteoporosis, unspecified 03/10/2013  . Anemia 03/10/2013  . Mild malnutrition 03/10/2013  . S/P left hip hemiarthroplasty 12/30/2012  . Osteoporosis with pathological fracture with delayed healing 11/25/2012  . Pedal edema 10/27/2012  . Fracture of sacrum 10/02/2012  . Stricture and stenosis of esophagus 09/24/2011  . HYPERLIPIDEMIA-MIXED 11/03/2008  . CAD, UNSPECIFIED SITE 11/03/2008   Aldona Lento, PTA Aldona Lento 04/02/2014, 7:14 PM

## 2014-04-03 ENCOUNTER — Encounter: Payer: Self-pay | Admitting: Family Medicine

## 2014-04-03 LAB — BASIC METABOLIC PANEL
BUN: 31 mg/dL — ABNORMAL HIGH (ref 6–23)
CHLORIDE: 102 meq/L (ref 96–112)
CO2: 34 meq/L — AB (ref 19–32)
CREATININE: 0.82 mg/dL (ref 0.50–1.10)
Calcium: 9.6 mg/dL (ref 8.4–10.5)
Glucose, Bld: 82 mg/dL (ref 70–99)
Potassium: 4.5 mEq/L (ref 3.5–5.3)
Sodium: 140 mEq/L (ref 135–145)

## 2014-04-03 LAB — HEPATIC FUNCTION PANEL
ALK PHOS: 44 U/L (ref 39–117)
ALT: 24 U/L (ref 0–35)
AST: 30 U/L (ref 0–37)
Albumin: 4.2 g/dL (ref 3.5–5.2)
BILIRUBIN INDIRECT: 0.3 mg/dL (ref 0.2–1.2)
Bilirubin, Direct: 0.1 mg/dL (ref 0.0–0.3)
Total Bilirubin: 0.4 mg/dL (ref 0.2–1.2)
Total Protein: 7.1 g/dL (ref 6.0–8.3)

## 2014-04-05 ENCOUNTER — Ambulatory Visit (HOSPITAL_COMMUNITY)
Admission: RE | Admit: 2014-04-05 | Discharge: 2014-04-05 | Disposition: A | Discharge status: Home / Self Care | Payer: Medicare Other | Source: Ambulatory Visit | Attending: Orthopedic Surgery | Admitting: Orthopedic Surgery

## 2014-04-05 DIAGNOSIS — M6289 Other specified disorders of muscle: Secondary | ICD-10-CM

## 2014-04-05 DIAGNOSIS — M6281 Muscle weakness (generalized): Secondary | ICD-10-CM

## 2014-04-05 DIAGNOSIS — Z471 Aftercare following joint replacement surgery: Secondary | ICD-10-CM | POA: Diagnosis not present

## 2014-04-05 DIAGNOSIS — M25552 Pain in left hip: Secondary | ICD-10-CM

## 2014-04-05 DIAGNOSIS — M25662 Stiffness of left knee, not elsewhere classified: Secondary | ICD-10-CM

## 2014-04-05 NOTE — Therapy (Signed)
Physical Therapy Treatment  Patient Details  Name: Samantha Hudson MRN: [id number] Date of Birth: 06/08/1934  Encounter Date: 04/05/2014      PT End of Session - 04/05/14 1518    Visit Number 17   Number of Visits 22   Date for PT Re-Evaluation 04/16/14   Authorization Type medicare   Authorization Time Period Re-eval complete on 10/26 (part 1) and 10/30; gcode completed on visit #10   Authorization - Visit Number 17   Authorization - Number of Visits 20   PT Start Time 1435   PT Stop Time 1520   PT Time Calculation (min) 45 min   Equipment Utilized During Treatment Gait belt   Activity Tolerance Patient tolerated treatment well   Behavior During Therapy WFL for tasks assessed/performed      Past Medical History  Diagnosis Date  . Hyperlipemia   . IBS (irritable bowel syndrome)   . Osteoporosis   . Fibromyalgia   . Glaucoma     HAD LASER SURGERY - NO EYE DROPS REQUIRED  . Globus hystericus   . Fibromyalgia   . Meningioma   . CAD (coronary artery disease)     HEART STENT PLACED ABOUT 2000- NO LONGER SEES CARDIOLOGIST; NO C/O OF CHEST PAINS OR SOB  . Heart murmur     MVP SINCE THE 60'S - TAKES ATENOLOL -  . Anxiety   . Swelling     BILATERAL FEET AND LEGS - ONSET OF SWELLING APRIL 2014  . GERD (gastroesophageal reflux disease)   . Arthritis   . Fracture   . CVA (cerebral vascular accident)     STROKE 7 YRS AGO - DAY AFTER BRAIN SURGERY TO REMOVE BENIGN MENINGIOMA - HAS LEFT SIDED WEAKNESS AND A LITTLE NUMBNESS  . Pain     "EXCRUCIATING PAIN" LEFT HIP - HAS A FRACTURE - IS CONFINED TO W/C AT HOME - NO WEIGHT BEARING LEFT HIP.    Past Surgical History  Procedure Laterality Date  . Brain surgery      tumor removed  . Tonsillectomy    . Partial hysterectomy    . Glaucoma repair    . Cataract extraction    . Kidney surgery      left - HX ENLARGED LEFT KIDNEY ON XRAY - SURGERY WAS DONE ON URETER   . Colonoscopy    . Coronary angioplasty    . Hip arthroplasty  Left 12/29/2012    Procedure: LEFT HIP HEMI ARTHROPLASTY ;  Surgeon: Mauri Pole, MD;  Location: [facility];  Service: Orthopedics;  Laterality: Left;    There were no vitals taken for this visit.  Visit Diagnosis:  Muscle weakness (generalized)  Stiffness of left knee  Hamstring tightness of left lower extremity  Pain in joint, pelvic region and thigh, left      Subjective Assessment - 04/05/14 1445    Symptoms Pt states she's having a little discomfort in her LT hip today.  Reports it is around a 6/10 after taking a tramodol.   Currently in Pain? Yes   Pain Score 6    Pain Location Hip   Pain Orientation Left            [facility] Adult PT Treatment/Exercise - 04/05/14 1515    Lumbar Exercises: Seated   Other Seated Lumbar Exercises Thoracic excursion 5 reps each for review   Knee/Hip Exercises: Stretches   Active Hamstring Stretch 3 reps;30 seconds;Limitations   Active Hamstring Stretch Limitations 14" step  Hip Flexor Stretch 3 reps;30 seconds;Limitations   Hip Flexor Stretch Limitations 14in step   Gastroc Stretch 3 reps;30 seconds;Limitations   Gastroc Stretch Limitations slant board   Knee/Hip Exercises: Standing   Heel Raises 20 reps   Heel Raises Limitations heel and toe rasies 1 HHA   Functional Squat Limitations 2 sets with 1 HHA; 3 sets without HHA   Gait Training sidestepping, retro, forward walking with no HHA, exaggerated forward walking with therapist facilitation for rotation, marching  with no HHA; hamstring walking   Other Standing Knee Exercises 5 STS min assistance max cueing for nose over toes to prevent posterior lean with standing            PT Short Term Goals - 04/05/14 1542    PT SHORT TERM GOAL #1   Title Patient will display increased hamstring mobility for a 90/90 hamstring test to at least 80 degrees so she can more easily bend over to touch toes   Status On-going   PT SHORT TERM GOAL #2   Title Patient will display bilateral ankle  dorsiflexion of at least 5 degrees to be able to sit to stand from chair with improved mechanics/ease   Status On-going   PT SHORT TERM GOAL #3   Title Patient will display Elys test of >120 degrees knee flexion to more easily perform sit to stand   Status On-going   PT SHORT TERM GOAL #4   Title Patient will demosntrate increased hip abduction/adduction bilateral to WNL   Status On-going   PT SHORT TERM GOAL #5   Title Patient will displays glut max/hamstring/hip abduction strength of >3/5 MMT bilateral so patient can perform sit to stand without UE support   Status On-going          PT Long Term Goals - 04/05/14 1542    PT LONG TERM GOAL #1   Title Patient will displays glut max strength of >3+/5 MMT bilateral so patient can perform 5x sit to stand without UE support in < 15 seconds   Status On-going   PT LONG TERM GOAL #2   Title Patient will display hamstrng strength >4/5 MMT so patient can ambulate at > 1.2 m/s to return to community ambulatign status   Status On-going   PT LONG TERM GOAL #3   Title Patient will display increased hip abduction strength of > 3+/5 so patient can perform single leg stand >3 seconds indicating improved balance.     Status On-going   PT LONG TERM GOAL #4   Title improve BERG balance scale to 52/56- new goal set 03/19/2014   Status On-going          Plan - 04/05/14 1539    Clinical Impression Statement Focused on increasing LE mobility and balance today.  Added retro ambulation with patient having the most instability completing due to going into extension posture.  Pt requires maximal postural cues with all ambulatory activities.    Pt able to complete balance actvities without AD or UE assist today.   PT Plan Continue with current PT POC to improve mobiltiy, posture and strength to improve gait mechanics to increase efficiency of gait. Continue secondary focus on improving squat depth and strength for improved mobility.  Attempt walking drills  using decreased AD and squats with 1 HHA next visit.  Begin standing high marches with 1 HHA to improve balance, posture and core strengthening next session to assist wtih scooting abilities without HHA.  Problem List Patient Active Problem List   Diagnosis Date Noted  . Muscle weakness (generalized) 02/22/2014  . Stiffness of left knee 02/22/2014  . Hamstring tightness of left lower extremity 02/22/2014  . Pain in joint, pelvic region and thigh 02/22/2014  . Difficulty walking 04/07/2013  . Osteoporosis, unspecified 03/10/2013  . Anemia 03/10/2013  . Mild malnutrition 03/10/2013  . S/P left hip hemiarthroplasty 12/30/2012  . Osteoporosis with pathological fracture with delayed healing 11/25/2012  . Pedal edema 10/27/2012  . Fracture of sacrum 10/02/2012  . Stricture and stenosis of esophagus 09/24/2011  . HYPERLIPIDEMIA-MIXED 11/03/2008  . CAD, UNSPECIFIED SITE 11/03/2008         Teena Irani, PTA/CLT 04/05/2014, 3:44 PM

## 2014-04-06 ENCOUNTER — Ambulatory Visit (HOSPITAL_COMMUNITY): Payer: BC Managed Care – PPO | Admitting: Physical Therapy

## 2014-04-06 ENCOUNTER — Ambulatory Visit (HOSPITAL_COMMUNITY)
Admission: RE | Admit: 2014-04-06 | Discharge: 2014-04-06 | Disposition: A | Discharge status: Home / Self Care | Payer: Medicare Other | Source: Ambulatory Visit | Attending: Orthopedic Surgery | Admitting: Orthopedic Surgery

## 2014-04-06 DIAGNOSIS — M6289 Other specified disorders of muscle: Secondary | ICD-10-CM

## 2014-04-06 DIAGNOSIS — Z471 Aftercare following joint replacement surgery: Secondary | ICD-10-CM | POA: Diagnosis not present

## 2014-04-06 DIAGNOSIS — M6281 Muscle weakness (generalized): Secondary | ICD-10-CM

## 2014-04-06 DIAGNOSIS — M25662 Stiffness of left knee, not elsewhere classified: Secondary | ICD-10-CM

## 2014-04-06 DIAGNOSIS — M25552 Pain in left hip: Secondary | ICD-10-CM

## 2014-04-06 NOTE — Patient Instructions (Signed)
Pt instructed to lay prone more at home to increase lumbar extension and stretch all trunk flexor musculature.  Pt and spouse verbally expressed understanding.

## 2014-04-06 NOTE — Therapy (Signed)
Physical Therapy Treatment  Patient Details  Name: Samantha Hudson MRN: [id number] Date of Birth: Sep 19, 1934  Encounter Date: 04/06/2014      PT End of Session - 04/06/14 1507    Visit Number 18   Number of Visits 22   Date for PT Re-Evaluation 04/16/14   Authorization Type medicare   Authorization Time Period Re-eval complete on 10/26 (part 1) and 10/30; gcode completed on visit #10   Authorization - Visit Number 18   Authorization - Number of Visits 20   PT Start Time 1308   PT Stop Time 1438   PT Time Calculation (min) 46 min   Equipment Utilized During Treatment Gait belt   Activity Tolerance Patient tolerated treatment well   Behavior During Therapy WFL for tasks assessed/performed      Past Medical History  Diagnosis Date  . Hyperlipemia   . IBS (irritable bowel syndrome)   . Osteoporosis   . Fibromyalgia   . Glaucoma     HAD LASER SURGERY - NO EYE DROPS REQUIRED  . Globus hystericus   . Fibromyalgia   . Meningioma   . CAD (coronary artery disease)     HEART STENT PLACED ABOUT 2000- NO LONGER SEES CARDIOLOGIST; NO C/O OF CHEST PAINS OR SOB  . Heart murmur     MVP SINCE THE 60'S - TAKES ATENOLOL -  . Anxiety   . Swelling     BILATERAL FEET AND LEGS - ONSET OF SWELLING APRIL 2014  . GERD (gastroesophageal reflux disease)   . Arthritis   . Fracture   . CVA (cerebral vascular accident)     STROKE 7 YRS AGO - DAY AFTER BRAIN SURGERY TO REMOVE BENIGN MENINGIOMA - HAS LEFT SIDED WEAKNESS AND A LITTLE NUMBNESS  . Pain     "EXCRUCIATING PAIN" LEFT HIP - HAS A FRACTURE - IS CONFINED TO W/C AT HOME - NO WEIGHT BEARING LEFT HIP.    Past Surgical History  Procedure Laterality Date  . Brain surgery      tumor removed  . Tonsillectomy    . Partial hysterectomy    . Glaucoma repair    . Cataract extraction    . Kidney surgery      left - HX ENLARGED LEFT KIDNEY ON XRAY - SURGERY WAS DONE ON URETER   . Colonoscopy    . Coronary angioplasty    . Hip arthroplasty  Left 12/29/2012    Procedure: LEFT HIP HEMI ARTHROPLASTY ;  Surgeon: Mauri Pole, MD;  Location: [facility];  Service: Orthopedics;  Laterality: Left;    There were no vitals taken for this visit.  Visit Diagnosis:  No diagnosis found.      Subjective Assessment - 04/06/14 1400    Symptoms Pt states her Lt hip continues to bother her as well as her Rt knee.  Currently wtih 5/10 in her Lt hip, no pain in the Rt knee.   Pt states she feels like she has gotten stronger with therapy.   Pertinent History Patient had 2 months (Surgery 2014, august 14) of PT at [facility], then 7 weeks of HHPT and now reports for out patient physial therapy follwoign Lt hip hemi replacement. Osteoporosis, history of multiple fractures: Rt pelvis Lt pelvis, sacrum, and Rt femur. Patient has a previous history of falls breakig wrist in february 2014.  CVA 8 years ago resultign in Lt leg thigh and hip pain. history of meningioma removal.  history of fibromyalgia. Patient tends  to not eperform HEP and has not had therapy since 2014.    Currently in Pain? Yes   Pain Score 5    Pain Location Hip   Pain Orientation Left   Pain Descriptors / Indicators Dull;Guarding;Contraction   Pain Frequency Intermittent            [facility] Adult PT Treatment/Exercise - 04/06/14 1411    Knee/Hip Exercises: Stretches   Active Hamstring Stretch 3 reps;30 seconds;Limitations   Active Hamstring Stretch Limitations 14" step   Quad Stretch 3 reps;30 seconds;Limitations   Quad Stretch Limitations prone with rope   Hip Flexor Stretch 3 reps;30 seconds;Limitations   Hip Flexor Stretch Limitations 14in step   Knee/Hip Exercises: Standing   Gait Training sidestepping, retro, forward walking with no HHA, exaggerated forward walking with therapist facilitation for rotation, marching  with no HHA; hamstring walking   Knee/Hip Exercises: Prone   Hamstring Curl 10 reps   Hamstring Curl Limitations bilateral   Other Prone Exercises Prone lying  with pillow under hips 5 minutes            PT Short Term Goals - 04/05/14 1542    PT SHORT TERM GOAL #1   Title Patient will display increased hamstring mobility for a 90/90 hamstring test to at least 80 degrees so she can more easily bend over to touch toes   Status On-going   PT SHORT TERM GOAL #2   Title Patient will display bilateral ankle dorsiflexion of at least 5 degrees to be able to sit to stand from chair with improved mechanics/ease   Status On-going   PT SHORT TERM GOAL #3   Title Patient will display Elys test of >120 degrees knee flexion to more easily perform sit to stand   Status On-going   PT SHORT TERM GOAL #4   Title Patient will demosntrate increased hip abduction/adduction bilateral to WNL   Status On-going   PT SHORT TERM GOAL #5   Title Patient will displays glut max/hamstring/hip abduction strength of >3/5 MMT bilateral so patient can perform sit to stand without UE support   Status On-going          PT Long Term Goals - 04/05/14 1542    PT LONG TERM GOAL #1   Title Patient will displays glut max strength of >3+/5 MMT bilateral so patient can perform 5x sit to stand without UE support in < 15 seconds   Status On-going   PT LONG TERM GOAL #2   Title Patient will display hamstrng strength >4/5 MMT so patient can ambulate at > 1.2 m/s to return to community ambulatign status   Status On-going   PT LONG TERM GOAL #3   Title Patient will display increased hip abduction strength of > 3+/5 so patient can perform single leg stand >3 seconds indicating improved balance.     Status On-going   PT LONG TERM GOAL #4   Title improve BERG balance scale to 52/56- new goal set 03/19/2014   Status On-going          Plan - 04/06/14 1507    PT Next Visit Plan Reassess in 4 more sessions; gcode in 2 more visits.  Progress balance actvities.   PT Plan Continue with current PT POC to improve mobiltiy, posture and strength to improve gait mechanics to increase  efficiency of gait. Continue secondary focus on improving squat depth and strength for improved mobility.  Attempt walking drills using decreased AD and squats with 1 HHA  next visit.  Begin standing high marches with 1 HHA to improve balance, posture and core strengthening next session to assist wtih scooting abilities without HHA.        Problem List Patient Active Problem List   Diagnosis Date Noted  . Muscle weakness (generalized) 02/22/2014  . Stiffness of left knee 02/22/2014  . Hamstring tightness of left lower extremity 02/22/2014  . Pain in joint, pelvic region and thigh 02/22/2014  . Difficulty walking 04/07/2013  . Osteoporosis, unspecified 03/10/2013  . Anemia 03/10/2013  . Mild malnutrition 03/10/2013  . S/P left hip hemiarthroplasty 12/30/2012  . Osteoporosis with pathological fracture with delayed healing 11/25/2012  . Pedal edema 10/27/2012  . Fracture of sacrum 10/02/2012  . Stricture and stenosis of esophagus 09/24/2011  . HYPERLIPIDEMIA-MIXED 11/03/2008  . CAD, UNSPECIFIED SITE 11/03/2008         Teena Irani, PTA/CLT 04/06/2014, 3:08 PM

## 2014-04-09 ENCOUNTER — Ambulatory Visit (HOSPITAL_COMMUNITY)
Admission: RE | Admit: 2014-04-09 | Discharge: 2014-04-09 | Disposition: A | Discharge status: Home / Self Care | Payer: Medicare Other | Source: Ambulatory Visit | Attending: Orthopedic Surgery | Admitting: Orthopedic Surgery

## 2014-04-09 DIAGNOSIS — Z471 Aftercare following joint replacement surgery: Secondary | ICD-10-CM | POA: Diagnosis not present

## 2014-04-09 DIAGNOSIS — M25552 Pain in left hip: Secondary | ICD-10-CM

## 2014-04-09 DIAGNOSIS — M6289 Other specified disorders of muscle: Secondary | ICD-10-CM

## 2014-04-09 DIAGNOSIS — M25662 Stiffness of left knee, not elsewhere classified: Secondary | ICD-10-CM

## 2014-04-09 DIAGNOSIS — M6281 Muscle weakness (generalized): Secondary | ICD-10-CM

## 2014-04-09 NOTE — Therapy (Signed)
Physical Therapy Treatment  Patient Details  Name: Samantha Hudson MRN: [id number] Date of Birth: Aug 26, 1934  Encounter Date: 04/09/2014      PT End of Session - 04/09/14 1249    Visit Number 19   Number of Visits 22   Date for PT Re-Evaluation 04/16/14   Authorization Type medicare   Authorization Time Period Re-eval complete on 10/26 (part 1) and 10/30; gcode completed on visit #19   Authorization - Visit Number 19   Authorization - Number of Visits 20   PT Start Time 1146   PT Stop Time 1230   PT Time Calculation (min) 44 min   Equipment Utilized During Treatment Gait belt   Activity Tolerance Patient tolerated treatment well   Behavior During Therapy WFL for tasks assessed/performed      Past Medical History  Diagnosis Date  . Hyperlipemia   . IBS (irritable bowel syndrome)   . Osteoporosis   . Fibromyalgia   . Glaucoma     HAD LASER SURGERY - NO EYE DROPS REQUIRED  . Globus hystericus   . Fibromyalgia   . Meningioma   . CAD (coronary artery disease)     HEART STENT PLACED ABOUT 2000- NO LONGER SEES CARDIOLOGIST; NO C/O OF CHEST PAINS OR SOB  . Heart murmur     MVP SINCE THE 60'S - TAKES ATENOLOL -  . Anxiety   . Swelling     BILATERAL FEET AND LEGS - ONSET OF SWELLING APRIL 2014  . GERD (gastroesophageal reflux disease)   . Arthritis   . Fracture   . CVA (cerebral vascular accident)     STROKE 7 YRS AGO - DAY AFTER BRAIN SURGERY TO REMOVE BENIGN MENINGIOMA - HAS LEFT SIDED WEAKNESS AND A LITTLE NUMBNESS  . Pain     "EXCRUCIATING PAIN" LEFT HIP - HAS A FRACTURE - IS CONFINED TO W/C AT HOME - NO WEIGHT BEARING LEFT HIP.    Past Surgical History  Procedure Laterality Date  . Brain surgery      tumor removed  . Tonsillectomy    . Partial hysterectomy    . Glaucoma repair    . Cataract extraction    . Kidney surgery      left - HX ENLARGED LEFT KIDNEY ON XRAY - SURGERY WAS DONE ON URETER   . Colonoscopy    . Coronary angioplasty    . Hip arthroplasty  Left 12/29/2012    Procedure: LEFT HIP HEMI ARTHROPLASTY ;  Surgeon: Mauri Pole, MD;  Location: [facility];  Service: Orthopedics;  Laterality: Left;    There were no vitals taken for this visit.  Visit Diagnosis:  Muscle weakness (generalized)  Stiffness of left knee  Hamstring tightness of left lower extremity  Pain in joint, pelvic region and thigh, left        [facility] PT Assessment - 04/09/14 0001    Observation/Other Assessments   Focus on Therapeutic Outcomes (FOTO)  56% limited          [facility] Adult PT Treatment/Exercise - 04/09/14 0001    Knee/Hip Exercises: Stretches   Active Hamstring Stretch 3 reps;30 seconds;Limitations   Active Hamstring Stretch Limitations 14" step   Quad Stretch 3 reps;30 seconds;Limitations   Quad Stretch Limitations prone with rope   Knee/Hip Exercises: Standing   Heel Raises 20 reps   Heel Raises Limitations heel and toe rasies 1 HHA   Functional Squat Limitations 2 sets with 1 HHA; 3 sets without HHA  Knee/Hip Exercises: Prone   Other Prone Exercises Supine bridges 4x 10            PT Short Term Goals - 04/10/14 1222    PT SHORT TERM GOAL #1   Title Patient will display increased hamstring mobility for a 90/90 hamstring test to at least 80 degrees so she can more easily bend over to touch toes   Status On-going   PT SHORT TERM GOAL #2   Title Patient will display bilateral ankle dorsiflexion of at least 5 degrees to be able to sit to stand from chair with improved mechanics/ease   Status On-going   PT SHORT TERM GOAL #3   Title Patient will display Elys test of >120 degrees knee flexion to more easily perform sit to stand   Status On-going   PT SHORT TERM GOAL #4   Title Patient will demosntrate increased hip abduction/adduction bilateral to WNL   Status On-going   PT SHORT TERM GOAL #5   Title Patient will displays glut max/hamstring/hip abduction strength of >3/5 MMT bilateral so patient can perform sit to stand without UE  support   Status On-going          PT Long Term Goals - Apr 10, 2014 1223    PT LONG TERM GOAL #1   Title Patient will displays glut max strength of >3+/5 MMT bilateral so patient can perform 5x sit to stand without UE support in < 15 seconds   Status On-going   PT LONG TERM GOAL #2   Title Patient will display hamstring strength >4/5 MMT so patient can ambulate at > 1.2 m/s to return to community ambulatign status   Status On-going   PT LONG TERM GOAL #3   Title Patient will display increased hip abduction strength of > 3+/5 so patient can perform single leg stand >3 seconds indicating improved balance.     Status On-going   PT LONG TERM GOAL #4   Title improve BERG balance scale to 52/56- new goal set 03/19/2014   Status On-going          Plan - 04/10/14 1250    Clinical Impression Statement Continued focus on increasing LE mobility to improve posture and balance. Pt unable to get hips/trunk flat on mat in prone due to lack of extension. Encouraged patient to lay prone more at home and complete hamstring curls and quadriceps stretch while in this position. Pt verbalized understanding. Patient demonstrated limited squat depth and hamstring flexibility for which therapy will continue to focus on improving.           G-Codes - 2014/04/10 1214    Functional Assessment Tool Used FOTO 56% limited, was 80%   Functional Limitation Changing and maintaining body position   Changing and Maintaining Body Position Current Status (U2353) At least 40 percent but less than 60 percent impaired, limited or restricted   Changing and Maintaining Body Position Goal Status (I1443) At least 20 percent but less than 40 percent impaired, limited or restricted      Problem List Patient Active Problem List   Diagnosis Date Noted  . Muscle weakness (generalized) 02/22/2014  . Stiffness of left knee 02/22/2014  . Hamstring tightness of left lower extremity 02/22/2014  . Pain in joint, pelvic region and  thigh 02/22/2014  . Difficulty walking 04/07/2013  . Osteoporosis, unspecified 03/10/2013  . Anemia 03/10/2013  . Mild malnutrition 03/10/2013  . S/P left hip hemiarthroplasty 12/30/2012  . Osteoporosis with pathological fracture with delayed healing  11/25/2012  . Pedal edema 10/27/2012  . Fracture of sacrum 10/02/2012  . Stricture and stenosis of esophagus 09/24/2011  . HYPERLIPIDEMIA-MIXED 11/03/2008  . CAD, UNSPECIFIED SITE 11/03/2008    Devona Konig PT DPT [phone]

## 2014-04-12 ENCOUNTER — Ambulatory Visit (HOSPITAL_COMMUNITY)
Admission: RE | Admit: 2014-04-12 | Discharge: 2014-04-12 | Disposition: A | Discharge status: Home / Self Care | Payer: Medicare Other | Source: Ambulatory Visit | Attending: Orthopedic Surgery | Admitting: Orthopedic Surgery

## 2014-04-12 DIAGNOSIS — M25662 Stiffness of left knee, not elsewhere classified: Secondary | ICD-10-CM

## 2014-04-12 DIAGNOSIS — M25552 Pain in left hip: Secondary | ICD-10-CM

## 2014-04-12 DIAGNOSIS — M6281 Muscle weakness (generalized): Secondary | ICD-10-CM

## 2014-04-12 DIAGNOSIS — M6289 Other specified disorders of muscle: Secondary | ICD-10-CM

## 2014-04-12 DIAGNOSIS — Z471 Aftercare following joint replacement surgery: Secondary | ICD-10-CM | POA: Diagnosis not present

## 2014-04-12 NOTE — Therapy (Signed)
Physical Therapy Treatment  Patient Details  Name: Samantha Hudson MRN: [id number] Date of Birth: 1934-09-01  Encounter Date: 04/12/2014      PT End of Session - 04/12/14 1258    Visit Number 20   Number of Visits 22   Date for PT Re-Evaluation 04/16/14   Authorization Type medicare   Authorization Time Period Re-eval complete on 10/26 (part 1) and 10/30; gcode completed on visit #19   Authorization - Visit Number 19   Authorization - Number of Visits 22   PT Start Time 1147   PT Stop Time 1230   PT Time Calculation (min) 43 min   Equipment Utilized During Treatment Gait belt   Activity Tolerance Patient tolerated treatment well   Behavior During Therapy WFL for tasks assessed/performed      Past Medical History  Diagnosis Date  . Hyperlipemia   . IBS (irritable bowel syndrome)   . Osteoporosis   . Fibromyalgia   . Glaucoma     HAD LASER SURGERY - NO EYE DROPS REQUIRED  . Globus hystericus   . Fibromyalgia   . Meningioma   . CAD (coronary artery disease)     HEART STENT PLACED ABOUT 2000- NO LONGER SEES CARDIOLOGIST; NO C/O OF CHEST PAINS OR SOB  . Heart murmur     MVP SINCE THE 60'S - TAKES ATENOLOL -  . Anxiety   . Swelling     BILATERAL FEET AND LEGS - ONSET OF SWELLING APRIL 2014  . GERD (gastroesophageal reflux disease)   . Arthritis   . Fracture   . CVA (cerebral vascular accident)     STROKE 7 YRS AGO - DAY AFTER BRAIN SURGERY TO REMOVE BENIGN MENINGIOMA - HAS LEFT SIDED WEAKNESS AND A LITTLE NUMBNESS  . Pain     "EXCRUCIATING PAIN" LEFT HIP - HAS A FRACTURE - IS CONFINED TO W/C AT HOME - NO WEIGHT BEARING LEFT HIP.    Past Surgical History  Procedure Laterality Date  . Brain surgery      tumor removed  . Tonsillectomy    . Partial hysterectomy    . Glaucoma repair    . Cataract extraction    . Kidney surgery      left - HX ENLARGED LEFT KIDNEY ON XRAY - SURGERY WAS DONE ON URETER   . Colonoscopy    . Coronary angioplasty    . Hip arthroplasty  Left 12/29/2012    Procedure: LEFT HIP HEMI ARTHROPLASTY ;  Surgeon: Mauri Pole, MD;  Location: [facility];  Service: Orthopedics;  Laterality: Left;    There were no vitals taken for this visit.  Visit Diagnosis:  Muscle weakness (generalized)  Stiffness of left knee  Hamstring tightness of left lower extremity  Pain in joint, pelvic region and thigh, left      Subjective Assessment - 04/12/14 1251    Symptoms Pt noted no pain this session. stated she is doing her HEP and feels good.    Currently in Pain? No/denies            [facility] Adult PT Treatment/Exercise - 04/12/14 0001    Knee/Hip Exercises: Standing   Heel Raises 20 reps   Heel Raises Limitations heel and toe rasies 1 HHA   Functional Squat Limitations 2 sets with 1 HHA; 3 sets without HHA   Rocker Board 5 minutes   Gait Training sidestepping, retro, forward walking with no HHA, exaggerated forward walking with therapist facilitation for rotation, marching  with  no HHA; hamstring walking   Other Standing Knee Exercises 3D hip excursion with therapist facilitation to improve hip mobiltyu   Other Standing Knee Exercises 5 STS min assistance max cueing for nose over toes to prevent posterior lean with standing, standing rows for postural control 10, standing on Airex pad 4x 1 minutes, standing 3 way overhead dumbbell matrix for posture and trunk/hip control.     Knee/Hip Exercises: Seated   Other Seated Knee Exercises 3D thoracic excursion 10x each with Bil LE to improve extension; Scooting forward and backwards;3D reach over head             PT Short Term Goals - 04/12/14 1302    PT SHORT TERM GOAL #1   Title Patient will display increased hamstring mobility for a 90/90 hamstring test to at least 80 degrees so she can more easily bend over to touch toes   Status On-going   PT SHORT TERM GOAL #2   Title Patient will display bilateral ankle dorsiflexion of at least 5 degrees to be able to sit to stand from chair  with improved mechanics/ease   Status On-going   PT SHORT TERM GOAL #3   Title Patient will display Elys test of >120 degrees knee flexion to more easily perform sit to stand   Status On-going   PT SHORT TERM GOAL #4   Title Patient will demosntrate increased hip abduction/adduction bilateral to WNL   Status On-going   PT SHORT TERM GOAL #5   Title Patient will displays glut max/hamstring/hip abduction strength of >3/5 MMT bilateral so patient can perform sit to stand without UE support   Status On-going          PT Long Term Goals - 04/12/14 1302    PT LONG TERM GOAL #1   Title Patient will displays glut max strength of >3+/5 MMT bilateral so patient can perform 5x sit to stand without UE support in < 15 seconds   Status On-going   PT LONG TERM GOAL #2   Title Patient will display hamstring strength >4/5 MMT so patient can ambulate at > 1.2 m/s to return to community ambulatign status   Status On-going   PT LONG TERM GOAL #3   Title Patient will display increased hip abduction strength of > 3+/5 so patient can perform single leg stand >3 seconds indicating improved balance.     Status On-going   PT LONG TERM GOAL #4   Title improve BERG balance scale to 52/56- new goal set 03/19/2014   Status On-going          Plan - 04/12/14 1259    Clinical Impression Statement Continued focus on increasing LE mobility/stability to improve posture and balance. UE exercises givent for postural control. Encouraged patient to lay prone more at home and complete hamstring curls and quadriceps stretch while in this position. Pt verbalized understanding. Patient demonstrated limited squat depth and hamstring flexibility for which therapy will continue to focus on improving   PT Next Visit Plan Reassess in 2 more sessions;  Progress balance actvities. and hip         Problem List Patient Active Problem List   Diagnosis Date Noted  . Muscle weakness (generalized) 02/22/2014  . Stiffness of  left knee 02/22/2014  . Hamstring tightness of left lower extremity 02/22/2014  . Pain in joint, pelvic region and thigh 02/22/2014  . Difficulty walking 04/07/2013  . Osteoporosis, unspecified 03/10/2013  . Anemia 03/10/2013  . Mild malnutrition 03/10/2013  .  S/P left hip hemiarthroplasty 12/30/2012  . Osteoporosis with pathological fracture with delayed healing 11/25/2012  . Pedal edema 10/27/2012  . Fracture of sacrum 10/02/2012  . Stricture and stenosis of esophagus 09/24/2011  . HYPERLIPIDEMIA-MIXED 11/03/2008  . CAD, UNSPECIFIED SITE 11/03/2008   Devona Konig PT DPT [phone]

## 2014-04-21 ENCOUNTER — Ambulatory Visit (HOSPITAL_COMMUNITY)
Admission: RE | Admit: 2014-04-21 | Discharge: 2014-04-21 | Disposition: A | Discharge status: Home / Self Care | Payer: Medicare Other | Source: Ambulatory Visit | Attending: Family Medicine | Admitting: Family Medicine

## 2014-04-21 DIAGNOSIS — M25552 Pain in left hip: Secondary | ICD-10-CM | POA: Diagnosis not present

## 2014-04-21 DIAGNOSIS — M25652 Stiffness of left hip, not elsewhere classified: Secondary | ICD-10-CM | POA: Insufficient documentation

## 2014-04-21 DIAGNOSIS — M6289 Other specified disorders of muscle: Secondary | ICD-10-CM

## 2014-04-21 DIAGNOSIS — R269 Unspecified abnormalities of gait and mobility: Secondary | ICD-10-CM | POA: Diagnosis not present

## 2014-04-21 DIAGNOSIS — Z471 Aftercare following joint replacement surgery: Secondary | ICD-10-CM | POA: Diagnosis not present

## 2014-04-21 DIAGNOSIS — M6281 Muscle weakness (generalized): Secondary | ICD-10-CM | POA: Insufficient documentation

## 2014-04-21 DIAGNOSIS — M25662 Stiffness of left knee, not elsewhere classified: Secondary | ICD-10-CM

## 2014-04-21 NOTE — Therapy (Signed)
[facility] [address] [place], [place], [address] Phone: [phone]   Fax:  [phone]  Physical Therapy Treatment  Patient Details  Name: Samantha Hudson MRN: [id number] Date of Birth: [birth year]  Encounter Date: 04/21/2014      PT End of Session - 04/21/14 1438    Visit Number 21   Number of Visits 22   Date for PT Re-Evaluation 04/16/14   Authorization Type medicare   Authorization Time Period Re-eval complete on 10/26 (part 1) and 10/30; gcode completed on visit #19   Authorization - Visit Number 21   Authorization - Number of Visits 22   PT Start Time 1350   PT Stop Time 1435   PT Time Calculation (min) 45 min   Equipment Utilized During Treatment Gait belt   Activity Tolerance Patient tolerated treatment well   Behavior During Therapy [facility] for tasks assessed/performed      Past Medical History  Diagnosis Date  . Hyperlipemia   . IBS (irritable bowel syndrome)   . Osteoporosis   . Fibromyalgia   . Glaucoma     HAD LASER SURGERY - NO EYE DROPS REQUIRED  . Globus hystericus   . Fibromyalgia   . Meningioma   . CAD (coronary artery disease)     HEART STENT PLACED ABOUT 2000- NO LONGER SEES CARDIOLOGIST; NO C/O OF CHEST PAINS OR SOB  . Heart murmur     MVP SINCE THE 60'S - TAKES ATENOLOL -  . Anxiety   . Swelling     BILATERAL FEET AND LEGS - ONSET OF SWELLING APRIL 2014  . GERD (gastroesophageal reflux disease)   . Arthritis   . Fracture   . CVA (cerebral vascular accident)     STROKE 7 YRS AGO - DAY AFTER BRAIN SURGERY TO REMOVE BENIGN MENINGIOMA - HAS LEFT SIDED WEAKNESS AND A LITTLE NUMBNESS  . Pain     "EXCRUCIATING PAIN" LEFT HIP - HAS A FRACTURE - IS CONFINED TO W/C AT HOME - NO WEIGHT BEARING LEFT HIP.    Past Surgical History  Procedure Laterality Date  . Brain surgery      tumor removed  . Tonsillectomy    . Partial hysterectomy    . Glaucoma repair    . Cataract extraction    . Kidney surgery      left -  HX ENLARGED LEFT KIDNEY ON XRAY - SURGERY WAS DONE ON URETER   . Colonoscopy    . Coronary angioplasty    . Hip arthroplasty Left 12/29/2012    Procedure: LEFT HIP HEMI ARTHROPLASTY ;  Surgeon: Mauri Pole, MD;  Location: [facility];  Service: Orthopedics;  Laterality: Left;    There were no vitals taken for this visit.  Visit Diagnosis:  Muscle weakness (generalized)  Stiffness of left knee  Hamstring tightness of left lower extremity  Pain in joint, pelvic region and thigh, left      Subjective Assessment - 04/21/14 1447    Symptoms Pt and spouse states patient is compliant with HEP.  Currently without pain.            [place] Adult PT Treatment/Exercise - 04/21/14 0001    Knee/Hip Exercises: Standing   Heel Raises 20 reps   Heel Raises Limitations heel and toe rasies 1 HHA   Functional Squat Limitations 2 sets with 1 HHA; 3 sets without HHA   Gait Training sidestepping, retro, forward walking with no HHA, exaggerated forward walking with therapist facilitation  for rotation, marching  with no HHA; hamstring walking   Other Standing Knee Exercises 3D hip excursion with therapist facilitation to improve hip mobilty   Other Standing Knee Exercises 5 STS min assistance max cueing for nose over toes to prevent posterior lean with standing, standing UE flexion/abd for postural control 10X on airex, standing on Airex pad reciprocal marching with 1 HHA, standing 3 way overhead dumbbell matrix for posture and trunk/hip control.              PT Short Term Goals - 04/21/14 1355    PT SHORT TERM GOAL #1   Title Patient will display increased hamstring mobility for a 90/90 hamstring test to at least 80 degrees so she can more easily bend over to touch toes   Status On-going   PT SHORT TERM GOAL #2   Title Patient will display bilateral ankle dorsiflexion of at least 5 degrees to be able to sit to stand from chair with improved mechanics/ease   Status On-going   PT SHORT TERM GOAL #3    Title Patient will display Elys test of >120 degrees knee flexion to more easily perform sit to stand   Status On-going   PT SHORT TERM GOAL #4   Title Patient will demosntrate increased hip abduction/adduction bilateral to WNL   Status On-going   PT SHORT TERM GOAL #5   Title Patient will displays glut max/hamstring/hip abduction strength of >3/5 MMT bilateral so patient can perform sit to stand without UE support   Status On-going          PT Long Term Goals - 04/21/14 1355    PT LONG TERM GOAL #1   Title Patient will displays glut max strength of >3+/5 MMT bilateral so patient can perform 5x sit to stand without UE support in < 15 seconds   Status On-going   PT LONG TERM GOAL #2   Title Patient will display hamstring strength >4/5 MMT so patient can ambulate at > 1.2 m/s to return to community ambulatign status   Status On-going   PT LONG TERM GOAL #3   Title Patient will display increased hip abduction strength of > 3+/5 so patient can perform single leg stand >3 seconds indicating improved balance.     Status On-going   PT LONG TERM GOAL #4   Title improve BERG balance scale to 52/56- new goal set 03/19/2014   Status On-going          Plan - 04/21/14 1443    Clinical Impression Statement Focused on improving postural stability and dynamic balance.  Pt with continued difficulty shifting weight anteriorly for proper sit<->stand mechanics.  PT tends to lock into extension and unable to flex knees for further squat.  Therapist facilitation to take larger steps with balance activities ,  discussed functional goals with patient and spouse and informed them of upcoming re-evaluation for tomorrows visit.    PT Next Visit Plan Continue to increase squat depth and progress balance and postural stability.  Re-evaluate next visit.           Problem List Patient Active Problem List   Diagnosis Date Noted  . Muscle weakness (generalized) 02/22/2014  . Stiffness of left knee  02/22/2014  . Hamstring tightness of left lower extremity 02/22/2014  . Pain in joint, pelvic region and thigh 02/22/2014  . Difficulty walking 04/07/2013  . Osteoporosis, unspecified 03/10/2013  . Anemia 03/10/2013  . Mild malnutrition 03/10/2013  . S/P left hip hemiarthroplasty  12/30/2012  . Osteoporosis with pathological fracture with delayed healing 11/25/2012  . Pedal edema 10/27/2012  . Fracture of sacrum 10/02/2012  . Stricture and stenosis of esophagus 09/24/2011  . HYPERLIPIDEMIA-MIXED 11/03/2008  . CAD, UNSPECIFIED SITE 11/03/2008    Teena Irani, PTA/CLT [phone] 04/21/2014, 2:48 PM

## 2014-04-22 ENCOUNTER — Ambulatory Visit (HOSPITAL_COMMUNITY)
Admission: RE | Admit: 2014-04-22 | Discharge: 2014-04-22 | Disposition: A | Discharge status: Home / Self Care | Payer: Medicare Other | Source: Ambulatory Visit | Attending: Orthopedic Surgery | Admitting: Orthopedic Surgery

## 2014-04-22 DIAGNOSIS — Z471 Aftercare following joint replacement surgery: Secondary | ICD-10-CM | POA: Diagnosis not present

## 2014-04-22 DIAGNOSIS — M6289 Other specified disorders of muscle: Secondary | ICD-10-CM

## 2014-04-22 DIAGNOSIS — M25552 Pain in left hip: Secondary | ICD-10-CM

## 2014-04-22 DIAGNOSIS — M6281 Muscle weakness (generalized): Secondary | ICD-10-CM

## 2014-04-22 DIAGNOSIS — M25662 Stiffness of left knee, not elsewhere classified: Secondary | ICD-10-CM

## 2014-04-22 NOTE — Therapy (Signed)
[facility] [address] [place], [place], [address] Phone: [phone]   Fax:  [phone]  Physical Therapy Re-Evaluation  Patient Details  Name: Samantha Hudson MRN: [id number] Date of Birth: 02-20-1935  Encounter Date: 04/22/2014      PT End of Session - 04/22/14 1500    Visit Number 22   Number of Visits 30   Date for PT Re-Evaluation 05/21/14   Authorization Type medicare   Authorization Time Period G-code updated visit 97   Authorization - Visit Number 71   Authorization - Number of Visits 30   PT Start Time 1300   PT Stop Time 1354   PT Time Calculation (min) 54 min   Equipment Utilized During Treatment Gait belt   Activity Tolerance Patient tolerated treatment well   Behavior During Therapy WFL for tasks assessed/performed      Past Medical History  Diagnosis Date  . Hyperlipemia   . IBS (irritable bowel syndrome)   . Osteoporosis   . Fibromyalgia   . Glaucoma     HAD LASER SURGERY - NO EYE DROPS REQUIRED  . Globus hystericus   . Fibromyalgia   . Meningioma   . CAD (coronary artery disease)     HEART STENT PLACED ABOUT 2000- NO LONGER SEES CARDIOLOGIST; NO C/O OF CHEST PAINS OR SOB  . Heart murmur     MVP SINCE THE 60'S - TAKES ATENOLOL -  . Anxiety   . Swelling     BILATERAL FEET AND LEGS - ONSET OF SWELLING APRIL 2014  . GERD (gastroesophageal reflux disease)   . Arthritis   . Fracture   . CVA (cerebral vascular accident)     STROKE 7 YRS AGO - DAY AFTER BRAIN SURGERY TO REMOVE BENIGN MENINGIOMA - HAS LEFT SIDED WEAKNESS AND A LITTLE NUMBNESS  . Pain     "EXCRUCIATING PAIN" LEFT HIP - HAS A FRACTURE - IS CONFINED TO W/C AT HOME - NO WEIGHT BEARING LEFT HIP.    Past Surgical History  Procedure Laterality Date  . Brain surgery      tumor removed  . Tonsillectomy    . Partial hysterectomy    . Glaucoma repair    . Cataract extraction    . Kidney surgery      left - HX ENLARGED LEFT KIDNEY ON XRAY - SURGERY WAS  DONE ON URETER   . Colonoscopy    . Coronary angioplasty    . Hip arthroplasty Left 12/29/2012    Procedure: LEFT HIP HEMI ARTHROPLASTY ;  Surgeon: Mauri Pole, MD;  Location: [facility];  Service: Orthopedics;  Laterality: Left;    There were no vitals taken for this visit.  Visit Diagnosis:  Muscle weakness (generalized)  Stiffness of left knee  Hamstring tightness of left lower extremity  Pain in joint, pelvic region and thigh, left      Subjective Assessment - 04/22/14 1304    Currently in Pain? Yes   Pain Score 5   Best - 2-3/10, Worst - 8/10   Pain Location Hip   Pain Orientation Left          [facility] PT Assessment - 04/22/14 1310    Assessment   Medical Diagnosis Muscle Weakness/Gait from lt hip hemi-arthroplasty 12/2012   Next MD Visit Alvan Dame 04/23/14   Observation/Other Assessments   Focus on Therapeutic Outcomes (FOTO)  Limitation 55% (56%)   AROM   Left Ankle Dorsiflexion --  AROM -14, PROM -8 degrees  Strength   Right Hip Flexion --  4-/5 (was 3+/5)   Right Hip Extension 3+/5  was 3+/5   Right Hip ABduction --  4-/5 (was 3+/5)   Left Hip Flexion 4/5  was 4/5   Left Hip Extension 3/5  was 3/5   Left Hip ABduction 3/5  was 3-/5   Right Knee Flexion --  4+/5 (was 4/5)   Right Knee Extension 4/5  was 4/5   Left Knee Flexion --  4+/5 (was 4/5)   Left Knee Extension 4/5  was 4/5   Right Ankle Dorsiflexion 3/5  was 2-/5   Left Ankle Dorsiflexion 2-/5  was 2-/5   Flexibility   Soft Tissue Assessment /Muscle Lenght yes   Hamstrings 90/90 Test : Rt -32, Lt -31   Quadriceps Rt 11 1/4in,  Lt  10 1/2 in to glute          [facility] Adult PT Treatment/Exercise - 04/22/14 1303    Standardized Balance Assessment   Standardized Balance Assessment Berg Balance Test   Berg Balance Test   Sit to Stand Able to stand  independently using hands   Standing Unsupported Able to stand safely 2 minutes   Sitting with Back Unsupported but Feet Supported on Floor or  Stool Able to sit safely and securely 2 minutes   Stand to Sit Controls descent by using hands   Transfers Able to transfer safely, definite need of hands   Standing Unsupported with Eyes Closed Able to stand 10 seconds safely   Standing Ubsupported with Feet Together Able to place feet together independently and stand 1 minute safely   From Standing, Reach Forward with Outstretched Arm Can reach forward >12 cm safely (5")   From Standing Position, Pick up Object from Floor Unable to pick up shoe, but reaches 2-5 cm (1-2") from shoe and balances independently  9 inch from floor   From Standing Position, Turn to Look Behind Over each Shoulder Looks behind one side only/other side shows less weight shift   Turn 360 Degrees Able to turn 360 degrees safely but slowly  Rt 29", Lt 21"   Standing Unsupported, Alternately Place Feet on Step/Stool Able to complete 4 steps without aid or supervision  Able to complete in 16" with one hand on RW   Standing Unsupported, One Foot in Front Able to take small step independently and hold 30 seconds  Able to achieve tandem on Rt only and hold for 16"   Standing on One Leg Tries to lift leg/unable to hold 3 seconds but remains standing independently   Total Score 40   Exercises   Exercises Knee/Hip   Knee/Hip Exercises: Stretches   Active Hamstring Stretch 2 reps;30 seconds   Active Hamstring Stretch Limitations 14" step   Quad Stretch 3 reps;30 seconds;Limitations   Quad Stretch Limitations prone with rope   Gastroc Stretch 3 reps;30 seconds;Limitations   Gastroc Stretch Limitations slant board   Knee/Hip Exercises: Standing   Heel Raises 2 sets;15 reps   Heel Raises Limitations Toe Raises 2x15            PT Short Term Goals - 04/22/14 1505    PT SHORT TERM GOAL #1   Title Patient will display increased hamstring mobility for a 90/90 hamstring test to at least 80 degrees so she can more easily bend over to touch toes   Baseline 04/22/14 : Rt  -32, Lt -31   Time 3   Period Weeks   Status  Partially Met   PT SHORT TERM GOAL #2   Title Patient will display bilateral ankle dorsiflexion of at least 5 degrees to be able to sit to stand from chair with improved mechanics/ease   Baseline 05/03/2014: AROM -14, PROM -8    Time 3   Period Weeks   Status Partially Met   PT SHORT TERM GOAL #3   Title Patient will display Elys test of >120 degrees knee flexion to more easily perform sit to stand   Baseline 2014/05/03: Rt 11 1/4 in, Lt 10 1/2 inch to glute   Time 3   Period Weeks   Status Partially Met   PT SHORT TERM GOAL #4   Title Patient will demosntrate increased hip abduction/adduction bilateral to WNL   Baseline Will assess next visit.    PT SHORT TERM GOAL #5   Title Patient will displays glut max/hamstring/hip abduction strength of >3/5 MMT bilateral so patient can perform sit to stand without UE support   Baseline 2014/05/03: Glut max Rt 3+, Lt 3  HS Rt 4+, Lt 4+   Glut Med Rt 4-, Lt 3   Time 3   Period Weeks   Status Partially Met          PT Long Term Goals - 05/03/14 1508    PT LONG TERM GOAL #1   Title Patient will displays glut max strength of >3+/5 MMT bilateral so patient can perform 5x sit to stand without UE support in < 15 seconds   Time 4   Period Weeks   Status On-going   PT LONG TERM GOAL #2   Title Patient will display hamstring strength >4/5 MMT so patient can ambulate at > 1.2 m/s to return to community ambulatign status   Time 4   Status Achieved   PT LONG TERM GOAL #3   Title Patient will display increased hip abduction strength of > 3+/5 so patient can perform single leg stand >3 seconds indicating improved balance.     Time 4   Period Weeks   Status On-going   PT LONG TERM GOAL #4   Title improve BERG balance scale to 45/56   Baseline Revised on May 03, 2014 : 40   Time 4   Period Weeks   Status Revised          Plan - 05-03-2014 1510    Clinical Impression Statement Re-evaluation completed, with  updated Berg Balance, flexibility and strength.  Will assess ROM and 6 minute walk test next visit.   Pt demonstrates slow, steady improvements in all regions, and is progressing in ability to perform exercises in clinic with decreasing assist.  Pt does fatigue quickly and requires seated rest breaks or increased assist by end of treatment session; pt/family report completion of HEP and wakling program as able.   Recommend continued PT 2x/wk for 4 weeks to address remaining goals.     Pt will benefit from skilled therapeutic intervention in order to improve on the following deficits Abnormal gait;Decreased endurance;Decreased activity tolerance;Decreased balance;Decreased mobility;Decreased strength;Difficulty walking;Impaired flexibility   Rehab Potential Fair   PT Frequency 2x / week   PT Duration 4 weeks   PT Treatment/Interventions Neuromuscular re-education;Gait training;Stair training;Functional mobility training;Therapeutic activities;Therapeutic exercise;Balance training   PT Next Visit Plan Assess ROM and complete 6 minute walk test to complete full re-evaluation assessment.            G-Codes - May 03, 2014 1515    Functional Assessment Tool Used FOTO  Functional Limitation Mobility: Walking and moving around   Mobility: Walking and Moving Around Current Status [phone]) At least 40 percent but less than 60 percent impaired, limited or restricted   Mobility: Walking and Moving Around Goal Status [phone]) At least 40 percent but less than 60 percent impaired, limited or restricted        Problem List Patient Active Problem List   Diagnosis Date Noted  . Muscle weakness (generalized) 02/22/2014  . Stiffness of left knee 02/22/2014  . Hamstring tightness of left lower extremity 02/22/2014  . Pain in joint, pelvic region and thigh 02/22/2014  . Difficulty walking 04/07/2013  . Osteoporosis, unspecified 03/10/2013  . Anemia 03/10/2013  . Mild malnutrition 03/10/2013  . S/P left hip  hemiarthroplasty 12/30/2012  . Osteoporosis with pathological fracture with delayed healing 11/25/2012  . Pedal edema 10/27/2012  . Fracture of sacrum 10/02/2012  . Stricture and stenosis of esophagus 09/24/2011  . HYPERLIPIDEMIA-MIXED 11/03/2008  . CAD, UNSPECIFIED SITE 11/03/2008   Lonna Cobb, DPT [phone]

## 2014-04-26 ENCOUNTER — Other Ambulatory Visit: Payer: Self-pay | Admitting: Family Medicine

## 2014-04-26 ENCOUNTER — Ambulatory Visit (HOSPITAL_COMMUNITY)
Admission: RE | Admit: 2014-04-26 | Discharge: 2014-04-26 | Disposition: A | Discharge status: Home / Self Care | Payer: Medicare Other | Source: Ambulatory Visit | Attending: Orthopedic Surgery | Admitting: Orthopedic Surgery

## 2014-04-26 DIAGNOSIS — M25662 Stiffness of left knee, not elsewhere classified: Secondary | ICD-10-CM

## 2014-04-26 DIAGNOSIS — M6281 Muscle weakness (generalized): Secondary | ICD-10-CM

## 2014-04-26 DIAGNOSIS — M6289 Other specified disorders of muscle: Secondary | ICD-10-CM

## 2014-04-26 DIAGNOSIS — Z471 Aftercare following joint replacement surgery: Secondary | ICD-10-CM | POA: Diagnosis not present

## 2014-04-26 DIAGNOSIS — M25552 Pain in left hip: Secondary | ICD-10-CM

## 2014-04-26 NOTE — Therapy (Signed)
[facility] [address] [place], [place], [address] Phone: [phone]   Fax:  [phone]  Physical Therapy Treatment  Patient Details  Name: Samantha Hudson MRN: [id number] Date of Birth: Oct 30, 1934  Encounter Date: 04/26/2014      PT End of Session - 04/26/14 1621    Visit Number 23   Number of Visits 30   Date for PT Re-Evaluation 05/21/14   Authorization Type medicare   Authorization Time Period G-code updated visit 13   Authorization - Visit Number 23   Authorization - Number of Visits 30   PT Start Time 7858   PT Stop Time 1345   PT Time Calculation (min) 42 min   PT Charge Details TE 1303-1338, ROM Measurement x 1.   Equipment Utilized During Treatment Gait belt   Activity Tolerance Patient tolerated treatment well   Behavior During Therapy WFL for tasks assessed/performed      Past Medical History  Diagnosis Date  . Hyperlipemia   . IBS (irritable bowel syndrome)   . Osteoporosis   . Fibromyalgia   . Glaucoma     HAD LASER SURGERY - NO EYE DROPS REQUIRED  . Globus hystericus   . Fibromyalgia   . Meningioma   . CAD (coronary artery disease)     HEART STENT PLACED ABOUT 2000- NO LONGER SEES CARDIOLOGIST; NO C/O OF CHEST PAINS OR SOB  . Heart murmur     MVP SINCE THE 60'S - TAKES ATENOLOL -  . Anxiety   . Swelling     BILATERAL FEET AND LEGS - ONSET OF SWELLING APRIL 2014  . GERD (gastroesophageal reflux disease)   . Arthritis   . Fracture   . CVA (cerebral vascular accident)     STROKE 7 YRS AGO - DAY AFTER BRAIN SURGERY TO REMOVE BENIGN MENINGIOMA - HAS LEFT SIDED WEAKNESS AND A LITTLE NUMBNESS  . Pain     "EXCRUCIATING PAIN" LEFT HIP - HAS A FRACTURE - IS CONFINED TO W/C AT HOME - NO WEIGHT BEARING LEFT HIP.    Past Surgical History  Procedure Laterality Date  . Brain surgery      tumor removed  . Tonsillectomy    . Partial hysterectomy    . Glaucoma repair    . Cataract extraction    . Kidney surgery     left - HX ENLARGED LEFT KIDNEY ON XRAY - SURGERY WAS DONE ON URETER   . Colonoscopy    . Coronary angioplasty    . Hip arthroplasty Left 12/29/2012    Procedure: LEFT HIP HEMI ARTHROPLASTY ;  Surgeon: Mauri Pole, MD;  Location: [facility];  Service: Orthopedics;  Laterality: Left;    There were no vitals taken for this visit.  Visit Diagnosis:  Muscle weakness (generalized)  Stiffness of left knee  Hamstring tightness of left lower extremity  Pain in joint, pelvic region and thigh, left      Subjective Assessment - 04/26/14 1310    Symptoms Pt stated she is feeling good today, pain scale 4/10 Lt hip today.   Currently in Pain? Yes   Pain Score 4    Pain Location Hip   Pain Orientation Left          [facility] PT Assessment - 04/26/14 0001    AROM   Left Hip Flexion 65  was 95   Left Hip External Rotation  25  was 20   Left Hip Internal Rotation  48  was 15  Left Hip ABduction 23  was 20   Left Hip ADduction --  was 20   Left Knee Extension -7  was -25   Left Knee Flexion 132  was 125   Left Ankle Dorsiflexion -6  was -10   Ambulation/Gait   Ambulation/Gait Yes   6 Minute Walk- Baseline   6 Minute Walk- Baseline yes  735.50feet with RW          [facility] Adult PT Treatment/Exercise - 04/26/14 1348    Ambulation/Gait   Ambulation/Gait Yes   Lumbar Exercises: Stretches   Prone on Elbows Stretch 2 reps;60 seconds   Knee/Hip Exercises: Stretches   Active Hamstring Stretch 2 reps;30 seconds   Active Hamstring Stretch Limitations 14" step   Quad Stretch 3 reps;30 seconds;Limitations   Quad Stretch Limitations prone with rope   Hip Flexor Stretch 3 reps;30 seconds;Limitations   Hip Flexor Stretch Limitations 14in step   Gastroc Stretch 3 reps;30 seconds;Limitations   Gastroc Stretch Limitations slant board   Knee/Hip Exercises: Standing   Other Standing Knee Exercises 3D hip excursion with therapist facilitation to improve hip mobilty            PT Short  Term Goals - 04/26/14 1629    PT SHORT TERM GOAL #1   Title Patient will display increased hamstring mobility for a 90/90 hamstring test to at least 80 degrees so she can more easily bend over to touch toes   Status On-going   PT SHORT TERM GOAL #2   Title Patient will display bilateral ankle dorsiflexion of at least 5 degrees to be able to sit to stand from chair with improved mechanics/ease   Status On-going   PT SHORT TERM GOAL #3   Title Patient will display Elys test of >120 degrees knee flexion to more easily perform sit to stand   PT SHORT TERM GOAL #4   Title Patient will demosntrate increased hip abduction/adduction bilateral to WNL   PT SHORT TERM GOAL #5   Title Patient will displays glut max/hamstring/hip abduction strength of >3/5 MMT bilateral so patient can perform sit to stand without UE support          PT Long Term Goals - 04/26/14 1630    PT LONG TERM GOAL #1   Title Patient will displays glut max strength of >3+/5 MMT bilateral so patient can perform 5x sit to stand without UE support in < 15 seconds   Status On-going   PT LONG TERM GOAL #2   Title Patient will display hamstring strength >4/5 MMT so patient can ambulate at > 1.2 m/s to return to community ambulatign status   PT LONG TERM GOAL #3   Title Patient will display increased hip abduction strength of > 3+/5 so patient can perform single leg stand >3 seconds indicating improved balance.     PT LONG TERM GOAL #4   Title improve BERG balance scale to 45/56   Status On-going          Plan - 04/26/14 1623    Clinical Impression Statement 6 minute walk complete with RW, pt able to ambulate 735.8 feet in 6 minutes with no assistance or LOB episodes.  ROM measurement complete this session with noted improved ROM for all LE joints.  No reports of pain through session.   PT Plan Continue with current PT POC to improve mobiltiy, posture and strength to improve gait mechanics to increase efficiency of gait.  Continue secondary focus on improving squat depth  and strength for improved mobility.  Attempt walking drills using decreased AD and squats with 1 HHA next visit.  Begin standing high marches with 1 HHA to improve balance, posture and core strengthening next session to assist wtih scooting abilities without HHA.      Problem List Patient Active Problem List   Diagnosis Date Noted  . Muscle weakness (generalized) 02/22/2014  . Stiffness of left knee 02/22/2014  . Hamstring tightness of left lower extremity 02/22/2014  . Pain in joint, pelvic region and thigh 02/22/2014  . Difficulty walking 04/07/2013  . Osteoporosis, unspecified 03/10/2013  . Anemia 03/10/2013  . Mild malnutrition 03/10/2013  . S/P left hip hemiarthroplasty 12/30/2012  . Osteoporosis with pathological fracture with delayed healing 11/25/2012  . Pedal edema 10/27/2012  . Fracture of sacrum 10/02/2012  . Stricture and stenosis of esophagus 09/24/2011  . HYPERLIPIDEMIA-MIXED 11/03/2008  . CAD, UNSPECIFIED SITE 11/03/2008   Ihor Austin, [place] Aldona Lento 04/26/2014, 4:33 PM

## 2014-04-28 ENCOUNTER — Encounter (HOSPITAL_COMMUNITY): Payer: BC Managed Care – PPO | Admitting: Physical Therapy

## 2014-04-30 ENCOUNTER — Ambulatory Visit (HOSPITAL_COMMUNITY)
Admission: RE | Admit: 2014-04-30 | Discharge: 2014-04-30 | Disposition: A | Discharge status: Home / Self Care | Payer: Medicare Other | Source: Ambulatory Visit | Attending: Orthopedic Surgery | Admitting: Orthopedic Surgery

## 2014-04-30 ENCOUNTER — Encounter (HOSPITAL_COMMUNITY)
Admission: RE | Admit: 2014-04-30 | Discharge: 2014-04-30 | Disposition: A | Discharge status: Home / Self Care | Payer: Medicare Other | Source: Ambulatory Visit | Attending: Family Medicine | Admitting: Family Medicine

## 2014-04-30 DIAGNOSIS — M6281 Muscle weakness (generalized): Secondary | ICD-10-CM

## 2014-04-30 DIAGNOSIS — M6289 Other specified disorders of muscle: Secondary | ICD-10-CM

## 2014-04-30 DIAGNOSIS — M25552 Pain in left hip: Secondary | ICD-10-CM

## 2014-04-30 DIAGNOSIS — M810 Age-related osteoporosis without current pathological fracture: Secondary | ICD-10-CM | POA: Diagnosis not present

## 2014-04-30 DIAGNOSIS — Z471 Aftercare following joint replacement surgery: Secondary | ICD-10-CM | POA: Diagnosis not present

## 2014-04-30 DIAGNOSIS — M25662 Stiffness of left knee, not elsewhere classified: Secondary | ICD-10-CM

## 2014-04-30 MED ORDER — ZOLEDRONIC ACID 5 MG/100ML IV SOLN
5.0000 mg | Freq: Once | INTRAVENOUS | Status: AC
  Administered 2014-04-30: 5 mg via INTRAVENOUS
  Filled 2014-04-30: qty 100

## 2014-04-30 NOTE — Therapy (Signed)
[facility] [address] [place], [place], [address] Phone: [phone]   Fax:  [phone]  Physical Therapy Treatment  Patient Details  Name: Samantha Hudson MRN: [id number] Date of Birth: 1935/01/22  Encounter Date: 04/30/2014      PT End of Session - 04/30/14 1157    Visit Number 24   Number of Visits 30   Date for PT Re-Evaluation 05/21/14   Authorization Type medicare   Authorization - Visit Number 24   Authorization - Number of Visits 30   PT Start Time 1110   PT Stop Time 1148   PT Time Calculation (min) 38 min   Equipment Utilized During Treatment Gait belt   Activity Tolerance Patient tolerated treatment well   Behavior During Therapy [facility] for tasks assessed/performed      Past Medical History  Diagnosis Date  . Hyperlipemia   . IBS (irritable bowel syndrome)   . Osteoporosis   . Fibromyalgia   . Glaucoma     HAD LASER SURGERY - NO EYE DROPS REQUIRED  . Globus hystericus   . Fibromyalgia   . Meningioma   . CAD (coronary artery disease)     HEART STENT PLACED ABOUT 2000- NO LONGER SEES CARDIOLOGIST; NO C/O OF CHEST PAINS OR SOB  . Heart murmur     MVP SINCE THE 60'S - TAKES ATENOLOL -  . Anxiety   . Swelling     BILATERAL FEET AND LEGS - ONSET OF SWELLING APRIL 2014  . GERD (gastroesophageal reflux disease)   . Arthritis   . Fracture   . CVA (cerebral vascular accident)     STROKE 7 YRS AGO - DAY AFTER BRAIN SURGERY TO REMOVE BENIGN MENINGIOMA - HAS LEFT SIDED WEAKNESS AND A LITTLE NUMBNESS  . Pain     "EXCRUCIATING PAIN" LEFT HIP - HAS A FRACTURE - IS CONFINED TO W/C AT HOME - NO WEIGHT BEARING LEFT HIP.    Past Surgical History  Procedure Laterality Date  . Brain surgery      tumor removed  . Tonsillectomy    . Partial hysterectomy    . Glaucoma repair    . Cataract extraction    . Kidney surgery      left - HX ENLARGED LEFT KIDNEY ON XRAY - SURGERY WAS DONE ON URETER   . Colonoscopy    . Coronary angioplasty     . Hip arthroplasty Left 12/29/2012    Procedure: LEFT HIP HEMI ARTHROPLASTY ;  Surgeon: Mauri Pole, MD;  Location: [facility];  Service: Orthopedics;  Laterality: Left;    There were no vitals taken for this visit.  Visit Diagnosis:  Muscle weakness (generalized)  Stiffness of left knee  Hamstring tightness of left lower extremity  Pain in joint, pelvic region and thigh, left      Subjective Assessment - 04/30/14 1151    Symptoms Pt states that she is doing failrly well; states she is trying to do her exercises at home.    Currently in Pain? Yes   Pain Score 5    Pain Location Hip   Pain Orientation Left   Pain Descriptors / Indicators Aching            [facility] Adult PT Treatment/Exercise - 04/30/14 1152    Exercises   Exercises Balance   Balance Exercises   Rotation with Cones 1 time    March on Foam/Wedge 10 reps;Limitations   March on Foam/Wedge Limitations no foam  SLS Limitations sit to stand no UE assist x 10    Tandem Stance Eyes open;2 reps;30 secs   Stand without Upper Extremity Support with B UE flexion x s10   Heel Raises 10 reps   Toe Raise 10 reps   Toe Raise Limitations one at a time             PT Short Term Goals - 04/30/14 1201    PT SHORT TERM GOAL #1   Title Patient will display increased hamstring mobility for a 90/90 hamstring test to at least 80 degrees so she can more easily bend over to touch toes   Baseline 04/22/14 : Rt -32, Lt -31   Time 3   Period Weeks   Status On-going   PT SHORT TERM GOAL #2   Title Patient will display bilateral ankle dorsiflexion of at least 5 degrees to be able to sit to stand from chair with improved mechanics/ease   Baseline 04/22/14: AROM -14, PROM -8    Time 3   Period Weeks   Status On-going   PT SHORT TERM GOAL #3   Title Patient will display Elys test of >120 degrees knee flexion to more easily perform sit to stand   Baseline 04/22/14: Rt 11 1/4 in, Lt 10 1/2 inch to glute   Time 3   Period  Weeks   Status Partially Met   PT SHORT TERM GOAL #4   Title Patient will demosntrate increased hip abduction/adduction bilateral to WNL   Baseline Will assess next visit.    PT SHORT TERM GOAL #5   Title Patient will displays glut max/hamstring/hip abduction strength of >3/5 MMT bilateral so patient can perform sit to stand without UE support   Baseline 04/22/14: Glut max Rt 3+, Lt 3  HS Rt 4+, Lt 4+   Glut Med Rt 4-, Lt 3   Time 3   Period Weeks   Status Partially Met            Plan - 04/30/14 1158    Clinical Impression Statement Todays treatment focused on posture and balanace.  Pt able to complete new exercise with therapist facilitation.  PT  improving in all aspects but continues to have significant balance issues. Added cone rotation to address balance as well as ROM   Rehab Potential Fair   PT Frequency 2x / week   PT Duration 4 weeks   PT Treatment/Interventions Neuromuscular re-education;Gait training;Stair training;Functional mobility training;Therapeutic activities;Therapeutic exercise;Balance training   PT Next Visit Plan begin wall bumps .     PT Plan Continue with current POC to improve mobiity, strength and balance.         Problem List Patient Active Problem List   Diagnosis Date Noted  . Muscle weakness (generalized) 02/22/2014  . Stiffness of left knee 02/22/2014  . Hamstring tightness of left lower extremity 02/22/2014  . Pain in joint, pelvic region and thigh 02/22/2014  . Difficulty walking 04/07/2013  . Osteoporosis, unspecified 03/10/2013  . Anemia 03/10/2013  . Mild malnutrition 03/10/2013  . S/P left hip hemiarthroplasty 12/30/2012  . Osteoporosis with pathological fracture with delayed healing 11/25/2012  . Pedal edema 10/27/2012  . Fracture of sacrum 10/02/2012  . Stricture and stenosis of esophagus 09/24/2011  . HYPERLIPIDEMIA-MIXED 11/03/2008  . CAD, UNSPECIFIED SITE 11/03/2008  Azucena Freed PT/CLT [phone]  04/30/2014, 12:02  PM

## 2014-05-04 ENCOUNTER — Ambulatory Visit (HOSPITAL_COMMUNITY)
Admission: RE | Admit: 2014-05-04 | Discharge: 2014-05-04 | Disposition: A | Discharge status: Home / Self Care | Payer: Medicare Other | Source: Ambulatory Visit | Attending: Orthopedic Surgery | Admitting: Orthopedic Surgery

## 2014-05-04 DIAGNOSIS — M6281 Muscle weakness (generalized): Secondary | ICD-10-CM

## 2014-05-04 DIAGNOSIS — M6289 Other specified disorders of muscle: Secondary | ICD-10-CM

## 2014-05-04 DIAGNOSIS — M25662 Stiffness of left knee, not elsewhere classified: Secondary | ICD-10-CM

## 2014-05-04 DIAGNOSIS — Z471 Aftercare following joint replacement surgery: Secondary | ICD-10-CM | POA: Diagnosis not present

## 2014-05-04 DIAGNOSIS — M25552 Pain in left hip: Secondary | ICD-10-CM

## 2014-05-04 NOTE — Therapy (Signed)
[facility] [address] [place], [place], [address] Phone: [phone]   Fax:  [phone]  Physical Therapy Treatment  Patient Details  Name: Samantha Hudson MRN: [id number] Date of Birth: 1934/05/23  Encounter Date: 05/04/2014      PT End of Session - 05/04/14 1357    Visit Number 25   Number of Visits 30   Date for PT Re-Evaluation 05/21/14   Authorization Type medicare   Authorization Time Period G-code updated visit 30   Authorization - Visit Number 23   Authorization - Number of Visits 30   PT Start Time 1304   PT Stop Time 1345   PT Time Calculation (min) 41 min      Past Medical History  Diagnosis Date  . Hyperlipemia   . IBS (irritable bowel syndrome)   . Osteoporosis   . Fibromyalgia   . Glaucoma     HAD LASER SURGERY - NO EYE DROPS REQUIRED  . Globus hystericus   . Fibromyalgia   . Meningioma   . CAD (coronary artery disease)     HEART STENT PLACED ABOUT 2000- NO LONGER SEES CARDIOLOGIST; NO C/O OF CHEST PAINS OR SOB  . Heart murmur     MVP SINCE THE 60'S - TAKES ATENOLOL -  . Anxiety   . Swelling     BILATERAL FEET AND LEGS - ONSET OF SWELLING APRIL 2014  . GERD (gastroesophageal reflux disease)   . Arthritis   . Fracture   . CVA (cerebral vascular accident)     STROKE 7 YRS AGO - DAY AFTER BRAIN SURGERY TO REMOVE BENIGN MENINGIOMA - HAS LEFT SIDED WEAKNESS AND A LITTLE NUMBNESS  . Pain     "EXCRUCIATING PAIN" LEFT HIP - HAS A FRACTURE - IS CONFINED TO W/C AT HOME - NO WEIGHT BEARING LEFT HIP.    Past Surgical History  Procedure Laterality Date  . Brain surgery      tumor removed  . Tonsillectomy    . Partial hysterectomy    . Glaucoma repair    . Cataract extraction    . Kidney surgery      left - HX ENLARGED LEFT KIDNEY ON XRAY - SURGERY WAS DONE ON URETER   . Colonoscopy    . Coronary angioplasty    . Hip arthroplasty Left 12/29/2012    Procedure: LEFT HIP HEMI ARTHROPLASTY ;  Surgeon: Mauri Pole,  MD;  Location: [facility];  Service: Orthopedics;  Laterality: Left;    There were no vitals taken for this visit.  Visit Diagnosis:  Muscle weakness (generalized)  Stiffness of left knee  Hamstring tightness of left lower extremity  Pain in joint, pelvic region and thigh, left      Subjective Assessment - 05/04/14 1316    Symptoms Patient notes increased pin in Lt hip today secondary to phsyical activity over the weekend. Patient is otherwise not sure why her hip is bothering her.     Currently in Pain? Yes   Pain Score 6    Pain Location Hip   Pain Orientation Left   Pain Descriptors / Indicators Aching   Pain Radiating Towards Greater trochanter area to anterior thigh            [facility] Adult PT Treatment/Exercise - 05/04/14 0001    Lumbar Exercises: Standing   Other Standing Lumbar Exercises Lunge matrix common and uncommo no pivots.    Knee/Hip Exercises: Standing   Heel Raises 2 sets;15 reps  Heel Raises Limitations Toe Raises 2x15 1 HHA   Functional Squat Limitations 20x   Other Standing Knee Exercises 3D hip excursion 10x,   Other Standing Knee Exercises  standing balance reactions to anterior, posterior and lateral pushing. 20x eeach    Balance Exercises   Rotation with Cones 2 time 10 cones   March on Foam/Wedge 10 reps;Limitations   March on Foam/Wedge Limitations no foam           PT Education - 05/04/14 1356    Education provided Yes   Education Details Multiplanar stepping at [employer] for balance.    Person(s) Educated Patient;Spouse   Methods Explanation;Demonstration;Handout   Comprehension Verbalized understanding;Returned demonstration          PT Short Term Goals - 05/04/14 1403    PT SHORT TERM GOAL #1   Title Patient will display increased hamstring mobility for a 90/90 hamstring test to at least 80 degrees so she can more easily bend over to touch toes   Baseline 04/22/14 : Rt -32, Lt -31   Time 3   Period Weeks   Status On-going    PT SHORT TERM GOAL #2   Title Patient will display bilateral ankle dorsiflexion of at least 5 degrees to be able to sit to stand from chair with improved mechanics/ease   Baseline 04/22/14: AROM -14, PROM -8    Time 3   Period Weeks   Status On-going   PT SHORT TERM GOAL #3   Title Patient will display Elys test of >120 degrees knee flexion to more easily perform sit to stand   Baseline 04/22/14: Rt 11 1/4 in, Lt 10 1/2 inch to glute   Time 3   Period Weeks   Status Partially Met   PT SHORT TERM GOAL #4   Title Patient will demosntrate increased hip abduction/adduction bilateral to WNL   Baseline Will assess next visit.    PT SHORT TERM GOAL #5   Title Patient will displays glut max/hamstring/hip abduction strength of >3/5 MMT bilateral so patient can perform sit to stand without UE support   Baseline 04/22/14: Glut max Rt 3+, Lt 3  HS Rt 4+, Lt 4+   Glut Med Rt 4-, Lt 3   Time 3   Period Weeks   Status Partially Met          PT Long Term Goals - 05/04/14 1403    PT LONG TERM GOAL #1   Title Patient will displays glut max strength of >3+/5 MMT bilateral so patient can perform 5x sit to stand without UE support in < 15 seconds   Status On-going   PT LONG TERM GOAL #2   Title Patient will display hamstring strength >4/5 MMT so patient can ambulate at > 1.2 m/s to return to community ambulatign status   PT LONG TERM GOAL #3   Title Patient will display increased hip abduction strength of > 3+/5 so patient can perform single leg stand >3 seconds indicating improved balance.     PT LONG TERM GOAL #4   Title improve BERG balance scale to 45/56   Status On-going          Plan - 05/04/14 1358    Clinical Impression Statement Todays treatment continuesd to focus on improvign LE strength and overalll coordination, attempted to retrain righting responses by having patient perform standing balance with therapist pushing on patient in the sagittal and frontal plane to increased  righting response; difficulty notivced in all  direction with most difficutly in the saggital plane when given an anterior to posterior push likely attributed to weak abdominal muscles.    PT Next Visit Plan begin butt driven wall bumps and educate patient on performance for HEP.         Problem List Patient Active Problem List   Diagnosis Date Noted  . Muscle weakness (generalized) 02/22/2014  . Stiffness of left knee 02/22/2014  . Hamstring tightness of left lower extremity 02/22/2014  . Pain in joint, pelvic region and thigh 02/22/2014  . Difficulty walking 04/07/2013  . Osteoporosis, unspecified 03/10/2013  . Anemia 03/10/2013  . Mild malnutrition 03/10/2013  . S/P left hip hemiarthroplasty 12/30/2012  . Osteoporosis with pathological fracture with delayed healing 11/25/2012  . Pedal edema 10/27/2012  . Fracture of sacrum 10/02/2012  . Stricture and stenosis of esophagus 09/24/2011  . HYPERLIPIDEMIA-MIXED 11/03/2008  . CAD, UNSPECIFIED SITE 11/03/2008    Devona Konig PT DPT [phone]

## 2014-05-06 ENCOUNTER — Ambulatory Visit (HOSPITAL_COMMUNITY): Payer: Medicare Other | Admitting: Physical Therapy

## 2014-05-07 ENCOUNTER — Ambulatory Visit (HOSPITAL_COMMUNITY)
Admission: RE | Admit: 2014-05-07 | Discharge: 2014-05-07 | Disposition: A | Discharge status: Home / Self Care | Payer: Medicare Other | Source: Ambulatory Visit | Attending: Family Medicine | Admitting: Family Medicine

## 2014-05-07 DIAGNOSIS — M25662 Stiffness of left knee, not elsewhere classified: Secondary | ICD-10-CM

## 2014-05-07 DIAGNOSIS — M6289 Other specified disorders of muscle: Secondary | ICD-10-CM

## 2014-05-07 DIAGNOSIS — M25552 Pain in left hip: Secondary | ICD-10-CM

## 2014-05-07 DIAGNOSIS — Z471 Aftercare following joint replacement surgery: Secondary | ICD-10-CM | POA: Diagnosis not present

## 2014-05-07 DIAGNOSIS — M6281 Muscle weakness (generalized): Secondary | ICD-10-CM

## 2014-05-07 NOTE — Therapy (Signed)
[facility] [place], [place], [address] Phone: [phone]   Fax:  [phone]  Physical Therapy Treatment  Patient Details  Name: Samantha Hudson MRN: [id number] Date of Birth: [birth year]  Encounter Date: 05/07/2014      PT End of Session - 05/07/14 1320    Visit Number 26   Number of Visits 30   Date for PT Re-Evaluation 05/21/14   Authorization Type medicare   Authorization Time Period G-code updated visit 49   Authorization - Visit Number 26   Authorization - Number of Visits 30   PT Start Time 1150   PT Stop Time 1229   PT Time Calculation (min) 39 min   Equipment Utilized During Treatment Gait belt   Activity Tolerance Patient tolerated treatment well   Behavior During Therapy WFL for tasks assessed/performed      Past Medical History  Diagnosis Date  . Hyperlipemia   . IBS (irritable bowel syndrome)   . Osteoporosis   . Fibromyalgia   . Glaucoma     HAD LASER SURGERY - NO EYE DROPS REQUIRED  . Globus hystericus   . Fibromyalgia   . Meningioma   . CAD (coronary artery disease)     HEART STENT PLACED ABOUT 2000- NO LONGER SEES CARDIOLOGIST; NO C/O OF CHEST PAINS OR SOB  . Heart murmur     MVP SINCE THE 60'S - TAKES ATENOLOL -  . Anxiety   . Swelling     BILATERAL FEET AND LEGS - ONSET OF SWELLING APRIL 2014  . GERD (gastroesophageal reflux disease)   . Arthritis   . Fracture   . CVA (cerebral vascular accident)     STROKE 7 YRS AGO - DAY AFTER BRAIN SURGERY TO REMOVE BENIGN MENINGIOMA - HAS LEFT SIDED WEAKNESS AND A LITTLE NUMBNESS  . Pain     "EXCRUCIATING PAIN" LEFT HIP - HAS A FRACTURE - IS CONFINED TO W/C AT HOME - NO WEIGHT BEARING LEFT HIP.    Past Surgical History  Procedure Laterality Date  . Brain surgery      tumor removed  . Tonsillectomy    . Partial hysterectomy    . Glaucoma repair    . Cataract extraction    . Kidney surgery      left - HX ENLARGED LEFT KIDNEY ON XRAY -  SURGERY WAS DONE ON URETER   . Colonoscopy    . Coronary angioplasty    . Hip arthroplasty Left 12/29/2012    Procedure: LEFT HIP HEMI ARTHROPLASTY ;  Surgeon: Mauri Pole, MD;  Location: [facility];  Service: Orthopedics;  Laterality: Left;    There were no vitals taken for this visit.  Visit Diagnosis:  Muscle weakness (generalized)  Stiffness of left knee  Hamstring tightness of left lower extremity  Pain in joint, pelvic region and thigh, left      Subjective Assessment - 05/07/14 1320    Symptoms Pt reports she has been trying to weight shifting and reaching activities in the kitchen while reaching into her cupboards at home working on balance.    Currently in Pain? No/denies                    [facility] Adult PT Treatment/Exercise - 05/07/14 1153    Exercises   Exercises Balance;Knee/Hip   Lumbar Exercises: Standing   Other Standing Lumbar Exercises Lunge matrix common and uncommo no pivots.    Knee/Hip Exercises: Stretches  Active Hamstring Stretch 2 reps;30 seconds   Active Hamstring Stretch Limitations 14" step   Gastroc Stretch 3 reps;30 seconds;Limitations   Gastroc Stretch Limitations slant board   Knee/Hip Exercises: Standing   Heel Raises 2 sets;15 reps   Heel Raises Limitations Toe Raises 2x15 1 HHA   Functional Squat Limitations 20x   Gait Training Sidestepping 10 feet x3   Other Standing Knee Exercises 3D hip excursion 10x,   Balance Exercises   Wall Bumps 20 reps   Wall Bumps Limitations Hips, feet 10in from wall with min assist from PT                  PT Short Term Goals - 05/07/14 1325    PT SHORT TERM GOAL #1   Title Patient will display increased hamstring mobility for a 90/90 hamstring test to at least 80 degrees so she can more easily bend over to touch toes   Status On-going   PT SHORT TERM GOAL #2   Title Patient will display bilateral ankle dorsiflexion of at least 5 degrees to be able to sit to stand from chair with  improved mechanics/ease   Status On-going   PT SHORT TERM GOAL #3   Title Patient will display Elys test of >120 degrees knee flexion to more easily perform sit to stand   Status On-going   PT SHORT TERM GOAL #4   Title Patient will demosntrate increased hip abduction/adduction bilateral to WNL   Status On-going   PT SHORT TERM GOAL #5   Title Patient will displays glut max/hamstring/hip abduction strength of >3/5 MMT bilateral so patient can perform sit to stand without UE support   Status On-going           PT Long Term Goals - 05/07/14 1325    PT LONG TERM GOAL #1   Title Patient will displays glut max strength of >3+/5 MMT bilateral so patient can perform 5x sit to stand without UE support in < 15 seconds   Status On-going               Plan - 05/07/14 1321    Clinical Impression Statement Continued balance and weight shifting program to improve gait pattern and decrease risk of falls.  Added hip wall bumps, with pt being able to move heels 10 inches from wall at max to complete exercise requiring min assist from therapist to fully transfer weight forward back over body.  Pt did have difficulty with lunging activity on the Rt LE, requiring therapist to assist with shifting weight onto Rt LE in all planes of motion along with verbal cuees for encouragement to increased step length.    Pt will benefit from skilled therapeutic intervention in order to improve on the following deficits Abnormal gait;Decreased endurance;Decreased activity tolerance;Decreased balance;Decreased mobility;Decreased strength;Difficulty walking;Impaired flexibility   Rehab Potential Fair   PT Frequency 2x / week   PT Duration 4 weeks   PT Treatment/Interventions Neuromuscular re-education;Gait training;Stair training;Functional mobility training;Therapeutic activities;Therapeutic exercise;Balance training   PT Next Visit Plan Continue balance program, working on safety with gait with weight shifting,  change in direction, etc.         Problem List Patient Active Problem List   Diagnosis Date Noted  . Muscle weakness (generalized) 02/22/2014  . Stiffness of left knee 02/22/2014  . Hamstring tightness of left lower extremity 02/22/2014  . Pain in joint, pelvic region and thigh 02/22/2014  . Difficulty walking 04/07/2013  . Osteoporosis, unspecified 03/10/2013  .  Anemia 03/10/2013  . Mild malnutrition 03/10/2013  . S/P left hip hemiarthroplasty 12/30/2012  . Osteoporosis with pathological fracture with delayed healing 11/25/2012  . Pedal edema 10/27/2012  . Fracture of sacrum 10/02/2012  . Stricture and stenosis of esophagus 09/24/2011  . HYPERLIPIDEMIA-MIXED 11/03/2008  . CAD, UNSPECIFIED SITE 11/03/2008   Lonna Cobb, DPT [phone]  [facility] [address] [place], [place], [address] Phone: [phone]   Fax:  [phone]

## 2014-05-10 ENCOUNTER — Ambulatory Visit (HOSPITAL_COMMUNITY)
Admission: RE | Admit: 2014-05-10 | Discharge: 2014-05-10 | Disposition: A | Discharge status: Home / Self Care | Payer: Medicare Other | Source: Ambulatory Visit | Attending: Family Medicine | Admitting: Family Medicine

## 2014-05-10 DIAGNOSIS — M6289 Other specified disorders of muscle: Secondary | ICD-10-CM

## 2014-05-10 DIAGNOSIS — Z471 Aftercare following joint replacement surgery: Secondary | ICD-10-CM | POA: Diagnosis not present

## 2014-05-10 DIAGNOSIS — M25552 Pain in left hip: Secondary | ICD-10-CM

## 2014-05-10 DIAGNOSIS — M6281 Muscle weakness (generalized): Secondary | ICD-10-CM

## 2014-05-10 DIAGNOSIS — M25662 Stiffness of left knee, not elsewhere classified: Secondary | ICD-10-CM

## 2014-05-10 NOTE — Therapy (Signed)
[facility] [place], [place], [address] Phone: [phone]   Fax:  [phone]  Physical Therapy Treatment  Patient Details  Name: Samantha Hudson MRN: [id number] Date of Birth: 12/25/1934  Encounter Date: 05/10/2014      PT End of Session - 05/10/14 1351    Visit Number 27   Number of Visits 30   Date for PT Re-Evaluation 05/21/14   Authorization Type medicare   Authorization Time Period G-code updated visit 16   Authorization - Visit Number 27   Authorization - Number of Visits 30   PT Start Time 9935   PT Stop Time 1430   PT Time Calculation (min) 41 min   Activity Tolerance Patient tolerated treatment well   Behavior During Therapy [facility] for tasks assessed/performed      Past Medical History  Diagnosis Date  . Hyperlipemia   . IBS (irritable bowel syndrome)   . Osteoporosis   . Fibromyalgia   . Glaucoma     HAD LASER SURGERY - NO EYE DROPS REQUIRED  . Globus hystericus   . Fibromyalgia   . Meningioma   . CAD (coronary artery disease)     HEART STENT PLACED ABOUT 2000- NO LONGER SEES CARDIOLOGIST; NO C/O OF CHEST PAINS OR SOB  . Heart murmur     MVP SINCE THE 60'S - TAKES ATENOLOL -  . Anxiety   . Swelling     BILATERAL FEET AND LEGS - ONSET OF SWELLING APRIL 2014  . GERD (gastroesophageal reflux disease)   . Arthritis   . Fracture   . CVA (cerebral vascular accident)     STROKE 7 YRS AGO - DAY AFTER BRAIN SURGERY TO REMOVE BENIGN MENINGIOMA - HAS LEFT SIDED WEAKNESS AND A LITTLE NUMBNESS  . Pain     "EXCRUCIATING PAIN" LEFT HIP - HAS A FRACTURE - IS CONFINED TO W/C AT HOME - NO WEIGHT BEARING LEFT HIP.    Past Surgical History  Procedure Laterality Date  . Brain surgery      tumor removed  . Tonsillectomy    . Partial hysterectomy    . Glaucoma repair    . Cataract extraction    . Kidney surgery      left - HX ENLARGED LEFT KIDNEY ON XRAY - SURGERY WAS DONE ON URETER   . Colonoscopy    .  Coronary angioplasty    . Hip arthroplasty Left 12/29/2012    Procedure: LEFT HIP HEMI ARTHROPLASTY ;  Surgeon: Mauri Pole, MD;  Location: [facility];  Service: Orthopedics;  Laterality: Left;    There were no vitals taken for this visit.  Visit Diagnosis:  Muscle weakness (generalized)  Stiffness of left knee  Hamstring tightness of left lower extremity  Pain in joint, pelvic region and thigh, left      Subjective Assessment - 05/10/14 1354    Symptoms Patient reports that she feels her balance is getting better but notes contiued difficulty sequencign and initialting certain standing tasks.    Currently in Pain? No/denies           [facility] Adult PT Treatment/Exercise - 05/10/14 0001    Knee/Hip Exercises: Aerobic   Stationary Bike Nu step 70min for sequencign of gait   Knee/Hip Exercises: Standing   Heel Raises 2 sets;15 reps   Heel Raises Limitations Toe Raises 2x15 1 HHA   Forward Lunges 10 reps   Side Lunges 10 reps   Functional Squat Limitations  10x with min to mod assist   Gait Training Sidestepping, long strides, wide strides, toes in toes out, marching, butt kicks.  10 feet x3   Other Standing Knee Exercises 3D hip excursion 10x,          PT Short Term Goals - 05/07/14 1325    PT SHORT TERM GOAL #1   Title Patient will display increased hamstring mobility for a 90/90 hamstring test to at least 80 degrees so she can more easily bend over to touch toes   Status On-going   PT SHORT TERM GOAL #2   Title Patient will display bilateral ankle dorsiflexion of at least 5 degrees to be able to sit to stand from chair with improved mechanics/ease   Status On-going   PT SHORT TERM GOAL #3   Title Patient will display Elys test of >120 degrees knee flexion to more easily perform sit to stand   Status On-going   PT SHORT TERM GOAL #4   Title Patient will demosntrate increased hip abduction/adduction bilateral to WNL   Status On-going   PT SHORT TERM GOAL #5   Title  Patient will displays glut max/hamstring/hip abduction strength of >3/5 MMT bilateral so patient can perform sit to stand without UE support   Status On-going           PT Long Term Goals - 05/07/14 1325    PT LONG TERM GOAL #1   Title Patient will displays glut max strength of >3+/5 MMT bilateral so patient can perform 5x sit to stand without UE support in < 15 seconds   Status On-going            Plan - 05/10/14 1408    Clinical Impression Statement Patint displays quickness to fatigue, decreased coordination this session, resulting in difficulty performing sit to stand independently as patient is unable to bend her knee during stand to sit. Patient performed dynamic gait activities well, but was unable to significaty increase her pace when asked.  Patinet also demonstrated limited activity tolerance as she was uanble to perform to nustep with >50 steps per minuet without resting    PT Next Visit Plan Continue balance program, working on safety with gait with weight shifting, change in direction, etc.         Problem List Patient Active Problem List   Diagnosis Date Noted  . Muscle weakness (generalized) 02/22/2014  . Stiffness of left knee 02/22/2014  . Hamstring tightness of left lower extremity 02/22/2014  . Pain in joint, pelvic region and thigh 02/22/2014  . Difficulty walking 04/07/2013  . Osteoporosis, unspecified 03/10/2013  . Anemia 03/10/2013  . Mild malnutrition 03/10/2013  . S/P left hip hemiarthroplasty 12/30/2012  . Osteoporosis with pathological fracture with delayed healing 11/25/2012  . Pedal edema 10/27/2012  . Fracture of sacrum 10/02/2012  . Stricture and stenosis of esophagus 09/24/2011  . HYPERLIPIDEMIA-MIXED 11/03/2008  . CAD, UNSPECIFIED SITE 11/03/2008    Devona Konig PT DPT [phone]

## 2014-05-12 ENCOUNTER — Ambulatory Visit (HOSPITAL_COMMUNITY)
Admission: RE | Admit: 2014-05-12 | Discharge: 2014-05-12 | Disposition: A | Discharge status: Home / Self Care | Payer: Medicare Other | Source: Ambulatory Visit | Attending: Family Medicine | Admitting: Family Medicine

## 2014-05-12 ENCOUNTER — Encounter (HOSPITAL_COMMUNITY): Payer: Self-pay

## 2014-05-12 DIAGNOSIS — Z471 Aftercare following joint replacement surgery: Secondary | ICD-10-CM | POA: Diagnosis not present

## 2014-05-12 DIAGNOSIS — M25662 Stiffness of left knee, not elsewhere classified: Secondary | ICD-10-CM

## 2014-05-12 DIAGNOSIS — M6289 Other specified disorders of muscle: Secondary | ICD-10-CM

## 2014-05-12 DIAGNOSIS — M6281 Muscle weakness (generalized): Secondary | ICD-10-CM

## 2014-05-12 DIAGNOSIS — M25552 Pain in left hip: Secondary | ICD-10-CM

## 2014-05-12 NOTE — Therapy (Signed)
[place], [place], [address] Phone: [phone]   Fax:  [phone]  Physical Therapy Treatment  Patient Details  Name: Samantha Hudson MRN: [id number] Date of Birth: [birth year]  Encounter Date: 05/12/2014      PT End of Session - 05/12/14 1411    Visit Number 28   Number of Visits 30   Date for PT Re-Evaluation 05/21/14   Authorization Type medicare   Authorization Time Period G-code updated visit 21   Authorization - Visit Number 28   Authorization - Number of Visits 30   PT Start Time 1302   PT Stop Time 1352   PT Time Calculation (min) 50 min   PT Charge Details Gait 1302-1317, NMR [id number], TE [id number]   Equipment Utilized During Treatment Gait belt   Activity Tolerance Patient tolerated treatment well   Behavior During Therapy [facility] for tasks assessed/performed      Past Medical History  Diagnosis Date  . Hyperlipemia   . IBS (irritable bowel syndrome)   . Osteoporosis   . Fibromyalgia   . Glaucoma     HAD LASER SURGERY - NO EYE DROPS REQUIRED  . Globus hystericus   . Fibromyalgia   . Meningioma   . CAD (coronary artery disease)     HEART STENT PLACED ABOUT 2000- NO LONGER SEES CARDIOLOGIST; NO C/O OF CHEST PAINS OR SOB  . Heart murmur     MVP SINCE THE 60'S - TAKES ATENOLOL -  . Anxiety   . Swelling     BILATERAL FEET AND LEGS - ONSET OF SWELLING APRIL 2014  . GERD (gastroesophageal reflux disease)   . Arthritis   . Fracture   . CVA (cerebral vascular accident)     STROKE 7 YRS AGO - DAY AFTER BRAIN SURGERY TO REMOVE BENIGN MENINGIOMA - HAS LEFT SIDED WEAKNESS AND A LITTLE NUMBNESS  . Pain     "EXCRUCIATING PAIN" LEFT HIP - HAS A FRACTURE - IS CONFINED TO W/C AT HOME - NO WEIGHT BEARING LEFT HIP.    Past Surgical History  Procedure Laterality Date  . Brain surgery      tumor removed  . Tonsillectomy    . Partial hysterectomy    . Glaucoma repair    . Cataract extraction    .  Kidney surgery      left - HX ENLARGED LEFT KIDNEY ON XRAY - SURGERY WAS DONE ON URETER   . Colonoscopy    . Coronary angioplasty    . Hip arthroplasty Left 12/29/2012    Procedure: LEFT HIP HEMI ARTHROPLASTY ;  Surgeon: Mauri Pole, MD;  Location: [facility];  Service: Orthopedics;  Laterality: Left;    There were no vitals taken for this visit.  Visit Diagnosis:  Muscle weakness (generalized)  Stiffness of left knee  Hamstring tightness of left lower extremity  Pain in joint, pelvic region and thigh, left      Subjective Assessment - 05/12/14 1348    Symptoms Pt stated she is stiff today due to weather, current pain scale 5/10 Lt hip.   Currently in Pain? Yes   Pain Score 5    Pain Location Hip   Pain Orientation Left           [facility] Adult PT Treatment/Exercise - 05/12/14 1420    Exercises   Exercises Knee/Hip;Balance   Lumbar Exercises: Standing   Forward Lunge 10 reps   Forward Lunge Limitations 6in box with  UE flexion and thoracic extension with cervical extension   Knee/Hip Exercises: Aerobic   Stationary Bike Nu step 8' for sequencign of gait and activity tolerance SPM 42   Knee/Hip Exercises: Standing   Forward Lunges 10 reps   Forward Lunges Limitations with UE flexion and thoracic excursion during lunges   Side Lunges 10 reps   Functional Squat Limitations 3D hip excursion 2 sets x 10reps  with min to mod assist   Gait Training 250 ft with dynamic gait activities no AD including marching, hamstring curls, toes in, toes out, sidestepping, wide and large steps; opposite arm and LE to improve rotation with gait    Other Standing Knee Exercises 3D hip excursion 10x,   Other Standing Knee Exercises Tandem, retro and sidestepping with red tband 1RT each            PT Short Term Goals - 05/12/14 1417    PT SHORT TERM GOAL #1   Title Patient will display increased hamstring mobility for a 90/90 hamstring test to at least 80 degrees so she can more easily bend  over to touch toes   Status On-going   PT SHORT TERM GOAL #2   Title Patient will display bilateral ankle dorsiflexion of at least 5 degrees to be able to sit to stand from chair with improved mechanics/ease   Status On-going   PT SHORT TERM GOAL #3   Title Patient will display Elys test of >120 degrees knee flexion to more easily perform sit to stand   Status On-going   PT SHORT TERM GOAL #4   Title Patient will demosntrate increased hip abduction/adduction bilateral to WNL   Status On-going   PT SHORT TERM GOAL #5   Title Patient will displays glut max/hamstring/hip abduction strength of >3/5 MMT bilateral so patient can perform sit to stand without UE support   Status On-going           PT Long Term Goals - 05/12/14 1418    PT LONG TERM GOAL #1   Title Patient will displays glut max strength of >3+/5 MMT bilateral so patient can perform 5x sit to stand without UE support in < 15 seconds   Status On-going   PT LONG TERM GOAL #2   Title Patient will display hamstring strength >4/5 MMT so patient can ambulate at > 1.2 m/s to return to community ambulatign status   PT LONG TERM GOAL #3   Title Patient will display increased hip abduction strength of > 3+/5 so patient can perform single leg stand >3 seconds indicating improved balance.     Status On-going   PT LONG TERM GOAL #4   Title improve BERG balance scale to 45/56   Status On-going               Plan - 05/12/14 1412    Clinical Impression Statement Session focus on dynamic gait mechanics to improve mechanics and balance.  Added rotation activtieis with gait training to improve mobility with trunk with gait with multimodal cueing to improve sequence.  Min assistnace required for balance activiteis, pt improved spatial awareness with ability to return to COG indepenendently to reduce risk of fallls.  Began UE flexion during lunges to improve posture and thoracic extesnion as well as improve balance. Pt did not require  any seated rest breaks but did complete 2 standing rest breaks during session.  Ended session with Nustep for activity tolerance with ability to keep 42 SPM.   PT Next  Visit Plan Continue balance program, working on safety with gait with weight shifting, change in direction, etc.    PT Plan Continue with current POC to improve mobiity, strength and balance.         Problem List Patient Active Problem List   Diagnosis Date Noted  . Muscle weakness (generalized) 02/22/2014  . Stiffness of left knee 02/22/2014  . Hamstring tightness of left lower extremity 02/22/2014  . Pain in joint, pelvic region and thigh 02/22/2014  . Difficulty walking 04/07/2013  . Osteoporosis, unspecified 03/10/2013  . Anemia 03/10/2013  . Mild malnutrition 03/10/2013  . S/P left hip hemiarthroplasty 12/30/2012  . Osteoporosis with pathological fracture with delayed healing 11/25/2012  . Pedal edema 10/27/2012  . Fracture of sacrum 10/02/2012  . Stricture and stenosis of esophagus 09/24/2011  . HYPERLIPIDEMIA-MIXED 11/03/2008  . CAD, UNSPECIFIED SITE 11/03/2008   Ihor Austin, [place]  Aldona Lento 05/12/2014, 2:21 PM  [facility] [address] [place], [place], [address] Phone: [phone]   Fax:  [phone]

## 2014-05-18 ENCOUNTER — Ambulatory Visit (HOSPITAL_COMMUNITY)
Admission: RE | Admit: 2014-05-18 | Discharge: 2014-05-18 | Disposition: A | Discharge status: Home / Self Care | Payer: Medicare Other | Source: Ambulatory Visit | Attending: Orthopedic Surgery | Admitting: Orthopedic Surgery

## 2014-05-18 DIAGNOSIS — M25662 Stiffness of left knee, not elsewhere classified: Secondary | ICD-10-CM

## 2014-05-18 DIAGNOSIS — M25552 Pain in left hip: Secondary | ICD-10-CM

## 2014-05-18 DIAGNOSIS — Z471 Aftercare following joint replacement surgery: Secondary | ICD-10-CM | POA: Diagnosis not present

## 2014-05-18 DIAGNOSIS — M6281 Muscle weakness (generalized): Secondary | ICD-10-CM

## 2014-05-18 DIAGNOSIS — M6289 Other specified disorders of muscle: Secondary | ICD-10-CM

## 2014-05-18 NOTE — Therapy (Signed)
[place], [place], [address] Phone: [phone]   Fax:  [phone]  Physical Therapy Treatment  Patient Details  Name: KESHAWNA DIX MRN: [id number] Date of Birth: 06/30/1934  Encounter Date: 05/18/2014      PT End of Session - 05/18/14 1616    Visit Number 29   Number of Visits 30   Date for PT Re-Evaluation 05/21/14   Authorization Type medicare   Authorization Time Period G-code updated visit 24   Authorization - Visit Number 29   Authorization - Number of Visits 30   PT Start Time 1300   PT Stop Time 1348   PT Time Calculation (min) 48 min   PT Charge Details Gait 1300-1323, NMR [id number], TE [id number]   Equipment Utilized During Treatment Gait belt   Activity Tolerance Patient tolerated treatment well   Behavior During Therapy [facility] for tasks assessed/performed      Past Medical History  Diagnosis Date  . Hyperlipemia   . IBS (irritable bowel syndrome)   . Osteoporosis   . Fibromyalgia   . Glaucoma     HAD LASER SURGERY - NO EYE DROPS REQUIRED  . Globus hystericus   . Fibromyalgia   . Meningioma   . CAD (coronary artery disease)     HEART STENT PLACED ABOUT 2000- NO LONGER SEES CARDIOLOGIST; NO C/O OF CHEST PAINS OR SOB  . Heart murmur     MVP SINCE THE 60'S - TAKES ATENOLOL -  . Anxiety   . Swelling     BILATERAL FEET AND LEGS - ONSET OF SWELLING APRIL 2014  . GERD (gastroesophageal reflux disease)   . Arthritis   . Fracture   . CVA (cerebral vascular accident)     STROKE 7 YRS AGO - DAY AFTER BRAIN SURGERY TO REMOVE BENIGN MENINGIOMA - HAS LEFT SIDED WEAKNESS AND A LITTLE NUMBNESS  . Pain     "EXCRUCIATING PAIN" LEFT HIP - HAS A FRACTURE - IS CONFINED TO W/C AT HOME - NO WEIGHT BEARING LEFT HIP.    Past Surgical History  Procedure Laterality Date  . Brain surgery      tumor removed  . Tonsillectomy    . Partial hysterectomy    . Glaucoma repair    . Cataract extraction    .  Kidney surgery      left - HX ENLARGED LEFT KIDNEY ON XRAY - SURGERY WAS DONE ON URETER   . Colonoscopy    . Coronary angioplasty    . Hip arthroplasty Left 12/29/2012    Procedure: LEFT HIP HEMI ARTHROPLASTY ;  Surgeon: Mauri Pole, MD;  Location: [facility];  Service: Orthopedics;  Laterality: Left;    There were no vitals taken for this visit.  Visit Diagnosis:  Muscle weakness (generalized)  Stiffness of left knee  Hamstring tightness of left lower extremity  Pain in joint, pelvic region and thigh, left      Subjective Assessment - 05/18/14 1321    Symptoms Pt stated pain scale 5/10 Lt hip when stepping down while marching   Currently in Pain? Yes   Pain Score 5    Pain Location Hip   Pain Orientation Left          [facility] PT Assessment - 05/18/14 0001    Assessment   Medical Diagnosis Muscle Weakness/Gait from lt hip hemi-arthroplasty 12/2012   Next MD Visit [facility]  [facility] Adult PT Treatment/Exercise - 05/18/14 1322    Exercises   Exercises Knee/Hip;Balance   Knee/Hip Exercises: Stretches   Active Hamstring Stretch 3 reps   Active Hamstring Stretch Limitations 14" step   Gastroc Stretch 3 reps;30 seconds;Limitations   Gastroc Stretch Limitations slant board   Knee/Hip Exercises: Aerobic   Stationary Bike Nu step 10' with UE and LE for sequencign of gait and activity tolerance SPM 55   Knee/Hip Exercises: Standing   Functional Squat 2 sets;10 reps   Functional Squat Limitations 3D hip excursion 2 sets x 10reps  with min to mod assist   Gait Training 500 ft with dynamic gait activities no AD including marching, hamstring curls, toes in, toes out, sidestepping, wide and large steps; opposite arm and LE to improve rotation with gait    Other Standing Knee Exercises 3D hip excursion 10x,   Other Standing Knee Exercises shoulder and butt wall bumps 5x each with min-mod assistance                  PT Short Term Goals - 05/18/14  1622    PT SHORT TERM GOAL #1   Title Patient will display increased hamstring mobility for a 90/90 hamstring test to at least 80 degrees so she can more easily bend over to touch toes   Status On-going   PT SHORT TERM GOAL #2   Title Patient will display bilateral ankle dorsiflexion of at least 5 degrees to be able to sit to stand from chair with improved mechanics/ease   Status On-going   PT SHORT TERM GOAL #3   Title Patient will display Elys test of >120 degrees knee flexion to more easily perform sit to stand   Status On-going   PT SHORT TERM GOAL #4   Title Patient will demosntrate increased hip abduction/adduction bilateral to WNL   Status On-going   PT SHORT TERM GOAL #5   Title Patient will displays glut max/hamstring/hip abduction strength of >3/5 MMT bilateral so patient can perform sit to stand without UE support   Status On-going           PT Long Term Goals - 05/18/14 1622    PT LONG TERM GOAL #1   Title Patient will displays glut max strength of >3+/5 MMT bilateral so patient can perform 5x sit to stand without UE support in < 15 seconds   Status On-going   PT LONG TERM GOAL #2   Title Patient will display hamstring strength >4/5 MMT so patient can ambulate at > 1.2 m/s to return to community ambulatign status   PT LONG TERM GOAL #3   Title Patient will display increased hip abduction strength of > 3+/5 so patient can perform single leg stand >3 seconds indicating improved balance.     Status On-going   PT LONG TERM GOAL #4   Title improve BERG balance scale to 45/56   Status On-going               Plan - 05/18/14 1617    Clinical Impression Statement Session focus on improving hip mobilityi with dynamic gait mechanics to improve mechanics and balance.  Continued rotation activities to improve coordination and sequencing with gait training with multimodal cueing to improve hip mobility and began marchines with shoulder flexion to improve thoracic extension  to improve posture.  Resumed wall bumps for core strengthening to improve balance, pt continues to present posterior lean with standing from sitting position requiring min assistnace to  reduce risk of falls.  Increased activity tolerance today with increased time and speed noted on Nustep at end of session (55 SPM).   PT Next Visit Plan Reassess next session.          Problem List Patient Active Problem List   Diagnosis Date Noted  . Muscle weakness (generalized) 02/22/2014  . Stiffness of left knee 02/22/2014  . Hamstring tightness of left lower extremity 02/22/2014  . Pain in joint, pelvic region and thigh 02/22/2014  . Difficulty walking 04/07/2013  . Osteoporosis, unspecified 03/10/2013  . Anemia 03/10/2013  . Mild malnutrition 03/10/2013  . S/P left hip hemiarthroplasty 12/30/2012  . Osteoporosis with pathological fracture with delayed healing 11/25/2012  . Pedal edema 10/27/2012  . Fracture of sacrum 10/02/2012  . Stricture and stenosis of esophagus 09/24/2011  . HYPERLIPIDEMIA-MIXED 11/03/2008  . CAD, UNSPECIFIED SITE 11/03/2008   Ihor Austin, [place]  Aldona Lento 05/18/2014, 4:24 PM  [place], [place], [address] Phone: [phone]   Fax:  [phone]

## 2014-05-20 ENCOUNTER — Ambulatory Visit (HOSPITAL_COMMUNITY)
Admission: RE | Admit: 2014-05-20 | Discharge: 2014-05-20 | Disposition: A | Discharge status: Home / Self Care | Payer: Medicare Other | Source: Ambulatory Visit | Attending: Orthopedic Surgery | Admitting: Orthopedic Surgery

## 2014-05-20 DIAGNOSIS — M6281 Muscle weakness (generalized): Secondary | ICD-10-CM

## 2014-05-20 DIAGNOSIS — M25662 Stiffness of left knee, not elsewhere classified: Secondary | ICD-10-CM

## 2014-05-20 DIAGNOSIS — M6289 Other specified disorders of muscle: Secondary | ICD-10-CM

## 2014-05-20 DIAGNOSIS — M25552 Pain in left hip: Secondary | ICD-10-CM

## 2014-05-20 DIAGNOSIS — Z471 Aftercare following joint replacement surgery: Secondary | ICD-10-CM | POA: Diagnosis not present

## 2014-05-20 NOTE — Therapy (Signed)
[place], [place], [address] Phone: [phone]   Fax:  [phone]  Physical Therapy Treatment/Discharge  Patient Details  Name: Samantha Hudson MRN: [id number] Date of Birth: 1935/01/07  Encounter Date: 05/20/2014      PT End of Session - 05/20/14 1310    Visit Number 30   Number of Visits 30   Date for PT Re-Evaluation 05/21/14   Authorization Type medicare   Authorization - Visit Number 30   Authorization - Number of Visits 30   PT Start Time 8850   PT Stop Time 1345   PT Time Calculation (min) 41 min   Equipment Utilized During Treatment Gait belt   Activity Tolerance Patient tolerated treatment well   Behavior During Therapy [facility] for tasks assessed/performed      Past Medical History  Diagnosis Date  . Hyperlipemia   . IBS (irritable bowel syndrome)   . Osteoporosis   . Fibromyalgia   . Glaucoma     HAD LASER SURGERY - NO EYE DROPS REQUIRED  . Globus hystericus   . Fibromyalgia   . Meningioma   . CAD (coronary artery disease)     HEART STENT PLACED ABOUT 2000- NO LONGER SEES CARDIOLOGIST; NO C/O OF CHEST PAINS OR SOB  . Heart murmur     MVP SINCE THE 60'S - TAKES ATENOLOL -  . Anxiety   . Swelling     BILATERAL FEET AND LEGS - ONSET OF SWELLING APRIL 2014  . GERD (gastroesophageal reflux disease)   . Arthritis   . Fracture   . CVA (cerebral vascular accident)     STROKE 7 YRS AGO - DAY AFTER BRAIN SURGERY TO REMOVE BENIGN MENINGIOMA - HAS LEFT SIDED WEAKNESS AND A LITTLE NUMBNESS  . Pain     "EXCRUCIATING PAIN" LEFT HIP - HAS A FRACTURE - IS CONFINED TO W/C AT HOME - NO WEIGHT BEARING LEFT HIP.    Past Surgical History  Procedure Laterality Date  . Brain surgery      tumor removed  . Tonsillectomy    . Partial hysterectomy    . Glaucoma repair    . Cataract extraction    . Kidney surgery      left - HX ENLARGED LEFT KIDNEY ON XRAY - SURGERY WAS DONE ON URETER   . Colonoscopy     . Coronary angioplasty    . Hip arthroplasty Left 12/29/2012    Procedure: LEFT HIP HEMI ARTHROPLASTY ;  Surgeon: Mauri Pole, MD;  Location[facility];  Service: Orthopedics;  Laterality: Left;    There were no vitals taken for this visit.  Visit Diagnosis:  Muscle weakness (generalized)  Stiffness of left knee  Hamstring tightness of left lower extremity  Pain in joint, pelvic region and thigh, left      Subjective Assessment - 05/20/14 1308    Symptoms patient states pain in Lt Hip ranked at a 6, last night ranked at a 9/10.    Currently in Pain? Yes   Pain Score 6    Pain Location Hip   Pain Orientation Left          [facility] PT Assessment - 05/20/14 0001    Assessment   Medical Diagnosis Muscle Weakness/Gait from lt hip hemi-arthroplasty 12/2012   Next MD Visit [facility]   AROM   Left Hip Flexion 100   Left Hip External Rotation  38   Left Hip Internal Rotation  45  Left Hip ABduction 15   Left Hip ADduction 20   Left Knee Extension -2   Left Knee Flexion 132   Left Ankle Dorsiflexion -10   Strength   Right Hip Flexion 4/5   Right Hip Extension 3+/5   Right Hip ABduction 3+/5   Left Hip Flexion 4/5   Left Hip Extension 2+/5   Left Hip ABduction 2+/5   Right Knee Flexion --  4+/5 (was 4/5)   Right Knee Extension 4/5  was 4/5   Left Knee Flexion 4/5   Left Knee Extension 4/5   Right Ankle Dorsiflexion 4/5   Left Ankle Dorsiflexion 4/5   Flexibility   Soft Tissue Assessment /Muscle Lenght yes   Hamstrings 90/90 Test : Rt -32, Lt -31   Quadriceps Rt 11 1/4in,  Lt  10 1/2 in to glute   6 Minute Walk- Baseline   6 Minute Walk- Baseline yes  771f was 735.869ft with RW                          PT Education - 05/20/14 1505    Education provided Yes   Education Details Patient and husband educated in [place] continued strengthening including 4way straight leg raises, sit to stand, heel raises, and standing hip abduction.    Person(s)  Educated Patient;Spouse   Methods Explanation;Demonstration;Handout   Comprehension Verbalized understanding;Returned demonstration          PT Short Term Goals - 05/20/14 1513    PT SHORT TERM GOAL #1   Title Patient will display increased hamstring mobility for a 90/90 hamstring test to at least 80 degrees so she can more easily bend over to touch toes   Status Not Met   PT SHORT TERM GOAL #2   Title Patient will display bilateral ankle dorsiflexion of at least 5 degrees to be able to sit to stand from chair with improved mechanics/ease   Status Not Met   PT SHORT TERM GOAL #3   Title Patient will display Elys test of >120 degrees knee flexion to more easily perform sit to stand   Status Not Met   PT SHORT TERM GOAL #4   Title Patient will demosntrate increased hip abduction/adduction bilateral to WNL   Status Not Met   PT SHORT TERM GOAL #5   Title Patient will displays glut max/hamstring/hip abduction strength of >3/5 MMT bilateral so patient can perform sit to stand without UE support   Status Not Met           PT Long Term Goals - 05/20/14 1513    PT LONG TERM GOAL #1   Title Patient will displays glut max strength of >3+/5 MMT bilateral so patient can perform 5x sit to stand without UE support in < 15 seconds   Status Not Met   PT LONG TERM GOAL #2   Title Patient will display hamstring strength >4/5 MMT so patient can ambulate at > 1.2 m/s to return to community ambulatign status   Status Partially Met   PT LONG TERM GOAL #3   Title Patient will display increased hip abduction strength of > 3+/5 so patient can perform single leg stand >3 seconds indicating improved balance.     PT LONG TERM GOAL #4   Title improve BERG balance scale to 45/56   Status --  unable to test due to dependence on UE assistance.  Plan - 05/20/14 1506    Clinical Impression Statement Patient dispalsy minimal progress towards goals since last assessment with minimal  improvement in walking distance/speed in 65mnutes, minimal improvement in LE strength, and continued stiffness in Lt LE. Despite continued focus on LE strengthening and  balance patient is still completely dependent on the 4WW or bilateral UE assistance to maintain standing balance.  Patient is being discharged from therapy due to a lack of progress since the last reassessment. Patient was given HEP to  perform for continued strengthening and maintenance.    PT Plan Patient discharged with HEP.         Problem List Patient Active Problem List   Diagnosis Date Noted  . Muscle weakness (generalized) 02/22/2014  . Stiffness of left knee 02/22/2014  . Hamstring tightness of left lower extremity 02/22/2014  . Pain in joint, pelvic region and thigh 02/22/2014  . Difficulty walking 04/07/2013  . Osteoporosis, unspecified 03/10/2013  . Anemia 03/10/2013  . Mild malnutrition 03/10/2013  . S/P left hip hemiarthroplasty 12/30/2012  . Osteoporosis with pathological fracture with delayed healing 11/25/2012  . Pedal edema 10/27/2012  . Fracture of sacrum 10/02/2012  . Stricture and stenosis of esophagus 09/24/2011  . HYPERLIPIDEMIA-MIXED 11/03/2008  . CAD, UNSPECIFIED SITE 11/03/2008   CDevona KonigPT DPT [address] [address]Phone: [phone]  Fax:  [phone] PHYSICAL THERAPY DISCHARGE SUMMARY  Visits from Start of Care: 30  Plan: Patient agrees to discharge.  Patient goals were not met. Patient is being discharged due to lack of progress.  ?????       CDevona KonigPT DPT [phone]

## 2014-05-20 NOTE — Patient Instructions (Signed)
Extension Lift   Keeping knee locked, tighten buttock muscles to raise sound limb >6 inches. Hold 3 seconds. Repeat 10 to 20 times. Do 2 sessions per day.  Copyright  VHI. All rights reserved.   Abduction   Lift leg up toward ceiling. Return.  Repeat 10 to 20 times each leg. Do 2 sessions per day.  http://gt2.exer.us/386   Copyright  VHI. All rights reserved.  Hip Flexion / Knee Extension: Straight-Leg Raise (Eccentric)   Lie on back. Lift leg with knee straight. Slowly lower leg for 3-5 seconds. 10-20 reps per set, 2 sets per day,  Copyright  VHI. All rights reserved.   HIP / KNEE: Extension - Sit to Stand   Sitting, lean chest forward, raise hips up from surface. Straighten hips and knees. Weight bear equally on left and right sides. Backs of legs should not push off surface. 10 reps per set, 2 sets per day, Use assistive device as needed.  Copyright  VHI. All rights reserved.   Heel Raises   Stand with support. Tighten pelvic floor and hold. With knees straight, raise heels off ground. Hold 1 second.  Repeat 10 times. Do 2 times a day.  Copyright  VHI. All rights reserved.  Bridge   Lie back, legs bent. Inhale, pressing hips up. Keeping ribs in, lengthen lower back. Exhale, rolling down along spine from top. Repeat 10 to 20  times. Do 2  sessions per day.  Copyright  VHI. All rights reserved.   ABDUCTION: Standing (Active)   Stand, feet flat. Lift right leg out to side.  Complete  10 to 20 repetitions. Perform 2 sessions per day.  http://gtsc.exer.us/111   Copyright  VHI. All rights reserved.

## 2014-05-27 ENCOUNTER — Other Ambulatory Visit: Payer: Self-pay | Admitting: Family Medicine

## 2014-05-31 ENCOUNTER — Telehealth: Payer: Self-pay | Admitting: Family Medicine

## 2014-05-31 MED ORDER — ATENOLOL 25 MG PO TABS: ORAL_TABLET | ORAL | Status: DC

## 2014-05-31 NOTE — Telephone Encounter (Signed)
pts spouse would like a call back as soon as possible, he don't want to  Go over this when she is around.  He feels concern for her at this point in time an also feels the cut back on  The atenolol (TENORMIN) 25 MG tablet  Was a mistake an he wants  To also talk to you about this please

## 2014-05-31 NOTE — Telephone Encounter (Signed)
Left message to return call 

## 2014-05-31 NOTE — Telephone Encounter (Signed)
Rx sent electronically to pharmacy. Husband notified.

## 2014-05-31 NOTE — Telephone Encounter (Signed)
Husband wants the atenolol increased back to a whole tablet a day. It was decreased due to orthostatic hypotension but feels like the patient has Lewey Body Dementia( Husbands diagnosis) and that is the cause of that- want meds sent to pharmacy with new directions

## 2014-05-31 NOTE — Telephone Encounter (Signed)
May increase back to 1 bid, let us know if any problems

## 2014-06-08 ENCOUNTER — Other Ambulatory Visit: Payer: Self-pay | Admitting: *Deleted

## 2014-06-08 NOTE — Telephone Encounter (Signed)
This is approved +5 additional refills

## 2014-06-08 NOTE — Telephone Encounter (Signed)
LAST SEEN 03/30/14.

## 2014-06-09 MED ORDER — TRAMADOL HCL 50 MG PO TABS: 50.0000 mg | ORAL_TABLET | Freq: Three times a day (TID) | ORAL | Status: DC | PRN

## 2014-06-23 ENCOUNTER — Other Ambulatory Visit: Payer: Self-pay | Admitting: Family Medicine

## 2014-06-23 DIAGNOSIS — Z1231 Encounter for screening mammogram for malignant neoplasm of breast: Secondary | ICD-10-CM

## 2014-06-25 ENCOUNTER — Other Ambulatory Visit: Payer: Self-pay | Admitting: Family Medicine

## 2014-07-26 ENCOUNTER — Other Ambulatory Visit: Payer: Self-pay | Admitting: Family Medicine

## 2014-07-26 ENCOUNTER — Ambulatory Visit (HOSPITAL_COMMUNITY)
Admission: RE | Admit: 2014-07-26 | Discharge: 2014-07-26 | Disposition: A | Discharge status: Home / Self Care | Payer: Medicare Other | Source: Ambulatory Visit | Attending: Family Medicine | Admitting: Family Medicine

## 2014-07-26 DIAGNOSIS — Z1231 Encounter for screening mammogram for malignant neoplasm of breast: Secondary | ICD-10-CM | POA: Insufficient documentation

## 2014-07-28 ENCOUNTER — Encounter: Payer: Self-pay | Admitting: Family Medicine

## 2014-07-28 ENCOUNTER — Ambulatory Visit (INDEPENDENT_AMBULATORY_CARE_PROVIDER_SITE_OTHER): Payer: Medicare Other | Admitting: Family Medicine

## 2014-07-28 VITALS — BP 110/62 | Ht 66.0 in | Wt 102.0 lb

## 2014-07-28 DIAGNOSIS — M810 Age-related osteoporosis without current pathological fracture: Secondary | ICD-10-CM

## 2014-07-28 DIAGNOSIS — F09 Unspecified mental disorder due to known physiological condition: Secondary | ICD-10-CM

## 2014-07-28 DIAGNOSIS — D509 Iron deficiency anemia, unspecified: Secondary | ICD-10-CM | POA: Diagnosis not present

## 2014-07-28 DIAGNOSIS — T502X5S Adverse effect of carbonic-anhydrase inhibitors, benzothiadiazides and other diuretics, sequela: Secondary | ICD-10-CM

## 2014-07-28 DIAGNOSIS — E785 Hyperlipidemia, unspecified: Secondary | ICD-10-CM | POA: Diagnosis not present

## 2014-07-28 DIAGNOSIS — Z79899 Other long term (current) drug therapy: Secondary | ICD-10-CM | POA: Diagnosis not present

## 2014-07-28 NOTE — Progress Notes (Signed)
   Subjective:    Patient ID: Samantha Hudson, female    DOB: 02/25/1935, 80 y.o.   MRN: [id number]  HPI Patient is here today for a check up. She is taking the medication that she is prescribed on a regular basis she denies being depressed her appetite is fairly good.  Husband relates some intermittent cognitive dysfunction he states at times she has difficult time remembering what was told to her at times she repeats herself other times he is noticed tremor in her right hand and difficult time getting up and walking.  She would like to talk about Reclast and the side effects. She read where one-person taking this similar medicine had atypical fracture of the femur. I recognize with the patient that this is a possibility a side effect with the medicine but with her severe osteoporosis she actually faces a much higher risk of a hip fracture back fracture rather then this type of atypical fracture in the benefits of the medicine outweigh the risk after discussion she and her husband agree with this.  Fingers are turning blue/purple -this is intermittently. Also her hands and fingers tending get cool. She does try to keep him warm but she does not wear gloves.  Leg spasms-she intermittent leg cramps and discomfort. Has difficult time with this. Does limited amount of walking. Because of weakness.   Review of Systems     Objective:   Physical Exam Lungs are clear hearts regular pulse normal she does have some cool fingers. No sign of cyanosis today good pulses in the radial pulse. Extremities does have some venous stasis changes in the feet. She is wearing knee-high stockings today. Her weight is stable. Patients recall is immediate 343 after distraction tube for 3. Clock drawing passes. Can name 14 animals within 1 minute. The husband does relate that the patient does intermittently repeat herself and go over things she is already asked.      Assessment & Plan:  Cognitive dysfunction-she is  had a stroke in the past. The husband is worried about blue we body dementia but the patient has not had any abnormal behavioral aspects to her. This could be onset of dementia/Alzheimer's. I believe the patient would benefit from going had been seen by neurology. Patient does have intermittent tremors of the right hand and at times has difficult time with proper gait. In addition to this the patient relates weakness on the right side of her body that that's been present over the past month. So we will go ahead and order MRI and also refer patient to neurology.  Osteoporosis I commenced the patient she truly does need to take her medicine so we will make sure that the patient does get Reclast  Because of diuretic use check metabolic panel encourage regular eating Because of hyperlipidemia check lipid liver profile Patient has history of anemia will check CBC History of vitamin D deficiency will check vitamin D  25 minutes spent with patient.

## 2014-08-03 LAB — CBC WITH DIFFERENTIAL/PLATELET
Basophils Absolute: 0 10*3/uL (ref 0.0–0.2)
Basos: 0 %
EOS ABS: 0.2 10*3/uL (ref 0.0–0.4)
EOS: 4 %
HCT: 38.4 % (ref 34.0–46.6)
HEMOGLOBIN: 12.5 g/dL (ref 11.1–15.9)
IMMATURE GRANS (ABS): 0 10*3/uL (ref 0.0–0.1)
IMMATURE GRANULOCYTES: 0 %
LYMPHS: 26 %
Lymphocytes Absolute: 1.3 10*3/uL (ref 0.7–3.1)
MCH: 29.5 pg (ref 26.6–33.0)
MCHC: 32.6 g/dL (ref 31.5–35.7)
MCV: 91 fL (ref 79–97)
Monocytes Absolute: 0.5 10*3/uL (ref 0.1–0.9)
Monocytes: 9 %
Neutrophils Absolute: 3.1 10*3/uL (ref 1.4–7.0)
Neutrophils Relative %: 61 %
Platelets: 205 10*3/uL (ref 150–379)
RBC: 4.24 x10E6/uL (ref 3.77–5.28)
RDW: 13.8 % (ref 12.3–15.4)
WBC: 5.1 10*3/uL (ref 3.4–10.8)

## 2014-08-03 LAB — BASIC METABOLIC PANEL
BUN/Creatinine Ratio: 31 — ABNORMAL HIGH (ref 11–26)
BUN: 31 mg/dL — ABNORMAL HIGH (ref 8–27)
CALCIUM: 10.2 mg/dL (ref 8.7–10.3)
CHLORIDE: 99 mmol/L (ref 97–108)
CO2: 26 mmol/L (ref 18–29)
CREATININE: 1.01 mg/dL — AB (ref 0.57–1.00)
GFR calc Af Amer: 62 mL/min/{1.73_m2} (ref >59)
GFR calc non Af Amer: 54 mL/min/{1.73_m2} — ABNORMAL LOW (ref >59)
GLUCOSE: 87 mg/dL (ref 65–99)
Potassium: 4.7 mmol/L (ref 3.5–5.2)
Sodium: 143 mmol/L (ref 134–144)

## 2014-08-03 LAB — VITAMIN D 25 HYDROXY (VIT D DEFICIENCY, FRACTURES): Vit D, 25-Hydroxy: 43.7 ng/mL (ref 30.0–100.0)

## 2014-08-03 LAB — LIPID PANEL
CHOL/HDL RATIO: 2.1 ratio (ref 0.0–4.4)
Cholesterol, Total: 178 mg/dL (ref 100–199)
HDL: 84 mg/dL (ref >39)
LDL Calculated: 83 mg/dL (ref 0–99)
TRIGLYCERIDES: 53 mg/dL (ref 0–149)
VLDL CHOLESTEROL CAL: 11 mg/dL (ref 5–40)

## 2014-08-03 LAB — HEPATIC FUNCTION PANEL
ALBUMIN: 4.3 g/dL (ref 3.5–4.8)
ALT: 21 IU/L (ref 0–32)
AST: 27 IU/L (ref 0–40)
Alkaline Phosphatase: 48 IU/L (ref 39–117)
Bilirubin Total: 0.4 mg/dL (ref 0.0–1.2)
Bilirubin, Direct: 0.13 mg/dL (ref 0.00–0.40)
Total Protein: 6.9 g/dL (ref 6.0–8.5)

## 2014-08-04 ENCOUNTER — Ambulatory Visit (HOSPITAL_COMMUNITY)
Admission: RE | Admit: 2014-08-04 | Discharge: 2014-08-04 | Disposition: A | Discharge status: Home / Self Care | Payer: Medicare Other | Source: Ambulatory Visit | Attending: Family Medicine | Admitting: Family Medicine

## 2014-08-04 DIAGNOSIS — F09 Unspecified mental disorder due to known physiological condition: Secondary | ICD-10-CM | POA: Diagnosis present

## 2014-08-09 ENCOUNTER — Telehealth: Payer: Self-pay | Admitting: Family Medicine

## 2014-08-09 NOTE — Telephone Encounter (Signed)
Pts spouse states they are back home if you would call him to  [place] the message from last weekend.

## 2014-08-11 NOTE — Telephone Encounter (Signed)
MRI no new stroke,discussed with husband,initial signs of cognitive dysfunction ( passed minicog) keep standard follow up

## 2014-08-12 ENCOUNTER — Ambulatory Visit (INDEPENDENT_AMBULATORY_CARE_PROVIDER_SITE_OTHER): Payer: Medicare Other | Admitting: Neurology

## 2014-08-12 ENCOUNTER — Encounter: Payer: Self-pay | Admitting: Neurology

## 2014-08-12 VITALS — BP 110/50 | HR 64 | Resp 16 | Ht 65.5 in | Wt 102.0 lb

## 2014-08-12 DIAGNOSIS — R269 Unspecified abnormalities of gait and mobility: Secondary | ICD-10-CM | POA: Diagnosis not present

## 2014-08-12 DIAGNOSIS — G20 Parkinson's disease: Secondary | ICD-10-CM | POA: Diagnosis not present

## 2014-08-12 DIAGNOSIS — I639 Cerebral infarction, unspecified: Secondary | ICD-10-CM | POA: Insufficient documentation

## 2014-08-12 MED ORDER — CARBIDOPA-LEVODOPA 25-100 MG PO TABS: 1.0000 | ORAL_TABLET | Freq: Three times a day (TID) | ORAL | Status: DC

## 2014-08-12 NOTE — Progress Notes (Signed)
PATIENT: Samantha Hudson DOB: Mar 10, 1935  HISTORICAL  SILVIE OBREMSKI is a 80 years old right-handed Caucasian female, accompanied by her husband, referred by her primary care physician Dr. Nicki Reaper looking for evaluation of worsening gait difficulty, difficulty initiate gait  She had a history of right meningioma, status post resection in 2007, 2 days later, she suffered a large right MCA stroke, also with past medical history of coronary artery disease, status post stent placement, she recovered very well, was able to drive, ambulate without assistance, but with residual left hemiparesthesia, often unpleasant deep achy pain involving left side of her body,  She had osteoporosis, left hip fracture in 2014, require replacement in August 2014, since surgery, she had a great decline of her ambulatory ability, she now rely on her walker  In 2014, she also developed worsening loss sense of smell, REM sleep disorder, screaming out of her dreams, mild constipation, now she noticed right hand tremor, small handwriting since 2015, mild memory trouble, worsening gait difficulty, difficulty initiate walking using her right leg, tendency to lean backwards, worsening left-sided pain, especially around her left hip, left anterior thigh.  She is taking gabapentin, which has been helpful, but 400 mg 4 times a day, will make her sleepy, she is only taking 900 mg daily now, continue have excessive fatigue, daytime sleepiness, difficult to read, double vision,  Her husband is a retired Software engineer  We have reviewed MRI of the brain August 04 2014, demonstrate large size right MCA stroke, involving right medial, and lateral temporal lobe, no acute lesions.  REVIEW OF SYSTEMS: Full 14 system review of systems performed and notable only for double vision, easy bruising, incontinence, achy muscles, trouble swallowing, running nose, numbness, difficulty swallowing, restless leg  ALLERGIES: Allergies  Allergen  Reactions  . Lactose Intolerance (Gi)   . Penicillins     RASH - REACTION WAS YEARS AGO    HOME MEDICATIONS: Current Outpatient Prescriptions  Medication Sig Dispense Refill  . atenolol (TENORMIN) 25 MG tablet TAKE (1) TABLET TWICE A DAY. 60 tablet 1  . atorvastatin (LIPITOR) 10 MG tablet TAKE 1 TABLET AT BEDTIME FOR CHOLESTEROL 30 tablet 2  . calcium-vitamin D (OSCAL WITH D) 500-200 MG-UNIT per tablet Take 1 tablet by mouth.    . cholecalciferol (VITAMIN D) 400 UNITS TABS tablet Take 400 Units by mouth daily.    Marland Kitchen dipyridamole-aspirin (AGGRENOX) 200-25 MG per 12 hr capsule TAKE  (1)  CAPSULE  TWICE DAILY. 60 capsule 12  . DULoxetine (CYMBALTA) 30 MG capsule TAKE (1) CAPSULE DAILY. 30 capsule 12  . feeding supplement (ENSURE COMPLETE) LIQD Take 237 mL by mouth daily.    Marland Kitchen gabapentin (NEURONTIN) 100 MG capsule Take one in the afternoon 30 capsule 12  . gabapentin (NEURONTIN) 400 MG capsule Take 1 capsule (400 mg total) by mouth 2 (two) times daily. 60 capsule 12  . polyethylene glycol powder (GLYCOLAX/MIRALAX) powder Take 17 g by mouth 2 (two) times daily. 255 g 0  . potassium chloride (K-DUR) 10 MEQ tablet TAKE (1) TABLET TWICE A DAY. 60 tablet 12  . ranitidine (ZANTAC) 300 MG tablet TAKE (1) TABLET DAILY AS DIRECTED. 30 tablet 12  . torsemide (DEMADEX) 20 MG tablet Take one half qam 15 tablet 11  . traMADol (ULTRAM) 50 MG tablet Take 1 tablet (50 mg total) by mouth 3 (three) times daily as needed. 60 tablet 5     PAST MEDICAL HISTORY: Past Medical History  Diagnosis Date  .  Hyperlipemia   . IBS (irritable bowel syndrome)   . Osteoporosis   . Fibromyalgia   . Glaucoma     HAD LASER SURGERY - NO EYE DROPS REQUIRED  . Globus hystericus   . Fibromyalgia   . Meningioma   . CAD (coronary artery disease)     HEART STENT PLACED ABOUT 2000- NO LONGER SEES CARDIOLOGIST; NO C/O OF CHEST PAINS OR SOB  . Heart murmur     MVP SINCE THE 60'S - TAKES ATENOLOL -  . Anxiety   . Swelling      BILATERAL FEET AND LEGS - ONSET OF SWELLING APRIL 2014  . GERD (gastroesophageal reflux disease)   . Arthritis   . Fracture   . CVA (cerebral vascular accident)     STROKE 7 YRS AGO - DAY AFTER BRAIN SURGERY TO REMOVE BENIGN MENINGIOMA - HAS LEFT SIDED WEAKNESS AND A LITTLE NUMBNESS  . Pain     "EXCRUCIATING PAIN" LEFT HIP - HAS A FRACTURE - IS CONFINED TO W/C AT HOME - NO WEIGHT BEARING LEFT HIP.    PAST SURGICAL HISTORY: Past Surgical History  Procedure Laterality Date  . Brain surgery      tumor removed  . Tonsillectomy    . Partial hysterectomy    . Glaucoma repair    . Cataract extraction    . Kidney surgery      left - HX ENLARGED LEFT KIDNEY ON XRAY - SURGERY WAS DONE ON URETER   . Colonoscopy    . Coronary angioplasty    . Hip arthroplasty Left 12/29/2012    Procedure: LEFT HIP HEMI ARTHROPLASTY ;  Surgeon: Mauri Pole, MD;  Location: [facility];  Service: Orthopedics;  Laterality: Left;    FAMILY HISTORY: Family History  Problem Relation Age of Onset  . Liver disease Father   . Diabetes Father   . Coronary artery disease Mother     deseased  . Heart attack Mother   . Hyperlipidemia Mother   . Breast cancer Sister     x 2  . Cancer Sister     breast    SOCIAL HISTORY:  History   Social History  . Marital Status: Married    Spouse Name: N/A  . Number of Children: 2  . Years of Education: N/A   Occupational History  . retired Pharmacist, hospital    Social History Main Topics  . Smoking status: Never Smoker   . Smokeless tobacco: Never Used  . Alcohol Use: No  . Drug Use: No  . Sexual Activity: Not on file   Other Topics Concern  . Not on file   Social History Narrative     PHYSICAL EXAM   Filed Vitals:   08/12/14 1334  BP: 110/50  Pulse: 64  Resp: 16  Height: 5' 5.5" (1.664 m)  Weight: 102 lb (46.267 kg)    Not recorded      Body mass index is 16.71 kg/(m^2).  PHYSICAL EXAMNIATION:  Gen: NAD, conversant, well nourised, obese, well  groomed                     Cardiovascular: Regular rate rhythm, no peripheral edema, warm, nontender. Eyes: Conjunctivae clear without exudates or hemorrhage Neck: Supple, no carotid bruise. Pulmonary: Clear to auscultation bilaterally   NEUROLOGICAL EXAM:  MENTAL STATUS: Speech:    Speech is normal; fluent and spontaneous with normal comprehension.  Cognition:    T mild mask face, needing mental status examination  is 30 out of 30, animal naming is 17  CRANIAL NERVES: CN II: Visual fields are full to confrontation. Fundoscopic exam is normal with sharp discs and no vascular changes. Venous pulsations are present bilaterally. Pupils are 4 mm and briskly reactive to light. Visual acuity is 20/20 bilaterally. CN III, IV, VI: extraocular movement are normal. No ptosis. CN V: Facial sensation is intact to pinprick in all 3 divisions bilaterally. Corneal responses are intact.  CN VII: Face is symmetric with normal eye closure and smile. CN VIII: Hearing is normal to rubbing fingers CN IX, X: Palate elevates symmetrically. Phonation is normal. CN XI: Head turning and shoulder shrug are intact CN XII: Tongue is midline with normal movements and no atrophy.  MOTOR: Left arm pronation drift, mild spasticity, fixation on rapid rotating movement, mild left upper extremity proximal and distal muscle weakness, bradykinesia, worsening with reinforcement maneuver, mild/moderate nuchal Rigidity,  REFLEXES: Reflexes are 2+ and symmetric at the biceps, triceps, knees, and ankles. Plantar responses are flexor.  SENSORY: Decreased light touch, pinprick and vibratory sensation at left side of her body,    COORDINATION: Rapid alternating movements and fine finger movements are intact. There is no dysmetria on finger-to-nose and heel-knee-shin. There are no abnormal or extraneous movements.   GAIT/STANCE: Need assistant to get up from seated position, tendency to leaning back worse, difficulty initiate  gait, especially with her right leg, Enblock turning, stiff, small stride   DIAGNOSTIC DATA (LABS, IMAGING, TESTING) - I reviewed patient records, labs, notes, testing and imaging myself where available.  Lab Results  Component Value Date   WBC 5.1 08/02/2014   HGB 12.5 08/02/2014   HCT 38.4 08/02/2014   MCV 91 08/02/2014   PLT 205 08/02/2014      Component Value Date/Time   NA 143 08/02/2014 1112   NA 140 04/02/2014 1301   K 4.7 08/02/2014 1112   CL 99 08/02/2014 1112   CO2 26 08/02/2014 1112   GLUCOSE 87 08/02/2014 1112   GLUCOSE 82 04/02/2014 1301   BUN 31* 08/02/2014 1112   BUN 31* 04/02/2014 1301   CREATININE 1.01* 08/02/2014 1112   CREATININE 0.82 04/02/2014 1301   CALCIUM 10.2 08/02/2014 1112   PROT 6.9 08/02/2014 1112   PROT 7.1 04/02/2014 1301   ALBUMIN 4.2 04/02/2014 1301   AST 27 08/02/2014 1112   ALT 21 08/02/2014 1112   ALKPHOS 48 08/02/2014 1112   BILITOT 0.4 08/02/2014 1112   BILITOT 0.4 04/02/2014 1301   GFRNONAA 54* 08/02/2014 1112   GFRAA 62 08/02/2014 1112   Lab Results  Component Value Date   CHOL 178 08/02/2014   HDL 84 08/02/2014   LDLCALC 83 08/02/2014   TRIG 53 08/02/2014   CHOLHDL 2.1 08/02/2014   No results found for: HGBA1C Lab Results  Component Value Date   VITAMINB12 1186* 01/01/2014   Lab Results  Component Value Date   TSH 3.316 12/29/2013    ASSESSMENT AND PLAN  SANVI EHLER is a 80 y.o. female with past medical history of right craniotomy for right-sided meningioma, develop post surgical right MCA stroke, since 2014, she had worsening gait difficulty, significant parkinsonian features,   And has significant parkinsonian features, this happened few years after she recovered from her stroke,  We have reviewed MRI of the brain demonstrate large size right MCA stroke,  Add on sinemet 25/100 three times a day  Physical therapy for gait training   Marcial Pacas, M.D. Ph.D.  [facility]  [address],  [place], [place] [address] Ph: [phone] Fax: [phone]

## 2014-09-01 ENCOUNTER — Encounter (HOSPITAL_COMMUNITY): Payer: Self-pay | Admitting: Physical Therapy

## 2014-09-01 ENCOUNTER — Telehealth (HOSPITAL_COMMUNITY): Payer: Self-pay

## 2014-09-01 ENCOUNTER — Ambulatory Visit (HOSPITAL_COMMUNITY): Payer: Medicare Other | Attending: Neurology | Admitting: Physical Therapy

## 2014-09-01 DIAGNOSIS — M6281 Muscle weakness (generalized): Secondary | ICD-10-CM | POA: Insufficient documentation

## 2014-09-01 DIAGNOSIS — R269 Unspecified abnormalities of gait and mobility: Secondary | ICD-10-CM

## 2014-09-01 DIAGNOSIS — Z7409 Other reduced mobility: Secondary | ICD-10-CM | POA: Insufficient documentation

## 2014-09-01 DIAGNOSIS — G20 Parkinson's disease: Secondary | ICD-10-CM | POA: Insufficient documentation

## 2014-09-01 DIAGNOSIS — R293 Abnormal posture: Secondary | ICD-10-CM | POA: Insufficient documentation

## 2014-09-01 DIAGNOSIS — R531 Weakness: Secondary | ICD-10-CM

## 2014-09-01 DIAGNOSIS — Z9181 History of falling: Secondary | ICD-10-CM

## 2014-09-01 NOTE — Telephone Encounter (Signed)
Patients husband has an appt at the same time and he is her only transportation so she had to cancel.

## 2014-09-01 NOTE — Therapy (Signed)
[place], [place], [address] Phone: [phone]   Fax:  [phone]  Physical Therapy Evaluation  Patient Details  Name: Samantha Hudson MRN: [id number] Date of Birth: 31-Aug-1934 Referring Provider:  Marcial Pacas, MD  Encounter Date: 09/01/2014      PT End of Session - 09/01/14 1704    Visit Number 1   Number of Visits 18   Date for PT Re-Evaluation 09/29/14   Authorization Type Medicare   Authorization Time Period 09/01/14 to 11/01/14   Authorization - Visit Number 1   Authorization - Number of Visits 10   PT Start Time 5329   PT Stop Time 1430   PT Time Calculation (min) 53 min   Activity Tolerance Patient tolerated treatment well   Behavior During Therapy [facility] for tasks assessed/performed      Past Medical History  Diagnosis Date  . Hyperlipemia   . IBS (irritable bowel syndrome)   . Osteoporosis   . Fibromyalgia   . Glaucoma     HAD LASER SURGERY - NO EYE DROPS REQUIRED  . Globus hystericus   . Fibromyalgia   . Meningioma   . CAD (coronary artery disease)     HEART STENT PLACED ABOUT 2000- NO LONGER SEES CARDIOLOGIST; NO C/O OF CHEST PAINS OR SOB  . Heart murmur     MVP SINCE THE 60'S - TAKES ATENOLOL -  . Anxiety   . Swelling     BILATERAL FEET AND LEGS - ONSET OF SWELLING APRIL 2014  . GERD (gastroesophageal reflux disease)   . Arthritis   . Fracture   . CVA (cerebral vascular accident)     STROKE 7 YRS AGO - DAY AFTER BRAIN SURGERY TO REMOVE BENIGN MENINGIOMA - HAS LEFT SIDED WEAKNESS AND A LITTLE NUMBNESS  . Pain     "EXCRUCIATING PAIN" LEFT HIP - HAS A FRACTURE - IS CONFINED TO W/C AT HOME - NO WEIGHT BEARING LEFT HIP.    Past Surgical History  Procedure Laterality Date  . Brain surgery      tumor removed  . Tonsillectomy    . Partial hysterectomy    . Glaucoma repair    . Cataract extraction    . Kidney surgery      left - HX ENLARGED LEFT KIDNEY ON XRAY - SURGERY WAS DONE ON  URETER   . Colonoscopy    . Coronary angioplasty    . Hip arthroplasty Left 12/29/2012    Procedure: LEFT HIP HEMI ARTHROPLASTY ;  Surgeon: Mauri Pole, MD;  Location: [facility];  Service: Orthopedics;  Laterality: Left;    There were no vitals filed for this visit.  Visit Diagnosis:  Parkinsonism - Plan: PT plan of care cert/re-cert  Poor posture - Plan: PT plan of care cert/re-cert  Generalized weakness - Plan: PT plan of care cert/re-cert  Abnormality of gait - Plan: PT plan of care cert/re-cert  Impaired functional mobility, balance, gait, and endurance - Plan: PT plan of care cert/re-cert  Risk for falls - Plan: PT plan of care cert/re-cert      Subjective Assessment - 09/01/14 1653    Subjective Getting out of bed is hard, trouble with coordination; if chairs do not have two arms, patient has a hard time getting up; 2-3 steps to go up in the house that are going OK; incontinence; small voice and writing. Patient also states some short term memory issues. Some occasional issues with Parkinsons freezing,  mild pill rolling tremor present.    Pertinent History Diagnosed with Parkinsons around 10 days ago; also started taking Sinemet about 10 days ago. Patient had some trouble getting over posterior hip surgery she had in 2014, symptoms pointed MD to diagnose new onset Parkinsons.    Currently in Pain? No/denies            [facility] PT Assessment - 09/01/14 0001    Assessment   Medical Diagnosis Parkinsons    Onset Date --  over past year and a half or so    Next MD Visit late April   Precautions   Precautions None   Precaution Comments posterior L hip replacement approximately 2014    Restrictions   Weight Bearing Restrictions No   Balance Screen   Has the patient fallen in the past 6 months No   Has the patient had a decrease in activity level because of a fear of falling?  Yes   Is the patient reluctant to leave their home because of a fear of falling?  Yes   Prior  Function   Level of Independence Other (comment);Needs assistance with ADLs;Requires assistive device for independence;Needs assistance with homemaking  was able to transfer and walk with rolling walker   Vocation Retired   Leisure reading, listening  to music    Observation/Other Assessments   Observations General tendency for posterior loss of balance; classic Parkinsonian presentation in terms of physical qualities and functional abilities    Posture/Postural Control   Posture Comments Very rigid posture; IR of bilateral shoulders with some trunk flexion, very rigid spine and hips; forward head; tendency towards flexed posture overall    Strength   Right Hip ABduction 3/5  assessed sitting edge of bed    Left Hip ABduction 3/5  assessed sitting edge of bed    Right Knee Flexion 3+/5   Right Knee Extension 3+/5   Left Knee Flexion 3+/5   Left Knee Extension 3+/5   Right Ankle Dorsiflexion 3+/5   Left Ankle Dorsiflexion 3+/5   Bed Mobility   Rolling Right 4: Min guard   Rolling Right Details (indicate cue type and reason) cues for sequencing    Rolling Left 4: Min guard   Supine to Sit 4: Min guard   Sitting - Scoot to [employer] of Bed 4: Min guard   Sit to Supine 4: Min guard   Transfers   Sit to Stand 4: Min assist   Sit to Stand Details (indicate cue type and reason) cues for sequencing and form, min assist to prevent posterior loss of balance    Stand to Sit 4: Min guard   Ambulation/Gait   Ambulation/Gait Yes   Ambulation/Gait Assistance 4: Min guard   Ambulation Distance (Feet) 40 Feet  approximate    Assistive device 4-wheeled walker   Gait Pattern Decreased step length - right;Decreased step length - left;Step-through pattern;Decreased stance time - right;Decreased stance time - left;Decreased stride length;Decreased hip/knee flexion - right;Decreased hip/knee flexion - left;Decreased dorsiflexion - right;Decreased dorsiflexion - left;Decreased weight shift to left;Decreased  trunk rotation;Trunk flexed;Wide base of support;Poor foot clearance - left;Poor foot clearance - right   Ambulation Surface Level   High Level Balance   High Level Balance Comments TUG 33.4 seconds, 34.0 seconds, 34.4 seconds                            PT Education - 09/01/14 1703    Education  provided Yes   Education Details General information regarding Parkinsonian symptomology; prognosis; plan of care moving forward with PT; HEP    Person(s) Educated Patient;Spouse   Methods Explanation;Demonstration;Handout   Comprehension Verbalized understanding;Returned demonstration;Need further instruction          PT Short Term Goals - 09/01/14 1713    PT SHORT TERM GOAL #1   Title Patient will demonstrate improved bilateral lower extremity strength to at least 4-/5 in order to improve overall stability and functional mobility skills    Time 3   Period Weeks   Status New   PT SHORT TERM GOAL #2   Title Patient will demonstrate improved gait mechanics and will be able to ambulate at least 497ft with rolling walker and cues for improved mechanics and posture no more than 40% of the time, minimal fatigue   Time 3   Period Weeks   Status New   PT SHORT TERM GOAL #3   Title Patient will demonstrate the ability to perform supine <-->sit with supervision and cues for sequencing no more than 20% of the time   Time 3   Period Weeks   Status New   PT SHORT TERM GOAL #4   Title Patient will be able to perform sit to stand transfer with supervision, cues for proper form no more than 20% of the time and no posterior loss of balance    Time 3   Period Weeks   Status New   PT SHORT TERM GOAL #5   Title Patient and her spouse will be independent in correctly and consistently performing appropriate HEP with emphasis on large, high quality movement patterns    Time 3   Period Weeks   Status New           PT Long Term Goals - 09/01/14 1717    PT LONG TERM GOAL #1    Title Patient will be able to perform TUG assessment in 15 seconds or less on 3/3 attempts, good safety awareness, and good balance throughout test    Time 6   Period Weeks   Status New   PT LONG TERM GOAL #2   Title Patient will be able to ambulate at least 1021ft with LRAD, good posture, improved gait mechanics with equal step lengths, good clearance of floor, improved weight bearing through L leg, and increased stance time bilaterally with no posterior loss of balance and minimal fatigue    Time 6   Period Weeks   Status New   PT LONG TERM GOAL #3   Title Patient will be able to perform rolling, supine to sit, and sit to stand transfer with Mod(I) and rolling walker, good safety awareness, good posture, and high quality movements throughout, minimal fall risk    Time 6   Period Weeks   Status New   PT LONG TERM GOAL #4   Title Patient will as a whole demonstrate the ability to perform large, high quality movement patterns on a consistent basis with minimal posterior loss of balance and minimal fall risk in order to maximize her quality of life and ease of motion   Baseline 6   Period Weeks   Status New   PT LONG TERM GOAL #5   Title Patient and her spouse to be educated regarding BIG movement program/school of thought for people with Parkinsons; also to be educated regarding any available support/activity groups in the local area    Time 6   Period Weeks  Status New               Plan - 09-15-2014 1705    Clinical Impression Statement Patient presents with classic signs of Parkinsons Disease, which was recently diagnosed by her MD. She demonstrates postural impairments, general rigidity and difficulty with mobility, gait impairments, general weakness, reduced speed of motion, tendency for small motions/speech/gestures, requires extended time to perform functional tasks, displays reduced functional activity tolerance, and overall displays reduced ability to perform functional  mobility and functional tasks. The patient and her husband have so far noted a couple of freezing episodes, possibly due to Parkinsonism, and were educated regarding ways to overcome freezing episodes; patient has also just recently started a regimen of Sinemet for her Parkinsonian symptoms. Patient overall very pleasant and motivated to participate in skilled PT. Patient will benefit from skilled Physical Therapy in order to address her functional deficits as well as to assist her in reaching an optimal level of function.    Pt will benefit from skilled therapeutic intervention in order to improve on the following deficits Abnormal gait;Decreased coordination;Difficulty walking;Impaired flexibility;Improper body mechanics;Postural dysfunction;Decreased safety awareness;Decreased endurance;Decreased activity tolerance;Decreased balance;Decreased mobility;Decreased strength;Other (comment)  perceptual difficulties secondary to Parkinsons    Rehab Potential Good   PT Frequency 3x / week   PT Duration 6 weeks   PT Treatment/Interventions ADLs/Self Care Home Management;Gait training;Neuromuscular re-education;Visual/perceptual remediation/compensation;Stair training;Biofeedback;Functional mobility training;Patient/family education;Passive range of motion;Therapeutic activities;Therapeutic exercise;Manual techniques;Energy conservation;DME Instruction;Balance training   PT Next Visit Plan Functional exercises, stretching, and postural training with emphasis on BIG motions and high movement quality. Balance and gait training. Review HEP and goals.    PT Home Exercise Plan Sit to stand, forward and lateral reaching with emphasis on BIG motions, standing sun salutation with emphasis on BIG motion, side stepping with emphasis on BIG steps   Consulted and Agree with Plan of Care Patient          G-Codes - 2014-09-15 1724    Functional Assessment Tool Used Skilled clinical observation and assessment based on  functional mobility, posture, gait impairments, strength, movement quality, and functional activity tolerance    Functional Limitation Mobility: Walking and moving around   Mobility: Walking and Moving Around Current Status [phone]) At least 60 percent but less than 80 percent impaired, limited or restricted   Mobility: Walking and Moving Around Goal Status [phone]) At least 40 percent but less than 60 percent impaired, limited or restricted       Problem List Patient Active Problem List   Diagnosis Date Noted  . Abnormality of gait 08/12/2014  . Parkinsonism 08/12/2014  . Stroke 08/12/2014  . Muscle weakness (generalized) 02/22/2014  . Stiffness of left knee 02/22/2014  . Hamstring tightness of left lower extremity 02/22/2014  . Pain in joint, pelvic region and thigh 02/22/2014  . Difficulty walking 04/07/2013  . Osteoporosis 03/10/2013  . Anemia 03/10/2013  . Mild malnutrition 03/10/2013  . S/P left hip hemiarthroplasty 12/30/2012  . Osteoporosis with pathological fracture with delayed healing 11/25/2012  . Pedal edema 10/27/2012  . Fracture of sacrum 10/02/2012  . Stricture and stenosis of esophagus 09/24/2011  . Hyperlipidemia 11/03/2008  . CAD, UNSPECIFIED SITE 11/03/2008   Deniece Ree PT, DPT [phone]  [place] [address] [place], [place], [address] Phone: [phone]   Fax:  [phone]

## 2014-09-01 NOTE — Patient Instructions (Signed)
Functional Quadriceps: Sit to Stand   Sit on edge of chair, feet flat on floor. Lean forward with your nose over your toes and use your arms to help you stand upright, extending knees fully. Make sure you are standing up with good posture.  Repeat __6__ times per set. Do __1__ sets per session. Do __2__ sessions per day.  http://orth.exer.us/735   Copyright  VHI. All rights reserved.   REACHING EXERCISES (sitting at edge of bed)  1) reach as far as you can up over your head, then reach forward as far as you can without bending a lot at your hips  2) reach side to side as far as you can while sitting at the edge of the bed; think of reaching for the head of the bed, then the foot of the bed   REACHING EXERCISES (standing)  1) in standing with bed behind you, starting with your hands by your side, reach overhead in a big circle so that your hands come together overhead. Think about it as starting with your hands together at the bottom and bring them together again at the top.  Side-Stepping   Walk to left side with eyes open. Take BIG steps, leading with same foot. Make sure each foot lifts off the floor. Repeat in opposite direction. Repeat 4 times. Do __2__ sessions per day.   Copyright  VHI. All rights reserved.

## 2014-09-03 ENCOUNTER — Encounter (HOSPITAL_COMMUNITY): Payer: Medicare Other | Admitting: Physical Therapy

## 2014-09-06 ENCOUNTER — Ambulatory Visit (HOSPITAL_COMMUNITY): Payer: Medicare Other

## 2014-09-06 DIAGNOSIS — R293 Abnormal posture: Secondary | ICD-10-CM

## 2014-09-06 DIAGNOSIS — G20 Parkinson's disease: Secondary | ICD-10-CM | POA: Diagnosis not present

## 2014-09-06 DIAGNOSIS — Z9181 History of falling: Secondary | ICD-10-CM

## 2014-09-06 DIAGNOSIS — M25552 Pain in left hip: Secondary | ICD-10-CM

## 2014-09-06 DIAGNOSIS — R269 Unspecified abnormalities of gait and mobility: Secondary | ICD-10-CM

## 2014-09-06 DIAGNOSIS — M25662 Stiffness of left knee, not elsewhere classified: Secondary | ICD-10-CM

## 2014-09-06 DIAGNOSIS — M6281 Muscle weakness (generalized): Secondary | ICD-10-CM

## 2014-09-06 DIAGNOSIS — M6289 Other specified disorders of muscle: Secondary | ICD-10-CM

## 2014-09-06 DIAGNOSIS — R531 Weakness: Secondary | ICD-10-CM

## 2014-09-06 DIAGNOSIS — Z7409 Other reduced mobility: Secondary | ICD-10-CM

## 2014-09-06 NOTE — Therapy (Signed)
[place], [place], [address] Phone: [phone]   Fax:  [phone]  Physical Therapy Treatment  Patient Details  Name: Samantha Hudson MRN: [id number] Date of Birth: 07/18/1934 Referring Provider:  Kathyrn Drown, MD  Encounter Date: 09/06/2014      PT End of Session - 09/06/14 1358    Visit Number 2   Number of Visits 18   Date for PT Re-Evaluation 09/29/14   Authorization Type Medicare   Authorization Time Period 09/01/14 to 11/01/14   Authorization - Visit Number 2   Authorization - Number of Visits 10   PT Start Time 1300   PT Stop Time 1342   PT Time Calculation (min) 42 min   Equipment Utilized During Treatment Gait belt   Activity Tolerance Patient tolerated treatment well;Patient limited by fatigue   Behavior During Therapy [facility] for tasks assessed/performed      Past Medical History  Diagnosis Date  . Hyperlipemia   . IBS (irritable bowel syndrome)   . Osteoporosis   . Fibromyalgia   . Glaucoma     HAD LASER SURGERY - NO EYE DROPS REQUIRED  . Globus hystericus   . Fibromyalgia   . Meningioma   . CAD (coronary artery disease)     HEART STENT PLACED ABOUT 2000- NO LONGER SEES CARDIOLOGIST; NO C/O OF CHEST PAINS OR SOB  . Heart murmur     MVP SINCE THE 60'S - TAKES ATENOLOL -  . Anxiety   . Swelling     BILATERAL FEET AND LEGS - ONSET OF SWELLING APRIL 2014  . GERD (gastroesophageal reflux disease)   . Arthritis   . Fracture   . CVA (cerebral vascular accident)     STROKE 7 YRS AGO - DAY AFTER BRAIN SURGERY TO REMOVE BENIGN MENINGIOMA - HAS LEFT SIDED WEAKNESS AND A LITTLE NUMBNESS  . Pain     "EXCRUCIATING PAIN" LEFT HIP - HAS A FRACTURE - IS CONFINED TO W/C AT HOME - NO WEIGHT BEARING LEFT HIP.    Past Surgical History  Procedure Laterality Date  . Brain surgery      tumor removed  . Tonsillectomy    . Partial hysterectomy    . Glaucoma repair    . Cataract extraction    .  Kidney surgery      left - HX ENLARGED LEFT KIDNEY ON XRAY - SURGERY WAS DONE ON URETER   . Colonoscopy    . Coronary angioplasty    . Hip arthroplasty Left 12/29/2012    Procedure: LEFT HIP HEMI ARTHROPLASTY ;  Surgeon: Mauri Pole, MD;  Location: [facility];  Service: Orthopedics;  Laterality: Left;    There were no vitals filed for this visit.  Visit Diagnosis:  Parkinsonism  Poor posture  Generalized weakness  Abnormality of gait  Impaired functional mobility, balance, gait, and endurance  Risk for falls  Muscle weakness (generalized)  Stiffness of left knee  Hamstring tightness of left lower extremity  Pain in joint, pelvic region and thigh, left      Subjective Assessment - 09/06/14 1319    Subjective Pt stated she is stiff today, Lt hip pain scale 5/10   Currently in Pain? Yes   Pain Score 5    Pain Location Hip   Pain Orientation Left   Pain Descriptors / Indicators Tightness  [place] Adult PT Treatment/Exercise - 09/06/14 0001    Exercises   Exercises Knee/Hip   Knee/Hip Exercises: Stretches   Hip Flexor Stretch 2 reps;30 seconds   Hip Flexor Stretch Limitations on 2nd step   Knee/Hip Exercises: Standing   Forward Lunges Both;10 reps;3 seconds   Forward Lunges Limitations for hip mobility   Functional Squat 10 reps   Functional Squat Limitations 3D hip excurision with sit to stand as squats   Gait Training BIG steps 2 sets x 226 feet 1st) normalized gait mechanics thinking BIG 2nd) marching   Other Standing Knee Exercises sidestepping thinking big 1RT   Knee/Hip Exercises: Seated   Other Seated Knee Exercises 10 STS no HHA with min cueing              PT Short Term Goals - 09/06/14 1517    PT SHORT TERM GOAL #1   Title Patient will demonstrate improved bilateral lower extremity strength to at least 4-/5 in order to improve overall stability and functional mobility skills    Status On-going   PT SHORT TERM  GOAL #2   Title Patient will demonstrate improved gait mechanics and will be able to ambulate at least 462ft with rolling walker and cues for improved mechanics and posture no more than 40% of the time, minimal fatigue   Status On-going   PT SHORT TERM GOAL #3   Title Patient will demonstrate the ability to perform supine <-->sit with supervision and cues for sequencing no more than 20% of the time   PT SHORT TERM GOAL #4   Title Patient will be able to perform sit to stand transfer with supervision, cues for proper form no more than 20% of the time and no posterior loss of balance    Status On-going   PT SHORT TERM GOAL #5   Title Patient and her spouse will be independent in correctly and consistently performing appropriate HEP with emphasis on large, high quality movement patterns    Status On-going           PT Long Term Goals - 09/06/14 1519    PT LONG TERM GOAL #1   Title Patient will be able to perform TUG assessment in 15 seconds or less on 3/3 attempts, good safety awareness, and good balance throughout test    PT LONG TERM GOAL #2   Title Patient will be able to ambulate at least 1052ft with LRAD, good posture, improved gait mechanics with equal step lengths, good clearance of floor, improved weight bearing through L leg, and increased stance time bilaterally with no posterior loss of balance and minimal fatigue    PT LONG TERM GOAL #3   Title Patient will be able to perform rolling, supine to sit, and sit to stand transfer with Mod(I) and rolling walker, good safety awareness, good posture, and high quality movements throughout, minimal fall risk    PT LONG TERM GOAL #4   Title Patient will as a whole demonstrate the ability to perform large, high quality movement patterns on a consistent basis with minimal posterior loss of balance and minimal fall risk in order to maximize her quality of life and ease of motion   PT LONG TERM GOAL #5   Title Patient and her spouse to be  educated regarding BIG movement program/school of thought for people with Parkinsons; also to be educated regarding any available support/activity groups in the local area  Plan - 09/06/14 1358    Clinical Impression Statement Reviewed HEP for appropriate technique with all exercises.  Began PT POC with focus on BIG gait mechanics and exercises/stretches to improve hip mobilty following reports of feeling stiff today.  MIn assistance required with balance and to complete sit to stands with cueing for proper weight shifting to improve technique.   PT Next Visit Plan Functional exercises, stretching, and postural training with emphasis on BIG motions and high movement quality. Balance and gait training. Review HEP and goals.         Problem List Patient Active Problem List   Diagnosis Date Noted  . Abnormality of gait 08/12/2014  . Parkinsonism 08/12/2014  . Stroke 08/12/2014  . Muscle weakness (generalized) 02/22/2014  . Stiffness of left knee 02/22/2014  . Hamstring tightness of left lower extremity 02/22/2014  . Pain in joint, pelvic region and thigh 02/22/2014  . Difficulty walking 04/07/2013  . Osteoporosis 03/10/2013  . Anemia 03/10/2013  . Mild malnutrition 03/10/2013  . S/P left hip hemiarthroplasty 12/30/2012  . Osteoporosis with pathological fracture with delayed healing 11/25/2012  . Pedal edema 10/27/2012  . Fracture of sacrum 10/02/2012  . Stricture and stenosis of esophagus 09/24/2011  . Hyperlipidemia 11/03/2008  . CAD, UNSPECIFIED SITE 11/03/2008   Ihor Austin, [place]; [place] #[id number] [phone]  Aldona Lento 09/06/2014, 3:21 PM  [place] [address] [place], [place], [address] Phone: [phone]   Fax:  [phone]

## 2014-09-08 ENCOUNTER — Ambulatory Visit (HOSPITAL_COMMUNITY): Payer: Medicare Other

## 2014-09-08 ENCOUNTER — Encounter (HOSPITAL_COMMUNITY): Payer: Medicare Other

## 2014-09-08 DIAGNOSIS — R293 Abnormal posture: Secondary | ICD-10-CM

## 2014-09-08 DIAGNOSIS — M6281 Muscle weakness (generalized): Secondary | ICD-10-CM

## 2014-09-08 DIAGNOSIS — M25552 Pain in left hip: Secondary | ICD-10-CM

## 2014-09-08 DIAGNOSIS — G20 Parkinson's disease: Secondary | ICD-10-CM

## 2014-09-08 DIAGNOSIS — M25662 Stiffness of left knee, not elsewhere classified: Secondary | ICD-10-CM

## 2014-09-08 DIAGNOSIS — Z7409 Other reduced mobility: Secondary | ICD-10-CM

## 2014-09-08 DIAGNOSIS — Z9181 History of falling: Secondary | ICD-10-CM

## 2014-09-08 DIAGNOSIS — R269 Unspecified abnormalities of gait and mobility: Secondary | ICD-10-CM

## 2014-09-08 DIAGNOSIS — R531 Weakness: Secondary | ICD-10-CM

## 2014-09-08 DIAGNOSIS — M6289 Other specified disorders of muscle: Secondary | ICD-10-CM

## 2014-09-08 NOTE — Therapy (Signed)
[place], [place], [address] Phone: [phone]   Fax:  [phone]  Physical Therapy Treatment  Patient Details  Name: Samantha Hudson MRN: [id number] Date of Birth: 1935-02-10 Referring Provider:  Kathyrn Drown, MD  Encounter Date: 09/08/2014      PT End of Session - 09/08/14 1450    Visit Number 3   Number of Visits 18   Date for PT Re-Evaluation 09/29/14   Authorization Type Medicare   Authorization Time Period 09/01/14 to 11/01/14   Authorization - Visit Number 3   Authorization - Number of Visits 10   PT Start Time 1300   PT Stop Time 1350   PT Time Calculation (min) 50 min   Equipment Utilized During Treatment Gait belt   Activity Tolerance Patient limited by fatigue;Patient tolerated treatment well   Behavior During Therapy [facility] for tasks assessed/performed      Past Medical History  Diagnosis Date  . Hyperlipemia   . IBS (irritable bowel syndrome)   . Osteoporosis   . Fibromyalgia   . Glaucoma     HAD LASER SURGERY - NO EYE DROPS REQUIRED  . Globus hystericus   . Fibromyalgia   . Meningioma   . CAD (coronary artery disease)     HEART STENT PLACED ABOUT 2000- NO LONGER SEES CARDIOLOGIST; NO C/O OF CHEST PAINS OR SOB  . Heart murmur     MVP SINCE THE 60'S - TAKES ATENOLOL -  . Anxiety   . Swelling     BILATERAL FEET AND LEGS - ONSET OF SWELLING APRIL 2014  . GERD (gastroesophageal reflux disease)   . Arthritis   . Fracture   . CVA (cerebral vascular accident)     STROKE 7 YRS AGO - DAY AFTER BRAIN SURGERY TO REMOVE BENIGN MENINGIOMA - HAS LEFT SIDED WEAKNESS AND A LITTLE NUMBNESS  . Pain     "EXCRUCIATING PAIN" LEFT HIP - HAS A FRACTURE - IS CONFINED TO W/C AT HOME - NO WEIGHT BEARING LEFT HIP.    Past Surgical History  Procedure Laterality Date  . Brain surgery      tumor removed  . Tonsillectomy    . Partial hysterectomy    . Glaucoma repair    . Cataract extraction    .  Kidney surgery      left - HX ENLARGED LEFT KIDNEY ON XRAY - SURGERY WAS DONE ON URETER   . Colonoscopy    . Coronary angioplasty    . Hip arthroplasty Left 12/29/2012    Procedure: LEFT HIP HEMI ARTHROPLASTY ;  Surgeon: Mauri Pole, MD;  Location: [facility];  Service: Orthopedics;  Laterality: Left;    There were no vitals filed for this visit.  Visit Diagnosis:  Parkinsonism  Poor posture  Generalized weakness  Abnormality of gait  Impaired functional mobility, balance, gait, and endurance  Risk for falls  Muscle weakness (generalized)  Stiffness of left knee  Hamstring tightness of left lower extremity  Pain in joint, pelvic region and thigh, left      Subjective Assessment - 09/08/14 1321    Subjective Pt reports compliance with HEP,  Lt hip pain scale 4/10   Currently in Pain? Yes   Pain Score 4    Pain Location Hip   Pain Orientation Left   Pain Descriptors / Indicators Tightness;Sore              [facility] Adult PT Treatment/Exercise - 09/08/14  0001    Exercises   Exercises Knee/Hip   Knee/Hip Exercises: Stretches   Hip Flexor Stretch 2 reps;30 seconds   Hip Flexor Stretch Limitations on 2nd step   Gastroc Stretch 3 reps;30 seconds   Gastroc Stretch Limitations slant board   Knee/Hip Exercises: Standing   Heel Raises 15 reps   Heel Raises Limitations Toe raises   Functional Squat 15 reps   Functional Squat Limitations 3D hip excursion    Gait Training BIG steps multiple RTs with exaggerated big steps;arm sequence, high marches, knee flexion, heel to toe   Other Standing Knee Exercises sidestepping and retro 2RT   Other Standing Knee Exercises 10 STS             PT Short Term Goals - 09/06/14 1517    PT SHORT TERM GOAL #1   Title Patient will demonstrate improved bilateral lower extremity strength to at least 4-/5 in order to improve overall stability and functional mobility skills    Status On-going   PT SHORT TERM GOAL #2   Title Patient  will demonstrate improved gait mechanics and will be able to ambulate at least 448ft with rolling walker and cues for improved mechanics and posture no more than 40% of the time, minimal fatigue   Status On-going   PT SHORT TERM GOAL #3   Title Patient will demonstrate the ability to perform supine <-->sit with supervision and cues for sequencing no more than 20% of the time   PT SHORT TERM GOAL #4   Title Patient will be able to perform sit to stand transfer with supervision, cues for proper form no more than 20% of the time and no posterior loss of balance    Status On-going   PT SHORT TERM GOAL #5   Title Patient and her spouse will be independent in correctly and consistently performing appropriate HEP with emphasis on large, high quality movement patterns    Status On-going           PT Long Term Goals - 09/08/14 1507    PT LONG TERM GOAL #1   Title Patient will be able to perform TUG assessment in 15 seconds or less on 3/3 attempts, good safety awareness, and good balance throughout test    PT LONG TERM GOAL #2   Title Patient will be able to ambulate at least 1054ft with LRAD, good posture, improved gait mechanics with equal step lengths, good clearance of floor, improved weight bearing through L leg, and increased stance time bilaterally with no posterior loss of balance and minimal fatigue    PT LONG TERM GOAL #3   Title Patient will be able to perform rolling, supine to sit, and sit to stand transfer with Mod(I) and rolling walker, good safety awareness, good posture, and high quality movements throughout, minimal fall risk    PT LONG TERM GOAL #4   Title Patient will as a whole demonstrate the ability to perform large, high quality movement patterns on a consistent basis with minimal posterior loss of balance and minimal fall risk in order to maximize her quality of life and ease of motion   PT LONG TERM GOAL #5   Title Patient and her spouse to be educated regarding BIG movement  program/school of thought for people with Parkinsons; also to be educated regarding any available support/activity groups in the local area                Plan - 09/08/14 1451  Clinical Impression Statement Pt feels confident with HEP exercises.  Session focus on gait training to improve stride length and techniques to improve UE sequence.  Functional strengthening and balance activities to increase ease with sit to stands with multimodal cueing to improve weight bearing to reduce posterior lean.  Min assistance with baance activities and cueing for posture during gait.  No reports of pain through session.   PT Next Visit Plan Functional exercises, stretching, and postural training with emphasis on BIG motions and high movement quality. Balance and gait training.    PT Home Exercise Plan Sit to stand, forward and lateral reaching with emphasis on BIG motions, standing sun salutation with emphasis on BIG motion, side stepping with emphasis on BIG steps        Problem List Patient Active Problem List   Diagnosis Date Noted  . Abnormality of gait 08/12/2014  . Parkinsonism 08/12/2014  . Stroke 08/12/2014  . Muscle weakness (generalized) 02/22/2014  . Stiffness of left knee 02/22/2014  . Hamstring tightness of left lower extremity 02/22/2014  . Pain in joint, pelvic region and thigh 02/22/2014  . Difficulty walking 04/07/2013  . Osteoporosis 03/10/2013  . Anemia 03/10/2013  . Mild malnutrition 03/10/2013  . S/P left hip hemiarthroplasty 12/30/2012  . Osteoporosis with pathological fracture with delayed healing 11/25/2012  . Pedal edema 10/27/2012  . Fracture of sacrum 10/02/2012  . Stricture and stenosis of esophagus 09/24/2011  . Hyperlipidemia 11/03/2008  . CAD, UNSPECIFIED SITE 11/03/2008   Ihor Austin, [place]; [place] #[id number] [phone]  Aldona Lento 09/08/2014, 3:11 PM  [facility] [address] [place], [place],  [address] Phone: [phone]   Fax:  [phone]

## 2014-09-09 ENCOUNTER — Encounter: Payer: Self-pay | Admitting: Neurology

## 2014-09-09 ENCOUNTER — Ambulatory Visit (INDEPENDENT_AMBULATORY_CARE_PROVIDER_SITE_OTHER): Payer: Medicare Other | Admitting: Neurology

## 2014-09-09 VITALS — BP 123/64 | HR 69 | Ht 65.5 in | Wt 103.0 lb

## 2014-09-09 DIAGNOSIS — G20C Parkinsonism, unspecified: Secondary | ICD-10-CM

## 2014-09-09 DIAGNOSIS — I639 Cerebral infarction, unspecified: Secondary | ICD-10-CM | POA: Diagnosis not present

## 2014-09-09 DIAGNOSIS — R269 Unspecified abnormalities of gait and mobility: Secondary | ICD-10-CM

## 2014-09-09 DIAGNOSIS — G20 Parkinson's disease: Secondary | ICD-10-CM

## 2014-09-09 MED ORDER — CARBIDOPA-LEVODOPA 25-100 MG PO TABS: 1.0000 | ORAL_TABLET | Freq: Four times a day (QID) | ORAL | Status: DC

## 2014-09-09 NOTE — Progress Notes (Signed)
PATIENT: Samantha Hudson DOB: 03/14/1935  HISTORICAL  RENELDA KILIAN is a 80 years old right-handed Caucasian female, accompanied by her husband, referred by her primary care physician Dr. Sallee Lange for evaluation of worsening gait difficulty, difficulty initiate gait  She had a history of right meningioma, status post resection in 2007, 2 days later, she suffered a large right MCA stroke, also with past medical history of coronary artery disease, status post stent placement, she recovered very well, was able to drive, ambulate without assistance, but with residual left hemiparesthesia, often unpleasant deep achy pain involving left side of her body,  She had osteoporosis, left hip fracture in 2014, require replacement in August 2014, since surgery, she had a great decline of her ambulatory ability, she now rely on her walker  In 2014, she also developed worsening loss sense of smell, REM sleep disorder, screaming out of her dreams, mild constipation, now she noticed right hand tremor, small handwriting since 2015, mild memory trouble, worsening gait difficulty, difficulty initiate walking using her right leg, tendency to lean backwards, worsening left-sided pain, especially around her left hip, left anterior thigh.  She is taking gabapentin, which has been helpful, but 400 mg 4 times a day, will make her sleepy, she is only taking 900 mg daily now, continue have excessive fatigue, daytime sleepiness, difficult to read, double vision,  Her husband is a retired Software engineer  We have reviewed MRI of the brain August 04 2014, demonstrate large size right MCA stroke, involving right medial, and lateral temporal lobe, no acute lesions.  April 20 first 2016 Since her initial visit August 12 2014, because of mild parkinsonian features, I have started Sinemet 25/100 one tablet 3 times a day, she is taking it at 10, 3, and 9 PM, no significant improvement, no significant side effect, she complains  mild low back pain, continue significant gait difficulty, difficulty picking up her right leg   REVIEW OF SYSTEMS: Full 14 system review of systems performed and notable only for Gait difficulty, ALLERGIES: Allergies  Allergen Reactions  . Lactose Intolerance (Gi)   . Penicillins     RASH - REACTION WAS YEARS AGO    HOME MEDICATIONS: Current Outpatient Prescriptions  Medication Sig Dispense Refill  . atenolol (TENORMIN) 25 MG tablet TAKE (1) TABLET TWICE A DAY. 60 tablet 1  . atorvastatin (LIPITOR) 10 MG tablet TAKE 1 TABLET AT BEDTIME FOR CHOLESTEROL 30 tablet 2  . calcium-vitamin D (OSCAL WITH D) 500-200 MG-UNIT per tablet Take 1 tablet by mouth.    . cholecalciferol (VITAMIN D) 400 UNITS TABS tablet Take 400 Units by mouth daily.    Marland Kitchen dipyridamole-aspirin (AGGRENOX) 200-25 MG per 12 hr capsule TAKE  (1)  CAPSULE  TWICE DAILY. 60 capsule 12  . DULoxetine (CYMBALTA) 30 MG capsule TAKE (1) CAPSULE DAILY. 30 capsule 12  . feeding supplement (ENSURE COMPLETE) LIQD Take 237 mL by mouth daily.    Marland Kitchen gabapentin (NEURONTIN) 100 MG capsule Take one in the afternoon 30 capsule 12  . gabapentin (NEURONTIN) 400 MG capsule Take 1 capsule (400 mg total) by mouth 2 (two) times daily. 60 capsule 12  . polyethylene glycol powder (GLYCOLAX/MIRALAX) powder Take 17 g by mouth 2 (two) times daily. 255 g 0  . potassium chloride (K-DUR) 10 MEQ tablet TAKE (1) TABLET TWICE A DAY. 60 tablet 12  . ranitidine (ZANTAC) 300 MG tablet TAKE (1) TABLET DAILY AS DIRECTED. 30 tablet 12  . torsemide (DEMADEX) 20 MG  tablet Take one half qam 15 tablet 11  . traMADol (ULTRAM) 50 MG tablet Take 1 tablet (50 mg total) by mouth 3 (three) times daily as needed. 60 tablet 5     PAST MEDICAL HISTORY: Past Medical History  Diagnosis Date  . Hyperlipemia   . IBS (irritable bowel syndrome)   . Osteoporosis   . Fibromyalgia   . Glaucoma     HAD LASER SURGERY - NO EYE DROPS REQUIRED  . Globus hystericus   . Fibromyalgia     . Meningioma   . CAD (coronary artery disease)     HEART STENT PLACED ABOUT 2000- NO LONGER SEES CARDIOLOGIST; NO C/O OF CHEST PAINS OR SOB  . Heart murmur     MVP SINCE THE 60'S - TAKES ATENOLOL -  . Anxiety   . Swelling     BILATERAL FEET AND LEGS - ONSET OF SWELLING APRIL 2014  . GERD (gastroesophageal reflux disease)   . Arthritis   . Fracture   . CVA (cerebral vascular accident)     STROKE 7 YRS AGO - DAY AFTER BRAIN SURGERY TO REMOVE BENIGN MENINGIOMA - HAS LEFT SIDED WEAKNESS AND A LITTLE NUMBNESS  . Pain     "EXCRUCIATING PAIN" LEFT HIP - HAS A FRACTURE - IS CONFINED TO W/C AT HOME - NO WEIGHT BEARING LEFT HIP.    PAST SURGICAL HISTORY: Past Surgical History  Procedure Laterality Date  . Brain surgery      tumor removed  . Tonsillectomy    . Partial hysterectomy    . Glaucoma repair    . Cataract extraction    . Kidney surgery      left - HX ENLARGED LEFT KIDNEY ON XRAY - SURGERY WAS DONE ON URETER   . Colonoscopy    . Coronary angioplasty    . Hip arthroplasty Left 12/29/2012    Procedure: LEFT HIP HEMI ARTHROPLASTY ;  Surgeon: Mauri Pole, MD;  Location: [facility];  Service: Orthopedics;  Laterality: Left;    FAMILY HISTORY: Family History  Problem Relation Age of Onset  . Liver disease Father   . Diabetes Father   . Coronary artery disease Mother     deseased  . Heart attack Mother   . Hyperlipidemia Mother   . Breast cancer Sister     x 2  . Cancer Sister     breast    SOCIAL HISTORY:  History   Social History  . Marital Status: Married    Spouse Name: N/A  . Number of Children: 2  . Years of Education: 16   Occupational History  . retired Pharmacist, hospital    Social History Main Topics  . Smoking status: Never Smoker   . Smokeless tobacco: Never Used  . Alcohol Use: No  . Drug Use: No  . Sexual Activity: Not on file   Other Topics Concern  . Not on file   Social History Narrative   Lives at home with husband.   Right- handed   Occasional  caffeine use.     PHYSICAL EXAM   Filed Vitals:   09/09/14 1412  BP: 123/64  Pulse: 69  Height: 5' 5.5" (1.664 m)  Weight: 103 lb (46.72 kg)    Not recorded      Body mass index is 16.87 kg/(m^2).  PHYSICAL EXAMNIATION:  Gen: NAD, conversant, well nourised, obese, well groomed  Cardiovascular: Regular rate rhythm, no peripheral edema, warm, nontender. Eyes: Conjunctivae clear without exudates or hemorrhage Neck: Supple, no carotid bruise. Pulmonary: Clear to auscultation bilaterally   NEUROLOGICAL EXAM:  MENTAL STATUS: Speech:    Speech is normal; fluent and spontaneous with normal comprehension.  Cognition:    T mild mask face, needing mental status examination is 30 out of 30, animal naming is 17  CRANIAL NERVES: CN II: Visual fields are full to confrontation. Fundoscopic exam is normal with sharp discs and no vascular changes. Venous pulsations are present bilaterally. Pupils are 4 mm and briskly reactive to light. Visual acuity is 20/20 bilaterally. CN III, IV, VI: extraocular movement are normal. No ptosis. CN V: Facial sensation is intact to pinprick in all 3 divisions bilaterally. Corneal responses are intact.  CN VII: Face is symmetric with normal eye closure and smile. CN VIII: Hearing is normal to rubbing fingers CN IX, X: Palate elevates symmetrically. Phonation is normal. CN XI: Head turning and shoulder shrug are intact CN XII: Tongue is midline with normal movements and no atrophy.  MOTOR: Left arm pronation drift, mild spasticity, fixation on rapid rotating movement, mild left upper extremity proximal and distal muscle weakness, bradykinesia, worsening with reinforcement maneuver, mild/moderate nuchal Rigidity,  REFLEXES: Reflexes are 2+ and symmetric at the biceps, triceps, knees, and ankles. Plantar responses are flexor.  SENSORY: Decreased light touch, pinprick and vibratory sensation at left side of her body,     COORDINATION: Rapid alternating movements and fine finger movements are intact. There is no dysmetria on finger-to-nose and heel-knee-shin. There are no abnormal or extraneous movements.   GAIT/STANCE: Need assistant to get up from seated position, tendency to leaning back worse, difficulty to clear right foot from floor, dragging her right leg Enblock turning, stiff, small stride   DIAGNOSTIC DATA (LABS, IMAGING, TESTING) - I reviewed patient records, labs, notes, testing and imaging myself where available.  Lab Results  Component Value Date   WBC 5.1 08/02/2014   HGB 12.5 08/02/2014   HCT 38.4 08/02/2014   MCV 91 08/02/2014   PLT 205 08/02/2014      Component Value Date/Time   NA 143 08/02/2014 1112   NA 140 04/02/2014 1301   K 4.7 08/02/2014 1112   CL 99 08/02/2014 1112   CO2 26 08/02/2014 1112   GLUCOSE 87 08/02/2014 1112   GLUCOSE 82 04/02/2014 1301   BUN 31* 08/02/2014 1112   BUN 31* 04/02/2014 1301   CREATININE 1.01* 08/02/2014 1112   CREATININE 0.82 04/02/2014 1301   CALCIUM 10.2 08/02/2014 1112   PROT 6.9 08/02/2014 1112   PROT 7.1 04/02/2014 1301   ALBUMIN 4.2 04/02/2014 1301   AST 27 08/02/2014 1112   ALT 21 08/02/2014 1112   ALKPHOS 48 08/02/2014 1112   BILITOT 0.4 08/02/2014 1112   BILITOT 0.4 04/02/2014 1301   GFRNONAA 54* 08/02/2014 1112   GFRAA 62 08/02/2014 1112   Lab Results  Component Value Date   CHOL 178 08/02/2014   HDL 84 08/02/2014   LDLCALC 83 08/02/2014   TRIG 53 08/02/2014   CHOLHDL 2.1 08/02/2014   No results found for: HGBA1C Lab Results  Component Value Date   VITAMINB12 1186* 01/01/2014   Lab Results  Component Value Date   TSH 3.316 12/29/2013    ASSESSMENT AND PLAN  REANA CHACKO is a 80 y.o. female with past medical history of right craniotomy for right-sided meningioma, develop post surgical right MCA stroke, since 2014, she had  worsening gait difficulty, some parkinsonian features, moderate right ankle  dorsiflexion weakness, dragging her right leg  1, increase Sinemet to 25/100 mg 1 tablet 4 times a day 2, likely a component of right lumbar radiculopathy, MRI of lumbar spine 3, return to clinic in 2 months 4. continue physical therapy  Marcial Pacas, M.D. Ph.D.  [facility] [address], [place], [place] [address] Ph: [phone] Fax: [phone]

## 2014-09-14 ENCOUNTER — Ambulatory Visit (HOSPITAL_COMMUNITY): Payer: Medicare Other | Admitting: Physical Therapy

## 2014-09-14 DIAGNOSIS — R269 Unspecified abnormalities of gait and mobility: Secondary | ICD-10-CM

## 2014-09-14 DIAGNOSIS — R531 Weakness: Secondary | ICD-10-CM

## 2014-09-14 DIAGNOSIS — M6281 Muscle weakness (generalized): Secondary | ICD-10-CM

## 2014-09-14 DIAGNOSIS — Z9181 History of falling: Secondary | ICD-10-CM

## 2014-09-14 DIAGNOSIS — Z7409 Other reduced mobility: Secondary | ICD-10-CM

## 2014-09-14 DIAGNOSIS — G20 Parkinson's disease: Secondary | ICD-10-CM | POA: Diagnosis not present

## 2014-09-14 DIAGNOSIS — R293 Abnormal posture: Secondary | ICD-10-CM

## 2014-09-14 NOTE — Therapy (Signed)
[place], [place], [address] Phone: [phone]   Fax:  [phone]  Physical Therapy Treatment  Patient Details  Name: Samantha Hudson MRN: [id number] Date of Birth: [birth year] Referring Provider:  Marcial Pacas, MD  Encounter Date: 09/14/2014      PT End of Session - 09/14/14 1612    Visit Number 4   Number of Visits 18   Date for PT Re-Evaluation 09/29/14   Authorization Type Medicare   Authorization Time Period 09/01/14 to 11/01/14   Authorization - Visit Number 4   Authorization - Number of Visits 10   PT Start Time 6195   PT Stop Time 1432   PT Time Calculation (min) 45 min   Activity Tolerance Patient tolerated treatment well   Behavior During Therapy [facility] for tasks assessed/performed      Past Medical History  Diagnosis Date  . Hyperlipemia   . IBS (irritable bowel syndrome)   . Osteoporosis   . Fibromyalgia   . Glaucoma     HAD LASER SURGERY - NO EYE DROPS REQUIRED  . Globus hystericus   . Fibromyalgia   . Meningioma   . CAD (coronary artery disease)     HEART STENT PLACED ABOUT 2000- NO LONGER SEES CARDIOLOGIST; NO C/O OF CHEST PAINS OR SOB  . Heart murmur     MVP SINCE THE 60'S - TAKES ATENOLOL -  . Anxiety   . Swelling     BILATERAL FEET AND LEGS - ONSET OF SWELLING APRIL 2014  . GERD (gastroesophageal reflux disease)   . Arthritis   . Fracture   . CVA (cerebral vascular accident)     STROKE 7 YRS AGO - DAY AFTER BRAIN SURGERY TO REMOVE BENIGN MENINGIOMA - HAS LEFT SIDED WEAKNESS AND A LITTLE NUMBNESS  . Pain     "EXCRUCIATING PAIN" LEFT HIP - HAS A FRACTURE - IS CONFINED TO W/C AT HOME - NO WEIGHT BEARING LEFT HIP.    Past Surgical History  Procedure Laterality Date  . Brain surgery      tumor removed  . Tonsillectomy    . Partial hysterectomy    . Glaucoma repair    . Cataract extraction    . Kidney surgery      left - HX ENLARGED LEFT KIDNEY ON XRAY - SURGERY WAS DONE ON  URETER   . Colonoscopy    . Coronary angioplasty    . Hip arthroplasty Left 12/29/2012    Procedure: LEFT HIP HEMI ARTHROPLASTY ;  Surgeon: Mauri Pole, MD;  Location: [facility];  Service: Orthopedics;  Laterality: Left;    There were no vitals filed for this visit.  Visit Diagnosis:  Parkinsonism  Poor posture  Generalized weakness  Abnormality of gait  Impaired functional mobility, balance, gait, and endurance  Risk for falls  Muscle weakness (generalized)      Subjective Assessment - 09/14/14 1605    Subjective Patient reports that she is doing well today, no pain and very pleasant overall, continuing to keep up with HEP    Pertinent History Diagnosed with Parkinsons around 10 days ago; also started taking Sinemet about 10 days ago. Patient had some trouble getting over posterior hip surgery she had in 2014, symptoms pointed MD to diagnose new onset Parkinsons.    Currently in Pain? No/denies  [place] Adult PT Treatment/Exercise - 09/14/14 0001    Ambulation/Gait   Ambulation/Gait Yes   Ambulation/Gait Assistance 4: Min guard   Ambulation Distance (Feet) --  8ft x approx 4 laps; 249ft with metronome at 40SPM   Assistive device 4-wheeled walker   Gait Pattern Step-through pattern;Decreased step length - right;Decreased step length - left;Decreased stance time - left;Decreased dorsiflexion - left;Decreased dorsiflexion - right;Decreased weight shift to left;Decreased trunk rotation;Trunk flexed;Narrow base of support   Ambulation Surface Level;Indoor   Gait Comments Performed gait with metronome at 40SPM with focus on improved step length, posture, foot clearance, dynamic balance during gait   Lumbar Exercises: Supine   Other Supine Lumbar Exercises Full body stretch in supine on table with focus on ABD/ER of B UEs, cervical extension, and hip mobility within hip precautions   Knee/Hip Exercises: Standing   Other Standing Knee  Exercises Reciprocal UE/LE flexion with metronome for improved coordination, posture, and reciprocal motion   Other Standing Knee Exercises Forwards gait with therapist facilitation of trunk rotation and metronome at 40SPM; performed both with and without theraband assist                 PT Education - 09/14/14 1611    Education provided Yes   Education Details Education on use of metronome to assist in smoothness of motion; techniques to assist in reducing/working through freezing episodes    Person(s) Educated Patient   Methods Explanation   Comprehension Verbalized understanding          PT Short Term Goals - 09/06/14 1517    PT SHORT TERM GOAL #1   Title Patient will demonstrate improved bilateral lower extremity strength to at least 4-/5 in order to improve overall stability and functional mobility skills    Status On-going   PT SHORT TERM GOAL #2   Title Patient will demonstrate improved gait mechanics and will be able to ambulate at least 454ft with rolling walker and cues for improved mechanics and posture no more than 40% of the time, minimal fatigue   Status On-going   PT SHORT TERM GOAL #3   Title Patient will demonstrate the ability to perform supine <-->sit with supervision and cues for sequencing no more than 20% of the time   PT SHORT TERM GOAL #4   Title Patient will be able to perform sit to stand transfer with supervision, cues for proper form no more than 20% of the time and no posterior loss of balance    Status On-going   PT SHORT TERM GOAL #5   Title Patient and her spouse will be independent in correctly and consistently performing appropriate HEP with emphasis on large, high quality movement patterns    Status On-going           PT Long Term Goals - 09/08/14 1507    PT LONG TERM GOAL #1   Title Patient will be able to perform TUG assessment in 15 seconds or less on 3/3 attempts, good safety awareness, and good balance throughout test    PT LONG  TERM GOAL #2   Title Patient will be able to ambulate at least 1079ft with LRAD, good posture, improved gait mechanics with equal step lengths, good clearance of floor, improved weight bearing through L leg, and increased stance time bilaterally with no posterior loss of balance and minimal fatigue    PT LONG TERM GOAL #3   Title Patient will be able to perform rolling, supine to sit, and sit  to stand transfer with Mod(I) and rolling walker, good safety awareness, good posture, and high quality movements throughout, minimal fall risk    PT LONG TERM GOAL #4   Title Patient will as a whole demonstrate the ability to perform large, high quality movement patterns on a consistent basis with minimal posterior loss of balance and minimal fall risk in order to maximize her quality of life and ease of motion   PT LONG TERM GOAL #5   Title Patient and her spouse to be educated regarding BIG movement program/school of thought for people with Parkinsons; also to be educated regarding any available support/activity groups in the local area                Plan - 09/14/14 1612    Clinical Impression Statement Focused on full body stretching and postural techniques, reciprocal motion, and gait training with improved mechanics and improved trunk rotation today. Patient advised to lay on her back with no pillows and upper extremities in ABD/ER to promote postural stretching at home. Calibrated metronome to approximately 50SPM initially however a metronome beat of 40SPM does appear more appropriate for the patient. Therapist facilitation of improved gait mechanics using metronome and verbal cues; also focused on trunk rotation today with and without use of therband assist on bilateral lower extremities. Patient participated in and tolerated session well, responded very well to metronome but did require occasional verbal cues to overcome occasional freezing episodes throughout session.    Pt will benefit from  skilled therapeutic intervention in order to improve on the following deficits Abnormal gait;Decreased coordination;Difficulty walking;Impaired flexibility;Improper body mechanics;Postural dysfunction;Decreased safety awareness;Decreased endurance;Decreased activity tolerance;Decreased balance;Decreased mobility;Decreased strength;Other (comment)   Rehab Potential Good   PT Frequency 3x / week   PT Duration 6 weeks   PT Treatment/Interventions ADLs/Self Care Home Management;Gait training;Neuromuscular re-education;Visual/perceptual remediation/compensation;Stair training;Biofeedback;Functional mobility training;Patient/family education;Passive range of motion;Therapeutic activities;Therapeutic exercise;Manual techniques;Energy conservation;DME Instruction;Balance training   PT Next Visit Plan Continue functional full body stretch techniques; focus on use of metronome at 40SPM during standing reciprocal tasks; trunk rotation during gait; trial Nustep to assist in promoting reciprocal motion   PT Home Exercise Plan Sit to stand, forward and lateral reaching with emphasis on BIG motions, standing sun salutation with emphasis on BIG motion, side stepping with emphasis on BIG steps   Consulted and Agree with Plan of Care Patient        Problem List Patient Active Problem List   Diagnosis Date Noted  . Abnormality of gait 08/12/2014  . Parkinsonism 08/12/2014  . Stroke 08/12/2014  . Muscle weakness (generalized) 02/22/2014  . Stiffness of left knee 02/22/2014  . Hamstring tightness of left lower extremity 02/22/2014  . Pain in joint, pelvic region and thigh 02/22/2014  . Difficulty walking 04/07/2013  . Osteoporosis 03/10/2013  . Anemia 03/10/2013  . Mild malnutrition 03/10/2013  . S/P left hip hemiarthroplasty 12/30/2012  . Osteoporosis with pathological fracture with delayed healing 11/25/2012  . Pedal edema 10/27/2012  . Fracture of sacrum 10/02/2012  . Stricture and stenosis of esophagus  09/24/2011  . Hyperlipidemia 11/03/2008  . CAD, UNSPECIFIED SITE 11/03/2008   Deniece Ree PT, DPT [phone]  [place] [address] [place], [place], [address] Phone: [phone]   Fax:  [phone]

## 2014-09-15 ENCOUNTER — Telehealth: Payer: Self-pay | Admitting: Neurology

## 2014-09-15 NOTE — Telephone Encounter (Signed)
LVM to advise pt of MRI appt at [facility] hospital Wednesday May 4 9:45 AM [phone] (please call [facility] if pt needs to reschedule)

## 2014-09-16 ENCOUNTER — Ambulatory Visit (HOSPITAL_COMMUNITY): Payer: Medicare Other

## 2014-09-16 DIAGNOSIS — M25552 Pain in left hip: Secondary | ICD-10-CM

## 2014-09-16 DIAGNOSIS — R269 Unspecified abnormalities of gait and mobility: Secondary | ICD-10-CM

## 2014-09-16 DIAGNOSIS — M6289 Other specified disorders of muscle: Secondary | ICD-10-CM

## 2014-09-16 DIAGNOSIS — Z7409 Other reduced mobility: Secondary | ICD-10-CM

## 2014-09-16 DIAGNOSIS — G20 Parkinson's disease: Secondary | ICD-10-CM

## 2014-09-16 DIAGNOSIS — Z9181 History of falling: Secondary | ICD-10-CM

## 2014-09-16 DIAGNOSIS — M25662 Stiffness of left knee, not elsewhere classified: Secondary | ICD-10-CM

## 2014-09-16 DIAGNOSIS — R293 Abnormal posture: Secondary | ICD-10-CM

## 2014-09-16 DIAGNOSIS — R531 Weakness: Secondary | ICD-10-CM

## 2014-09-16 DIAGNOSIS — M6281 Muscle weakness (generalized): Secondary | ICD-10-CM

## 2014-09-16 DIAGNOSIS — G20C Parkinsonism, unspecified: Secondary | ICD-10-CM

## 2014-09-16 NOTE — Therapy (Signed)
[place] [address] [place], [place], [address] Phone: [phone]   Fax:  [phone]  Physical Therapy Treatment  Patient Details  Name: Samantha Hudson MRN: [id number] Date of Birth: May 21, 1935 Referring Provider:  Marcial Pacas, MD  Encounter Date: 09/16/2014      PT End of Session - 09/16/14 1513    Visit Number 5   Number of Visits 18   Date for PT Re-Evaluation 09/29/14   Authorization Type Medicare   Authorization Time Period 09/01/14 to 11/01/14   Authorization - Visit Number 5   Authorization - Number of Visits 10   PT Start Time 0086   PT Stop Time 1518   PT Time Calculation (min) 44 min   Equipment Utilized During Treatment Gait belt   Activity Tolerance Patient tolerated treatment well   Behavior During Therapy [facility] for tasks assessed/performed      Past Medical History  Diagnosis Date  . Hyperlipemia   . IBS (irritable bowel syndrome)   . Osteoporosis   . Fibromyalgia   . Glaucoma     HAD LASER SURGERY - NO EYE DROPS REQUIRED  . Globus hystericus   . Fibromyalgia   . Meningioma   . CAD (coronary artery disease)     HEART STENT PLACED ABOUT 2000- NO LONGER SEES CARDIOLOGIST; NO C/O OF CHEST PAINS OR SOB  . Heart murmur     MVP SINCE THE 60'S - TAKES ATENOLOL -  . Anxiety   . Swelling     BILATERAL FEET AND LEGS - ONSET OF SWELLING APRIL 2014  . GERD (gastroesophageal reflux disease)   . Arthritis   . Fracture   . CVA (cerebral vascular accident)     STROKE 7 YRS AGO - DAY AFTER BRAIN SURGERY TO REMOVE BENIGN MENINGIOMA - HAS LEFT SIDED WEAKNESS AND A LITTLE NUMBNESS  . Pain     "EXCRUCIATING PAIN" LEFT HIP - HAS A FRACTURE - IS CONFINED TO W/C AT HOME - NO WEIGHT BEARING LEFT HIP.    Past Surgical History  Procedure Laterality Date  . Brain surgery      tumor removed  . Tonsillectomy    . Partial hysterectomy    . Glaucoma repair    . Cataract extraction    . Kidney surgery      left - HX  ENLARGED LEFT KIDNEY ON XRAY - SURGERY WAS DONE ON URETER   . Colonoscopy    . Coronary angioplasty    . Hip arthroplasty Left 12/29/2012    Procedure: LEFT HIP HEMI ARTHROPLASTY ;  Surgeon: Mauri Pole, MD;  Location: [facility];  Service: Orthopedics;  Laterality: Left;    There were no vitals filed for this visit.  Visit Diagnosis:  Parkinsonism  Poor posture  Generalized weakness  Abnormality of gait  Impaired functional mobility, balance, gait, and endurance  Muscle weakness (generalized)  Risk for falls  Stiffness of left knee  Hamstring tightness of left lower extremity  Pain in joint, pelvic region and thigh, left      Subjective Assessment - 09/16/14 1439    Subjective Pt stated she felt good following last session, stated her back is a little sore today completeing housework today.     Currently in Pain? No/denies   Pain Descriptors / Indicators Sore;Tightness              [facility] Adult PT Treatment/Exercise - 09/16/14 0001    Ambulation/Gait   Ambulation/Gait Yes  Ambulation/Gait Assistance 4: Min guard   Assistive device 4-wheeled walker   Gait Pattern Step-through pattern;Decreased step length - right;Decreased step length - left;Decreased stance time - left;Decreased dorsiflexion - left;Decreased dorsiflexion - right;Decreased weight shift to left;Decreased trunk rotation;Trunk flexed;Narrow base of support   Ambulation Surface Level;Indoor   Gait Comments Performed gait with metronome at 40SPM with focus on improved step length, posture, foot clearance, dynamic balance during gait   Exercises   Exercises Knee/Hip   Lumbar Exercises: Supine   Other Supine Lumbar Exercises Full body stretch in supine on table with focus on ABD/ER of B UEs, cervical extension, and hip mobility within hip precautions   Knee/Hip Exercises: Standing   Gait Training Forwards gait with therapist facilitation of trunk rotation and metronome at 40SPM; performed both with and  without theraband assist    Other Standing Knee Exercises Reciprocal UE/LE flexion with metronome for improved coordination, posture, and reciprocal motion   Knee/Hip Exercises: Seated   Other Seated Knee Exercises 2 sets 5 reps scooting to EOB then sit to stand              PT Short Term Goals - 09/16/14 1812    PT SHORT TERM GOAL #1   Title Patient will demonstrate improved bilateral lower extremity strength to at least 4-/5 in order to improve overall stability and functional mobility skills    Status On-going   PT SHORT TERM GOAL #2   Title Patient will demonstrate improved gait mechanics and will be able to ambulate at least 420ft with rolling walker and cues for improved mechanics and posture no more than 40% of the time, minimal fatigue   Status On-going   PT SHORT TERM GOAL #3   Title Patient will demonstrate the ability to perform supine <-->sit with supervision and cues for sequencing no more than 20% of the time   Status On-going   PT SHORT TERM GOAL #4   Title Patient will be able to perform sit to stand transfer with supervision, cues for proper form no more than 20% of the time and no posterior loss of balance    Status On-going   PT SHORT TERM GOAL #5   Title Patient and her spouse will be independent in correctly and consistently performing appropriate HEP with emphasis on large, high quality movement patterns    Status On-going           PT Long Term Goals - 09/16/14 1812    PT LONG TERM GOAL #1   Title Patient will be able to perform TUG assessment in 15 seconds or less on 3/3 attempts, good safety awareness, and good balance throughout test    PT LONG TERM GOAL #2   Title Patient will be able to ambulate at least 1067ft with LRAD, good posture, improved gait mechanics with equal step lengths, good clearance of floor, improved weight bearing through L leg, and increased stance time bilaterally with no posterior loss of balance and minimal fatigue    PT LONG  TERM GOAL #3   Title Patient will be able to perform rolling, supine to sit, and sit to stand transfer with Mod(I) and rolling walker, good safety awareness, good posture, and high quality movements throughout, minimal fall risk    PT LONG TERM GOAL #4   Title Patient will as a whole demonstrate the ability to perform large, high quality movement patterns on a consistent basis with minimal posterior loss of balance and minimal fall risk in order  to maximize her quality of life and ease of motion   PT LONG TERM GOAL #5   Title Patient and her spouse to be educated regarding BIG movement program/school of thought for people with Parkinsons; also to be educated regarding any available support/activity groups in the local area                Plan - 09/16/14 1513    Clinical Impression Statement Session focus on improving gait sequencing and cadence utilizing metronome to approximately 50 SPM.  Pt educated on scooting techniques to EOB to assist with assist with ability with sit to stand with increase ease.  Pt required cueing for posture and therapist assistance to reduce posterior leans with sit to stands.   PT Home Exercise Plan Sit to stand, forward and lateral reaching with emphasis on BIG motions, standing sun salutation with emphasis on BIG motion, side stepping with emphasis on BIG steps.  Continue full body stretches initially for session and gait training with metronome for cadence.        Problem List Patient Active Problem List   Diagnosis Date Noted  . Abnormality of gait 08/12/2014  . Parkinsonism 08/12/2014  . Stroke 08/12/2014  . Muscle weakness (generalized) 02/22/2014  . Stiffness of left knee 02/22/2014  . Hamstring tightness of left lower extremity 02/22/2014  . Pain in joint, pelvic region and thigh 02/22/2014  . Difficulty walking 04/07/2013  . Osteoporosis 03/10/2013  . Anemia 03/10/2013  . Mild malnutrition 03/10/2013  . S/P left hip hemiarthroplasty  12/30/2012  . Osteoporosis with pathological fracture with delayed healing 11/25/2012  . Pedal edema 10/27/2012  . Fracture of sacrum 10/02/2012  . Stricture and stenosis of esophagus 09/24/2011  . Hyperlipidemia 11/03/2008  . CAD, UNSPECIFIED SITE 11/03/2008   Ihor Austin, [place]; [place] #[id number] [phone]  Aldona Lento 09/16/2014, 6:14 PM  [facility] [address] [place], [place], [address] Phone: [phone]   Fax:  [phone]

## 2014-09-20 ENCOUNTER — Ambulatory Visit (HOSPITAL_COMMUNITY): Payer: Medicare Other | Attending: Neurology | Admitting: Physical Therapy

## 2014-09-20 DIAGNOSIS — R293 Abnormal posture: Secondary | ICD-10-CM | POA: Insufficient documentation

## 2014-09-20 DIAGNOSIS — R531 Weakness: Secondary | ICD-10-CM

## 2014-09-20 DIAGNOSIS — G20 Parkinson's disease: Secondary | ICD-10-CM | POA: Insufficient documentation

## 2014-09-20 DIAGNOSIS — M6281 Muscle weakness (generalized): Secondary | ICD-10-CM | POA: Diagnosis not present

## 2014-09-20 DIAGNOSIS — Z7409 Other reduced mobility: Secondary | ICD-10-CM | POA: Insufficient documentation

## 2014-09-20 DIAGNOSIS — M6289 Other specified disorders of muscle: Secondary | ICD-10-CM

## 2014-09-20 DIAGNOSIS — M25552 Pain in left hip: Secondary | ICD-10-CM

## 2014-09-20 DIAGNOSIS — R269 Unspecified abnormalities of gait and mobility: Secondary | ICD-10-CM | POA: Diagnosis not present

## 2014-09-20 DIAGNOSIS — M25662 Stiffness of left knee, not elsewhere classified: Secondary | ICD-10-CM

## 2014-09-20 DIAGNOSIS — Z9181 History of falling: Secondary | ICD-10-CM

## 2014-09-20 NOTE — Therapy (Signed)
[facility] [address] [place], [place], [address] Phone: [phone]   Fax:  [phone]  Physical Therapy Treatment  Patient Details  Name: Samantha Hudson MRN: [id number] Date of Birth: 29-Jan-1935 Referring Provider:  Marcial Pacas, MD  Encounter Date: 09/20/2014      PT End of Session - 09/20/14 1439    Visit Number 6   Number of Visits 18   Date for PT Re-Evaluation 09/29/14   Authorization Type Medicare   Authorization Time Period 09/01/14 to 11/01/14   Authorization - Visit Number 6   Authorization - Number of Visits 10   PT Start Time 6222   PT Stop Time 1350   PT Time Calculation (min) 45 min   Equipment Utilized During Treatment Gait belt   Activity Tolerance Patient tolerated treatment well   Behavior During Therapy [facility] for tasks assessed/performed      Past Medical History  Diagnosis Date  . Hyperlipemia   . IBS (irritable bowel syndrome)   . Osteoporosis   . Fibromyalgia   . Glaucoma     HAD LASER SURGERY - NO EYE DROPS REQUIRED  . Globus hystericus   . Fibromyalgia   . Meningioma   . CAD (coronary artery disease)     HEART STENT PLACED ABOUT 2000- NO LONGER SEES CARDIOLOGIST; NO C/O OF CHEST PAINS OR SOB  . Heart murmur     MVP SINCE THE 60'S - TAKES ATENOLOL -  . Anxiety   . Swelling     BILATERAL FEET AND LEGS - ONSET OF SWELLING APRIL 2014  . GERD (gastroesophageal reflux disease)   . Arthritis   . Fracture   . CVA (cerebral vascular accident)     STROKE 7 YRS AGO - DAY AFTER BRAIN SURGERY TO REMOVE BENIGN MENINGIOMA - HAS LEFT SIDED WEAKNESS AND A LITTLE NUMBNESS  . Pain     "EXCRUCIATING PAIN" LEFT HIP - HAS A FRACTURE - IS CONFINED TO W/C AT HOME - NO WEIGHT BEARING LEFT HIP.    Past Surgical History  Procedure Laterality Date  . Brain surgery      tumor removed  . Tonsillectomy    . Partial hysterectomy    . Glaucoma repair    . Cataract extraction    . Kidney surgery      left - HX  ENLARGED LEFT KIDNEY ON XRAY - SURGERY WAS DONE ON URETER   . Colonoscopy    . Coronary angioplasty    . Hip arthroplasty Left 12/29/2012    Procedure: LEFT HIP HEMI ARTHROPLASTY ;  Surgeon: Mauri Pole, MD;  Location: [facility];  Service: Orthopedics;  Laterality: Left;    There were no vitals filed for this visit.  Visit Diagnosis:  Hamstring tightness of left lower extremity  Poor posture  Generalized weakness  Abnormality of gait  Impaired functional mobility, balance, gait, and endurance  Muscle weakness (generalized)  Risk for falls  Stiffness of left knee  Parkinsonism  Pain in joint, pelvic region and thigh, left      Subjective Assessment - 09/20/14 1348    Subjective Pt states she feels good, however her back is still a little sore.  States she walks inside her home some without AD.     Currently in Pain? No/denies                         [facility] Adult PT Treatment/Exercise - 09/20/14 1439  Ambulation/Gait   Ambulation/Gait Yes   Ambulation/Gait Assistance 4: Min guard   Ambulation Distance (Feet) 225 Feet  2X with metronome at 40-45   Assistive device None   Gait Pattern Step-through pattern;Decreased step length - right;Decreased step length - left;Decreased stance time - left;Decreased dorsiflexion - left;Decreased dorsiflexion - right;Decreased weight shift to left;Decreased trunk rotation;Trunk flexed;Narrow base of support   Gait Comments Performed gait with metronome at 40SPM with focus on improved step length, posture, foot clearance, dynamic balance during gait   Lumbar Exercises: Supine   Other Supine Lumbar Exercises Full body stretch in supine 5 minutes on table with focus on ABD/ER of B UEs, cervical extension, and hip mobility within hip precautions   Other Supine Lumbar Exercises manual hamstring stretch bilaterally, Lt knee extension and dorsiflexion PROM   Knee/Hip Exercises: Aerobic   Stationary Bike nustep UE/LE 10 minutes  level 2 hills #2, 10 minutes.  Seat 6  43SPM average   Knee/Hip Exercises: Seated   Other Seated Knee Exercises 2 sets 5 reps scooting to EOB then sit to stand                  PT Short Term Goals - 09/16/14 1812    PT SHORT TERM GOAL #1   Title Patient will demonstrate improved bilateral lower extremity strength to at least 4-/5 in order to improve overall stability and functional mobility skills    Status On-going   PT SHORT TERM GOAL #2   Title Patient will demonstrate improved gait mechanics and will be able to ambulate at least 469ft with rolling walker and cues for improved mechanics and posture no more than 40% of the time, minimal fatigue   Status On-going   PT SHORT TERM GOAL #3   Title Patient will demonstrate the ability to perform supine <-->sit with supervision and cues for sequencing no more than 20% of the time   Status On-going   PT SHORT TERM GOAL #4   Title Patient will be able to perform sit to stand transfer with supervision, cues for proper form no more than 20% of the time and no posterior loss of balance    Status On-going   PT SHORT TERM GOAL #5   Title Patient and her spouse will be independent in correctly and consistently performing appropriate HEP with emphasis on large, high quality movement patterns    Status On-going           PT Long Term Goals - 09/16/14 1812    PT LONG TERM GOAL #1   Title Patient will be able to perform TUG assessment in 15 seconds or less on 3/3 attempts, good safety awareness, and good balance throughout test    PT LONG TERM GOAL #2   Title Patient will be able to ambulate at least 1033ft with LRAD, good posture, improved gait mechanics with equal step lengths, good clearance of floor, improved weight bearing through L leg, and increased stance time bilaterally with no posterior loss of balance and minimal fatigue    PT LONG TERM GOAL #3   Title Patient will be able to perform rolling, supine to sit, and sit to stand  transfer with Mod(I) and rolling walker, good safety awareness, good posture, and high quality movements throughout, minimal fall risk    PT LONG TERM GOAL #4   Title Patient will as a whole demonstrate the ability to perform large, high quality movement patterns on a consistent basis with minimal posterior loss of  balance and minimal fall risk in order to maximize her quality of life and ease of motion   PT LONG TERM GOAL #5   Title Patient and her spouse to be educated regarding BIG movement program/school of thought for people with Parkinsons; also to be educated regarding any available support/activity groups in the local area                Plan - 09/20/14 1440    Clinical Impression Statement Continued focus on improving gait sequence/cadence utilizing metronome and increasing ROM/posture.  Began gait without use of RW with metronome at 45.  Pt able to utilize UE swing approx 40% of the time with VC's.  Therapist also completed HHA UE movments along with gait.  Added nustep to further work on increasing reciprocally movements and coordination of UE/LE with functional activities.     PT Home Exercise Plan Sit to stand, forward and lateral reaching with emphasis on BIG motions, standing sun salutation with emphasis on BIG motion, side stepping with emphasis on BIG steps.  Continue full body stretches initially for session and gait training with metronome for cadence.        Problem List Patient Active Problem List   Diagnosis Date Noted  . Abnormality of gait 08/12/2014  . Parkinsonism 08/12/2014  . Stroke 08/12/2014  . Muscle weakness (generalized) 02/22/2014  . Stiffness of left knee 02/22/2014  . Hamstring tightness of left lower extremity 02/22/2014  . Pain in joint, pelvic region and thigh 02/22/2014  . Difficulty walking 04/07/2013  . Osteoporosis 03/10/2013  . Anemia 03/10/2013  . Mild malnutrition 03/10/2013  . S/P left hip hemiarthroplasty 12/30/2012  . Osteoporosis  with pathological fracture with delayed healing 11/25/2012  . Pedal edema 10/27/2012  . Fracture of sacrum 10/02/2012  . Stricture and stenosis of esophagus 09/24/2011  . Hyperlipidemia 11/03/2008  . CAD, UNSPECIFIED SITE 11/03/2008    Teena Irani, PTA/CLT [phone] 09/20/2014, 2:44 PM  [facility] [address] [place], [place], [address] Phone: [phone]   Fax:  [phone]

## 2014-09-22 ENCOUNTER — Ambulatory Visit (HOSPITAL_COMMUNITY): Payer: Medicare Other

## 2014-09-22 ENCOUNTER — Ambulatory Visit (HOSPITAL_COMMUNITY): Payer: Medicare Other | Admitting: Physical Therapy

## 2014-09-22 DIAGNOSIS — M6281 Muscle weakness (generalized): Secondary | ICD-10-CM

## 2014-09-22 DIAGNOSIS — R293 Abnormal posture: Secondary | ICD-10-CM

## 2014-09-22 DIAGNOSIS — G20 Parkinson's disease: Secondary | ICD-10-CM

## 2014-09-22 DIAGNOSIS — Z9181 History of falling: Secondary | ICD-10-CM

## 2014-09-22 DIAGNOSIS — R531 Weakness: Secondary | ICD-10-CM

## 2014-09-22 DIAGNOSIS — M25662 Stiffness of left knee, not elsewhere classified: Secondary | ICD-10-CM

## 2014-09-22 DIAGNOSIS — Z7409 Other reduced mobility: Secondary | ICD-10-CM

## 2014-09-22 DIAGNOSIS — R269 Unspecified abnormalities of gait and mobility: Secondary | ICD-10-CM

## 2014-09-22 DIAGNOSIS — M6289 Other specified disorders of muscle: Secondary | ICD-10-CM

## 2014-09-22 NOTE — Therapy (Signed)
[facility] [address] [place], [place], [address] Phone: [phone]   Fax:  [phone]  Physical Therapy Treatment  Patient Details  Name: Samantha Hudson MRN: [id number] Date of Birth: [birth year] Referring Provider:  Marcial Pacas, MD  Encounter Date: 09/22/2014      PT End of Session - 09/22/14 1706    Visit Number 7   Number of Visits 18   Date for PT Re-Evaluation 09/29/14   Authorization Type Medicare   Authorization Time Period 09/01/14 to 11/01/14   Authorization - Visit Number 7   Authorization - Number of Visits 10   PT Start Time 1300   PT Stop Time 1345   PT Time Calculation (min) 45 min   Equipment Utilized During Treatment Gait belt   Activity Tolerance Patient tolerated treatment well   Behavior During Therapy [facility] for tasks assessed/performed      Past Medical History  Diagnosis Date  . Hyperlipemia   . IBS (irritable bowel syndrome)   . Osteoporosis   . Fibromyalgia   . Glaucoma     HAD LASER SURGERY - NO EYE DROPS REQUIRED  . Globus hystericus   . Fibromyalgia   . Meningioma   . CAD (coronary artery disease)     HEART STENT PLACED ABOUT 2000- NO LONGER SEES CARDIOLOGIST; NO C/O OF CHEST PAINS OR SOB  . Heart murmur     MVP SINCE THE 60'S - TAKES ATENOLOL -  . Anxiety   . Swelling     BILATERAL FEET AND LEGS - ONSET OF SWELLING APRIL 2014  . GERD (gastroesophageal reflux disease)   . Arthritis   . Fracture   . CVA (cerebral vascular accident)     STROKE 7 YRS AGO - DAY AFTER BRAIN SURGERY TO REMOVE BENIGN MENINGIOMA - HAS LEFT SIDED WEAKNESS AND A LITTLE NUMBNESS  . Pain     "EXCRUCIATING PAIN" LEFT HIP - HAS A FRACTURE - IS CONFINED TO W/C AT HOME - NO WEIGHT BEARING LEFT HIP.    Past Surgical History  Procedure Laterality Date  . Brain surgery      tumor removed  . Tonsillectomy    . Partial hysterectomy    . Glaucoma repair    . Cataract extraction    . Kidney surgery      left - HX  ENLARGED LEFT KIDNEY ON XRAY - SURGERY WAS DONE ON URETER   . Colonoscopy    . Coronary angioplasty    . Hip arthroplasty Left 12/29/2012    Procedure: LEFT HIP HEMI ARTHROPLASTY ;  Surgeon: Mauri Pole, MD;  Location: [facility];  Service: Orthopedics;  Laterality: Left;    There were no vitals filed for this visit.  Visit Diagnosis:  Poor posture  Generalized weakness  Abnormality of gait  Impaired functional mobility, balance, gait, and endurance  Muscle weakness (generalized)  Risk for falls  Stiffness of left knee  Hamstring tightness of left lower extremity  Parkinsonism      Subjective Assessment - 09/22/14 1312    Subjective No pain at rest; Pt states she is hurting only in her Lt hip and LE that increases to 8/10 with ambulation.  Husband states Pt was sore on Tuesday but had the best day she's had in a while as far as mobiity, gait and posture.    Currently in Pain? No/denies  [facility] Adult PT Treatment/Exercise - 09/22/14 0001    Ambulation/Gait   Ambulation/Gait Yes   Ambulation/Gait Assistance 4: Min guard   Ambulation Distance (Feet) 470 Feet  no rest break today.   Assistive device None   Gait Pattern Step-through pattern;Decreased step length - right;Decreased step length - left;Decreased stance time - left;Decreased dorsiflexion - left;Decreased dorsiflexion - right;Decreased weight shift to left;Decreased trunk rotation;Trunk flexed;Narrow base of support   Ambulation Surface Level   Gait Comments Performed gait with metronome at 45SPM with focus on improved step length, posture, foot clearance, dynamic balance during gait   Lumbar Exercises: Supine   Other Supine Lumbar Exercises Full body stretch in supine 5 minutes on table with focus on ABD/ER of B UEs, cervical extension, and hip mobility within hip precautions   Other Supine Lumbar Exercises manual hamstring stretch bilaterally, Lt knee extension and  dorsiflexion PROM   Knee/Hip Exercises: Aerobic   Stationary Bike nustep UE/LE 10 minutes level 3 hills #3, 10 minutes.  Seat 6  43SPM average                  PT Short Term Goals - 09/16/14 1812    PT SHORT TERM GOAL #1   Title Patient will demonstrate improved bilateral lower extremity strength to at least 4-/5 in order to improve overall stability and functional mobility skills    Status On-going   PT SHORT TERM GOAL #2   Title Patient will demonstrate improved gait mechanics and will be able to ambulate at least 470ft with rolling walker and cues for improved mechanics and posture no more than 40% of the time, minimal fatigue   Status On-going   PT SHORT TERM GOAL #3   Title Patient will demonstrate the ability to perform supine <-->sit with supervision and cues for sequencing no more than 20% of the time   Status On-going   PT SHORT TERM GOAL #4   Title Patient will be able to perform sit to stand transfer with supervision, cues for proper form no more than 20% of the time and no posterior loss of balance    Status On-going   PT SHORT TERM GOAL #5   Title Patient and her spouse will be independent in correctly and consistently performing appropriate HEP with emphasis on large, high quality movement patterns    Status On-going           PT Long Term Goals - 09/16/14 1812    PT LONG TERM GOAL #1   Title Patient will be able to perform TUG assessment in 15 seconds or less on 3/3 attempts, good safety awareness, and good balance throughout test    PT LONG TERM GOAL #2   Title Patient will be able to ambulate at least 1064ft with LRAD, good posture, improved gait mechanics with equal step lengths, good clearance of floor, improved weight bearing through L leg, and increased stance time bilaterally with no posterior loss of balance and minimal fatigue    PT LONG TERM GOAL #3   Title Patient will be able to perform rolling, supine to sit, and sit to stand transfer with  Mod(I) and rolling walker, good safety awareness, good posture, and high quality movements throughout, minimal fall risk    PT LONG TERM GOAL #4   Title Patient will as a whole demonstrate the ability to perform large, high quality movement patterns on a consistent basis with minimal posterior loss of balance and minimal fall risk in order to  maximize her quality of life and ease of motion   PT LONG TERM GOAL #5   Title Patient and her spouse to be educated regarding BIG movement program/school of thought for people with Parkinsons; also to be educated regarding any available support/activity groups in the local area                Plan - 09/22/14 1707    Clinical Impression Statement Continued with current POC focusing on improving gait sequence/cadence and increasing ROM/posture. Kept metronome at 45 today with gait.   Able to increase Lt knee ROM to neutral with PROM and increase nustep up one difficulty level.  PT with more difficulty synchronizing UE/LE movements today wtih ambulation as was previous session, however still with good posture.  Improved activity tolerance today completeting ambulation 470 feet without rest.  Husband is very pleased with her progress.    PT Home Exercise Plan Continue focus on big motion standing activities challenging coordination and sequencing.  Continue full body stretches initially for session and gait training with metronome for cadence.  Progress difficulty as able.         Problem List Patient Active Problem List   Diagnosis Date Noted  . Abnormality of gait 08/12/2014  . Parkinsonism 08/12/2014  . Stroke 08/12/2014  . Muscle weakness (generalized) 02/22/2014  . Stiffness of left knee 02/22/2014  . Hamstring tightness of left lower extremity 02/22/2014  . Pain in joint, pelvic region and thigh 02/22/2014  . Difficulty walking 04/07/2013  . Osteoporosis 03/10/2013  . Anemia 03/10/2013  . Mild malnutrition 03/10/2013  . S/P left hip  hemiarthroplasty 12/30/2012  . Osteoporosis with pathological fracture with delayed healing 11/25/2012  . Pedal edema 10/27/2012  . Fracture of sacrum 10/02/2012  . Stricture and stenosis of esophagus 09/24/2011  . Hyperlipidemia 11/03/2008  . CAD, UNSPECIFIED SITE 11/03/2008    Teena Irani, PTA/CLT [phone] 09/22/2014, 5:14 PM  [facility] [address] [place], [place], [address] Phone: [phone]   Fax:  [phone]

## 2014-09-24 ENCOUNTER — Other Ambulatory Visit: Payer: Self-pay | Admitting: Family Medicine

## 2014-09-24 ENCOUNTER — Ambulatory Visit (HOSPITAL_COMMUNITY): Payer: Medicare Other | Admitting: Physical Therapy

## 2014-09-24 DIAGNOSIS — G20 Parkinson's disease: Secondary | ICD-10-CM

## 2014-09-24 DIAGNOSIS — Z9181 History of falling: Secondary | ICD-10-CM

## 2014-09-24 DIAGNOSIS — R269 Unspecified abnormalities of gait and mobility: Secondary | ICD-10-CM

## 2014-09-24 DIAGNOSIS — Z7409 Other reduced mobility: Secondary | ICD-10-CM

## 2014-09-24 DIAGNOSIS — R531 Weakness: Secondary | ICD-10-CM

## 2014-09-24 DIAGNOSIS — R293 Abnormal posture: Secondary | ICD-10-CM

## 2014-09-24 NOTE — Therapy (Signed)
[facility] [address] [place], [place], [address] Phone: [phone]   Fax:  [phone]  Physical Therapy Treatment  Patient Details  Name: Samantha Hudson MRN: [id number] Date of Birth: 12/17/1934 Referring Provider:  Marcial Pacas, MD  Encounter Date: 09/24/2014      PT End of Session - 09/24/14 1618    Visit Number 8   Number of Visits 18   Date for PT Re-Evaluation 09/29/14   Authorization Type Medicare   Authorization Time Period 09/01/14 to 11/01/14   Authorization - Visit Number 8   Authorization - Number of Visits 10   PT Start Time 1300   PT Stop Time 1350   PT Time Calculation (min) 50 min   Equipment Utilized During Treatment Gait belt   Activity Tolerance Patient tolerated treatment well   Behavior During Therapy [facility] for tasks assessed/performed      Past Medical History  Diagnosis Date  . Hyperlipemia   . IBS (irritable bowel syndrome)   . Osteoporosis   . Fibromyalgia   . Glaucoma     HAD LASER SURGERY - NO EYE DROPS REQUIRED  . Globus hystericus   . Fibromyalgia   . Meningioma   . CAD (coronary artery disease)     HEART STENT PLACED ABOUT 2000- NO LONGER SEES CARDIOLOGIST; NO C/O OF CHEST PAINS OR SOB  . Heart murmur     MVP SINCE THE 60'S - TAKES ATENOLOL -  . Anxiety   . Swelling     BILATERAL FEET AND LEGS - ONSET OF SWELLING APRIL 2014  . GERD (gastroesophageal reflux disease)   . Arthritis   . Fracture   . CVA (cerebral vascular accident)     STROKE 7 YRS AGO - DAY AFTER BRAIN SURGERY TO REMOVE BENIGN MENINGIOMA - HAS LEFT SIDED WEAKNESS AND A LITTLE NUMBNESS  . Pain     "EXCRUCIATING PAIN" LEFT HIP - HAS A FRACTURE - IS CONFINED TO W/C AT HOME - NO WEIGHT BEARING LEFT HIP.    Past Surgical History  Procedure Laterality Date  . Brain surgery      tumor removed  . Tonsillectomy    . Partial hysterectomy    . Glaucoma repair    . Cataract extraction    . Kidney surgery      left - HX  ENLARGED LEFT KIDNEY ON XRAY - SURGERY WAS DONE ON URETER   . Colonoscopy    . Coronary angioplasty    . Hip arthroplasty Left 12/29/2012    Procedure: LEFT HIP HEMI ARTHROPLASTY ;  Surgeon: Mauri Pole, MD;  Location: [facility];  Service: Orthopedics;  Laterality: Left;    There were no vitals filed for this visit.  Visit Diagnosis:  Parkinsonism  Poor posture  Generalized weakness  Abnormality of gait  Impaired functional mobility, balance, gait, and endurance  Risk for falls      Subjective Assessment - 09/24/14 1610    Subjective Patient very pleasant and states that she has been doing MUCH better recently, she believes due to combination of skilled PT services and medication adjustments by MD. She continues to have pain in her hip and low back when walking, states that her MD had ordered an MRI of the area to be done next week to further investigate. Also states that she is doing more around the house than she has been able to do in the past couple of years.    Pertinent History Diagnosed  with Parkinsons around 10 days ago; also started taking Sinemet about 10 days ago. Patient had some trouble getting over posterior hip surgery she had in 2014, symptoms pointed MD to diagnose new onset Parkinsons.    Currently in Pain? No/denies                         [facility] Adult PT Treatment/Exercise - 09/24/14 0001    Ambulation/Gait   Ambulation/Gait Yes   Ambulation/Gait Assistance 4: Min guard   Ambulation Distance (Feet) 225 Feet  no rest break, metronome at 45BPM    Assistive device None   Gait Pattern Step-through pattern;Decreased step length - right;Decreased step length - left;Decreased stance time - left;Decreased dorsiflexion - left;Decreased dorsiflexion - right;Decreased weight shift to left;Decreased trunk rotation;Trunk flexed;Narrow base of support   Ambulation Surface Level   Gait Comments Performed gait with metronome at 45SPM with focus on improved  step length, reciprocal motions, posture, foot clearance, dynamic balance during gait   Lumbar Exercises: Supine   Other Supine Lumbar Exercises Full body stretch in supine 5 minutes on table with focus on ABD/ER of B UEs, cervical extension, and hip mobility within hip precautions   Other Supine Lumbar Exercises 2# weights placed on legs to assist with LE stretch, manual stretching of bilateral ankle plantar flexors in supine    Knee/Hip Exercises: Aerobic   Stationary Bike Nustep seat 9, level 3 at hills setting 3 for 5 minutes  at average of 43SPM    Knee/Hip Exercises: Standing   Other Standing Knee Exercises Reciprocal UE/LE flexion with metronome for improved coordination, posture, and reciprocal motion   Other Standing Knee Exercises Standing trunk rotation and weight shifting with 40BPM metronome    Knee/Hip Exercises: Seated   Other Seated Knee Exercises Lateral flexion to further edges of balance/base of support, therapist facilitation for full motion    Other Seated Knee Exercises Trunk rotation at EOB with therapist facilitation for proper weight shifting and proper movement parameters throughout                 PT Education - 09/24/14 1618    Education provided Yes   Education Details education for possible metronome use at home since patient continues to state that sometimes at home she just feels "stuck" and that metronome in clinic does help her avoid freezing    Person(s) Educated Patient   Methods Explanation   Comprehension Verbalized understanding          PT Short Term Goals - 09/16/14 1812    PT SHORT TERM GOAL #1   Title Patient will demonstrate improved bilateral lower extremity strength to at least 4-/5 in order to improve overall stability and functional mobility skills    Status On-going   PT SHORT TERM GOAL #2   Title Patient will demonstrate improved gait mechanics and will be able to ambulate at least 431ft with rolling walker and cues for improved  mechanics and posture no more than 40% of the time, minimal fatigue   Status On-going   PT SHORT TERM GOAL #3   Title Patient will demonstrate the ability to perform supine <-->sit with supervision and cues for sequencing no more than 20% of the time   Status On-going   PT SHORT TERM GOAL #4   Title Patient will be able to perform sit to stand transfer with supervision, cues for proper form no more than 20% of the time and no posterior loss  of balance    Status On-going   PT SHORT TERM GOAL #5   Title Patient and her spouse will be independent in correctly and consistently performing appropriate HEP with emphasis on large, high quality movement patterns    Status On-going           PT Long Term Goals - 09/16/14 1812    PT LONG TERM GOAL #1   Title Patient will be able to perform TUG assessment in 15 seconds or less on 3/3 attempts, good safety awareness, and good balance throughout test    PT LONG TERM GOAL #2   Title Patient will be able to ambulate at least 1028ft with LRAD, good posture, improved gait mechanics with equal step lengths, good clearance of floor, improved weight bearing through L leg, and increased stance time bilaterally with no posterior loss of balance and minimal fatigue    PT LONG TERM GOAL #3   Title Patient will be able to perform rolling, supine to sit, and sit to stand transfer with Mod(I) and rolling walker, good safety awareness, good posture, and high quality movements throughout, minimal fall risk    PT LONG TERM GOAL #4   Title Patient will as a whole demonstrate the ability to perform large, high quality movement patterns on a consistent basis with minimal posterior loss of balance and minimal fall risk in order to maximize her quality of life and ease of motion   PT LONG TERM GOAL #5   Title Patient and her spouse to be educated regarding BIG movement program/school of thought for people with Parkinsons; also to be educated regarding any available  support/activity groups in the local area                Plan - 09/24/14 1619    Clinical Impression Statement Continued with full body stretch with focus on postural muscles, functional tasks involving dynamic balance/trunk rotation/coordination/rhythmic motion, gait training, and reciprocal motion on Nustep. Patient tolerated session well and did respond well to neuro-facilitation by therapist for improved motion and technique during postural and trunk exercises in sitting and standing. Appeared to have difficulty in synchronizing UE/LE movements during gait today; did require occasional cues to reduce freezing episodes today. Posture remains improved. States that her MD has ordered an MRI to investigte the pain she is having in her hip and low back during gait. Patient and her family are very pleased with her progress overall.    Pt will benefit from skilled therapeutic intervention in order to improve on the following deficits Abnormal gait;Decreased coordination;Difficulty walking;Impaired flexibility;Improper body mechanics;Postural dysfunction;Decreased safety awareness;Decreased endurance;Decreased activity tolerance;Decreased balance;Decreased mobility;Decreased strength;Other (comment)   Rehab Potential Good   PT Frequency 3x / week   PT Duration 6 weeks   PT Treatment/Interventions ADLs/Self Care Home Management;Gait training;Neuromuscular re-education;Visual/perceptual remediation/compensation;Stair training;Biofeedback;Functional mobility training;Patient/family education;Passive range of motion;Therapeutic activities;Therapeutic exercise;Manual techniques;Energy conservation;DME Instruction;Balance training   PT Next Visit Plan Continue functional full body stretch techniques; focus on use of metronome at 40SPM-45 (no more than 45SPM) during standing reciprocal tasks; trunk rotation during gait; continue Nustep to assist in promoting reciprocal motion; gait training    PT Home  Exercise Plan Continue focus on big motion standing activities challenging coordination and sequencing.  Continue full body stretches initially for session and gait training with metronome for cadence.  Progress difficulty as able.    Consulted and Agree with Plan of Care Patient        Problem List Patient Active Problem  List   Diagnosis Date Noted  . Abnormality of gait 08/12/2014  . Parkinsonism 08/12/2014  . Stroke 08/12/2014  . Muscle weakness (generalized) 02/22/2014  . Stiffness of left knee 02/22/2014  . Hamstring tightness of left lower extremity 02/22/2014  . Pain in joint, pelvic region and thigh 02/22/2014  . Difficulty walking 04/07/2013  . Osteoporosis 03/10/2013  . Anemia 03/10/2013  . Mild malnutrition 03/10/2013  . S/P left hip hemiarthroplasty 12/30/2012  . Osteoporosis with pathological fracture with delayed healing 11/25/2012  . Pedal edema 10/27/2012  . Fracture of sacrum 10/02/2012  . Stricture and stenosis of esophagus 09/24/2011  . Hyperlipidemia 11/03/2008  . CAD, UNSPECIFIED SITE 11/03/2008    Deniece Ree PT, DPT [phone]  [facility] [address] [place], [place], [address] Phone: [phone]   Fax:  [phone]

## 2014-09-27 ENCOUNTER — Ambulatory Visit (HOSPITAL_COMMUNITY): Payer: Medicare Other | Admitting: Physical Therapy

## 2014-09-27 DIAGNOSIS — G20 Parkinson's disease: Secondary | ICD-10-CM | POA: Diagnosis not present

## 2014-09-27 DIAGNOSIS — R531 Weakness: Secondary | ICD-10-CM

## 2014-09-27 DIAGNOSIS — R293 Abnormal posture: Secondary | ICD-10-CM

## 2014-09-27 DIAGNOSIS — Z9181 History of falling: Secondary | ICD-10-CM

## 2014-09-27 DIAGNOSIS — R269 Unspecified abnormalities of gait and mobility: Secondary | ICD-10-CM

## 2014-09-27 DIAGNOSIS — M6281 Muscle weakness (generalized): Secondary | ICD-10-CM

## 2014-09-27 DIAGNOSIS — Z7409 Other reduced mobility: Secondary | ICD-10-CM

## 2014-09-27 NOTE — Therapy (Signed)
[facility] [address] [place], [place], [address] Phone: [phone]   Fax:  [phone]  Physical Therapy Treatment  Patient Details  Name: Samantha Hudson MRN: [id number] Date of Birth: March 19, 1935 Referring Provider:  Marcial Pacas, MD  Encounter Date: 09/27/2014      PT End of Session - 09/27/14 1409    Visit Number 9   Number of Visits 18   Date for PT Re-Evaluation 09/29/14   Authorization Type Medicare   Authorization Time Period 09/01/14 to 11/01/14   Authorization - Visit Number 9   Authorization - Number of Visits 10   PT Start Time 1300   PT Stop Time 1324   PT Time Calculation (min) 53 min   Equipment Utilized During Treatment Gait belt   Activity Tolerance Patient tolerated treatment well   Behavior During Therapy [facility] for tasks assessed/performed      Past Medical History  Diagnosis Date  . Hyperlipemia   . IBS (irritable bowel syndrome)   . Osteoporosis   . Fibromyalgia   . Glaucoma     HAD LASER SURGERY - NO EYE DROPS REQUIRED  . Globus hystericus   . Fibromyalgia   . Meningioma   . CAD (coronary artery disease)     HEART STENT PLACED ABOUT 2000- NO LONGER SEES CARDIOLOGIST; NO C/O OF CHEST PAINS OR SOB  . Heart murmur     MVP SINCE THE 60'S - TAKES ATENOLOL -  . Anxiety   . Swelling     BILATERAL FEET AND LEGS - ONSET OF SWELLING APRIL 2014  . GERD (gastroesophageal reflux disease)   . Arthritis   . Fracture   . CVA (cerebral vascular accident)     STROKE 7 YRS AGO - DAY AFTER BRAIN SURGERY TO REMOVE BENIGN MENINGIOMA - HAS LEFT SIDED WEAKNESS AND A LITTLE NUMBNESS  . Pain     "EXCRUCIATING PAIN" LEFT HIP - HAS A FRACTURE - IS CONFINED TO W/C AT HOME - NO WEIGHT BEARING LEFT HIP.    Past Surgical History  Procedure Laterality Date  . Brain surgery      tumor removed  . Tonsillectomy    . Partial hysterectomy    . Glaucoma repair    . Cataract extraction    . Kidney surgery      left - HX  ENLARGED LEFT KIDNEY ON XRAY - SURGERY WAS DONE ON URETER   . Colonoscopy    . Coronary angioplasty    . Hip arthroplasty Left 12/29/2012    Procedure: LEFT HIP HEMI ARTHROPLASTY ;  Surgeon: Mauri Pole, MD;  Location: [facility];  Service: Orthopedics;  Laterality: Left;    There were no vitals filed for this visit.  Visit Diagnosis:  Parkinsonism  Poor posture  Generalized weakness  Abnormality of gait  Impaired functional mobility, balance, gait, and endurance  Risk for falls  Muscle weakness (generalized)      Subjective Assessment - 09/27/14 1400    Subjective Patient reports that she is doing much better pain wise today, also reports that she slept well last night. Continues to express interest in a personal metronome, as she does feel like she tends to get stuck with some activities at home.  Also reports that she will not be able to make the rest of her sessions this week as she will be out of town (reported at end of session).    Pertinent History Diagnosed with Parkinsons around 10 days ago;  also started taking Sinemet about 10 days ago. Patient had some trouble getting over posterior hip surgery she had in 2014, symptoms pointed MD to diagnose new onset Parkinsons.    Currently in Pain? No/denies                         [facility] Adult PT Treatment/Exercise - 09/27/14 0001    Ambulation/Gait   Ambulation/Gait Yes   Ambulation/Gait Assistance 4: Min guard   Assistive device None   Gait Pattern Step-through pattern;Decreased step length - right;Decreased step length - left;Decreased stance time - left;Decreased dorsiflexion - left;Decreased dorsiflexion - right;Decreased weight shift to left;Decreased trunk rotation;Trunk flexed;Narrow base of support   Ambulation Surface Level   Gait Comments Performed functional gait initially with music and then with verbal cues from PT for proper stepping strategy and PT facilitation of improved trunk rotation    Lumbar  Exercises: Supine   Other Supine Lumbar Exercises Full body stretch in supine 5 minutes on table with focus on ABD/ER of B UEs, cervical extension, and hip mobility within hip precautions   Other Supine Lumbar Exercises 2# weights placed on legs to assist with LE stretch, manual stretching of bilateral ankle plantar flexors in supine    Knee/Hip Exercises: Aerobic   Stationary Bike Nustep seat 9, level 3 at hills setting 3 for 10 minutes  at average of 43SPM    Knee/Hip Exercises: Standing   Other Standing Knee Exercises Sit to stand training initially attempted with rhymatic music, then switched to verbal cues    Knee/Hip Exercises: Seated   Other Seated Knee Exercises PT facilitation of posterior shoulder rolls in order to improve posture and open up upper trunk for respiration    Other Seated Knee Exercises PT facilitation of posterior/lateral reach in combination with trunk rotation for improved posture and respiratory mechanics                  PT Education - 09/27/14 1408    Education provided Yes   Education Details patient education regarding exaggerated movements, focusing on posture at home; education on use of metronome at home to help her avoid freezing episodes    Person(s) Educated Patient;Spouse   Methods Explanation;Handout   Comprehension Verbalized understanding          PT Short Term Goals - 09/16/14 1812    PT SHORT TERM GOAL #1   Title Patient will demonstrate improved bilateral lower extremity strength to at least 4-/5 in order to improve overall stability and functional mobility skills    Status On-going   PT SHORT TERM GOAL #2   Title Patient will demonstrate improved gait mechanics and will be able to ambulate at least 451ft with rolling walker and cues for improved mechanics and posture no more than 40% of the time, minimal fatigue   Status On-going   PT SHORT TERM GOAL #3   Title Patient will demonstrate the ability to perform supine <-->sit with  supervision and cues for sequencing no more than 20% of the time   Status On-going   PT SHORT TERM GOAL #4   Title Patient will be able to perform sit to stand transfer with supervision, cues for proper form no more than 20% of the time and no posterior loss of balance    Status On-going   PT SHORT TERM GOAL #5   Title Patient and her spouse will be independent in correctly and consistently performing appropriate HEP with  emphasis on large, high quality movement patterns    Status On-going           PT Long Term Goals - 09/16/14 1812    PT LONG TERM GOAL #1   Title Patient will be able to perform TUG assessment in 15 seconds or less on 3/3 attempts, good safety awareness, and good balance throughout test    PT LONG TERM GOAL #2   Title Patient will be able to ambulate at least 1039ft with LRAD, good posture, improved gait mechanics with equal step lengths, good clearance of floor, improved weight bearing through L leg, and increased stance time bilaterally with no posterior loss of balance and minimal fatigue    PT LONG TERM GOAL #3   Title Patient will be able to perform rolling, supine to sit, and sit to stand transfer with Mod(I) and rolling walker, good safety awareness, good posture, and high quality movements throughout, minimal fall risk    PT LONG TERM GOAL #4   Title Patient will as a whole demonstrate the ability to perform large, high quality movement patterns on a consistent basis with minimal posterior loss of balance and minimal fall risk in order to maximize her quality of life and ease of motion   PT LONG TERM GOAL #5   Title Patient and her spouse to be educated regarding BIG movement program/school of thought for people with Parkinsons; also to be educated regarding any available support/activity groups in the local area                Plan - 09/27/14 1410    Clinical Impression Statement Continued with full body stretch with focus on postural muscles. Trialed  new form of sensory cuing with music of appropriate BPM during all functional tasks today, however patient did not tolerate musical sensory cues well today and appeared to consistently have more freezing episodes with music cues during all functional tasks. Although PT initially trialed all functional tasks today with music, modified activities to simple verbal cues as patient did have difficulty with musical cues. Overall recommend staying with metronome for cuing strategies during treatment sessions, as patient does respond very well to metronome. Patient remains open to using metronome at home to assist in reducing freezing episodes- handouts given; however patient's spouse does not appear as open to the idea of metronome, stating "...she doesn't need that!..." and will likely require further education regarding functional purpose of metronome. Patient tolerated session well today however appeared to experience more freezing episodes throughout treatment today.    Pt will benefit from skilled therapeutic intervention in order to improve on the following deficits Abnormal gait;Decreased coordination;Difficulty walking;Impaired flexibility;Improper body mechanics;Postural dysfunction;Decreased safety awareness;Decreased endurance;Decreased activity tolerance;Decreased balance;Decreased mobility;Decreased strength;Other (comment)   Rehab Potential Good   PT Frequency 3x / week   PT Duration 6 weeks   PT Treatment/Interventions ADLs/Self Care Home Management;Gait training;Neuromuscular re-education;Visual/perceptual remediation/compensation;Stair training;Biofeedback;Functional mobility training;Patient/family education;Passive range of motion;Therapeutic activities;Therapeutic exercise;Manual techniques;Energy conservation;DME Instruction;Balance training   PT Next Visit Plan G-codes next session. Continue functional full body stretch techniques; focus on use of metronome at 40SPM-45 (no more than 45SPM)  during standing reciprocal tasks; trunk rotation during gait; continue Nustep to assist in promoting reciprocal motion; gait training    PT Home Exercise Plan Continue focus on big motion standing activities challenging coordination and sequencing.  Continue full body stretches initially for session and gait training with metronome for cadence.  Progress difficulty as able.    Consulted and  Agree with Plan of Care Patient        Problem List Patient Active Problem List   Diagnosis Date Noted  . Abnormality of gait 08/12/2014  . Parkinsonism 08/12/2014  . Stroke 08/12/2014  . Muscle weakness (generalized) 02/22/2014  . Stiffness of left knee 02/22/2014  . Hamstring tightness of left lower extremity 02/22/2014  . Pain in joint, pelvic region and thigh 02/22/2014  . Difficulty walking 04/07/2013  . Osteoporosis 03/10/2013  . Anemia 03/10/2013  . Mild malnutrition 03/10/2013  . S/P left hip hemiarthroplasty 12/30/2012  . Osteoporosis with pathological fracture with delayed healing 11/25/2012  . Pedal edema 10/27/2012  . Fracture of sacrum 10/02/2012  . Stricture and stenosis of esophagus 09/24/2011  . Hyperlipidemia 11/03/2008  . CAD, UNSPECIFIED SITE 11/03/2008   Deniece Ree PT, DPT [phone]  [facility] [address] [place], [place], [address] Phone: [phone]   Fax:  [phone]

## 2014-09-29 ENCOUNTER — Ambulatory Visit (HOSPITAL_COMMUNITY): Payer: Medicare Other | Admitting: Physical Therapy

## 2014-10-01 ENCOUNTER — Ambulatory Visit (HOSPITAL_COMMUNITY): Payer: Medicare Other

## 2014-10-04 ENCOUNTER — Ambulatory Visit (HOSPITAL_COMMUNITY): Payer: Medicare Other | Admitting: Physical Therapy

## 2014-10-05 ENCOUNTER — Telehealth: Payer: Self-pay | Admitting: Neurology

## 2014-10-05 ENCOUNTER — Ambulatory Visit (HOSPITAL_COMMUNITY)
Admission: RE | Admit: 2014-10-05 | Discharge: 2014-10-05 | Disposition: A | Discharge status: Home / Self Care | Payer: Medicare Other | Source: Ambulatory Visit | Attending: Neurology | Admitting: Neurology

## 2014-10-05 DIAGNOSIS — G8929 Other chronic pain: Secondary | ICD-10-CM | POA: Insufficient documentation

## 2014-10-05 DIAGNOSIS — M545 Low back pain: Secondary | ICD-10-CM | POA: Diagnosis present

## 2014-10-05 DIAGNOSIS — I639 Cerebral infarction, unspecified: Secondary | ICD-10-CM

## 2014-10-05 DIAGNOSIS — G20 Parkinson's disease: Secondary | ICD-10-CM

## 2014-10-05 DIAGNOSIS — R269 Unspecified abnormalities of gait and mobility: Secondary | ICD-10-CM

## 2014-10-05 NOTE — Telephone Encounter (Signed)
Spoke to [place] and her husband - both aware of results.

## 2014-10-05 NOTE — Telephone Encounter (Signed)
Samantha Hudson, please call patient, MRI lumbar showed chronic degenerative changes,  I will go over findings at her next follow-up in November 11 2014, no change in treatment plan this point   There is lumbosacral transitional anatomy. This report assumes that there are 5 lumbar type vertebral bodies. Recommend close correlation with radiographs if intervention is elected. 2. Moderate multilevel lumbar spondylosis. Degenerative disease is most pronounced at L3-L4 potentially affecting both exiting L3 nerves. 3. Healed sacral insufficiency fractures demonstrated on prior nuclear medicine bone scan.

## 2014-10-06 ENCOUNTER — Ambulatory Visit (HOSPITAL_COMMUNITY): Payer: Medicare Other | Admitting: Physical Therapy

## 2014-10-06 DIAGNOSIS — G20 Parkinson's disease: Secondary | ICD-10-CM | POA: Diagnosis not present

## 2014-10-06 DIAGNOSIS — M25552 Pain in left hip: Secondary | ICD-10-CM

## 2014-10-06 DIAGNOSIS — R531 Weakness: Secondary | ICD-10-CM

## 2014-10-06 DIAGNOSIS — R293 Abnormal posture: Secondary | ICD-10-CM

## 2014-10-06 DIAGNOSIS — Z9181 History of falling: Secondary | ICD-10-CM

## 2014-10-06 DIAGNOSIS — M6281 Muscle weakness (generalized): Secondary | ICD-10-CM

## 2014-10-06 DIAGNOSIS — G20C Parkinsonism, unspecified: Secondary | ICD-10-CM

## 2014-10-06 DIAGNOSIS — R269 Unspecified abnormalities of gait and mobility: Secondary | ICD-10-CM

## 2014-10-06 DIAGNOSIS — M25662 Stiffness of left knee, not elsewhere classified: Secondary | ICD-10-CM

## 2014-10-06 DIAGNOSIS — Z7409 Other reduced mobility: Secondary | ICD-10-CM

## 2014-10-06 DIAGNOSIS — M6289 Other specified disorders of muscle: Secondary | ICD-10-CM

## 2014-10-06 NOTE — Therapy (Signed)
[facility] [address] [place], [place], [address] Phone: [phone]   Fax:  [phone]  Physical Therapy Treatment (G-code update)  Patient Details  Name: Samantha Hudson MRN: [id number] Date of Birth: 1934/05/28 Referring Provider:  Marcial Pacas, MD  Encounter Date: 10/06/2014      PT End of Session - 10/06/14 1412    Visit Number 10   Number of Visits 18   Date for PT Re-Evaluation 10/01/14   Authorization Type Medicare   Authorization Time Period 09/01/14 to 11/01/14   Authorization - Visit Number 10   Authorization - Number of Visits 20   PT Start Time 1300   PT Stop Time 1400   PT Time Calculation (min) 60 min   Equipment Utilized During Treatment Gait belt   Activity Tolerance Patient tolerated treatment well   Behavior During Therapy [facility] for tasks assessed/performed      Past Medical History  Diagnosis Date  . Hyperlipemia   . IBS (irritable bowel syndrome)   . Osteoporosis   . Fibromyalgia   . Glaucoma     HAD LASER SURGERY - NO EYE DROPS REQUIRED  . Globus hystericus   . Fibromyalgia   . Meningioma   . CAD (coronary artery disease)     HEART STENT PLACED ABOUT 2000- NO LONGER SEES CARDIOLOGIST; NO C/O OF CHEST PAINS OR SOB  . Heart murmur     MVP SINCE THE 60'S - TAKES ATENOLOL -  . Anxiety   . Swelling     BILATERAL FEET AND LEGS - ONSET OF SWELLING APRIL 2014  . GERD (gastroesophageal reflux disease)   . Arthritis   . Fracture   . CVA (cerebral vascular accident)     STROKE 7 YRS AGO - DAY AFTER BRAIN SURGERY TO REMOVE BENIGN MENINGIOMA - HAS LEFT SIDED WEAKNESS AND A LITTLE NUMBNESS  . Pain     "EXCRUCIATING PAIN" LEFT HIP - HAS A FRACTURE - IS CONFINED TO W/C AT HOME - NO WEIGHT BEARING LEFT HIP.    Past Surgical History  Procedure Laterality Date  . Brain surgery      tumor removed  . Tonsillectomy    . Partial hysterectomy    . Glaucoma repair    . Cataract extraction    . Kidney surgery       left - HX ENLARGED LEFT KIDNEY ON XRAY - SURGERY WAS DONE ON URETER   . Colonoscopy    . Coronary angioplasty    . Hip arthroplasty Left 12/29/2012    Procedure: LEFT HIP HEMI ARTHROPLASTY ;  Surgeon: Mauri Pole, MD;  Location: [facility];  Service: Orthopedics;  Laterality: Left;    There were no vitals filed for this visit.  Visit Diagnosis:  Parkinsonism  Poor posture  Generalized weakness  Abnormality of gait  Impaired functional mobility, balance, gait, and endurance  Risk for falls  Muscle weakness (generalized)  Stiffness of left knee  Hamstring tightness of left lower extremity  Pain in joint, pelvic region and thigh, left      Subjective Assessment - 10/06/14 1404    Subjective Pt states she is doing well and reports she vacationed near [place] over the weekend.  Pt states she plans on ordering a metronome this week.  Husband states she is still unable to reach her feet to complete donning/doffing her shoes and would like her to be able to do this.     Currently in Pain? No/denies  [facility] PT Treatment/Exercise - 10/06/14 1335    Ambulation/Gait   Ambulation/Gait Yes   Ambulation/Gait Assistance 4: Min guard   Ambulation Distance (Feet) 275 Feet  with metronome 45   Assistive device None   Gait Pattern Step-through pattern;Decreased step length - right;Decreased step length - left;Decreased stance time - left;Decreased dorsiflexion - left;Decreased dorsiflexion - right;Decreased weight shift to left;Decreased trunk rotation;Trunk flexed;Narrow base of support   Ambulation Surface Level   Lumbar Exercises: Supine   Other Supine Lumbar Exercises Full body stretch in supine 5 minutes on table with focus on ABD/ER of B UEs, cervical extension, and hip mobility within hip precautions   Other Supine Lumbar Exercises 2# weights placed on legs to assist with LE stretch, manual stretching of bilateral ankle plantar  flexors in supine    Knee/Hip Exercises: Standing   Other Standing Knee Exercises Sit to stand training 10 reps 57 cm                  PT Short Term Goals - 10/06/14 1408    PT SHORT TERM GOAL #1   Title Patient will demonstrate improved bilateral lower extremity strength to at least 4-/5 in order to improve overall stability and functional mobility skills    Time 3   Period Weeks   Status On-going   PT SHORT TERM GOAL #2   Title Patient will demonstrate improved gait mechanics and will be able to ambulate at least 464f with rolling walker and cues for improved mechanics and posture no more than 40% of the time, minimal fatigue   Time 3   Period Weeks   Status Achieved   PT SHORT TERM GOAL #3   Title Patient will demonstrate the ability to perform supine <-->sit with supervision and cues for sequencing no more than 20% of the time   Baseline patient can complete this from elevated surface but not from lower surface   Time 3   Period Weeks   Status Partially Met   PT SHORT TERM GOAL #4   Title Patient will be able to perform sit to stand transfer with supervision, cues for proper form no more than 20% of the time and no posterior loss of balance    Baseline requires cues approx 40%   Time 3   Period Weeks   Status On-going   PT SHORT TERM GOAL #5   Title Patient and her spouse will be independent in correctly and consistently performing appropriate HEP with emphasis on large, high quality movement patterns    Time 3   Period Weeks   Status Achieved           PT Long Term Goals - 10/06/14 1411    PT LONG TERM GOAL #1   Title Patient will be able to perform TUG assessment in 15 seconds or less on 3/3 attempts, good safety awareness, and good balance throughout test    Time 6   Period Weeks   Status Unable to assess  did not assess today   PT LONG TERM GOAL #2   Title Patient will be able to ambulate at least 10045fwith LRAD, good posture, improved gait  mechanics with equal step lengths, good clearance of floor, improved weight bearing through L leg, and increased stance time bilaterally with no posterior loss of balance and minimal fatigue    Time 6   Period Weeks   Status On-going   PT LONG TERM GOAL #3   Title Patient will  be able to perform rolling, supine to sit, and sit to stand transfer with Mod(I) and rolling walker, good safety awareness, good posture, and high quality movements throughout, minimal fall risk    Time 6   Period Weeks   Status Partially Met   PT LONG TERM GOAL #4   Title Patient will as a whole demonstrate the ability to perform large, high quality movement patterns on a consistent basis with minimal posterior loss of balance and minimal fall risk in order to maximize her quality of life and ease of motion   Time 4   Period Weeks   Status On-going   PT LONG TERM GOAL #5   Title Patient and her spouse to be educated regarding BIG movement program/school of thought for people with Parkinsons; also to be educated regarding any available support/activity groups in the local area    Time 6   Period Weeks   Status On-going               Plan - 10-13-14 1415    Clinical Impression Statement continued with current plan of care including full body stretch, gait using metronome and increasing reciprocal gross body movements.  Pt with 3 freezing episodes during gait and with noted improvment in reciprocal UE swing.  Worked on sit to stand with patient having difficulty getting her head over her toes to push forward into standing position.  Pt is progressing toward goals.    Pt will benefit from skilled therapeutic intervention in order to improve on the following deficits Abnormal gait;Decreased coordination;Difficulty walking;Impaired flexibility;Improper body mechanics;Postural dysfunction;Decreased safety awareness;Decreased endurance;Decreased activity tolerance;Decreased balance;Decreased mobility;Decreased  strength;Other (comment)   Rehab Potential Good   PT Frequency 3x / week   PT Duration 6 weeks   PT Treatment/Interventions ADLs/Self Care Home Management;Gait training;Neuromuscular re-education;Visual/perceptual remediation/compensation;Stair training;Biofeedback;Functional mobility training;Patient/family education;Passive range of motion;Therapeutic activities;Therapeutic exercise;Manual techniques;Energy conservation;DME Instruction;Balance training   PT Next Visit Plan Continue functional full body stretch techniques; focus on use of metronome at 40SPM-45 (no more than 45SPM) during standing reciprocal tasks; trunk rotation during gait; continue Nustep to assist in promoting reciprocal motion; gait training .  Work on functional task of putting on socks/shoes next visti per husband request.    Consulted and Agree with Plan of Care Patient       Problem List Patient Active Problem List   Diagnosis Date Noted  . Abnormality of gait 08/12/2014  . Parkinsonism 08/12/2014  . Stroke 08/12/2014  . Muscle weakness (generalized) 02/22/2014  . Stiffness of left knee 02/22/2014  . Hamstring tightness of left lower extremity 02/22/2014  . Pain in joint, pelvic region and thigh 02/22/2014  . Difficulty walking 04/07/2013  . Osteoporosis 03/10/2013  . Anemia 03/10/2013  . Mild malnutrition 03/10/2013  . S/P left hip hemiarthroplasty 12/30/2012  . Osteoporosis with pathological fracture with delayed healing 11/25/2012  . Pedal edema 10/27/2012  . Fracture of sacrum 10/02/2012  . Stricture and stenosis of esophagus 09/24/2011  . Hyperlipidemia 11/03/2008  . CAD, UNSPECIFIED SITE 11/03/2008    Teena Irani, PTA/CLT [phone] 10/13/2014, 4:51 PM       G-Codes - 2014-10-13 1649    Functional Assessment Tool Used Skilled clinical observation and assessment based on functional mobility, posture, gait impairments, strength, movement quality, and functional activity tolerance    Functional  Limitation Mobility: Walking and moving around   Mobility: Walking and Moving Around Current Status ([id number]) At least 40 percent but less than 60 percent impaired, limited or  restricted   Mobility: Walking and Moving Around Goal Status [phone]) At least 20 percent but less than 40 percent impaired, limited or restricted      Deniece Ree PT, DPT [place], [place], [address] Phone: [phone]   Fax:  [phone]

## 2014-10-08 ENCOUNTER — Ambulatory Visit (HOSPITAL_COMMUNITY): Payer: Medicare Other | Admitting: Physical Therapy

## 2014-10-08 DIAGNOSIS — Z7409 Other reduced mobility: Secondary | ICD-10-CM

## 2014-10-08 DIAGNOSIS — Z9181 History of falling: Secondary | ICD-10-CM

## 2014-10-08 DIAGNOSIS — R293 Abnormal posture: Secondary | ICD-10-CM

## 2014-10-08 DIAGNOSIS — R531 Weakness: Secondary | ICD-10-CM

## 2014-10-08 DIAGNOSIS — R269 Unspecified abnormalities of gait and mobility: Secondary | ICD-10-CM

## 2014-10-08 DIAGNOSIS — G20 Parkinson's disease: Secondary | ICD-10-CM

## 2014-10-08 NOTE — Therapy (Signed)
[facility] [address] [place], [place], [address] Phone: [phone]   Fax:  [phone]  Physical Therapy Treatment (Re-Assessment)  Patient Details  Name: Samantha Hudson MRN: [id number] Date of Birth: 29-Jun-1934 Referring Provider:  Marcial Pacas, MD  Encounter Date: 10/08/2014      PT End of Session - 10/08/14 1352    Visit Number 11   Number of Visits 18   Date for PT Re-Evaluation 10/29/14   Authorization Type Medicare   Authorization Time Period 09/01/14 to 11/01/14   Authorization - Visit Number 11   Authorization - Number of Visits 20   PT Start Time 6599   PT Stop Time 1350   PT Time Calculation (min) 45 min   Equipment Utilized During Treatment Gait belt   Activity Tolerance Patient tolerated treatment well   Behavior During Therapy [facility] for tasks assessed/performed      Past Medical History  Diagnosis Date  . Hyperlipemia   . IBS (irritable bowel syndrome)   . Osteoporosis   . Fibromyalgia   . Glaucoma     HAD LASER SURGERY - NO EYE DROPS REQUIRED  . Globus hystericus   . Fibromyalgia   . Meningioma   . CAD (coronary artery disease)     HEART STENT PLACED ABOUT 2000- NO LONGER SEES CARDIOLOGIST; NO C/O OF CHEST PAINS OR SOB  . Heart murmur     MVP SINCE THE 60'S - TAKES ATENOLOL -  . Anxiety   . Swelling     BILATERAL FEET AND LEGS - ONSET OF SWELLING APRIL 2014  . GERD (gastroesophageal reflux disease)   . Arthritis   . Fracture   . CVA (cerebral vascular accident)     STROKE 7 YRS AGO - DAY AFTER BRAIN SURGERY TO REMOVE BENIGN MENINGIOMA - HAS LEFT SIDED WEAKNESS AND A LITTLE NUMBNESS  . Pain     "EXCRUCIATING PAIN" LEFT HIP - HAS A FRACTURE - IS CONFINED TO W/C AT HOME - NO WEIGHT BEARING LEFT HIP.    Past Surgical History  Procedure Laterality Date  . Brain surgery      tumor removed  . Tonsillectomy    . Partial hysterectomy    . Glaucoma repair    . Cataract extraction    . Kidney surgery       left - HX ENLARGED LEFT KIDNEY ON XRAY - SURGERY WAS DONE ON URETER   . Colonoscopy    . Coronary angioplasty    . Hip arthroplasty Left 12/29/2012    Procedure: LEFT HIP HEMI ARTHROPLASTY ;  Surgeon: Mauri Pole, MD;  Location: [facility];  Service: Orthopedics;  Laterality: Left;    There were no vitals filed for this visit.  Visit Diagnosis:  Parkinsonism  Poor posture  Generalized weakness  Abnormality of gait  Impaired functional mobility, balance, gait, and endurance  Risk for falls      Subjective Assessment - 10/08/14 1307    Subjective Patient reports everything is going well, statse she had a great time in [place] the other weekend    Pertinent History Diagnosed with Parkinsons around 10 days ago; also started taking Sinemet about 10 days ago. Patient had some trouble getting over posterior hip surgery she had in 2014, symptoms pointed MD to diagnose new onset Parkinsons.    Currently in Pain? Yes   Pain Location Shoulder   Pain Orientation Left            [facility]  PT Assessment - 10/08/14 0001    Assessment   Medical Diagnosis Parkinsons    Onset Date --  over the past year and a half or so    Next MD Visit Mid-June    Strength   Right Hip ABduction 4-/5  assessed sitting edge of table    Left Hip ABduction 4-/5  assessed sitting edge of table    Right Knee Flexion 4-/5   Right Knee Extension 4-/5   Left Knee Flexion 4-/5   Left Knee Extension 4/5   Right Ankle Dorsiflexion 4/5   Left Ankle Dorsiflexion 4/5   Bed Mobility   Rolling Right 5: Supervision   Rolling Left 5: Supervision   Supine to Sit 5: Supervision   Sitting - Scoot to Edge of Bed 5: Supervision   Sit to Supine 5: Supervision   Transfers   Sit to Stand 4: Min guard   Stand to Sit 4: Min guard   Ambulation/Gait   Ambulation/Gait Yes   Ambulation/Gait Assistance 4: Min guard   Ambulation Distance (Feet) 480 Feet   Assistive device None   Gait Pattern Step-through  pattern;Decreased step length - right;Decreased step length - left;Decreased stance time - left;Decreased dorsiflexion - left;Decreased dorsiflexion - right;Decreased weight shift to left;Decreased trunk rotation;Trunk flexed;Narrow base of support   Ambulation Surface Level   Gait Comments 6 minute walk 412f without rollator    High Level Balance   High Level Balance Comments TUG 25.04, 24.78, 25.30                             PT Education - 10/08/14 1351    Education provided Yes   Education Details plan of care moving forward, need for new referral to directily treat back    Person(s) Educated Patient   Methods Explanation   Comprehension Verbalized understanding          PT Short Term Goals - 10/08/14 1356    PT SHORT TERM GOAL #1   Title Patient will demonstrate improved bilateral lower extremity strength to at least 4-/5 in order to improve overall stability and functional mobility skills    Time 3   Period Weeks   Status Achieved   PT SHORT TERM GOAL #2   Title Patient will demonstrate improved gait mechanics and will be able to ambulate at least 4066fwith rolling walker and cues for improved mechanics and posture no more than 40% of the time, minimal fatigue   Baseline 5/20- 48066fithout walker but fatigued    Time 3   Period Weeks   Status Achieved   PT SHORT TERM GOAL #3   Title Patient will demonstrate the ability to perform supine <-->sit with supervision and cues for sequencing no more than 20% of the time   Baseline patient can complete this from elevated surface but not from lower surface   Time 3   Period Weeks   Status Achieved   PT SHORT TERM GOAL #4   Title Patient will be able to perform sit to stand transfer with supervision, cues for proper form no more than 20% of the time and no posterior loss of balance    Baseline 5/20- Min guard    Time 3   Period Weeks   Status On-going   PT SHORT TERM GOAL #5   Title Patient and her  spouse will be independent in correctly and consistently performing appropriate HEP with emphasis on  large, high quality movement patterns    Time 3   Period Weeks   Status Achieved           PT Long Term Goals - 10/08/14 1357    PT LONG TERM GOAL #1   Title Patient will be able to perform TUG assessment in 15 seconds or less on 3/3 attempts, good safety awareness, and good balance throughout test    Baseline 5/20- TUG 24-25 seconds    Time 6   Period Weeks   Status On-going   PT LONG TERM GOAL #2   Title Patient will be able to ambulate at least 1035f with no assistive device, good posture, improved gait mechanics with equal step lengths, good clearance of floor, improved weight bearing through L leg, and increased stance time bilaterally with no posterior loss of balance and minimal fatigue    Time 6   Period Weeks   Status Revised   PT LONG TERM GOAL #3   Title Patient will be able to perform rolling, supine to sit, and sit to stand transfer with Mod(I) and good safety awareness, good posture, and high quality movements throughout, minimal fall risk    Time 6   Period Weeks   Status Revised   PT LONG TERM GOAL #4   Title Patient will as a whole demonstrate the ability to perform large, high quality movement patterns on a consistent basis with minimal posterior loss of balance and minimal fall risk in order to maximize her quality of life and ease of motion   Time 4   Period Weeks   Status On-going   PT LONG TERM GOAL #5   Title Patient and her spouse to be educated regarding BIG movement program/school of thought for people with Parkinsons; also to be educated regarding any available support/activity groups in the local area    Baseline 5/20- educated regarding parkinsons support group coming up later in the year    Time 6   Period Weeks   Status Partially Met               Plan - 10/08/14 1352    Clinical Impression Statement Re-assessment performed today.  Patient shows significant improvements in strength, bed mobility, functional activity tolerance, gait mechanics, posture, and movement quality. However patient does continue to experience freezing episodes and continues to state that she will benefti from metronome at home. Patient also continues to demonstrate difficulty with sit to stand transfer and does continue to require cues for quality and range of movement during all functional tasks and gait; also, although her functional activity tolerance is increased she does continue to show deficits in this area as evidenced by fatigue after gait of 4851ftoday. Patient will benefit from skilled PT services for approximately 6 more weeks at 2-3 times per week in order to address these deficits and assist her in reaching an optimal level of function with high quality of life at home and in the community.    Pt will benefit from skilled therapeutic intervention in order to improve on the following deficits Abnormal gait;Decreased coordination;Difficulty walking;Impaired flexibility;Improper body mechanics;Postural dysfunction;Decreased safety awareness;Decreased endurance;Decreased activity tolerance;Decreased balance;Decreased mobility;Decreased strength;Other (comment)   Rehab Potential Good   PT Frequency 3x / week   PT Duration 6 weeks   PT Treatment/Interventions ADLs/Self Care Home Management;Gait training;Neuromuscular re-education;Visual/perceptual remediation/compensation;Stair training;Biofeedback;Functional mobility training;Patient/family education;Passive range of motion;Therapeutic activities;Therapeutic exercise;Manual techniques;Energy conservation;DME Instruction;Balance training   PT Next Visit Plan initiate cone tapping tasks. Continue functional  full body stretch techniques; focus on use of metronome at 40SPM-45 (no more than 45SPM) during standing reciprocal tasks; trunk rotation during gait; continue Nustep to assist in promoting reciprocal  motion; gait training .  Work on functional task of putting on socks/shoes next visti per husband request.    PT Home Exercise Plan Continue focus on big motion standing activities challenging coordination and sequencing.  Continue full body stretches initially for session and gait training with metronome for cadence.  Progress difficulty as able.    Consulted and Agree with Plan of Care Patient        Problem List Patient Active Problem List   Diagnosis Date Noted  . Abnormality of gait 08/12/2014  . Parkinsonism 08/12/2014  . Stroke 08/12/2014  . Muscle weakness (generalized) 02/22/2014  . Stiffness of left knee 02/22/2014  . Hamstring tightness of left lower extremity 02/22/2014  . Pain in joint, pelvic region and thigh 02/22/2014  . Difficulty walking 04/07/2013  . Osteoporosis 03/10/2013  . Anemia 03/10/2013  . Mild malnutrition 03/10/2013  . S/P left hip hemiarthroplasty 12/30/2012  . Osteoporosis with pathological fracture with delayed healing 11/25/2012  . Pedal edema 10/27/2012  . Fracture of sacrum 10/02/2012  . Stricture and stenosis of esophagus 09/24/2011  . Hyperlipidemia 11/03/2008  . CAD, UNSPECIFIED SITE 11/03/2008    Deniece Ree PT, DPT [phone]  [facility] [address] [place], [place], [address] Phone: [phone]   Fax:  [phone]

## 2014-10-11 ENCOUNTER — Ambulatory Visit (HOSPITAL_COMMUNITY): Payer: Medicare Other | Admitting: Physical Therapy

## 2014-10-11 ENCOUNTER — Telehealth: Payer: Self-pay | Admitting: Neurology

## 2014-10-11 DIAGNOSIS — Z7409 Other reduced mobility: Secondary | ICD-10-CM

## 2014-10-11 DIAGNOSIS — R293 Abnormal posture: Secondary | ICD-10-CM

## 2014-10-11 DIAGNOSIS — Z9181 History of falling: Secondary | ICD-10-CM

## 2014-10-11 DIAGNOSIS — R269 Unspecified abnormalities of gait and mobility: Secondary | ICD-10-CM

## 2014-10-11 DIAGNOSIS — G20 Parkinson's disease: Secondary | ICD-10-CM | POA: Diagnosis not present

## 2014-10-11 DIAGNOSIS — M5440 Lumbago with sciatica, unspecified side: Secondary | ICD-10-CM

## 2014-10-11 DIAGNOSIS — R531 Weakness: Secondary | ICD-10-CM

## 2014-10-11 NOTE — Telephone Encounter (Signed)
Left message for a return call

## 2014-10-11 NOTE — Telephone Encounter (Signed)
Pt called stating she returned Michelle's call. Please call and advise.  Pt can be reached at [phone].

## 2014-10-11 NOTE — Therapy (Signed)
[place], [place], [address] Phone: [phone]   Fax:  [phone]  Physical Therapy Treatment  Patient Details  Name: Samantha Hudson MRN: [id number] Date of Birth: Jan 25, 1935 Referring Provider:  Marcial Pacas, MD  Encounter Date: 10/11/2014      PT End of Session - 10/11/14 1350    Visit Number 12   Number of Visits 18   Date for PT Re-Evaluation 10/29/14   Authorization Type Medicare   Authorization Time Period 09/01/14 to 11/01/14   Authorization - Visit Number 12   Authorization - Number of Visits 20   PT Start Time 6294   PT Stop Time 1350   PT Time Calculation (min) 48 min   Equipment Utilized During Treatment Gait belt   Activity Tolerance Patient tolerated treatment well  Mild fatigue with treatment, but able to complete all activities.    Behavior During Therapy [facility] for tasks assessed/performed      Past Medical History  Diagnosis Date  . Hyperlipemia   . IBS (irritable bowel syndrome)   . Osteoporosis   . Fibromyalgia   . Glaucoma     HAD LASER SURGERY - NO EYE DROPS REQUIRED  . Globus hystericus   . Fibromyalgia   . Meningioma   . CAD (coronary artery disease)     HEART STENT PLACED ABOUT 2000- NO LONGER SEES CARDIOLOGIST; NO C/O OF CHEST PAINS OR SOB  . Heart murmur     MVP SINCE THE 60'S - TAKES ATENOLOL -  . Anxiety   . Swelling     BILATERAL FEET AND LEGS - ONSET OF SWELLING APRIL 2014  . GERD (gastroesophageal reflux disease)   . Arthritis   . Fracture   . CVA (cerebral vascular accident)     STROKE 7 YRS AGO - DAY AFTER BRAIN SURGERY TO REMOVE BENIGN MENINGIOMA - HAS LEFT SIDED WEAKNESS AND A LITTLE NUMBNESS  . Pain     "EXCRUCIATING PAIN" LEFT HIP - HAS A FRACTURE - IS CONFINED TO W/C AT HOME - NO WEIGHT BEARING LEFT HIP.    Past Surgical History  Procedure Laterality Date  . Brain surgery      tumor removed  . Tonsillectomy    . Partial hysterectomy    . Glaucoma  repair    . Cataract extraction    . Kidney surgery      left - HX ENLARGED LEFT KIDNEY ON XRAY - SURGERY WAS DONE ON URETER   . Colonoscopy    . Coronary angioplasty    . Hip arthroplasty Left 12/29/2012    Procedure: LEFT HIP HEMI ARTHROPLASTY ;  Surgeon: Mauri Pole, MD;  Location: [facility];  Service: Orthopedics;  Laterality: Left;    There were no vitals filed for this visit.  Visit Diagnosis:  Parkinsonism  Poor posture  Generalized weakness  Abnormality of gait  Impaired functional mobility, balance, gait, and endurance  Risk for falls      Subjective Assessment - 10/11/14 1307    Subjective Patient and husband report overal improvement in functional mobility, able to improve number of community outtings.    Patient is accompained by: Family member   Pertinent History Diagnosed with Parkinsons around 10 days ago; also started taking Sinemet recently. Patient had some trouble getting over posterior hip surgery she had in 2014, symptoms pointed MD to diagnose new onset Parkinsons.    Currently in Pain? Yes  Pt reports hx of chronic pain,  but no additional exacerbation at this time. Pain in shoulder is still present but much better over the weekend.    Pain Location Shoulder   Pain Orientation Left   Pain Relieving Factors Pt has required less Rx pain meds over weekend.                          [place] [facility] PT Treatment/Exercise - 10/11/14 0001    Transfers   Sit to Stand 4: Min assist;From chair/3-in-1;Without upper extremity assist  Audio biofeedback metronome at 30bpm, cues for forward lean.   Stand to Sit 4: Min assist;To chair/3-in-1;Without upper extremity assist  Audio biofeedback metronome at 30bpm, cues for forward lean.   Ambulation/Gait   Ambulation/Gait Yes   Ambulation/Gait Assistance 4: Min guard   Ambulation Distance (Feet) 450 Feet   Assistive device None   Gait Pattern Step-through pattern;Decreased step length - right;Decreased  dorsiflexion - left;Decreased dorsiflexion - right;Decreased weight shift to left;Decreased trunk rotation;Trunk flexed;Narrow base of support   Ambulation Surface Level   Gait Comments walk 2x23f without rollator, MinGuardA    Metronome for audio biofeedback at 40 bpm   Knee/Hip Exercises: Aerobic   Stationary Bike Nustep seat 9, level 4 at hills setting 3 for 8 minutes  at average of 43SPM    Knee/Hip Exercises: Standing   Other Standing Knee Exercises Standing SLS+hip Abduction, alternating bilat 1x10  Metronome at 40bpm             Balance Exercises - 10/11/14 1333    Balance Exercises: Standing   Retro Gait Other (comment);2 reps  2x20 feet MinGuardA             PT Short Term Goals - 10/08/14 1356    PT SHORT TERM GOAL #1   Title Patient will demonstrate improved bilateral lower extremity strength to at least 4-/5 in order to improve overall stability and functional mobility skills    Time 3   Period Weeks   Status Achieved   PT SHORT TERM GOAL #2   Title Patient will demonstrate improved gait mechanics and will be able to ambulate at least 4044fwith rolling walker and cues for improved mechanics and posture no more than 40% of the time, minimal fatigue   Baseline 5/20- 48034fithout walker but fatigued    Time 3   Period Weeks   Status Achieved   PT SHORT TERM GOAL #3   Title Patient will demonstrate the ability to perform supine <-->sit with supervision and cues for sequencing no more than 20% of the time   Baseline patient can complete this from elevated surface but not from lower surface   Time 3   Period Weeks   Status Achieved   PT SHORT TERM GOAL #4   Title Patient will be able to perform sit to stand transfer with supervision, cues for proper form no more than 20% of the time and no posterior loss of balance    Baseline 5/20- Min guard    Time 3   Period Weeks   Status On-going   PT SHORT TERM GOAL #5   Title Patient and her spouse will be  independent in correctly and consistently performing appropriate HEP with emphasis on large, high quality movement patterns    Time 3   Period Weeks   Status Achieved           PT Long Term Goals - 10/08/14 1357    PT LONG  TERM GOAL #1   Title Patient will be able to perform TUG assessment in 15 seconds or less on 3/3 attempts, good safety awareness, and good balance throughout test    Baseline 5/20- TUG 24-25 seconds    Time 6   Period Weeks   Status On-going   PT LONG TERM GOAL #2   Title Patient will be able to ambulate at least 1041f with no assistive device, good posture, improved gait mechanics with equal step lengths, good clearance of floor, improved weight bearing through L leg, and increased stance time bilaterally with no posterior loss of balance and minimal fatigue    Time 6   Period Weeks   Status Revised   PT LONG TERM GOAL #3   Title Patient will be able to perform rolling, supine to sit, and sit to stand transfer with Mod(I) and good safety awareness, good posture, and high quality movements throughout, minimal fall risk    Time 6   Period Weeks   Status Revised   PT LONG TERM GOAL #4   Title Patient will as a whole demonstrate the ability to perform large, high quality movement patterns on a consistent basis with minimal posterior loss of balance and minimal fall risk in order to maximize her quality of life and ease of motion   Time 4   Period Weeks   Status On-going   PT LONG TERM GOAL #5   Title Patient and her spouse to be educated regarding BIG movement program/school of thought for people with Parkinsons; also to be educated regarding any available support/activity groups in the local area    Baseline 5/20- educated regarding parkinsons support group coming up later in the year    Time 6   Period Weeks   Status Partially Met               Plan - 10/11/14 1400    Clinical Impression Statement Pt continues to make progress toward goals as  evidenced by . Pt should continue to work on improving activity tolerance, improve gait symmetry, decreasing gait cadence, and reducing freezing episodes. Pt  continues to present with impairment of functional mobility, strength, balance, pain, and posture, limiting functional mobility and social activities, and will continue to benefit from skilled intervention to improve functional independence in ADL, IADL, and ambulation, and  to decrease caregiver burden.    Pt will benefit from skilled therapeutic intervention in order to improve on the following deficits Abnormal gait;Decreased coordination;Difficulty walking;Impaired flexibility;Improper body mechanics;Postural dysfunction;Decreased safety awareness;Decreased endurance;Decreased activity tolerance;Decreased balance;Decreased mobility;Decreased strength;Other (comment)   Rehab Potential Good   PT Frequency 3x / week   PT Duration 6 weeks   PT Treatment/Interventions ADLs/Self Care Home Management;Gait training;Neuromuscular re-education;Visual/perceptual remediation/compensation;Stair training;Biofeedback;Functional mobility training;Patient/family education;Passive range of motion;Therapeutic activities;Therapeutic exercise;Manual techniques;Energy conservation;DME Instruction;Balance training   PT Next Visit Plan initiate cone tapping tasks. Continue functional full body stretch techniques, with use of metronome at 40SPM-45 (no more than 45SPM) during standing reciprocal tasks; trunk rotation during gait; continue progressing Nustep to assist in promoting reciprocal motion; gait training .  Work on functional task of putting on socks/shoes next visti per husband request.    PT Home Exercise Plan Continue focus on big motion standing activities challenging coordination and sequencing.  Continue full body stretches initially for session and gait training with metronome for cadence.  Progress difficulty as able.    Consulted and Agree with Plan of Care  Patient  Problem List Patient Active Problem List   Diagnosis Date Noted  . Abnormality of gait 08/12/2014  . Parkinsonism 08/12/2014  . Stroke 08/12/2014  . Muscle weakness (generalized) 02/22/2014  . Stiffness of left knee 02/22/2014  . Hamstring tightness of left lower extremity 02/22/2014  . Pain in joint, pelvic region and thigh 02/22/2014  . Difficulty walking 04/07/2013  . Osteoporosis 03/10/2013  . Anemia 03/10/2013  . Mild malnutrition 03/10/2013  . S/P left hip hemiarthroplasty 12/30/2012  . Osteoporosis with pathological fracture with delayed healing 11/25/2012  . Pedal edema 10/27/2012  . Fracture of sacrum 10/02/2012  . Stricture and stenosis of esophagus 09/24/2011  . Hyperlipidemia 11/03/2008  . CAD, UNSPECIFIED SITE 11/03/2008   Deniece Ree PT, DPT [phone]  [place] [address] [place], [place], [address] Phone: [phone]   Fax:  [phone]

## 2014-10-11 NOTE — Telephone Encounter (Signed)
-----   Message from Hunt Oris, PT sent at 10/08/2014  4:13 PM EDT ----- Regarding: Possible back pain referral?  Hello-  I am the primary PT that has been working with Ms. Rosana Hoes here at [facility].  She has been progressing very well with [place], however recently has been complaining of back pain during our sessions and does show a limp at times possibly due to her back pain. She also mentioned that she was going to receive imaging for her back earlier this month during a prior session.  Her back pain appears to be playing a role in possibly limiting her mobility, especially gait distance and mechanics.  If you feel it would be appropriate, I would be happy to include interventions for her back pain in her treatment sessions. However, in order for insurance to reimburse for such interventions we would require a new referral specifically for her back pain.  Please let me know your thoughts and I welcome any guidance you have to offer for this patient's course of care.  Thank you,  Deniece Ree PT, DPT

## 2014-10-11 NOTE — Telephone Encounter (Signed)
Called back - left another message for a return call.

## 2014-10-11 NOTE — Telephone Encounter (Addendum)
I have put in  new referral for low back pain. MRI lumbar showed multilevel lumbar spondylosis, most pronounced at lumbar 3, 4 level, potentially affecting bilateral L3 nerves,    IMPRESSION: 1. 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 This report assumes that there are 5 lumbar type vertebral bodies. Recommend close correlation with radiographs if intervention is elected. 2. Moderate multilevel lumbar spondylosis. Degenerative disease is most pronounced at L3-L4 potentially affecting both exiting L3 nerves. 3. Healed sacral insufficiency fractures demonstrated on prior nuclear medicine bone scan.

## 2014-10-11 NOTE — Telephone Encounter (Signed)
Spoke to patient - she is aware of MRI results.  She would like her PT to be at [facility] for her back pain.  She is already in PT there now to help with her gait.  Thank you.

## 2014-10-13 ENCOUNTER — Ambulatory Visit (HOSPITAL_COMMUNITY): Payer: Medicare Other | Admitting: Physical Therapy

## 2014-10-13 DIAGNOSIS — R269 Unspecified abnormalities of gait and mobility: Secondary | ICD-10-CM

## 2014-10-13 DIAGNOSIS — R293 Abnormal posture: Secondary | ICD-10-CM

## 2014-10-13 DIAGNOSIS — R531 Weakness: Secondary | ICD-10-CM

## 2014-10-13 DIAGNOSIS — Z9181 History of falling: Secondary | ICD-10-CM

## 2014-10-13 DIAGNOSIS — M6281 Muscle weakness (generalized): Secondary | ICD-10-CM

## 2014-10-13 DIAGNOSIS — Z7409 Other reduced mobility: Secondary | ICD-10-CM

## 2014-10-13 DIAGNOSIS — G20 Parkinson's disease: Secondary | ICD-10-CM

## 2014-10-13 DIAGNOSIS — M545 Low back pain: Secondary | ICD-10-CM

## 2014-10-13 DIAGNOSIS — R2689 Other abnormalities of gait and mobility: Secondary | ICD-10-CM

## 2014-10-13 NOTE — Patient Instructions (Signed)
   Thoracic side-bending:  While seated in chair with either arms in air or crossed over chest, side--bend to right, and then reverse and bend to opposite side: Avoid rotation or flexion or extension if possible.  2x 15, once daily.      Seated thoracic rotation:  while seated at edge of chair with arms crossed, rotated to the right, pause, reverse and then change direction rotating to Left. Repeat 15 times.  2x 15, once daily.       Thoracic extension and flexion:  While seated in chair and arms crossed, bring elbows down to lap, and then reverse direction and extend upper middle back with elbows in air.  2x 15, once daily.      Backward Shoulder Rolls:  Sit with both arm sresting on a table (or bed). Start with arm circles, focusing on moving the shoulder while keeping the arm resting on the table. Perform circling backwards utilizing back extension throughout.  2x 15x backwards, once daily.     Forward weight shifting of pelvis:  Standing tall with hands on hips and feet shoulder width apart, gently rock pelvis forward and backward while maintaining tall stance and balance.  15x each direction., once daily.

## 2014-10-13 NOTE — Therapy (Signed)
[place], [place], [address] Phone: [phone]   Fax:  [phone]  Physical Therapy Evaluation  Patient Details  Name: Samantha Hudson MRN: [id number] Date of Birth: 1935-01-12 Referring Provider:  Kathyrn Drown, MD  Encounter Date: 10/13/2014      PT End of Session - 10/13/14 1542    Visit Number 13   Number of Visits 18   Date for PT Re-Evaluation 11/05/14   Authorization Type Medicare   Authorization Time Period 09/01/14 to 11/01/14   Authorization - Visit Number 13   Authorization - Number of Visits 20   Equipment Utilized During Treatment Gait belt   Activity Tolerance Patient tolerated treatment well   Behavior During Therapy [facility] for tasks assessed/performed      Past Medical History  Diagnosis Date  . Hyperlipemia   . IBS (irritable bowel syndrome)   . Osteoporosis   . Fibromyalgia   . Glaucoma     HAD LASER SURGERY - NO EYE DROPS REQUIRED  . Globus hystericus   . Fibromyalgia   . Meningioma   . CAD (coronary artery disease)     HEART STENT PLACED ABOUT 2000- NO LONGER SEES CARDIOLOGIST; NO C/O OF CHEST PAINS OR SOB  . Heart murmur     MVP SINCE THE 60'S - TAKES ATENOLOL -  . Anxiety   . Swelling     BILATERAL FEET AND LEGS - ONSET OF SWELLING APRIL 2014  . GERD (gastroesophageal reflux disease)   . Arthritis   . Fracture   . CVA (cerebral vascular accident)     STROKE 7 YRS AGO - DAY AFTER BRAIN SURGERY TO REMOVE BENIGN MENINGIOMA - HAS LEFT SIDED WEAKNESS AND A LITTLE NUMBNESS  . Pain     "EXCRUCIATING PAIN" LEFT HIP - HAS A FRACTURE - IS CONFINED TO W/C AT HOME - NO WEIGHT BEARING LEFT HIP.    Past Surgical History  Procedure Laterality Date  . Brain surgery      tumor removed  . Tonsillectomy    . Partial hysterectomy    . Glaucoma repair    . Cataract extraction    . Kidney surgery      left - HX ENLARGED LEFT KIDNEY ON XRAY - SURGERY WAS DONE ON URETER   . Colonoscopy     . Coronary angioplasty    . Hip arthroplasty Left 12/29/2012    Procedure: LEFT HIP HEMI ARTHROPLASTY ;  Surgeon: Mauri Pole, MD;  Location: [facility];  Service: Orthopedics;  Laterality: Left;    There were no vitals filed for this visit.  Visit Diagnosis:  Parkinsonism - Plan: PT plan of care cert/re-cert  Low back pain without sciatica, unspecified back pain laterality - Plan: PT plan of care cert/re-cert  Decreased spinal mobility - Plan: PT plan of care cert/re-cert  Poor posture - Plan: PT plan of care cert/re-cert  Generalized weakness - Plan: PT plan of care cert/re-cert  Abnormality of gait - Plan: PT plan of care cert/re-cert  Impaired functional mobility, balance, gait, and endurance - Plan: PT plan of care cert/re-cert  Risk for falls - Plan: PT plan of care cert/re-cert  Muscle weakness (generalized) - Plan: PT plan of care cert/re-cert      Subjective Assessment - 10/13/14 1510    Subjective Patient and husband report overal improvement in functional mobility, able to improve number of community outtings. 5/25- on an average day her low back feels tight  especially when she is trying to stand up straight; patient and spouse seem to think that it is tied to L hip as well    Pertinent History Diagnosed with Parkinsons around 10 days ago; also started taking Sinemet recently. Patient had some trouble getting over posterior hip surgery she had in 2014, symptoms pointed MD to diagnose new onset Parkinsons. 5/25- patient has recently started to experience new onset low back pain, referral received from MD   How long can you stand comfortably? 5/25- 30-40 minutes at a time due to back    How long can you walk comfortably? 5/25- doesn't take long, maybe 10 minutes before it starts    Currently in Pain? Yes   Pain Score 6    Pain Location Back   Pain Orientation Lower   Pain Descriptors / Indicators Other (Comment);Jabbing;Sore;Tightness  deep pain             [facility]  PT Assessment - 10/13/14 0001    Assessment   Medical Diagnosis Parkinsons and low back pain    Onset Date/Surgical Date --  over the past year and a half or so    Next MD Visit Mid-June    Precautions   Precautions Posterior Hip   Precaution Comments L LE    Restrictions   Weight Bearing Restrictions No   Balance Screen   Has the patient fallen in the past 6 months No   Has the patient had a decrease in activity level because of a fear of falling?  Yes   Is the patient reluctant to leave their home because of a fear of falling?  Yes   Prior Function   Level of Independence Other (comment);Needs assistance with ADLs;Requires assistive device for independence;Needs assistance with homemaking  was able to transfer and walk with rolling walker   Vocation Retired   Leisure reading, listening  to music    Observation/Other Assessments   Observations Very tight and rigid spine and hips    Posture/Postural Control   Posture Comments forward head, increased thoracic kyphosis, flat lumbar, slight turnk/pelvis rotation to the L    AROM   Lumbar Extension 5   Lumbar - Right Side Bend 18   Lumbar - Left Side Bend 12   Thoracic Flexion 39   Thoracic Extension 10   Thoracic - Right Side Bend 34   Thoracic - Left Side Bend 33   Thoracic - Right Rotation 40   Thoracic - Left Rotation 40   Flexibility   Hamstrings tight bilaterally    Palpation   Spinal mobility rigid lumbar paraspinals bilat   Palpation comment rigidity noted throughout lumbar and thoracic vertebrae    Bed Mobility   Rolling Left 5: Supervision   Supine to Sit 5: Supervision   Sitting - Scoot to Edge of Bed 5: Supervision   Sit to Supine 5: Supervision   Ambulation/Gait   Ambulation/Gait Yes   Ambulation/Gait Assistance 4: Min guard   Gait Comments reduced rotation of trunk and pelvix, reduced arm swing, reduced gait speed                   [facility] Adult PT Treatment/Exercise - 10/13/14 0001    Transfers    Sit to Stand With upper extremity assist;5: Supervision   Stand to Sit With upper extremity assist;5: Supervision                PT Education - 10/13/14 1540    Education provided Yes  Education Details plan of care moving forward, HEP, prognosis for back    Person(s) Educated Patient   Methods Explanation   Comprehension Verbalized understanding          PT Short Term Goals - 10/13/14 1552    PT SHORT TERM GOAL #1   Title Patient will demonstrate improved bilateral lower extremity strength to at least 4-/5 in order to improve overall stability and functional mobility skills    Time 3   Period Weeks   Status Achieved   PT SHORT TERM GOAL #2   Title Patient will demonstrate improved gait mechanics and will be able to ambulate at least 442ft with rolling walker and cues for improved mechanics and posture no more than 40% of the time, minimal fatigue   Baseline 5/20- 472ft without walker but fatigued    Time 3   Period Weeks   Status Achieved   PT SHORT TERM GOAL #3   Title Patient will demonstrate the ability to perform supine <-->sit with supervision and cues for sequencing no more than 20% of the time   Baseline patient can complete this from elevated surface but not from lower surface   Time 3   Period Weeks   Status Achieved   PT SHORT TERM GOAL #4   Title Patient will be able to perform sit to stand transfer with supervision, cues for proper form no more than 20% of the time and no posterior loss of balance    Baseline 5/20- Min guard    Time 3   Period Weeks   Status On-going   PT SHORT TERM GOAL #5   Title Patient and her spouse will be independent in correctly and consistently performing appropriate HEP with emphasis on large, high quality movement patterns    Time 3   Period Weeks   Status Achieved   Additional Short Term Goals   Additional Short Term Goals Yes   PT SHORT TERM GOAL #6   Title Patient will experience no more than 3/10 pain in her low  back and L hip during all weight bearing tasks and activities    Time 3   Period Weeks   Status New   PT SHORT TERM GOAL #7   Title Patient will demonstrate improved spinal mobilty by at least 10 degrees in order to reduce overall stiffness and reduce pain with mobiltiy    Time 3   Period Weeks   Status New   PT SHORT TERM GOAL #8   Title Patient will not experience no more than 3/10 pain in her back and L hip during gait distance of at least 586ft with no assistive device    Time 3   Period Weeks   Status New           PT Long Term Goals - 10/13/14 1555    PT LONG TERM GOAL #1   Title Patient will be able to perform TUG assessment in 15 seconds or less on 3/3 attempts, good safety awareness, and good balance throughout test    Baseline 5/20- TUG 24-25 seconds    Time 6   Period Weeks   Status On-going   PT LONG TERM GOAL #2   Title Patient will be able to ambulate at least 1055ft with no assistive device, good posture, improved gait mechanics with equal step lengths, good clearance of floor, improved weight bearing through L leg, and increased stance time bilaterally with no posterior loss of balance and minimal fatigue  Time 6   Period Weeks   Status Revised   PT LONG TERM GOAL #3   Title Patient will be able to perform rolling, supine to sit, and sit to stand transfer with Mod(I) and good safety awareness, good posture, and high quality movements throughout, minimal fall risk    Time 6   Period Weeks   Status Revised   PT LONG TERM GOAL #4   Title Patient will as a whole demonstrate the ability to perform large, high quality movement patterns on a consistent basis with minimal posterior loss of balance and minimal fall risk in order to maximize her quality of life and ease of motion   Baseline 6   Time 4   Period Weeks   Status On-going   PT LONG TERM GOAL #5   Title Patient and her spouse to be educated regarding BIG movement program/school of thought for people with  Parkinsons; also to be educated regarding any available support/activity groups in the local area    Baseline 5/20- educated regarding parkinsons support group coming up later in the year    Time 6   Period Weeks   Status Partially Met   Additional Long Term Goals   Additional Long Term Goals Yes   PT LONG TERM GOAL #6   Title Patient will experience no more than 1/10 pain in her low back during all functional upright weight bearing tasks    Time 6   Period Weeks   Status New   PT LONG TERM GOAL #7   Title Patient will demonstrate the abilty to ambulate unlimited distances with no assistive device, minimal fatigue, pain no more than 1/10 in her low back    Time 6   Period Weeks   Status New   PT LONG TERM GOAL #8   Title Patient will more easily be able to independently perform functional activities such as putting on her shoes and socks without assistance from her husband    Time 6   Period Weeks   Status New               Plan - 10/13/14 1543    Clinical Impression Statement Re-eval performed today due to MD referral received for low back pain. Patient presents with rigid paraspinals and erector spinae musculature, reduced mobility of vertebrae in lumbar and thoracic spines, reduced gait tolerance due to back pain, reduced lumbar and thoracic moblity, postural and gait impairments, tenderness to palpation in area of L glut max/glut med, and significant levels of low back pain during mobility. Patient has been and continues to receive treatments for her pre-existing Parkinsons disease, and is progressing very well with her plan of care for Parkinsonian symptoms. Moving forward, patient will continue to recieve care for her Parkinsons symptoms as well as treatments for her low back pain with focus on posture, spinal mobility, reducing muscule tightness, and reducing pain and related gait impairments. Patient will benefit from skilled PT services, at approximately 2-3x/week for 6 more  weeks, to address Parkinsonian symptoms and reduce symptoms related to her low back pain.    Pt will benefit from skilled therapeutic intervention in order to improve on the following deficits Abnormal gait;Decreased coordination;Difficulty walking;Impaired flexibility;Improper body mechanics;Postural dysfunction;Decreased safety awareness;Decreased endurance;Decreased activity tolerance;Decreased balance;Decreased mobility;Decreased strength;Other (comment);Hypomobility;Pain;Decreased range of motion   Rehab Potential Good   PT Frequency 3x / week   PT Duration 6 weeks   PT Treatment/Interventions ADLs/Self Care Home Management;Gait training;Neuromuscular re-education;Visual/perceptual remediation/compensation;Stair training;Biofeedback;Functional mobility  training;Patient/family education;Passive range of motion;Therapeutic activities;Therapeutic exercise;Manual techniques;Energy conservation;DME Instruction;Balance training   PT Next Visit Plan Pelvic clocks, 3D hip and thoracic  excursions, funtctional stretches within hip precautions. Manual to L glut med/max PRN. Continue fnctional full body stretch techniques and continue standing reciprocal tasks with metronome. Sit to stand training. Nustep.    PT Home Exercise Plan Continue focus on big motion standing activities challenging coordination and sequencing.  Continue full body stretches initially for session and gait training with metronome for cadence.  Progress difficulty as able.    Consulted and Agree with Plan of Care Patient          G-Codes - 11-01-2014 1559    Functional Assessment Tool Used Skilled clinical observation and assessment based on functional mobility, posture, gait impairments, strength, movement quality, and functional activity tolerance; also based on low back pain, reduced spinal mobility, reduced functional task performance    Functional Limitation Mobility: Walking and moving around;Other PT primary   Mobility: Walking  and Moving Around Current Status [phone]) At least 40 percent but less than 60 percent impaired, limited or restricted   Mobility: Walking and Moving Around Goal Status [phone]) At least 20 percent but less than 40 percent impaired, limited or restricted   Other PT Primary Current Status (L2751) At least 40 percent but less than 60 percent impaired, limited or restricted   Other PT Primary Goal Status (Z0017) At least 20 percent but less than 40 percent impaired, limited or restricted       Problem List Patient Active Problem List   Diagnosis Date Noted  . Abnormality of gait 08/12/2014  . Parkinsonism 08/12/2014  . Stroke 08/12/2014  . Muscle weakness (generalized) 02/22/2014  . Stiffness of left knee 02/22/2014  . Hamstring tightness of left lower extremity 02/22/2014  . Pain in joint, pelvic region and thigh 02/22/2014  . Difficulty walking 04/07/2013  . Osteoporosis 03/10/2013  . Anemia 03/10/2013  . Mild malnutrition 03/10/2013  . S/P left hip hemiarthroplasty 12/30/2012  . Osteoporosis with pathological fracture with delayed healing 11/25/2012  . Pedal edema 10/27/2012  . Fracture of sacrum 10/02/2012  . Stricture and stenosis of esophagus 09/24/2011  . Hyperlipidemia 11/03/2008  . CAD, UNSPECIFIED SITE 11/03/2008    Deniece Ree PT, DPT [phone]  [place] [address] [place], [place], [address] Phone: [phone]   Fax:  [phone]

## 2014-10-13 NOTE — Telephone Encounter (Signed)
Called Samantha Hudson they have order. Patient will start physical therapy in June.

## 2014-10-21 ENCOUNTER — Ambulatory Visit (HOSPITAL_COMMUNITY): Payer: Medicare Other | Attending: Neurology

## 2014-10-21 ENCOUNTER — Encounter (HOSPITAL_COMMUNITY): Payer: Self-pay

## 2014-10-21 DIAGNOSIS — M25662 Stiffness of left knee, not elsewhere classified: Secondary | ICD-10-CM

## 2014-10-21 DIAGNOSIS — R531 Weakness: Secondary | ICD-10-CM

## 2014-10-21 DIAGNOSIS — R269 Unspecified abnormalities of gait and mobility: Secondary | ICD-10-CM | POA: Diagnosis not present

## 2014-10-21 DIAGNOSIS — M25552 Pain in left hip: Secondary | ICD-10-CM

## 2014-10-21 DIAGNOSIS — M6289 Other specified disorders of muscle: Secondary | ICD-10-CM

## 2014-10-21 DIAGNOSIS — Z7409 Other reduced mobility: Secondary | ICD-10-CM | POA: Diagnosis not present

## 2014-10-21 DIAGNOSIS — G20 Parkinson's disease: Secondary | ICD-10-CM | POA: Insufficient documentation

## 2014-10-21 DIAGNOSIS — M6281 Muscle weakness (generalized): Secondary | ICD-10-CM

## 2014-10-21 DIAGNOSIS — R293 Abnormal posture: Secondary | ICD-10-CM | POA: Diagnosis not present

## 2014-10-21 DIAGNOSIS — Z9181 History of falling: Secondary | ICD-10-CM

## 2014-10-21 DIAGNOSIS — M545 Low back pain: Secondary | ICD-10-CM

## 2014-10-21 DIAGNOSIS — R2689 Other abnormalities of gait and mobility: Secondary | ICD-10-CM

## 2014-10-21 NOTE — Therapy (Signed)
[place], [place], [address] Phone: [phone]   Fax:  [phone]  Physical Therapy Treatment  Patient Details  Name: Samantha Hudson MRN: [id number] Date of Birth: 04/09/1935 Referring Provider:  Marcial Pacas, MD  Encounter Date: 10/21/2014      PT End of Session - 10/21/14 1043    Visit Number 14   Number of Visits 30   Date for PT Re-Evaluation 11/05/14   Authorization Type Medicare   Authorization Time Period 09/01/14 to 11/01/14   Authorization - Visit Number 14   Authorization - Number of Visits 30   PT Start Time 0940   PT Stop Time 1020   PT Time Calculation (min) 40 min   Equipment Utilized During Treatment Gait belt   Activity Tolerance Patient tolerated treatment well  Reports mild increase in fatigue after treatment.    Behavior During Therapy [facility] for tasks assessed/performed      Past Medical History  Diagnosis Date  . Hyperlipemia   . IBS (irritable bowel syndrome)   . Osteoporosis   . Fibromyalgia   . Glaucoma     HAD LASER SURGERY - NO EYE DROPS REQUIRED  . Globus hystericus   . Fibromyalgia   . Meningioma   . CAD (coronary artery disease)     HEART STENT PLACED ABOUT 2000- NO LONGER SEES CARDIOLOGIST; NO C/O OF CHEST PAINS OR SOB  . Heart murmur     MVP SINCE THE 60'S - TAKES ATENOLOL -  . Anxiety   . Swelling     BILATERAL FEET AND LEGS - ONSET OF SWELLING APRIL 2014  . GERD (gastroesophageal reflux disease)   . Arthritis   . Fracture   . CVA (cerebral vascular accident)     STROKE 7 YRS AGO - DAY AFTER BRAIN SURGERY TO REMOVE BENIGN MENINGIOMA - HAS LEFT SIDED WEAKNESS AND A LITTLE NUMBNESS  . Pain     "EXCRUCIATING PAIN" LEFT HIP - HAS A FRACTURE - IS CONFINED TO W/C AT HOME - NO WEIGHT BEARING LEFT HIP.    Past Surgical History  Procedure Laterality Date  . Brain surgery      tumor removed  . Tonsillectomy    . Partial hysterectomy    . Glaucoma repair    .  Cataract extraction    . Kidney surgery      left - HX ENLARGED LEFT KIDNEY ON XRAY - SURGERY WAS DONE ON URETER   . Colonoscopy    . Coronary angioplasty    . Hip arthroplasty Left 12/29/2012    Procedure: LEFT HIP HEMI ARTHROPLASTY ;  Surgeon: Mauri Pole, MD;  Location: [facility];  Service: Orthopedics;  Laterality: Left;    There were no vitals filed for this visit.  Visit Diagnosis:  Parkinsonism  Muscle weakness (generalized)  Low back pain without sciatica, unspecified back pain laterality  Stiffness of left knee  Decreased spinal mobility  Hamstring tightness of left lower extremity  Poor posture  Pain in joint, pelvic region and thigh, left  Generalized weakness  Abnormality of gait  Impaired functional mobility, balance, gait, and endurance  Risk for falls      Subjective Assessment - 10/21/14 1025    Subjective Pt reports feeling well over weekend, with trip to [facility]. Pt reports she has been working on ambulation with reciprocal BUE movement at home. Pt reports low back continues to feel sore with prolonged ambulation without rollator.  Patient is accompained by: Family member   Pertinent History Diagnosed with Parkinsons around 30 days ago; also started taking Sinemet recently. Patient had some trouble getting over posterior hip surgery she had in 2014, symptoms pointed MD to diagnose new onset Parkinsons. 5/25- patient has recently started to experience new onset low back pain, referral received from MD and evaluation completed with new updates to program added in today.    Currently in Pain? No/denies                         [facility] Adult PT Treatment/Exercise - 10/21/14 0001    Transfers   Sit to Stand From elevated surface;Without upper extremity assist;4: Min guard  tactile cues for pelvis forward and lean.   Sit to Stand Details (indicate cue type and reason) attempted to perform with metronome @ 40bpm, but focused on technique  instead   2x15   Stand to Sit 5: Supervision;Without upper extremity assist   Stand to Sit Details 2x15   Ambulation/Gait   Ambulation/Gait Yes   Ambulation/Gait Assistance 4: Min guard   Ambulation Distance (Feet) 675 Feet   Assistive device None   Gait Pattern Step-through pattern;Decreased step length - right;Decreased weight shift to left;Decreased trunk rotation;Trunk flexed;Narrow base of support   Ambulation Surface Level   Gait Comments Performed gait with metronome at 40BPM with focus on large steps, heel strike, and reciprocal BUE movement..   Neck Exercises: Supine   Neck Retraction 15 reps  while on towel roll (T5/6) into 2 pillows   Shoulder Flexion 20 reps  Green physioball in BUE overhead   Shoulder Flexion Weights (lbs) green physioball   Shoulder Flexion Limitations Lying on towel roll T5/6  head on two pillows;    Other Supine Exercise Supine Tx Spine Extension stretch  3"; towel roll @ T5/6, hands clasped at neck, feet on 6"step   Other Supine Exercise Standing Rows; Green TB  2x12  at [place], tactile cues for scapular retraction.                   PT Short Term Goals - 10/13/14 1552    PT SHORT TERM GOAL #1   Title Patient will demonstrate improved bilateral lower extremity strength to at least 4-/5 in order to improve overall stability and functional mobility skills    Time 3   Period Weeks   Status Achieved   PT SHORT TERM GOAL #2   Title Patient will demonstrate improved gait mechanics and will be able to ambulate at least 455f with rolling walker and cues for improved mechanics and posture no more than 40% of the time, minimal fatigue   Baseline 5/20- 4819fwithout walker but fatigued    Time 3   Period Weeks   Status Achieved   PT SHORT TERM GOAL #3   Title Patient will demonstrate the ability to perform supine <-->sit with supervision and cues for sequencing no more than 20% of the time   Baseline patient can complete this from elevated  surface but not from lower surface   Time 3   Period Weeks   Status Achieved   PT SHORT TERM GOAL #4   Title Patient will be able to perform sit to stand transfer with supervision, cues for proper form no more than 20% of the time and no posterior loss of balance    Baseline 5/20- Min guard    Time 3   Period Weeks  Status On-going   PT SHORT TERM GOAL #5   Title Patient and her spouse will be independent in correctly and consistently performing appropriate HEP with emphasis on large, high quality movement patterns    Time 3   Period Weeks   Status Achieved   Additional Short Term Goals   Additional Short Term Goals Yes   PT SHORT TERM GOAL #6   Title Patient will experience no more than 3/10 pain in her low back and L hip during all weight bearing tasks and activities    Time 3   Period Weeks   Status New   PT SHORT TERM GOAL #7   Title Patient will demonstrate improved spinal mobilty by at least 10 degrees in order to reduce overall stiffness and reduce pain with mobiltiy    Time 3   Period Weeks   Status New   PT SHORT TERM GOAL #8   Title Patient will not experience no more than 3/10 pain in her back and L hip during gait distance of at least 544f with no assistive device    Time 3   Period Weeks   Status New           PT Long Term Goals - 10/13/14 1555    PT LONG TERM GOAL #1   Title Patient will be able to perform TUG assessment in 15 seconds or less on 3/3 attempts, good safety awareness, and good balance throughout test    Baseline 5/20- TUG 24-25 seconds    Time 6   Period Weeks   Status On-going   PT LONG TERM GOAL #2   Title Patient will be able to ambulate at least 10056fwith no assistive device, good posture, improved gait mechanics with equal step lengths, good clearance of floor, improved weight bearing through L leg, and increased stance time bilaterally with no posterior loss of balance and minimal fatigue    Time 6   Period Weeks   Status Revised    PT LONG TERM GOAL #3   Title Patient will be able to perform rolling, supine to sit, and sit to stand transfer with Mod(I) and good safety awareness, good posture, and high quality movements throughout, minimal fall risk    Time 6   Period Weeks   Status Revised   PT LONG TERM GOAL #4   Title Patient will as a whole demonstrate the ability to perform large, high quality movement patterns on a consistent basis with minimal posterior loss of balance and minimal fall risk in order to maximize her quality of life and ease of motion   Baseline 6   Time 4   Period Weeks   Status On-going   PT LONG TERM GOAL #5   Title Patient and her spouse to be educated regarding BIG movement program/school of thought for people with Parkinsons; also to be educated regarding any available support/activity groups in the local area    Baseline 5/20- educated regarding parkinsons support group coming up later in the year    Time 6   Period Weeks   Status Partially Met   Additional Long Term Goals   Additional Long Term Goals Yes   PT LONG TERM GOAL #6   Title Patient will experience no more than 1/10 pain in her low back during all functional upright weight bearing tasks    Time 6   Period Weeks   Status New   PT LONG TERM GOAL #7   Title Patient will  demonstrate the abilty to ambulate unlimited distances with no assistive device, minimal fatigue, pain no more than 1/10 in her low back    Time 6   Period Weeks   Status New   PT LONG TERM GOAL #8   Title Patient will more easily be able to independently perform functional activities such as putting on her shoes and socks without assistance from her husband    Time 6   Period Weeks   Status New               Plan - 10/21/14 1045    Clinical Impression Statement Re-eval performed recently due to MD referral received for low back pain. Patient presents with rigid paraspinals and erector spinae musculature, reduced mobility of vertebrae in lumbar  and thoracic spines, reduced gait tolerance due to back pain, reduced lumbar and thoracic moblity, postural and gait impairments, tenderness to palpation in area of L glut max/glut med, and significant levels of low back pain during mobility. Patient has been and continues to receive treatments for her pre-existing Parkinsons disease, and is progressing very well with her plan of care for Parkinsonian symptoms. Moving forward, patient will continue to recieve care for her Parkinsons symptoms as well as treatments for her low back pain with focus on posture, spinal mobility, reducing muscle tightness, hyper kyphosis, forward head posturing, pain and related gait impairments. Patient will benefit from skilled PT services, at approximately 2-3x/week for 6 more weeks, to address Parkinsonian symptoms and reduce symptoms related to her low back pain.   Pt will benefit from skilled therapeutic intervention in order to improve on the following deficits Abnormal gait;Decreased coordination;Difficulty walking;Impaired flexibility;Improper body mechanics;Postural dysfunction;Decreased safety awareness;Decreased endurance;Decreased activity tolerance;Decreased balance;Decreased mobility;Decreased strength;Other (comment);Hypomobility;Pain;Decreased range of motion   Rehab Potential Good   PT Frequency 3x / week   PT Duration 6 weeks   PT Treatment/Interventions ADLs/Self Care Home Management;Gait training;Neuromuscular re-education;Visual/perceptual remediation/compensation;Stair training;Biofeedback;Functional mobility training;Patient/family education;Passive range of motion;Therapeutic activities;Therapeutic exercise;Manual techniques;Energy conservation;DME Instruction;Balance training   PT Next Visit Plan Continue supine towel roll stretches for reduction of thoracic kyphosis, tightness in pecs, combined with chin tucks and BUE overhead movements to develop t-spine extension and active ROM. Focus on more L glute  med/max strengthening to improve standing SLS, and R step length during gait cycle. PRN manual to L glut med/max. Continue reciprocal tasks with metronome. Sit to stand training. Nustep.    PT Home Exercise Plan Continue focus on big motion standing activities challenging coordination and sequencing.  Continue full body stretches initially for session and gait training with metronome for cadence.  Progress difficulty as able.    Consulted and Agree with Plan of Care Patient        Problem List Patient Active Problem List   Diagnosis Date Noted  . Abnormality of gait 08/12/2014  . Parkinsonism 08/12/2014  . Stroke 08/12/2014  . Muscle weakness (generalized) 02/22/2014  . Stiffness of left knee 02/22/2014  . Hamstring tightness of left lower extremity 02/22/2014  . Pain in joint, pelvic region and thigh 02/22/2014  . Difficulty walking 04/07/2013  . Osteoporosis 03/10/2013  . Anemia 03/10/2013  . Mild malnutrition 03/10/2013  . S/P left hip hemiarthroplasty 12/30/2012  . Osteoporosis with pathological fracture with delayed healing 11/25/2012  . Pedal edema 10/27/2012  . Fracture of sacrum 10/02/2012  . Stricture and stenosis of esophagus 09/24/2011  . Hyperlipidemia 11/03/2008  . CAD, UNSPECIFIED SITE 11/03/2008    Buccola,Allan C 10/21/2014, 10:54 AM  10:55  AM  Etta Grandchild, PT, DPT [place] License # [id number]        [facility] [address] [place], [place], [address] Phone: [phone]   Fax:  [phone]

## 2014-10-25 ENCOUNTER — Other Ambulatory Visit: Payer: Self-pay | Admitting: Family Medicine

## 2014-10-26 ENCOUNTER — Encounter: Payer: Self-pay | Admitting: Family Medicine

## 2014-10-26 ENCOUNTER — Ambulatory Visit (INDEPENDENT_AMBULATORY_CARE_PROVIDER_SITE_OTHER): Payer: Medicare Other | Admitting: Family Medicine

## 2014-10-26 VITALS — BP 100/60 | Ht 66.0 in | Wt 104.1 lb

## 2014-10-26 DIAGNOSIS — G20 Parkinson's disease: Secondary | ICD-10-CM

## 2014-10-26 DIAGNOSIS — M810 Age-related osteoporosis without current pathological fracture: Secondary | ICD-10-CM | POA: Diagnosis not present

## 2014-10-26 DIAGNOSIS — I878 Other specified disorders of veins: Secondary | ICD-10-CM

## 2014-10-26 DIAGNOSIS — R270 Ataxia, unspecified: Secondary | ICD-10-CM

## 2014-10-26 DIAGNOSIS — I639 Cerebral infarction, unspecified: Secondary | ICD-10-CM

## 2014-10-26 DIAGNOSIS — E785 Hyperlipidemia, unspecified: Secondary | ICD-10-CM | POA: Diagnosis not present

## 2014-10-26 DIAGNOSIS — G20C Parkinsonism, unspecified: Secondary | ICD-10-CM

## 2014-10-26 NOTE — Patient Instructions (Signed)
Gabapentin change to 100 mg, take 2 in the am and 1 in the afternoon, use the 400 mg at bedtime  Follow up here in 3 to 4 months  Sooner if need be

## 2014-10-26 NOTE — Progress Notes (Signed)
   Subjective:    Patient ID: Samantha Hudson, female    DOB: [birth year], 80 y.o.   MRN: [id number]  Hyperlipidemia This is a chronic problem. There are no known factors aggravating her hyperlipidemia. Pertinent negatives include no chest pain. Current antihyperlipidemic treatment includes statins. The current treatment provides moderate improvement of lipids. There are no compliance problems.  There are no known risk factors for coronary artery disease.   Patient states that she is having circulation problems in her fingers and both feet. She would like to discuss this with the doctor.   Patient has no other concerns at this time.   Review of Systems  Constitutional: Negative for activity change, appetite change and fatigue.  HENT: Negative for congestion.   Respiratory: Negative for cough.   Cardiovascular: Negative for chest pain.  Gastrointestinal: Negative for abdominal pain.  Endocrine: Negative for polydipsia and polyphagia.  Neurological: Negative for weakness.  Psychiatric/Behavioral: Negative for confusion.       Objective:   Physical Exam  Constitutional: She appears well-nourished. No distress.  Cardiovascular: Normal rate, regular rhythm and normal heart sounds.   No murmur heard. Pulmonary/Chest: Effort normal and breath sounds normal. No respiratory distress.  Musculoskeletal: She exhibits no edema.  Lymphadenopathy:    She has no cervical adenopathy.  Neurological: She is alert. She exhibits normal muscle tone.  Psychiatric: Her behavior is normal.  Vitals reviewed.         Assessment & Plan:   cachexia-weight gain is better.  Parkinson's under the care of neurology doing well on medication  hyperlipidemia recent lab work looks good under good control with her cholesterol medicine  patient does have osteoporosis she is scheduled to get Reclast  Later this year  ataxia related to Parkinson's she is doing some therapy she is also using her walker this helps     neuropathy not as bad as at has been we will reduce the strength of her gabapentin use 100 mg take 2 in the morning one in the afternoon use of 400 mg in the evening   peripheral cyanosis related to venous stasis no ulcers keep feet propped up when possible follow-up in approximately 3-4 months

## 2014-10-27 ENCOUNTER — Ambulatory Visit: Payer: Medicare Other | Admitting: Family Medicine

## 2014-10-27 ENCOUNTER — Ambulatory Visit (HOSPITAL_COMMUNITY): Payer: Medicare Other

## 2014-10-27 DIAGNOSIS — G20 Parkinson's disease: Secondary | ICD-10-CM

## 2014-10-27 DIAGNOSIS — M545 Low back pain: Secondary | ICD-10-CM

## 2014-10-27 DIAGNOSIS — Z7409 Other reduced mobility: Secondary | ICD-10-CM

## 2014-10-27 DIAGNOSIS — M25552 Pain in left hip: Secondary | ICD-10-CM

## 2014-10-27 DIAGNOSIS — M25662 Stiffness of left knee, not elsewhere classified: Secondary | ICD-10-CM

## 2014-10-27 DIAGNOSIS — Z9181 History of falling: Secondary | ICD-10-CM

## 2014-10-27 DIAGNOSIS — R293 Abnormal posture: Secondary | ICD-10-CM

## 2014-10-27 DIAGNOSIS — R269 Unspecified abnormalities of gait and mobility: Secondary | ICD-10-CM

## 2014-10-27 DIAGNOSIS — R2689 Other abnormalities of gait and mobility: Secondary | ICD-10-CM

## 2014-10-27 DIAGNOSIS — M6281 Muscle weakness (generalized): Secondary | ICD-10-CM

## 2014-10-27 DIAGNOSIS — M6289 Other specified disorders of muscle: Secondary | ICD-10-CM

## 2014-10-27 DIAGNOSIS — R531 Weakness: Secondary | ICD-10-CM

## 2014-10-27 NOTE — Therapy (Signed)
[facility] [address] [place], [place], [address] Phone: [phone]   Fax:  [phone]  Physical Therapy Treatment  Patient Details  Name: Samantha Hudson MRN: [id number] Date of Birth: May 13, 1935 Referring Provider:  Kathyrn Drown, MD  Encounter Date: 10/27/2014      PT End of Session - 10/27/14 1401    Visit Number 15   Number of Visits 30   Date for PT Re-Evaluation 11/05/14   Authorization Type Medicare   Authorization Time Period 09/01/14 to 11/01/14   Authorization - Visit Number 15   Authorization - Number of Visits 30   PT Start Time 1350   PT Stop Time 1438   PT Time Calculation (min) 48 min   Equipment Utilized During Treatment Gait belt   Activity Tolerance Patient tolerated treatment well   Behavior During Therapy [facility] for tasks assessed/performed      Past Medical History  Diagnosis Date  . Hyperlipemia   . IBS (irritable bowel syndrome)   . Osteoporosis   . Fibromyalgia   . Glaucoma     HAD LASER SURGERY - NO EYE DROPS REQUIRED  . Globus hystericus   . Fibromyalgia   . Meningioma   . CAD (coronary artery disease)     HEART STENT PLACED ABOUT 2000- NO LONGER SEES CARDIOLOGIST; NO C/O OF CHEST PAINS OR SOB  . Heart murmur     MVP SINCE THE 60'S - TAKES ATENOLOL -  . Anxiety   . Swelling     BILATERAL FEET AND LEGS - ONSET OF SWELLING APRIL 2014  . GERD (gastroesophageal reflux disease)   . Arthritis   . Fracture   . CVA (cerebral vascular accident)     STROKE 7 YRS AGO - DAY AFTER BRAIN SURGERY TO REMOVE BENIGN MENINGIOMA - HAS LEFT SIDED WEAKNESS AND A LITTLE NUMBNESS  . Pain     "EXCRUCIATING PAIN" LEFT HIP - HAS A FRACTURE - IS CONFINED TO W/C AT HOME - NO WEIGHT BEARING LEFT HIP.    Past Surgical History  Procedure Laterality Date  . Brain surgery      tumor removed  . Tonsillectomy    . Partial hysterectomy    . Glaucoma repair    . Cataract extraction    . Kidney surgery      left -  HX ENLARGED LEFT KIDNEY ON XRAY - SURGERY WAS DONE ON URETER   . Colonoscopy    . Coronary angioplasty    . Hip arthroplasty Left 12/29/2012    Procedure: LEFT HIP HEMI ARTHROPLASTY ;  Surgeon: Mauri Pole, MD;  Location: [facility];  Service: Orthopedics;  Laterality: Left;    There were no vitals filed for this visit.  Visit Diagnosis:  Parkinsonism  Muscle weakness (generalized)  Low back pain without sciatica, unspecified back pain laterality  Stiffness of left knee  Decreased spinal mobility  Hamstring tightness of left lower extremity  Poor posture  Pain in joint, pelvic region and thigh, left  Generalized weakness  Abnormality of gait  Impaired functional mobility, balance, gait, and endurance  Risk for falls      Subjective Assessment - 10/27/14 1357    Subjective Pt stated she has been compliant with HEP, taken pain medication earlier with pain scale 5/10 lower back   Currently in Pain? Yes   Pain Score 5    Pain Orientation Mid;Lower  [facility] PT Treatment/Exercise - 10/27/14 0001    Ambulation/Gait   Ambulation/Gait Yes   Ambulation/Gait Assistance 4: Min guard   Ambulation Distance (Feet) 500 Feet   Assistive device None   Gait Pattern Step-through pattern;Decreased step length - right;Decreased weight shift to left;Decreased trunk rotation;Trunk flexed;Narrow base of support   Ambulation Surface Level   Gait Comments Performed gait with metronome at 50BPM with focus on large steps, heel strike, and reciprocal BUE movement..   Exercises   Exercises Knee/Hip;Neck;Lumbar   Neck Exercises: Seated   Neck Retraction 5 reps;5 secs   Postural Training Educated    Other Seated Exercise 3D thoracic excursion with UE movements 5x each   Neck Exercises: Supine   Neck Retraction 15 reps   Lumbar Exercises: Supine   Bridge 15 reps   Bridge Limitations Lt closer to buttocks   Other Supine Lumbar Exercises Full body  stretch in supine 5 minutes on table with focus on ABD/ER of B UEs, cervical extension, and hip mobility within hip precautions; Towel under    Knee/Hip Exercises: Aerobic   Stationary Bike Nustep seat 9, level 4 at hills setting 3 for 10 minutes    Neck Exercises: Stretches   Corner Stretch 3 reps;30 seconds                  PT Short Term Goals - 10/27/14 1404    PT SHORT TERM GOAL #1   Title Patient will demonstrate improved bilateral lower extremity strength to at least 4-/5 in order to improve overall stability and functional mobility skills    Status Achieved   PT SHORT TERM GOAL #2   Title Patient will demonstrate improved gait mechanics and will be able to ambulate at least 479ft with rolling walker and cues for improved mechanics and posture no more than 40% of the time, minimal fatigue   Status Achieved   PT SHORT TERM GOAL #3   Title Patient will demonstrate the ability to perform supine <-->sit with supervision and cues for sequencing no more than 20% of the time   Status Achieved   PT SHORT TERM GOAL #4   Title Patient will be able to perform sit to stand transfer with supervision, cues for proper form no more than 20% of the time and no posterior loss of balance    Status On-going   PT SHORT TERM GOAL #5   Title Patient and her spouse will be independent in correctly and consistently performing appropriate HEP with emphasis on large, high quality movement patterns    Status Achieved   PT SHORT TERM GOAL #6   Title Patient will experience no more than 3/10 pain in her low back and L hip during all weight bearing tasks and activities    Status On-going   PT SHORT TERM GOAL #7   Title Patient will demonstrate improved spinal mobilty by at least 10 degrees in order to reduce overall stiffness and reduce pain with mobiltiy    Status On-going   PT SHORT TERM GOAL #8   Title Patient will not experience no more than 3/10 pain in her back and L hip during gait distance of  at least 554ft with no assistive device    Status On-going           PT Long Term Goals - 10/27/14 1411    PT LONG TERM GOAL #1   Title Patient will be able to perform TUG assessment in 15 seconds or less on  3/3 attempts, good safety awareness, and good balance throughout test    Status On-going   PT LONG TERM GOAL #2   Title Patient will be able to ambulate at least 1088ft with no assistive device, good posture, improved gait mechanics with equal step lengths, good clearance of floor, improved weight bearing through L leg, and increased stance time bilaterally with no posterior loss of balance and minimal fatigue    Status On-going   PT LONG TERM GOAL #3   Title Patient will be able to perform rolling, supine to sit, and sit to stand transfer with Mod(I) and good safety awareness, good posture, and high quality movements throughout, minimal fall risk    Status On-going   PT LONG TERM GOAL #4   Title Patient will as a whole demonstrate the ability to perform large, high quality movement patterns on a consistent basis with minimal posterior loss of balance and minimal fall risk in order to maximize her quality of life and ease of motion   Status On-going   PT LONG TERM GOAL #5   Title Patient and her spouse to be educated regarding BIG movement program/school of thought for people with Parkinsons; also to be educated regarding any available support/activity groups in the local area    Status On-going   PT LONG TERM GOAL #6   Title Patient will experience no more than 1/10 pain in her low back during all functional upright weight bearing tasks    PT LONG TERM GOAL #7   Title Patient will demonstrate the abilty to ambulate unlimited distances with no assistive device, minimal fatigue, pain no more than 1/10 in her low back    PT LONG TERM GOAL #8   Title Patient will more easily be able to independently perform functional activities such as putting on her shoes and socks without assistance  from her husband                Plan - 10/27/14 1412    Clinical Impression Statement Began session working towards improving thoracic mobilty to improve posture including laying suping with towel roll under thoracic region to reduce kyphosis, thoracic excursion exercise with UE movements to improve extension and pectoralis stretches to reduce forward rolled shoulders.  Strengthening focus on improve glut med and max with cueing and therapist facilitation for proper form and technqiue.  Continued with metrometer set at 50bpm with min cueing to improve reciprocal sequence with UE and LE and to increase step length with Rt LE.  Pt reported pain reduced at end of session.   PT Next Visit Plan Continue supine towel roll stretches for reduction of thoracic kyphosis, tightness in pecs, combined with chin tucks and BUE overhead movements to develop t-spine extension and active ROM. Focus on more L glute med/max strengthening to improve standing SLS, and R step length during gait cycle. PRN manual to L glut med/max. Continue reciprocal tasks with metronome. Sit to stand training. Nustep.         Problem List Patient Active Problem List   Diagnosis Date Noted  . Abnormality of gait 08/12/2014  . Parkinsonism 08/12/2014  . Stroke 08/12/2014  . Muscle weakness (generalized) 02/22/2014  . Stiffness of left knee 02/22/2014  . Hamstring tightness of left lower extremity 02/22/2014  . Pain in joint, pelvic region and thigh 02/22/2014  . Difficulty walking 04/07/2013  . Osteoporosis 03/10/2013  . Anemia 03/10/2013  . Mild malnutrition 03/10/2013  . S/P left hip hemiarthroplasty 12/30/2012  .  Osteoporosis with pathological fracture with delayed healing 11/25/2012  . Pedal edema 10/27/2012  . Fracture of sacrum 10/02/2012  . Stricture and stenosis of esophagus 09/24/2011  . Hyperlipidemia 11/03/2008  . CAD, UNSPECIFIED SITE 11/03/2008   Aldona Lento, PTA  Aldona Lento 10/27/2014,  2:58 PM  [place] [address] [place], [place], [address] Phone: [phone]   Fax:  [phone]

## 2014-10-29 ENCOUNTER — Ambulatory Visit (HOSPITAL_COMMUNITY): Payer: Medicare Other

## 2014-11-01 ENCOUNTER — Ambulatory Visit (HOSPITAL_COMMUNITY): Payer: Medicare Other | Admitting: Physical Therapy

## 2014-11-01 DIAGNOSIS — M545 Low back pain: Secondary | ICD-10-CM

## 2014-11-01 DIAGNOSIS — Z7409 Other reduced mobility: Secondary | ICD-10-CM

## 2014-11-01 DIAGNOSIS — G20 Parkinson's disease: Secondary | ICD-10-CM

## 2014-11-01 DIAGNOSIS — Z9181 History of falling: Secondary | ICD-10-CM

## 2014-11-01 DIAGNOSIS — M25662 Stiffness of left knee, not elsewhere classified: Secondary | ICD-10-CM

## 2014-11-01 DIAGNOSIS — R2689 Other abnormalities of gait and mobility: Secondary | ICD-10-CM

## 2014-11-01 DIAGNOSIS — M25552 Pain in left hip: Secondary | ICD-10-CM

## 2014-11-01 DIAGNOSIS — M6289 Other specified disorders of muscle: Secondary | ICD-10-CM

## 2014-11-01 DIAGNOSIS — R531 Weakness: Secondary | ICD-10-CM

## 2014-11-01 DIAGNOSIS — R293 Abnormal posture: Secondary | ICD-10-CM

## 2014-11-01 DIAGNOSIS — M6281 Muscle weakness (generalized): Secondary | ICD-10-CM

## 2014-11-01 DIAGNOSIS — R269 Unspecified abnormalities of gait and mobility: Secondary | ICD-10-CM

## 2014-11-01 NOTE — Therapy (Signed)
[place], [place], [address] Phone: [phone]   Fax:  [phone]  Physical Therapy Treatment  Patient Details  Name: Samantha Hudson MRN: [id number] Date of Birth: [birth year] Referring Provider:  Marcial Pacas, MD  Encounter Date: 11/01/2014      PT End of Session - 11/01/14 1431    Visit Number 16   Number of Visits 30   Date for PT Re-Evaluation 11/05/14   Authorization Type Medicare   Authorization Time Period 10/13/14 to 12/13/14   Authorization - Visit Number 16   Authorization - Number of Visits 30   PT Start Time 2683   PT Stop Time 1438   PT Time Calculation (min) 51 min   Equipment Utilized During Treatment Gait belt   Activity Tolerance Patient tolerated treatment well   Behavior During Therapy [facility] for tasks assessed/performed      Past Medical History  Diagnosis Date  . Hyperlipemia   . IBS (irritable bowel syndrome)   . Osteoporosis   . Fibromyalgia   . Glaucoma     HAD LASER SURGERY - NO EYE DROPS REQUIRED  . Globus hystericus   . Fibromyalgia   . Meningioma   . CAD (coronary artery disease)     HEART STENT PLACED ABOUT 2000- NO LONGER SEES CARDIOLOGIST; NO C/O OF CHEST PAINS OR SOB  . Heart murmur     MVP SINCE THE 60'S - TAKES ATENOLOL -  . Anxiety   . Swelling     BILATERAL FEET AND LEGS - ONSET OF SWELLING APRIL 2014  . GERD (gastroesophageal reflux disease)   . Arthritis   . Fracture   . CVA (cerebral vascular accident)     STROKE 7 YRS AGO - DAY AFTER BRAIN SURGERY TO REMOVE BENIGN MENINGIOMA - HAS LEFT SIDED WEAKNESS AND A LITTLE NUMBNESS  . Pain     "EXCRUCIATING PAIN" LEFT HIP - HAS A FRACTURE - IS CONFINED TO W/C AT HOME - NO WEIGHT BEARING LEFT HIP.    Past Surgical History  Procedure Laterality Date  . Brain surgery      tumor removed  . Tonsillectomy    . Partial hysterectomy    . Glaucoma repair    . Cataract extraction    . Kidney surgery      left - HX  ENLARGED LEFT KIDNEY ON XRAY - SURGERY WAS DONE ON URETER   . Colonoscopy    . Coronary angioplasty    . Hip arthroplasty Left 12/29/2012    Procedure: LEFT HIP HEMI ARTHROPLASTY ;  Surgeon: Mauri Pole, MD;  Location: [facility];  Service: Orthopedics;  Laterality: Left;    There were no vitals filed for this visit.  Visit Diagnosis:  Parkinsonism  Muscle weakness (generalized)  Low back pain without sciatica, unspecified back pain laterality  Decreased spinal mobility  Hamstring tightness of left lower extremity  Poor posture  Pain in joint, pelvic region and thigh, left  Generalized weakness  Abnormality of gait  Impaired functional mobility, balance, gait, and endurance  Stiffness of left knee  Risk for falls      Subjective Assessment - 11/01/14 1451    Subjective Pt states she went to [place] over the weekend and she hasn't done that since labor day of 2015.  STates it was relaxing.  Currently wtihout pain.  [facility] Adult PT Treatment/Exercise - 11/01/14 0001    Neck Exercises: Seated   Neck Retraction 10 reps   Other Seated Exercise 3D thoracic excursion with UE movements 5x each   Neck Exercises: Supine   Neck Retraction 15 reps   Shoulder Flexion 20 reps   Shoulder Flexion Weights (lbs) green physioball   Other Supine Exercise Supine Tx Spine Extension stretch   Lumbar Exercises: Supine   Bridge 15 reps   Bridge Limitations Lt closer to buttocks   Other Supine Lumbar Exercises Full body stretch in supine 5 minutes on table with focus on ABD/ER of B UEs, cervical extension, and hip mobility within hip precautions; Towel under    Knee/Hip Exercises: Aerobic   Stationary Bike Nustep seat 7, level 4 at hills setting 3 for 10 minutes  UE/LE avg 60 SPM                  PT Short Term Goals - 10/27/14 1404    PT SHORT TERM GOAL #1   Title Patient will demonstrate improved bilateral lower extremity  strength to at least 4-/5 in order to improve overall stability and functional mobility skills    Status Achieved   PT SHORT TERM GOAL #2   Title Patient will demonstrate improved gait mechanics and will be able to ambulate at least 432ft with rolling walker and cues for improved mechanics and posture no more than 40% of the time, minimal fatigue   Status Achieved   PT SHORT TERM GOAL #3   Title Patient will demonstrate the ability to perform supine <-->sit with supervision and cues for sequencing no more than 20% of the time   Status Achieved   PT SHORT TERM GOAL #4   Title Patient will be able to perform sit to stand transfer with supervision, cues for proper form no more than 20% of the time and no posterior loss of balance    Status On-going   PT SHORT TERM GOAL #5   Title Patient and her spouse will be independent in correctly and consistently performing appropriate HEP with emphasis on large, high quality movement patterns    Status Achieved   PT SHORT TERM GOAL #6   Title Patient will experience no more than 3/10 pain in her low back and L hip during all weight bearing tasks and activities    Status On-going   PT SHORT TERM GOAL #7   Title Patient will demonstrate improved spinal mobilty by at least 10 degrees in order to reduce overall stiffness and reduce pain with mobiltiy    Status On-going   PT SHORT TERM GOAL #8   Title Patient will not experience no more than 3/10 pain in her back and L hip during gait distance of at least 562ft with no assistive device    Status On-going           PT Long Term Goals - 10/27/14 1411    PT LONG TERM GOAL #1   Title Patient will be able to perform TUG assessment in 15 seconds or less on 3/3 attempts, good safety awareness, and good balance throughout test    Status On-going   PT LONG TERM GOAL #2   Title Patient will be able to ambulate at least 1019ft with no assistive device, good posture, improved gait mechanics with equal step  lengths, good clearance of floor, improved weight bearing through L leg, and increased stance time bilaterally with no posterior loss of balance and minimal  fatigue    Status On-going   PT LONG TERM GOAL #3   Title Patient will be able to perform rolling, supine to sit, and sit to stand transfer with Mod(I) and good safety awareness, good posture, and high quality movements throughout, minimal fall risk    Status On-going   PT LONG TERM GOAL #4   Title Patient will as a whole demonstrate the ability to perform large, high quality movement patterns on a consistent basis with minimal posterior loss of balance and minimal fall risk in order to maximize her quality of life and ease of motion   Status On-going   PT LONG TERM GOAL #5   Title Patient and her spouse to be educated regarding BIG movement program/school of thought for people with Parkinsons; also to be educated regarding any available support/activity groups in the local area    Status On-going   PT LONG TERM GOAL #6   Title Patient will experience no more than 1/10 pain in her low back during all functional upright weight bearing tasks    PT LONG TERM GOAL #7   Title Patient will demonstrate the abilty to ambulate unlimited distances with no assistive device, minimal fatigue, pain no more than 1/10 in her low back    PT LONG TERM GOAL #8   Title Patient will more easily be able to independently perform functional activities such as putting on her shoes and socks without assistance from her husband                Plan - 11/01/14 1434    Clinical Impression Statement Contiinued focus on improving thoracic and cervical mobility, improving posture, increasing LE strength and gait quality.  Metronome used today at 50bpm with increased ease of reciprocal UE/LE movements.   Noted improvement with closer to neutral postion with supine total body stretch.  Decreased seat distance on nustep today to increase active degree of motion of LE's  to further increase strength.  Average SPM of 62.       PT Next Visit Plan Continue supine towel roll stretches for reduction of thoracic kyphosis, tightness in pecs, combined with chin tucks and BUE overhead movements to develop t-spine extension and active ROM. Focus on more L glute med/max strengthening to improve standing SLS, and R step length during gait cycle. PRN manual to L glut med/max. Continue reciprocal tasks with metronome. Sit to stand training. Nustep.         Problem List Patient Active Problem List   Diagnosis Date Noted  . Abnormality of gait 08/12/2014  . Parkinsonism 08/12/2014  . Stroke 08/12/2014  . Muscle weakness (generalized) 02/22/2014  . Stiffness of left knee 02/22/2014  . Hamstring tightness of left lower extremity 02/22/2014  . Pain in joint, pelvic region and thigh 02/22/2014  . Difficulty walking 04/07/2013  . Osteoporosis 03/10/2013  . Anemia 03/10/2013  . Mild malnutrition 03/10/2013  . S/P left hip hemiarthroplasty 12/30/2012  . Osteoporosis with pathological fracture with delayed healing 11/25/2012  . Pedal edema 10/27/2012  . Fracture of sacrum 10/02/2012  . Stricture and stenosis of esophagus 09/24/2011  . Hyperlipidemia 11/03/2008  . CAD, UNSPECIFIED SITE 11/03/2008    Teena Irani, PTA/CLT [phone]  11/01/2014, 2:52 PM  [facility] [address] [place], [place], [address] Phone: [phone]   Fax:  [phone]

## 2014-11-02 ENCOUNTER — Other Ambulatory Visit: Payer: Self-pay | Admitting: Family Medicine

## 2014-11-02 ENCOUNTER — Ambulatory Visit (HOSPITAL_COMMUNITY): Payer: Medicare Other

## 2014-11-02 MED ORDER — GABAPENTIN 100 MG PO CAPS: ORAL_CAPSULE | ORAL | Status: DC

## 2014-11-02 NOTE — Telephone Encounter (Signed)
Left message on voicemail notifying patient refill was sent to pharmacy.  

## 2014-11-02 NOTE — Telephone Encounter (Signed)
Patient's husband called needing refill called in for gabapentin 100 mg (2) in am (1) in afternoon. Patient was seen 6/7 and need this called into [facility].

## 2014-11-03 ENCOUNTER — Ambulatory Visit (HOSPITAL_COMMUNITY): Payer: Medicare Other | Admitting: Physical Therapy

## 2014-11-03 DIAGNOSIS — M6281 Muscle weakness (generalized): Secondary | ICD-10-CM

## 2014-11-03 DIAGNOSIS — R293 Abnormal posture: Secondary | ICD-10-CM

## 2014-11-03 DIAGNOSIS — M6289 Other specified disorders of muscle: Secondary | ICD-10-CM

## 2014-11-03 DIAGNOSIS — M545 Low back pain: Secondary | ICD-10-CM

## 2014-11-03 DIAGNOSIS — G20 Parkinson's disease: Secondary | ICD-10-CM

## 2014-11-03 DIAGNOSIS — Z9181 History of falling: Secondary | ICD-10-CM

## 2014-11-03 DIAGNOSIS — M25552 Pain in left hip: Secondary | ICD-10-CM

## 2014-11-03 DIAGNOSIS — R2689 Other abnormalities of gait and mobility: Secondary | ICD-10-CM

## 2014-11-03 NOTE — Therapy (Signed)
[place], [place], [address] Phone: [phone]   Fax:  [phone]  Physical Therapy Treatment  Patient Details  Name: Samantha Hudson MRN: [id number] Date of Birth: 1935-01-10 Referring Provider:  Kathyrn Drown, MD  Encounter Date: 11/03/2014      PT End of Session - 11/03/14 1429    Visit Number 17   Number of Visits 30   Date for PT Re-Evaluation 11/05/14   Authorization Type Medicare   Authorization Time Period 10/13/14 to 12/13/14   Authorization - Visit Number 72   Authorization - Number of Visits 30   PT Start Time 6269   PT Stop Time 1434   PT Time Calculation (min) 38 min   Equipment Utilized During Treatment Gait belt   Activity Tolerance Patient tolerated treatment well   Behavior During Therapy [facility] for tasks assessed/performed      Past Medical History  Diagnosis Date  . Hyperlipemia   . IBS (irritable bowel syndrome)   . Osteoporosis   . Fibromyalgia   . Glaucoma     HAD LASER SURGERY - NO EYE DROPS REQUIRED  . Globus hystericus   . Fibromyalgia   . Meningioma   . CAD (coronary artery disease)     HEART STENT PLACED ABOUT 2000- NO LONGER SEES CARDIOLOGIST; NO C/O OF CHEST PAINS OR SOB  . Heart murmur     MVP SINCE THE 60'S - TAKES ATENOLOL -  . Anxiety   . Swelling     BILATERAL FEET AND LEGS - ONSET OF SWELLING APRIL 2014  . GERD (gastroesophageal reflux disease)   . Arthritis   . Fracture   . CVA (cerebral vascular accident)     STROKE 7 YRS AGO - DAY AFTER BRAIN SURGERY TO REMOVE BENIGN MENINGIOMA - HAS LEFT SIDED WEAKNESS AND A LITTLE NUMBNESS  . Pain     "EXCRUCIATING PAIN" LEFT HIP - HAS A FRACTURE - IS CONFINED TO W/C AT HOME - NO WEIGHT BEARING LEFT HIP.    Past Surgical History  Procedure Laterality Date  . Brain surgery      tumor removed  . Tonsillectomy    . Partial hysterectomy    . Glaucoma repair    . Cataract extraction    . Kidney surgery      left -  HX ENLARGED LEFT KIDNEY ON XRAY - SURGERY WAS DONE ON URETER   . Colonoscopy    . Coronary angioplasty    . Hip arthroplasty Left 12/29/2012    Procedure: LEFT HIP HEMI ARTHROPLASTY ;  Surgeon: Mauri Pole, MD;  Location: [facility];  Service: Orthopedics;  Laterality: Left;    There were no vitals filed for this visit.  Visit Diagnosis:  Parkinsonism  Muscle weakness (generalized)  Low back pain without sciatica, unspecified back pain laterality  Decreased spinal mobility  Hamstring tightness of left lower extremity  Poor posture  Pain in joint, pelvic region and thigh, left  Risk for falls      Subjective Assessment - 11/03/14 1358    Subjective Patient states she is not having any pain today, but does state that she is having some difficulty in reaching overhead    Pertinent History Diagnosed with Parkinsons around 30 days ago; also started taking Sinemet recently. Patient had some trouble getting over posterior hip surgery she had in 2014, symptoms pointed MD to diagnose new onset Parkinsons. 5/25- patient has recently started to experience new onset  low back pain, referral received from MD and evaluation completed with new updates to program added in today.    Currently in Pain? No/denies                         [facility] Adult PT Treatment/Exercise - 11/03/14 0001    Ambulation/Gait   Ambulation/Gait Yes   Ambulation/Gait Assistance 4: Min guard   Ambulation Distance (Feet) 226 Feet   Assistive device None   Gait Pattern Step-through pattern;Decreased step length - right;Decreased weight shift to left;Decreased trunk rotation;Trunk flexed;Narrow base of support   Ambulation Surface Level   Gait Comments Gait without metronome with focus on improved step lengtsh and safe navigation of corners; also navigation between cones placed sequentially closer and closer together to promote balance, balance reaction skills, good mechanics while rounding corners     Lumbar Exercises: Supine   Other Supine Lumbar Exercises Full body stretch in supine 5 minutes on table with focus on ABD/ER of B UEs, cervical extension, and hip mobility within hip precautions   Knee/Hip Exercises: Aerobic   Stationary Bike Nustep seat 9, level 4 at hills setting 3 for 10 minutes                 PT Education - 11/03/14 1429    Education provided No          PT Short Term Goals - 10/27/14 1404    PT SHORT TERM GOAL #1   Title Patient will demonstrate improved bilateral lower extremity strength to at least 4-/5 in order to improve overall stability and functional mobility skills    Status Achieved   PT SHORT TERM GOAL #2   Title Patient will demonstrate improved gait mechanics and will be able to ambulate at least 476ft with rolling walker and cues for improved mechanics and posture no more than 40% of the time, minimal fatigue   Status Achieved   PT SHORT TERM GOAL #3   Title Patient will demonstrate the ability to perform supine <-->sit with supervision and cues for sequencing no more than 20% of the time   Status Achieved   PT SHORT TERM GOAL #4   Title Patient will be able to perform sit to stand transfer with supervision, cues for proper form no more than 20% of the time and no posterior loss of balance    Status On-going   PT SHORT TERM GOAL #5   Title Patient and her spouse will be independent in correctly and consistently performing appropriate HEP with emphasis on large, high quality movement patterns    Status Achieved   PT SHORT TERM GOAL #6   Title Patient will experience no more than 3/10 pain in her low back and L hip during all weight bearing tasks and activities    Status On-going   PT SHORT TERM GOAL #7   Title Patient will demonstrate improved spinal mobilty by at least 10 degrees in order to reduce overall stiffness and reduce pain with mobiltiy    Status On-going   PT SHORT TERM GOAL #8   Title Patient will not experience no more than  3/10 pain in her back and L hip during gait distance of at least 51ft with no assistive device    Status On-going           PT Long Term Goals - 10/27/14 1411    PT LONG TERM GOAL #1   Title Patient will be able to  perform TUG assessment in 15 seconds or less on 3/3 attempts, good safety awareness, and good balance throughout test    Status On-going   PT LONG TERM GOAL #2   Title Patient will be able to ambulate at least 1048ft with no assistive device, good posture, improved gait mechanics with equal step lengths, good clearance of floor, improved weight bearing through L leg, and increased stance time bilaterally with no posterior loss of balance and minimal fatigue    Status On-going   PT LONG TERM GOAL #3   Title Patient will be able to perform rolling, supine to sit, and sit to stand transfer with Mod(I) and good safety awareness, good posture, and high quality movements throughout, minimal fall risk    Status On-going   PT LONG TERM GOAL #4   Title Patient will as a whole demonstrate the ability to perform large, high quality movement patterns on a consistent basis with minimal posterior loss of balance and minimal fall risk in order to maximize her quality of life and ease of motion   Status On-going   PT LONG TERM GOAL #5   Title Patient and her spouse to be educated regarding BIG movement program/school of thought for people with Parkinsons; also to be educated regarding any available support/activity groups in the local area    Status On-going   PT LONG TERM GOAL #6   Title Patient will experience no more than 1/10 pain in her low back during all functional upright weight bearing tasks    PT LONG TERM GOAL #7   Title Patient will demonstrate the abilty to ambulate unlimited distances with no assistive device, minimal fatigue, pain no more than 1/10 in her low back    PT LONG TERM GOAL #8   Title Patient will more easily be able to independently perform functional activities  such as putting on her shoes and socks without assistance from her husband                Plan - 11/03/14 1430    Clinical Impression Statement Focus on full body stretch and gait around obstalces today, also functional activity tolerance. Patient does demonstrate reduced quality of gait pattern and increased unsteadiness when rounding corners and during cone based tasks. Also performed functional activity tolerance tasks today.    Pt will benefit from skilled therapeutic intervention in order to improve on the following deficits Abnormal gait;Decreased coordination;Difficulty walking;Impaired flexibility;Improper body mechanics;Postural dysfunction;Decreased safety awareness;Decreased endurance;Decreased activity tolerance;Decreased balance;Decreased mobility;Decreased strength;Other (comment);Hypomobility;Pain;Decreased range of motion   Rehab Potential Good   PT Treatment/Interventions ADLs/Self Care Home Management;Gait training;Neuromuscular re-education;Visual/perceptual remediation/compensation;Stair training;Biofeedback;Functional mobility training;Patient/family education;Passive range of motion;Therapeutic activities;Therapeutic exercise;Manual techniques;Energy conservation;DME Instruction;Balance training   PT Next Visit Plan Re-assess next visit. Navigation around corners and obstacles. Continue supine towel roll stretches for reduction of thoracic kyphosis, tightness in pecs, combined with chin tucks and BUE overhead movements to develop t-spine extension and active ROM. Focus on more L glute med/max strengthening to improve standing SLS, and R step length during gait cycle. PRN manual to L glut med/max. Continue reciprocal tasks with metronome. Sit to stand training. Nustep.    PT Home Exercise Plan Continue focus on big motion standing activities challenging coordination and sequencing.  Continue full body stretches initially for session and gait training with metronome for cadence.   Progress difficulty as able.    Consulted and Agree with Plan of Care Patient        Problem List Patient Active  Problem List   Diagnosis Date Noted  . Abnormality of gait 08/12/2014  . Parkinsonism 08/12/2014  . Stroke 08/12/2014  . Muscle weakness (generalized) 02/22/2014  . Stiffness of left knee 02/22/2014  . Hamstring tightness of left lower extremity 02/22/2014  . Pain in joint, pelvic region and thigh 02/22/2014  . Difficulty walking 04/07/2013  . Osteoporosis 03/10/2013  . Anemia 03/10/2013  . Mild malnutrition 03/10/2013  . S/P left hip hemiarthroplasty 12/30/2012  . Osteoporosis with pathological fracture with delayed healing 11/25/2012  . Pedal edema 10/27/2012  . Fracture of sacrum 10/02/2012  . Stricture and stenosis of esophagus 09/24/2011  . Hyperlipidemia 11/03/2008  . CAD, UNSPECIFIED SITE 11/03/2008    Deniece Ree PT, DPT [phone]  [place] [address] [place], [place], [address] Phone: [phone]   Fax:  [phone]

## 2014-11-05 ENCOUNTER — Ambulatory Visit (HOSPITAL_COMMUNITY): Payer: Medicare Other

## 2014-11-05 DIAGNOSIS — M545 Low back pain: Secondary | ICD-10-CM

## 2014-11-05 DIAGNOSIS — G20 Parkinson's disease: Secondary | ICD-10-CM

## 2014-11-05 DIAGNOSIS — R293 Abnormal posture: Secondary | ICD-10-CM

## 2014-11-05 DIAGNOSIS — Z7409 Other reduced mobility: Secondary | ICD-10-CM

## 2014-11-05 DIAGNOSIS — Z9181 History of falling: Secondary | ICD-10-CM

## 2014-11-05 DIAGNOSIS — M25662 Stiffness of left knee, not elsewhere classified: Secondary | ICD-10-CM

## 2014-11-05 DIAGNOSIS — R269 Unspecified abnormalities of gait and mobility: Secondary | ICD-10-CM

## 2014-11-05 DIAGNOSIS — M25552 Pain in left hip: Secondary | ICD-10-CM

## 2014-11-05 DIAGNOSIS — M6281 Muscle weakness (generalized): Secondary | ICD-10-CM

## 2014-11-05 DIAGNOSIS — R2689 Other abnormalities of gait and mobility: Secondary | ICD-10-CM

## 2014-11-05 DIAGNOSIS — R531 Weakness: Secondary | ICD-10-CM

## 2014-11-05 DIAGNOSIS — M6289 Other specified disorders of muscle: Secondary | ICD-10-CM

## 2014-11-05 NOTE — Therapy (Signed)
[place], [place], [address] Phone: [phone]   Fax:  [phone]  Physical Therapy Treatment  Patient Details  Name: Samantha Hudson MRN: [id number] Date of Birth: 05-Nov-1934 Referring Provider:  Kathyrn Drown, MD  Encounter Date: 11/05/2014      PT End of Session - 11/05/14 1644    Visit Number 18   Number of Visits 30   Authorization Type Medicare   Authorization Time Period 10/13/14 to 12/13/14   Authorization - Visit Number 18   Authorization - Number of Visits 30   PT Start Time 7412   PT Stop Time 1344   PT Time Calculation (min) 39 min   Equipment Utilized During Treatment Gait belt   Activity Tolerance Patient tolerated treatment well   Behavior During Therapy [facility] for tasks assessed/performed      Past Medical History  Diagnosis Date  . Hyperlipemia   . IBS (irritable bowel syndrome)   . Osteoporosis   . Fibromyalgia   . Glaucoma     HAD LASER SURGERY - NO EYE DROPS REQUIRED  . Globus hystericus   . Fibromyalgia   . Meningioma   . CAD (coronary artery disease)     HEART STENT PLACED ABOUT 2000- NO LONGER SEES CARDIOLOGIST; NO C/O OF CHEST PAINS OR SOB  . Heart murmur     MVP SINCE THE 60'S - TAKES ATENOLOL -  . Anxiety   . Swelling     BILATERAL FEET AND LEGS - ONSET OF SWELLING APRIL 2014  . GERD (gastroesophageal reflux disease)   . Arthritis   . Fracture   . CVA (cerebral vascular accident)     STROKE 7 YRS AGO - DAY AFTER BRAIN SURGERY TO REMOVE BENIGN MENINGIOMA - HAS LEFT SIDED WEAKNESS AND A LITTLE NUMBNESS  . Pain     "EXCRUCIATING PAIN" LEFT HIP - HAS A FRACTURE - IS CONFINED TO W/C AT HOME - NO WEIGHT BEARING LEFT HIP.    Past Surgical History  Procedure Laterality Date  . Brain surgery      tumor removed  . Tonsillectomy    . Partial hysterectomy    . Glaucoma repair    . Cataract extraction    . Kidney surgery      left - HX ENLARGED LEFT KIDNEY ON XRAY -  SURGERY WAS DONE ON URETER   . Colonoscopy    . Coronary angioplasty    . Hip arthroplasty Left 12/29/2012    Procedure: LEFT HIP HEMI ARTHROPLASTY ;  Surgeon: Mauri Pole, MD;  Location: [facility];  Service: Orthopedics;  Laterality: Left;    There were no vitals filed for this visit.  Visit Diagnosis:  Parkinsonism  Generalized weakness  Muscle weakness (generalized)  Abnormality of gait  Low back pain without sciatica, unspecified back pain laterality  Impaired functional mobility, balance, gait, and endurance  Decreased spinal mobility  Stiffness of left knee  Hamstring tightness of left lower extremity  Poor posture  Pain in joint, pelvic region and thigh, left  Risk for falls      Subjective Assessment - 11/05/14 1310    Subjective Pt reports that she has been feeling better in her back. Pt reports she has been walking more at home, but the weather has made compiance difficult on some days.    Patient is accompained by: Family member   Pertinent History Diagnosed with Parkinsons around 30 days ago; also started taking Sinemet recently.  Patient had some trouble getting over posterior hip surgery she had in 2014, symptoms pointed MD to diagnose new onset Parkinsons. 5/25- patient has recently started to experience new onset low back pain, referral received from MD and evaluation completed with new updates to program added in today.    Currently in Pain? No/denies   Pain Score 0-No pain   Pain Location --  intermittently between shoulder blades, ususally by end of day.                          [facility] Adult PT Treatment/Exercise - 11/05/14 1314    Ambulation/Gait   Ambulation/Gait Yes   Ambulation/Gait Assistance 4: Min guard   Ambulation Distance (Feet) 450 Feet   Assistive device Other (Comment)  single HHA    Gait Pattern Step-to pattern  bilat with very big steps; 3 LOB recovered indep.    Ambulation Surface Level   Gait Comments Gait  without metronome with focus on improved step lengtsh and safe navigation of corners; also navigation between cones placed sequentially closer and closer together to promote balance, balance reaction skills, good mechanics while rounding corners    Neck Exercises: Seated   Neck Retraction 10 reps;3 secs  into two pillows   Neck Exercises: Supine   Shoulder Flexion 20 reps   Shoulder Flexion Weights (lbs) green physioball  tactile cues for overhead trust for scap depression   Other Supine Exercise Sidelying thoracic rotation AROM  using UE and gaze to guide movement.    Lumbar Exercises: Seated   Other Seated Lumbar Exercises Seated Rows: Green TB  2x12   Lumbar Exercises: Supine   Bridge 15 reps   Other Supine Lumbar Exercises Full body stretch in supine 5 minutes on table with focus on ABD/ER of B UEs, cervical extension, and hip mobility within hip precautions   Other Supine Lumbar Exercises seated overhead ball toss/catch  orange volley ball  2x10                PT Education - 11/05/14 1644    Education provided No          PT Short Term Goals - 10/27/14 1404    PT SHORT TERM GOAL #1   Title Patient will demonstrate improved bilateral lower extremity strength to at least 4-/5 in order to improve overall stability and functional mobility skills    Status Achieved   PT SHORT TERM GOAL #2   Title Patient will demonstrate improved gait mechanics and will be able to ambulate at least 491ft with rolling walker and cues for improved mechanics and posture no more than 40% of the time, minimal fatigue   Status Achieved   PT SHORT TERM GOAL #3   Title Patient will demonstrate the ability to perform supine <-->sit with supervision and cues for sequencing no more than 20% of the time   Status Achieved   PT SHORT TERM GOAL #4   Title Patient will be able to perform sit to stand transfer with supervision, cues for proper form no more than 20% of the time and no posterior loss of  balance    Status On-going   PT SHORT TERM GOAL #5   Title Patient and her spouse will be independent in correctly and consistently performing appropriate HEP with emphasis on large, high quality movement patterns    Status Achieved   PT SHORT TERM GOAL #6   Title Patient will experience no more than  3/10 pain in her low back and L hip during all weight bearing tasks and activities    Status On-going   PT SHORT TERM GOAL #7   Title Patient will demonstrate improved spinal mobilty by at least 10 degrees in order to reduce overall stiffness and reduce pain with mobiltiy    Status On-going   PT SHORT TERM GOAL #8   Title Patient will not experience no more than 3/10 pain in her back and L hip during gait distance of at least 579ft with no assistive device    Status On-going           PT Long Term Goals - 10/27/14 1411    PT LONG TERM GOAL #1   Title Patient will be able to perform TUG assessment in 15 seconds or less on 3/3 attempts, good safety awareness, and good balance throughout test    Status On-going   PT LONG TERM GOAL #2   Title Patient will be able to ambulate at least 1084ft with no assistive device, good posture, improved gait mechanics with equal step lengths, good clearance of floor, improved weight bearing through L leg, and increased stance time bilaterally with no posterior loss of balance and minimal fatigue    Status On-going   PT LONG TERM GOAL #3   Title Patient will be able to perform rolling, supine to sit, and sit to stand transfer with Mod(I) and good safety awareness, good posture, and high quality movements throughout, minimal fall risk    Status On-going   PT LONG TERM GOAL #4   Title Patient will as a whole demonstrate the ability to perform large, high quality movement patterns on a consistent basis with minimal posterior loss of balance and minimal fall risk in order to maximize her quality of life and ease of motion   Status On-going   PT LONG TERM GOAL  #5   Title Patient and her spouse to be educated regarding BIG movement program/school of thought for people with Parkinsons; also to be educated regarding any available support/activity groups in the local area    Status On-going   PT LONG TERM GOAL #6   Title Patient will experience no more than 1/10 pain in her low back during all functional upright weight bearing tasks    PT LONG TERM GOAL #7   Title Patient will demonstrate the abilty to ambulate unlimited distances with no assistive device, minimal fatigue, pain no more than 1/10 in her low back    PT LONG TERM GOAL #8   Title Patient will more easily be able to independently perform functional activities such as putting on her shoes and socks without assistance from her husband                Plan - 11/05/14 1645    Clinical Impression Statement Extra focus this session on lumbo thoracic mobility, which pt appears to be responding well to, with less notable kyphosis than previosu session. Gait continues to improve with bigger steps and fewer LOB. Pt to constinue tofocus on posture and strenght in periscapular area.    Pt will benefit from skilled therapeutic intervention in order to improve on the following deficits Abnormal gait;Decreased coordination;Difficulty walking;Impaired flexibility;Improper body mechanics;Postural dysfunction;Decreased safety awareness;Decreased endurance;Decreased activity tolerance;Decreased balance;Decreased mobility;Decreased strength;Other (comment);Hypomobility;Pain;Decreased range of motion   Rehab Potential Good   PT Frequency 3x / week   PT Duration 6 weeks   PT Treatment/Interventions ADLs/Self Care Home Management;Gait training;Neuromuscular re-education;Visual/perceptual remediation/compensation;Stair training;Biofeedback;Functional  mobility training;Patient/family education;Passive range of motion;Therapeutic activities;Therapeutic exercise;Manual techniques;Energy conservation;DME  Instruction;Balance training   PT Next Visit Plan Re-assess next visit. Navigation around corners and obstacles. Continue supine towel roll stretches for reduction of thoracic kyphosis, tightness in pecs, combined with chin tucks and BUE overhead movements to develop t-spine extension and active ROM. Focus on more L glute med/max strengthening to improve standing SLS, and R step length during gait cycle. PRN manual to L glut med/max. Continue reciprocal tasks with metronome. Sit to stand training. Nustep.    PT Home Exercise Plan No Changes.    Consulted and Agree with Plan of Care Patient        Problem List Patient Active Problem List   Diagnosis Date Noted  . Abnormality of gait 08/12/2014  . Parkinsonism 08/12/2014  . Stroke 08/12/2014  . Muscle weakness (generalized) 02/22/2014  . Stiffness of left knee 02/22/2014  . Hamstring tightness of left lower extremity 02/22/2014  . Pain in joint, pelvic region and thigh 02/22/2014  . Difficulty walking 04/07/2013  . Osteoporosis 03/10/2013  . Anemia 03/10/2013  . Mild malnutrition 03/10/2013  . S/P left hip hemiarthroplasty 12/30/2012  . Osteoporosis with pathological fracture with delayed healing 11/25/2012  . Pedal edema 10/27/2012  . Fracture of sacrum 10/02/2012  . Stricture and stenosis of esophagus 09/24/2011  . Hyperlipidemia 11/03/2008  . CAD, UNSPECIFIED SITE 11/03/2008    Hershy Flenner C 11/05/2014, 4:49 PM 4:49 PM  Etta Grandchild, PT, DPT [place] License # [id number]       [facility] [address] [place], [place], [address] Phone: [phone]   Fax:  [phone]

## 2014-11-08 ENCOUNTER — Ambulatory Visit (HOSPITAL_COMMUNITY): Payer: Medicare Other | Admitting: Physical Therapy

## 2014-11-10 ENCOUNTER — Ambulatory Visit (HOSPITAL_COMMUNITY): Payer: Medicare Other | Admitting: Physical Therapy

## 2014-11-10 DIAGNOSIS — R269 Unspecified abnormalities of gait and mobility: Secondary | ICD-10-CM

## 2014-11-10 DIAGNOSIS — Z9181 History of falling: Secondary | ICD-10-CM

## 2014-11-10 DIAGNOSIS — G20 Parkinson's disease: Secondary | ICD-10-CM | POA: Diagnosis not present

## 2014-11-10 DIAGNOSIS — R2689 Other abnormalities of gait and mobility: Secondary | ICD-10-CM

## 2014-11-10 DIAGNOSIS — Z7409 Other reduced mobility: Secondary | ICD-10-CM

## 2014-11-10 DIAGNOSIS — R531 Weakness: Secondary | ICD-10-CM

## 2014-11-10 DIAGNOSIS — M545 Low back pain: Secondary | ICD-10-CM

## 2014-11-10 DIAGNOSIS — R293 Abnormal posture: Secondary | ICD-10-CM

## 2014-11-10 DIAGNOSIS — M6281 Muscle weakness (generalized): Secondary | ICD-10-CM

## 2014-11-10 NOTE — Therapy (Signed)
[facility] [address] [place], [place], [address] Phone: [phone]   Fax:  [phone]  Physical Therapy Treatment (Re-Assessment)  Patient Details  Name: Samantha Hudson MRN: [id number] Date of Birth: 1934/07/25 Referring Provider:  Kathyrn Drown, MD  Encounter Date: 11/10/2014      PT End of Session - 11/10/14 1639    Visit Number 19   Number of Visits 30   Date for PT Re-Evaluation 12/08/14   Authorization Type Medicare   Authorization Time Period 10/13/14 to 12/13/14; G-code done 19th session    Authorization - Visit Number 19   Authorization - Number of Visits 30   PT Start Time 1303   PT Stop Time 1352   PT Time Calculation (min) 49 min   Equipment Utilized During Treatment Gait belt   Activity Tolerance Patient tolerated treatment well   Behavior During Therapy [facility] for tasks assessed/performed      Past Medical History  Diagnosis Date  . Hyperlipemia   . IBS (irritable bowel syndrome)   . Osteoporosis   . Fibromyalgia   . Glaucoma     HAD LASER SURGERY - NO EYE DROPS REQUIRED  . Globus hystericus   . Fibromyalgia   . Meningioma   . CAD (coronary artery disease)     HEART STENT PLACED ABOUT 2000- NO LONGER SEES CARDIOLOGIST; NO C/O OF CHEST PAINS OR SOB  . Heart murmur     MVP SINCE THE 60'S - TAKES ATENOLOL -  . Anxiety   . Swelling     BILATERAL FEET AND LEGS - ONSET OF SWELLING APRIL 2014  . GERD (gastroesophageal reflux disease)   . Arthritis   . Fracture   . CVA (cerebral vascular accident)     STROKE 7 YRS AGO - DAY AFTER BRAIN SURGERY TO REMOVE BENIGN MENINGIOMA - HAS LEFT SIDED WEAKNESS AND A LITTLE NUMBNESS  . Pain     "EXCRUCIATING PAIN" LEFT HIP - HAS A FRACTURE - IS CONFINED TO W/C AT HOME - NO WEIGHT BEARING LEFT HIP.    Past Surgical History  Procedure Laterality Date  . Brain surgery      tumor removed  . Tonsillectomy    . Partial hysterectomy    . Glaucoma repair    . Cataract  extraction    . Kidney surgery      left - HX ENLARGED LEFT KIDNEY ON XRAY - SURGERY WAS DONE ON URETER   . Colonoscopy    . Coronary angioplasty    . Hip arthroplasty Left 12/29/2012    Procedure: LEFT HIP HEMI ARTHROPLASTY ;  Surgeon: Mauri Pole, MD;  Location: [facility];  Service: Orthopedics;  Laterality: Left;    There were no vitals filed for this visit.  Visit Diagnosis:  Parkinsonism  Generalized weakness  Muscle weakness (generalized)  Abnormality of gait  Low back pain without sciatica, unspecified back pain laterality  Impaired functional mobility, balance, gait, and endurance  Decreased spinal mobility  Poor posture  Risk for falls      Subjective Assessment - 11/10/14 1313    Subjective Patient reports she is doing well today, things are going well at home and she is having an easier time moving around in general.    Patient is accompained by: Family member   Pertinent History Diagnosed with Parkinsons around 30 days ago; also started taking Sinemet recently. Patient had some trouble getting over posterior hip surgery she had in 2014, symptoms  pointed MD to diagnose new onset Parkinsons. 5/25- patient has recently started to experience new onset low back pain, referral received from MD and evaluation completed with new updates to program added in today.    How long can you stand comfortably? 6/22- 45-60 minutes    How long can you walk comfortably? 6/22-  at least 15 minutes    Currently in Pain? Yes   Pain Score 4    Pain Location Hip   Pain Orientation Left            [facility] PT Assessment - 11/10/14 0001    Observation/Other Assessments   Focus on Therapeutic Outcomes (FOTO)  48% limited    AROM   Lumbar - Right Side Bend 16   Lumbar - Left Side Bend 20   Thoracic Flexion 41   Thoracic Extension 10   Thoracic - Right Side Bend 32   Thoracic - Left Side Bend 31   Thoracic - Right Rotation 40   Thoracic - Left Rotation 40   Strength   Right Hip  ABduction 3+/5   Left Hip ABduction 3+/5   Right Knee Flexion 4-/5   Right Knee Extension 4/5   Left Knee Flexion 4-/5   Left Knee Extension 4/5   Right Ankle Dorsiflexion 4+/5   Left Ankle Dorsiflexion 4+/5   Bed Mobility   Rolling Left 6: Modified independent (Device/Increase time)   Supine to Sit 6: Modified independent (Device/Increase time)   Sitting - Scoot to Edge of Bed 6: Modified independent (Device/Increase time)   Sit to Supine 6: Modified independent (Device/Increase time)   Transfers   Sit to Stand 5: Supervision   Stand to Sit 5: Supervision   Ambulation/Gait   Ambulation/Gait Yes   Ambulation/Gait Assistance 4: Min guard   Ambulation Distance (Feet) 675 Feet   Assistive device None   Gait Pattern Step-through pattern;Decreased step length - right;Decreased weight shift to left;Decreased trunk rotation;Trunk flexed;Narrow base of support   Ambulation Surface Level   Gait Comments 6 minute walk                              PT Education - 11/10/14 1639    Education provided Yes   Education Details plan of care moving forward   Person(s) Educated Patient   Methods Explanation   Comprehension Verbalized understanding          PT Short Term Goals - 11/10/14 1341    PT SHORT TERM GOAL #1   Title Patient will demonstrate improved bilateral lower extremity strength to at least 4-/5 in order to improve overall stability and functional mobility skills    Time 3   Period Weeks   Status Achieved   PT SHORT TERM GOAL #2   Title Patient will demonstrate improved gait mechanics and will be able to ambulate at least 449ft with rolling walker and cues for improved mechanics and posture no more than 40% of the time, minimal fatigue   Time 3   Period Weeks   Status Achieved   PT SHORT TERM GOAL #3   Title Patient will demonstrate the ability to perform supine <-->sit with supervision and cues for sequencing no more than 20% of the time   Time 3    Period Weeks   Status Achieved   PT SHORT TERM GOAL #4   Title Patient will be able to perform sit to stand transfer with supervision, cues for  proper form no more than 20% of the time and no posterior loss of balance    Baseline 6/22- able to do from some surfaces, other times still has difficulty    Time 3   Period Weeks   Status On-going   PT SHORT TERM GOAL #5   Title Patient and her spouse will be independent in correctly and consistently performing appropriate HEP with emphasis on large, high quality movement patterns    Time 3   Period Weeks   Status Achieved   PT SHORT TERM GOAL #6   Title Patient will experience no more than 3/10 pain in her low back and L hip during all weight bearing tasks and activities    Time 3   Period Weeks   Status On-going   PT SHORT TERM GOAL #7   Title Patient will demonstrate improved spinal mobilty by at least 10 degrees in order to reduce overall stiffness and reduce pain with mobiltiy    Time 3   Period Weeks   Status On-going   PT SHORT TERM GOAL #8   Title Patient will not experience no more than 3/10 pain in her back and L hip during gait distance of at least 567ft with no assistive device    Time 3   Period Weeks   Status On-going           PT Long Term Goals - 11/10/14 1345    PT LONG TERM GOAL #1   Title Patient will be able to perform TUG assessment in 15 seconds or less on 3/3 attempts, good safety awareness, and good balance throughout test    Baseline 6/22- DNT today    Time 6   Period Weeks   Status On-going   PT LONG TERM GOAL #2   Title Patient will be able to ambulate at least 102ft with no assistive device, good posture, improved gait mechanics with equal step lengths, good clearance of floor, improved weight bearing through L leg, and increased stance time bilaterally with no posterior loss of balance and minimal fatigue    Time 6   Period Weeks   Status On-going   PT LONG TERM GOAL #3   Title Patient will be  able to perform rolling, supine to sit, and sit to stand transfer with Mod(I) and good safety awareness, good posture, and high quality movements throughout, minimal fall risk    Time 6   Period Weeks   Status On-going   PT LONG TERM GOAL #4   Title Patient will as a whole demonstrate the ability to perform large, high quality movement patterns on a consistent basis with minimal posterior loss of balance and minimal fall risk in order to maximize her quality of life and ease of motion   Time 4   Period Weeks   Status On-going   PT LONG TERM GOAL #5   Title Patient and her spouse to be educated regarding BIG movement program/school of thought for people with Parkinsons; also to be educated regarding any available support/activity groups in the local area    Time 6   Period Weeks   Status On-going   PT LONG TERM GOAL #6   Title Patient will experience no more than 1/10 pain in her low back during all functional upright weight bearing tasks    Time 6   Period Weeks   Status On-going   PT LONG TERM GOAL #7   Title Patient will demonstrate the abilty to ambulate unlimited  distances with no assistive device, minimal fatigue, pain no more than 1/10 in her low back    Time 6   Period Weeks   Status On-going   PT LONG TERM GOAL #8   Title Patient will more easily be able to independently perform functional activities such as putting on her shoes and socks without assistance from her husband    Time 6   Period Weeks   Status On-going               Plan - 11/12/2014 1640    Clinical Impression Statement Re-assessment performed today. Patient demonstrates improving mobilty but continues to experience stiffness in her low back as well as difficulty with functional tasks such as reaching down to apply her shoes by herself; she also continues to have difficulty with functional activity tolerance, gait speed, does have occasional posterior LOB, and is challenged by navigating around tight turns  and obstacles. Patient also continues to display postural deviations. She will continue to benefit from skilled PT services with a focus on balance, gait mechanics, functional activity tolerance, posture, and on her low back defiicts for 4 more weeks, at 2x/week, to continue to address her impairments.    Pt will benefit from skilled therapeutic intervention in order to improve on the following deficits Abnormal gait;Decreased coordination;Difficulty walking;Impaired flexibility;Improper body mechanics;Postural dysfunction;Decreased safety awareness;Decreased endurance;Decreased activity tolerance;Decreased balance;Decreased mobility;Decreased strength;Other (comment);Hypomobility;Pain;Decreased range of motion   Rehab Potential Good   PT Frequency 2x / week   PT Duration 4 weeks   PT Treatment/Interventions ADLs/Self Care Home Management;Gait training;Neuromuscular re-education;Visual/perceptual remediation/compensation;Stair training;Biofeedback;Functional mobility training;Patient/family education;Passive range of motion;Therapeutic activities;Therapeutic exercise;Manual techniques;Energy conservation;DME Instruction;Balance training   PT Next Visit Plan Navigation around corners and obstacles. Continue supine towel roll stretches for reduction of thoracic kyphosis, tightness in pecs, combined with chin tucks and BUE overhead movements to develop t-spine extension and active ROM. Focus on more L glute med/max strengthening to improve standing SLS, and R step length during gait cycle. PRN manual to L glut med/max. Continue reciprocal tasks with metronome. Sit to stand training. Nustep.    PT Home Exercise Plan No Changes.    Consulted and Agree with Plan of Care Patient          G-Codes - Nov 12, 2014 1643    Functional Assessment Tool Used Skilled clinical observation and assessment based on functional mobility, posture, gait impairments, strength, movement quality, and functional activity tolerance;  also based on low back pain, reduced spinal mobility, reduced functional task performance    Functional Limitation Mobility: Walking and moving around   Mobility: Walking and Moving Around Current Status [phone]) At least 40 percent but less than 60 percent impaired, limited or restricted   Mobility: Walking and Moving Around Goal Status [phone]) At least 20 percent but less than 40 percent impaired, limited or restricted      Problem List Patient Active Problem List   Diagnosis Date Noted  . Abnormality of gait 08/12/2014  . Parkinsonism 08/12/2014  . Stroke 08/12/2014  . Muscle weakness (generalized) 02/22/2014  . Stiffness of left knee 02/22/2014  . Hamstring tightness of left lower extremity 02/22/2014  . Pain in joint, pelvic region and thigh 02/22/2014  . Difficulty walking 04/07/2013  . Osteoporosis 03/10/2013  . Anemia 03/10/2013  . Mild malnutrition 03/10/2013  . S/P left hip hemiarthroplasty 12/30/2012  . Osteoporosis with pathological fracture with delayed healing 11/25/2012  . Pedal edema 10/27/2012  . Fracture of sacrum 10/02/2012  . Stricture and  stenosis of esophagus 09/24/2011  . Hyperlipidemia 11/03/2008  . CAD, UNSPECIFIED SITE 11/03/2008     Physical Therapy Progress Note  Dates of Reporting Period: 10/13/14  to 11/10/14  Objective Reports of Subjective Statement: Patient and husband agree she is moving better however continues to experience occasional balance loss and low back stiffness, also still some problems getting up from chairs.   Objective Measurements: See above   Goal Update: See above   Plan: See above   Reason Skilled Services are Required: See above    Deniece Ree PT, DPT [place], [place], [address] Phone: [phone]   Fax:  [phone]

## 2014-11-11 ENCOUNTER — Ambulatory Visit (INDEPENDENT_AMBULATORY_CARE_PROVIDER_SITE_OTHER): Payer: Medicare Other | Admitting: Neurology

## 2014-11-11 ENCOUNTER — Telehealth: Payer: Self-pay | Admitting: Neurology

## 2014-11-11 ENCOUNTER — Encounter: Payer: Self-pay | Admitting: Neurology

## 2014-11-11 VITALS — BP 115/60 | HR 58 | Ht 66.0 in | Wt 106.5 lb

## 2014-11-11 DIAGNOSIS — M5440 Lumbago with sciatica, unspecified side: Secondary | ICD-10-CM

## 2014-11-11 DIAGNOSIS — R269 Unspecified abnormalities of gait and mobility: Secondary | ICD-10-CM | POA: Diagnosis not present

## 2014-11-11 DIAGNOSIS — I639 Cerebral infarction, unspecified: Secondary | ICD-10-CM

## 2014-11-11 DIAGNOSIS — G20 Parkinson's disease: Secondary | ICD-10-CM | POA: Diagnosis not present

## 2014-11-11 MED ORDER — CARBIDOPA-LEVODOPA 25-100 MG PO TABS: 2.0000 | ORAL_TABLET | Freq: Three times a day (TID) | ORAL | Status: DC

## 2014-11-11 MED ORDER — CELECOXIB 100 MG PO CAPS: 100.0000 mg | ORAL_CAPSULE | Freq: Two times a day (BID) | ORAL | Status: DC | PRN

## 2014-11-11 MED ORDER — RASAGILINE MESYLATE 1 MG PO TABS: 1.0000 mg | ORAL_TABLET | Freq: Every day | ORAL | Status: DC

## 2014-11-11 NOTE — Telephone Encounter (Signed)
Autumn with [facility] called stating rasagiline (AZILECT) 1 MG TABS tablet has an interaction with tramadol and Duloxetine prescribed by Dr Sallee Lange. These are level 1 interaction which is severe. She can be reached at [phone].

## 2014-11-11 NOTE — Telephone Encounter (Signed)
I called back.  Spoke with Samantha Hudson and relayed providers message.  She has noted the patient's file and will proceed with Rx as ordered.  They will call us back if anything further is needed.

## 2014-11-11 NOTE — Telephone Encounter (Signed)
Samantha Hudson: Please call pharmacy, I have gone over potential interaction, advised patient stop tramadol, low dose Cymbalta 30 mg every day, should be okay with Azilect 1 mg daily

## 2014-11-11 NOTE — Progress Notes (Signed)
Chief Complaint  Patient presents with  . Cerebrovascular Accident    MMSE 30/30 - 19 animals. She is here with her husband, Konrad Dolores.  They would like to review her MRI results.  Feels the last increase in Sinemet and PT have been helpful.  She uses a rolling walker for assistance with ambulation.      PATIENT: Samantha Hudson DOB: Aug 28, 1934  HISTORICAL  Samantha Hudson is a 80 years old right-handed Caucasian female, accompanied by her husband, a retired Software engineer, referred by her primary care physician Dr. Sallee Lange for evaluation of worsening gait difficulty, difficulty initiate gait  She had a history of right meningioma, status post resection in 2007, 2 days later, she suffered a large right MCA stroke, also with past medical history of coronary artery disease, status post stent placement, she recovered very well, was able to drive, ambulate without assistance, but with residual left hemiparesthesia, often unpleasant deep achy pain involving left side of her body,  She had osteoporosis, left hip fracture in 2014, require replacement in August 2014, since surgery, she had a great decline of her ambulatory ability, she now rely on her walker  In 2014, she also developed  loss sense of smell, REM sleep disorder, screaming out of her dreams, mild constipation, now she noticed right hand tremor, small handwriting since 2015, mild memory trouble, worsening gait difficulty, difficulty initiate walking using her right leg, tendency to lean backwards, worsening left-sided pain, especially around her left hip, left anterior thigh.  She is taking gabapentin, which has been helpful, but 400 mg 4 times a day, will make her sleepy, she is only taking 900 mg daily now, continue have excessive fatigue, daytime sleepiness, difficult to read, double vision,  We have reviewed MRI of the brain August 04 2014, demonstrate large size right MCA stroke, involving right medial, and lateral temporal lobe, no  acute lesions.  April 20 first 2016 Since her initial visit August 12 2014, because of mild parkinsonian features, I have started Sinemet 25/100 one tablet 3 times a day, she is taking it at 10, 3, and 9 PM, no significant improvement, no significant side effect, she complains mild low back pain, continue significant gait difficulty, difficulty picking up her right leg  UPDATE November 11 2014: She can move better after Sinemet dosage was increased to 25/100 mg 4 times a day, physical therapy was helpful, husband reported that she takes Sinemet 1 hour before any meal, become center of her daily activity,  She denies significant memory trouble, complains of chronic neck pain, bilateral shoulder pain, left hip pain, low back pain,  We have reviewed her MRI lumbar in May 2016, There is lumbosacral transitional anatomy. This report assumes that there are 5 lumbar type vertebral bodies. Recommend close correlation with radiographs if intervention is elected. Moderate multilevel lumbar spondylosis. Degenerative disease is most pronounced at L3-L4 potentially affecting both exiting L3 nerves. Healed sacral insufficiency fractures demonstrated on prior nuclear medicine bone scan.  REVIEW OF SYSTEMS: Full 14 system review of systems performed and notable only for Gait difficulty,  ALLERGIES: Allergies  Allergen Reactions  . Lactose Intolerance (Gi)   . Penicillins     RASH - REACTION WAS YEARS AGO    HOME MEDICATIONS: Current Outpatient Prescriptions  Medication Sig Dispense Refill  . atenolol (TENORMIN) 25 MG tablet TAKE (1) TABLET TWICE A DAY. 60 tablet 1  . atorvastatin (LIPITOR) 10 MG tablet TAKE 1 TABLET AT BEDTIME FOR CHOLESTEROL 30 tablet 2  .  calcium-vitamin D (OSCAL WITH D) 500-200 MG-UNIT per tablet Take 1 tablet by mouth.    . cholecalciferol (VITAMIN D) 400 UNITS TABS tablet Take 400 Units by mouth daily.    Marland Kitchen dipyridamole-aspirin (AGGRENOX) 200-25 MG per 12 hr capsule TAKE  (1)  CAPSULE   TWICE DAILY. 60 capsule 12  . DULoxetine (CYMBALTA) 30 MG capsule TAKE (1) CAPSULE DAILY. 30 capsule 12  . feeding supplement (ENSURE COMPLETE) LIQD Take 237 mL by mouth daily.    Marland Kitchen gabapentin (NEURONTIN) 100 MG capsule Take one in the afternoon 30 capsule 12  . gabapentin (NEURONTIN) 400 MG capsule Take 1 capsule (400 mg total) by mouth 2 (two) times daily. 60 capsule 12  . polyethylene glycol powder (GLYCOLAX/MIRALAX) powder Take 17 g by mouth 2 (two) times daily. 255 g 0  . potassium chloride (K-DUR) 10 MEQ tablet TAKE (1) TABLET TWICE A DAY. 60 tablet 12  . ranitidine (ZANTAC) 300 MG tablet TAKE (1) TABLET DAILY AS DIRECTED. 30 tablet 12  . torsemide (DEMADEX) 20 MG tablet Take one half qam 15 tablet 11  . traMADol (ULTRAM) 50 MG tablet Take 1 tablet (50 mg total) by mouth 3 (three) times daily as needed. 60 tablet 5     PAST MEDICAL HISTORY: Past Medical History  Diagnosis Date  . Hyperlipemia   . IBS (irritable bowel syndrome)   . Osteoporosis   . Fibromyalgia   . Glaucoma     HAD LASER SURGERY - NO EYE DROPS REQUIRED  . Globus hystericus   . Fibromyalgia   . Meningioma   . CAD (coronary artery disease)     HEART STENT PLACED ABOUT 2000- NO LONGER SEES CARDIOLOGIST; NO C/O OF CHEST PAINS OR SOB  . Heart murmur     MVP SINCE THE 60'S - TAKES ATENOLOL -  . Anxiety   . Swelling     BILATERAL FEET AND LEGS - ONSET OF SWELLING APRIL 2014  . GERD (gastroesophageal reflux disease)   . Arthritis   . Fracture   . CVA (cerebral vascular accident)     STROKE 7 YRS AGO - DAY AFTER BRAIN SURGERY TO REMOVE BENIGN MENINGIOMA - HAS LEFT SIDED WEAKNESS AND A LITTLE NUMBNESS  . Pain     "EXCRUCIATING PAIN" LEFT HIP - HAS A FRACTURE - IS CONFINED TO W/C AT HOME - NO WEIGHT BEARING LEFT HIP.    PAST SURGICAL HISTORY: Past Surgical History  Procedure Laterality Date  . Brain surgery      tumor removed  . Tonsillectomy    . Partial hysterectomy    . Glaucoma repair    . Cataract  extraction    . Kidney surgery      left - HX ENLARGED LEFT KIDNEY ON XRAY - SURGERY WAS DONE ON URETER   . Colonoscopy    . Coronary angioplasty    . Hip arthroplasty Left 12/29/2012    Procedure: LEFT HIP HEMI ARTHROPLASTY ;  Surgeon: Mauri Pole, MD;  Location: [facility];  Service: Orthopedics;  Laterality: Left;    FAMILY HISTORY: Family History  Problem Relation Age of Onset  . Liver disease Father   . Diabetes Father   . Coronary artery disease Mother     deseased  . Heart attack Mother   . Hyperlipidemia Mother   . Breast cancer Sister     x 2  . Cancer Sister     breast    SOCIAL HISTORY:  History   Social History  .  Marital Status: Married    Spouse Name: N/A  . Number of Children: 2  . Years of Education: 16   Occupational History  . retired Pharmacist, hospital    Social History Main Topics  . Smoking status: Never Smoker   . Smokeless tobacco: Never Used  . Alcohol Use: No  . Drug Use: No  . Sexual Activity: Not on file   Other Topics Concern  . Not on file   Social History Narrative   Lives at home with husband.   Right- handed   Occasional caffeine use.     PHYSICAL EXAM   Filed Vitals:   11/11/14 1346  BP: 115/60  Pulse: 58  Height: 5\' 6"  (1.676 m)  Weight: 106 lb 8 oz (48.308 kg)    Not recorded      Body mass index is 17.2 kg/(m^2).  PHYSICAL EXAMNIATION:  Gen: NAD, conversant, well nourised, obese, well groomed                     Cardiovascular: Regular rate rhythm, no peripheral edema, warm, nontender. Eyes: Conjunctivae clear without exudates or hemorrhage Neck: Supple, no carotid bruise. Pulmonary: Clear to auscultation bilaterally   NEUROLOGICAL EXAM:  MENTAL STATUS: Speech:    Speech is normal; fluent and spontaneous with normal comprehension.  Cognition:    T mild mask face, needing mental status examination is 30 out of 30, animal naming is 17  CRANIAL NERVES: CN II: Visual fields are full to confrontation. Fundoscopic  exam is normal with sharp discs and no vascular changes. Venous pulsations are present bilaterally. Pupils are 4 mm and briskly reactive to light. Visual acuity is 20/20 bilaterally. CN III, IV, VI: extraocular movement are normal. No ptosis. CN V: Facial sensation is intact to pinprick in all 3 divisions bilaterally. Corneal responses are intact.  CN VII: Face is symmetric with normal eye closure and smile. CN VIII: Hearing is normal to rubbing fingers CN IX, X: Palate elevates symmetrically. Phonation is normal. CN XI: Head turning and shoulder shrug are intact CN XII: Tongue is midline with normal movements and no atrophy.  MOTOR: Left arm pronation drift, mild spasticity, fixation on rapid rotating movement, mild left upper extremity proximal and distal muscle weakness, bradykinesia, worsening with reinforcement maneuver, mild/moderate nuchal Rigidity,  REFLEXES: Reflexes are 2+ and symmetric at the biceps, triceps, knees, and ankles. Plantar responses are flexor.  SENSORY: Decreased light touch, pinprick and vibratory sensation at left side of her body,    COORDINATION: Rapid alternating movements and fine finger movements are intact. There is no dysmetria on finger-to-nose and heel-knee-shin. There are no abnormal or extraneous movements.   GAIT/STANCE: Needs assistance to get up from seated position,, tendency to initiate gait, stiff, small stride,Enblock turning, dragging her left leg   DIAGNOSTIC DATA (LABS, IMAGING, TESTING) - I reviewed patient records, labs, notes, testing and imaging myself where available.  Lab Results  Component Value Date   WBC 5.1 08/02/2014   HGB 12.5 08/02/2014   HCT 38.4 08/02/2014   MCV 91 08/02/2014   PLT 205 08/02/2014      Component Value Date/Time   NA 143 08/02/2014 1112   NA 140 04/02/2014 1301   K 4.7 08/02/2014 1112   CL 99 08/02/2014 1112   CO2 26 08/02/2014 1112   GLUCOSE 87 08/02/2014 1112   GLUCOSE 82 04/02/2014 1301   BUN  31* 08/02/2014 1112   BUN 31* 04/02/2014 1301   CREATININE 1.01* 08/02/2014 1112  CREATININE 0.82 04/02/2014 1301   CALCIUM 10.2 08/02/2014 1112   PROT 6.9 08/02/2014 1112   PROT 7.1 04/02/2014 1301   ALBUMIN 4.2 04/02/2014 1301   AST 27 08/02/2014 1112   ALT 21 08/02/2014 1112   ALKPHOS 48 08/02/2014 1112   BILITOT 0.4 08/02/2014 1112   BILITOT 0.4 04/02/2014 1301   GFRNONAA 54* 08/02/2014 1112   GFRAA 62 08/02/2014 1112   Lab Results  Component Value Date   CHOL 178 08/02/2014   HDL 84 08/02/2014   LDLCALC 83 08/02/2014   TRIG 53 08/02/2014   CHOLHDL 2.1 08/02/2014   No results found for: HGBA1C Lab Results  Component Value Date   VITAMINB12 1186* 01/01/2014   Lab Results  Component Value Date   TSH 3.316 12/29/2013    ASSESSMENT AND PLAN  Samantha Hudson is a 80 y.o. female with past medical history of right craniotomy for right-sided meningioma, develop post surgical right MCA stroke, since 2014, she had worsening gait difficulty, some parkinsonian features, moderate right ankle dorsiflexion weakness, dragging her right leg  1, Parkinson's disease, responding to titrating dose of Sinemet, increase Sinemet to 25/100 mg 2 tablet 3 times a day, add on Azilect 1 mg daily, stop tramadol 2, Right MCA stroke, continue to address her vascular risk factor, continue Aggrenox twice a day 3. Gait difficulty, multifactorial, a component of lumbar radiculopathy, low back pain, left side weakness due to previous right MCA stroke, Parkinson's disease 4, I encouraged her moderate exercise    Marcial Pacas, M.D. Ph.D.  [facility] [address], [place], [place] [address] Ph: [phone] Fax: [phone]

## 2014-11-11 NOTE — Patient Instructions (Signed)
Stop Tramadol, start Azilect 1mg  qday

## 2014-11-12 ENCOUNTER — Ambulatory Visit (HOSPITAL_COMMUNITY): Payer: Medicare Other | Admitting: Physical Therapy

## 2014-11-12 DIAGNOSIS — R2689 Other abnormalities of gait and mobility: Secondary | ICD-10-CM

## 2014-11-12 DIAGNOSIS — G20 Parkinson's disease: Secondary | ICD-10-CM

## 2014-11-12 DIAGNOSIS — M6281 Muscle weakness (generalized): Secondary | ICD-10-CM

## 2014-11-12 DIAGNOSIS — Z7409 Other reduced mobility: Secondary | ICD-10-CM

## 2014-11-12 DIAGNOSIS — R531 Weakness: Secondary | ICD-10-CM

## 2014-11-12 DIAGNOSIS — Z9181 History of falling: Secondary | ICD-10-CM

## 2014-11-12 DIAGNOSIS — R293 Abnormal posture: Secondary | ICD-10-CM

## 2014-11-12 DIAGNOSIS — M545 Low back pain: Secondary | ICD-10-CM

## 2014-11-12 DIAGNOSIS — R269 Unspecified abnormalities of gait and mobility: Secondary | ICD-10-CM

## 2014-11-12 NOTE — Therapy (Signed)
[place], [place], [address] Phone: [phone]   Fax:  [phone]  Physical Therapy Treatment  Patient Details  Name: Samantha Hudson MRN: [id number] Date of Birth: 03-14-1935 Referring Provider:  Kathyrn Drown, MD  Encounter Date: 11/12/2014      PT End of Session - 11/12/14 1343    Visit Number 20   Number of Visits 30   Date for PT Re-Evaluation 12/08/14   Authorization Type Medicare   Authorization Time Period 10/13/14 to 12/13/14; G-code done 19th session    Authorization - Visit Number 20   Authorization - Number of Visits 30   PT Start Time 1303   PT Stop Time 1350   PT Time Calculation (min) 47 min   Equipment Utilized During Treatment Gait belt   Activity Tolerance Patient tolerated treatment well   Behavior During Therapy [facility] for tasks assessed/performed      Past Medical History  Diagnosis Date  . Hyperlipemia   . IBS (irritable bowel syndrome)   . Osteoporosis   . Fibromyalgia   . Glaucoma     HAD LASER SURGERY - NO EYE DROPS REQUIRED  . Globus hystericus   . Fibromyalgia   . Meningioma   . CAD (coronary artery disease)     HEART STENT PLACED ABOUT 2000- NO LONGER SEES CARDIOLOGIST; NO C/O OF CHEST PAINS OR SOB  . Heart murmur     MVP SINCE THE 60'S - TAKES ATENOLOL -  . Anxiety   . Swelling     BILATERAL FEET AND LEGS - ONSET OF SWELLING APRIL 2014  . GERD (gastroesophageal reflux disease)   . Arthritis   . Fracture   . CVA (cerebral vascular accident)     STROKE 7 YRS AGO - DAY AFTER BRAIN SURGERY TO REMOVE BENIGN MENINGIOMA - HAS LEFT SIDED WEAKNESS AND A LITTLE NUMBNESS  . Pain     "EXCRUCIATING PAIN" LEFT HIP - HAS A FRACTURE - IS CONFINED TO W/C AT HOME - NO WEIGHT BEARING LEFT HIP.    Past Surgical History  Procedure Laterality Date  . Brain surgery      tumor removed  . Tonsillectomy    . Partial hysterectomy    . Glaucoma repair    . Cataract extraction    .  Kidney surgery      left - HX ENLARGED LEFT KIDNEY ON XRAY - SURGERY WAS DONE ON URETER   . Colonoscopy    . Coronary angioplasty    . Hip arthroplasty Left 12/29/2012    Procedure: LEFT HIP HEMI ARTHROPLASTY ;  Surgeon: Mauri Pole, MD;  Location: [facility];  Service: Orthopedics;  Laterality: Left;    There were no vitals filed for this visit.  Visit Diagnosis:  Parkinsonism  Generalized weakness  Muscle weakness (generalized)  Abnormality of gait  Low back pain without sciatica, unspecified back pain laterality  Impaired functional mobility, balance, gait, and endurance  Decreased spinal mobility  Poor posture  Risk for falls                       [facility] Adult PT Treatment/Exercise - 11/12/14 0001    Ambulation/Gait   Ambulation/Gait Yes   Ambulation/Gait Assistance 4: Min guard   Ambulation Distance (Feet) --  3x226   Assistive device None   Gait Pattern Step-through pattern;Decreased step length - right;Decreased weight shift to left;Decreased trunk rotation;Trunk flexed;Narrow base of support  Ambulation Surface Level   Gait Comments Gait without metronome, focus on sequencing and maintaining good posture while maintaining comfortable gait speed    Lumbar Exercises: Standing   Other Standing Lumbar Exercises Sun salutations 1x10 with facilitation and cues for proper form    Other Standing Lumbar Exercises Hip excursions sagittal plane only 1x15   Lumbar Exercises: Supine   Other Supine Lumbar Exercises Full body stretch in supine 5 minutes on table with focus on ABD/ER of B UEs, cervical extension, and hip mobility within hip precautions   Other Supine Lumbar Exercises Cone rotations in sitting with manual facilitation for form; also manual facilitation of trunk rotation in sitting and manual posterior shoulder rolls    Knee/Hip Exercises: Aerobic   Stationary Bike Nustep seat 9, level 4 at hills setting 3 for 10 minutes                  PT Education - 11/12/14 1343    Education provided No          PT Short Term Goals - 11/10/14 1341    PT SHORT TERM GOAL #1   Title Patient will demonstrate improved bilateral lower extremity strength to at least 4-/5 in order to improve overall stability and functional mobility skills    Time 3   Period Weeks   Status Achieved   PT SHORT TERM GOAL #2   Title Patient will demonstrate improved gait mechanics and will be able to ambulate at least 436ft with rolling walker and cues for improved mechanics and posture no more than 40% of the time, minimal fatigue   Time 3   Period Weeks   Status Achieved   PT SHORT TERM GOAL #3   Title Patient will demonstrate the ability to perform supine <-->sit with supervision and cues for sequencing no more than 20% of the time   Time 3   Period Weeks   Status Achieved   PT SHORT TERM GOAL #4   Title Patient will be able to perform sit to stand transfer with supervision, cues for proper form no more than 20% of the time and no posterior loss of balance    Baseline 6/22- able to do from some surfaces, other times still has difficulty    Time 3   Period Weeks   Status On-going   PT SHORT TERM GOAL #5   Title Patient and her spouse will be independent in correctly and consistently performing appropriate HEP with emphasis on large, high quality movement patterns    Time 3   Period Weeks   Status Achieved   PT SHORT TERM GOAL #6   Title Patient will experience no more than 3/10 pain in her low back and L hip during all weight bearing tasks and activities    Time 3   Period Weeks   Status On-going   PT SHORT TERM GOAL #7   Title Patient will demonstrate improved spinal mobilty by at least 10 degrees in order to reduce overall stiffness and reduce pain with mobiltiy    Time 3   Period Weeks   Status On-going   PT SHORT TERM GOAL #8   Title Patient will not experience no more than 3/10 pain in her back and L hip during gait distance of at least  565ft with no assistive device    Time 3   Period Weeks   Status On-going           PT Long Term Goals -  11/10/14 1345    PT LONG TERM GOAL #1   Title Patient will be able to perform TUG assessment in 15 seconds or less on 3/3 attempts, good safety awareness, and good balance throughout test    Baseline 6/22- DNT today    Time 6   Period Weeks   Status On-going   PT LONG TERM GOAL #2   Title Patient will be able to ambulate at least 1081ft with no assistive device, good posture, improved gait mechanics with equal step lengths, good clearance of floor, improved weight bearing through L leg, and increased stance time bilaterally with no posterior loss of balance and minimal fatigue    Time 6   Period Weeks   Status On-going   PT LONG TERM GOAL #3   Title Patient will be able to perform rolling, supine to sit, and sit to stand transfer with Mod(I) and good safety awareness, good posture, and high quality movements throughout, minimal fall risk    Time 6   Period Weeks   Status On-going   PT LONG TERM GOAL #4   Title Patient will as a whole demonstrate the ability to perform large, high quality movement patterns on a consistent basis with minimal posterior loss of balance and minimal fall risk in order to maximize her quality of life and ease of motion   Time 4   Period Weeks   Status On-going   PT LONG TERM GOAL #5   Title Patient and her spouse to be educated regarding BIG movement program/school of thought for people with Parkinsons; also to be educated regarding any available support/activity groups in the local area    Time 6   Period Weeks   Status On-going   PT LONG TERM GOAL #6   Title Patient will experience no more than 1/10 pain in her low back during all functional upright weight bearing tasks    Time 6   Period Weeks   Status On-going   PT LONG TERM GOAL #7   Title Patient will demonstrate the abilty to ambulate unlimited distances with no assistive device, minimal  fatigue, pain no more than 1/10 in her low back    Time 6   Period Weeks   Status On-going   PT LONG TERM GOAL #8   Title Patient will more easily be able to independently perform functional activities such as putting on her shoes and socks without assistance from her husband    Time 6   Period Weeks   Status On-going               Plan - 11/12/14 1344    Clinical Impression Statement Focus on posture and mobility today with manual facilitation and assist from proper form from PT today. Patient showed progressive improvement in posture, improvement in back discomfort, and ambulation mechanics after each facilitatory technique today. Encouraged patient to attempt to find gait pace that is comfortable without being too slow or fast, no metronome today.    Pt will benefit from skilled therapeutic intervention in order to improve on the following deficits Abnormal gait;Decreased coordination;Difficulty walking;Impaired flexibility;Improper body mechanics;Postural dysfunction;Decreased safety awareness;Decreased endurance;Decreased activity tolerance;Decreased balance;Decreased mobility;Decreased strength;Other (comment);Hypomobility;Pain;Decreased range of motion   Rehab Potential Good   PT Frequency 2x / week   PT Duration 4 weeks   PT Treatment/Interventions ADLs/Self Care Home Management;Gait training;Neuromuscular re-education;Visual/perceptual remediation/compensation;Stair training;Biofeedback;Functional mobility training;Patient/family education;Passive range of motion;Therapeutic activities;Therapeutic exercise;Manual techniques;Energy conservation;DME Instruction;Balance training   PT Next Visit Plan Navigation around  corners and obstacles. Continue supine towel roll stretches for reduction of thoracic kyphosis, tightness in pecs, combined with chin tucks and BUE overhead movements to develop t-spine extension and active ROM. Focus on more L glute med/max strengthening to improve  standing SLS, and R step length during gait cycle. PRN manual to L glut med/max. Continue reciprocal tasks with metronome. Sit to stand training. Nustep.    PT Home Exercise Plan No Changes.    Consulted and Agree with Plan of Care Patient        Problem List Patient Active Problem List   Diagnosis Date Noted  . Abnormality of gait 08/12/2014  . Parkinsonism 08/12/2014  . Stroke 08/12/2014  . Muscle weakness (generalized) 02/22/2014  . Stiffness of left knee 02/22/2014  . Hamstring tightness of left lower extremity 02/22/2014  . Pain in joint, pelvic region and thigh 02/22/2014  . Difficulty walking 04/07/2013  . Osteoporosis 03/10/2013  . Anemia 03/10/2013  . Mild malnutrition 03/10/2013  . S/P left hip hemiarthroplasty 12/30/2012  . Osteoporosis with pathological fracture with delayed healing 11/25/2012  . Pedal edema 10/27/2012  . Fracture of sacrum 10/02/2012  . Stricture and stenosis of esophagus 09/24/2011  . Hyperlipidemia 11/03/2008  . CAD, UNSPECIFIED SITE 11/03/2008    Deniece Ree PT, DPT [phone]  [place] [address] [place], [place], [address] Phone: [phone]   Fax:  [phone]

## 2014-11-15 ENCOUNTER — Ambulatory Visit (HOSPITAL_COMMUNITY): Payer: Medicare Other | Admitting: Physical Therapy

## 2014-11-15 DIAGNOSIS — G20 Parkinson's disease: Secondary | ICD-10-CM | POA: Diagnosis not present

## 2014-11-15 DIAGNOSIS — M545 Low back pain: Secondary | ICD-10-CM

## 2014-11-15 DIAGNOSIS — M6281 Muscle weakness (generalized): Secondary | ICD-10-CM

## 2014-11-15 DIAGNOSIS — M25662 Stiffness of left knee, not elsewhere classified: Secondary | ICD-10-CM

## 2014-11-15 DIAGNOSIS — R269 Unspecified abnormalities of gait and mobility: Secondary | ICD-10-CM

## 2014-11-15 DIAGNOSIS — Z7409 Other reduced mobility: Secondary | ICD-10-CM

## 2014-11-15 DIAGNOSIS — M25552 Pain in left hip: Secondary | ICD-10-CM

## 2014-11-15 DIAGNOSIS — R531 Weakness: Secondary | ICD-10-CM

## 2014-11-15 DIAGNOSIS — Z9181 History of falling: Secondary | ICD-10-CM

## 2014-11-15 DIAGNOSIS — R2689 Other abnormalities of gait and mobility: Secondary | ICD-10-CM

## 2014-11-15 DIAGNOSIS — M6289 Other specified disorders of muscle: Secondary | ICD-10-CM

## 2014-11-15 DIAGNOSIS — R293 Abnormal posture: Secondary | ICD-10-CM

## 2014-11-15 NOTE — Therapy (Signed)
[place], [place], [address] Phone: [phone]   Fax:  [phone]  Physical Therapy Treatment  Patient Details  Name: Samantha Hudson MRN: [id number] Date of Birth: Mar 06, 1935 Referring Provider:  Marcial Pacas, MD  Encounter Date: 11/15/2014      PT End of Session - 11/15/14 1643    Visit Number 21   Number of Visits 30   Date for PT Re-Evaluation 12/08/14   Authorization Type Medicare   Authorization Time Period 10/13/14 to 12/13/14; G-code done 19th session    Authorization - Visit Number 21   Authorization - Number of Visits 30   PT Start Time 1600   PT Stop Time 1650   PT Time Calculation (min) 50 min   Equipment Utilized During Treatment Gait belt   Activity Tolerance Patient tolerated treatment well   Behavior During Therapy [facility] for tasks assessed/performed      Past Medical History  Diagnosis Date  . Hyperlipemia   . IBS (irritable bowel syndrome)   . Osteoporosis   . Fibromyalgia   . Glaucoma     HAD LASER SURGERY - NO EYE DROPS REQUIRED  . Globus hystericus   . Fibromyalgia   . Meningioma   . CAD (coronary artery disease)     HEART STENT PLACED ABOUT 2000- NO LONGER SEES CARDIOLOGIST; NO C/O OF CHEST PAINS OR SOB  . Heart murmur     MVP SINCE THE 60'S - TAKES ATENOLOL -  . Anxiety   . Swelling     BILATERAL FEET AND LEGS - ONSET OF SWELLING APRIL 2014  . GERD (gastroesophageal reflux disease)   . Arthritis   . Fracture   . CVA (cerebral vascular accident)     STROKE 7 YRS AGO - DAY AFTER BRAIN SURGERY TO REMOVE BENIGN MENINGIOMA - HAS LEFT SIDED WEAKNESS AND A LITTLE NUMBNESS  . Pain     "EXCRUCIATING PAIN" LEFT HIP - HAS A FRACTURE - IS CONFINED TO W/C AT HOME - NO WEIGHT BEARING LEFT HIP.    Past Surgical History  Procedure Laterality Date  . Brain surgery      tumor removed  . Tonsillectomy    . Partial hysterectomy    . Glaucoma repair    . Cataract extraction    . Kidney  surgery      left - HX ENLARGED LEFT KIDNEY ON XRAY - SURGERY WAS DONE ON URETER   . Colonoscopy    . Coronary angioplasty    . Hip arthroplasty Left 12/29/2012    Procedure: LEFT HIP HEMI ARTHROPLASTY ;  Surgeon: Mauri Pole, MD;  Location: [facility];  Service: Orthopedics;  Laterality: Left;    There were no vitals filed for this visit.  Visit Diagnosis:  Parkinsonism  Generalized weakness  Muscle weakness (generalized)  Abnormality of gait  Low back pain without sciatica, unspecified back pain laterality  Impaired functional mobility, balance, gait, and endurance  Decreased spinal mobility  Poor posture  Risk for falls  Stiffness of left knee  Hamstring tightness of left lower extremity  Pain in joint, pelvic region and thigh, left      Subjective Assessment - 11/15/14 1757    Subjective Pt states she is walking at [employer] high school parking lot and believes this is helping her alot.     Currently in Pain? No/denies  [facility] Adult PT Treatment/Exercise - 11/15/14 1601    Ambulation/Gait   Ambulation/Gait Yes   Ambulation/Gait Assistance 4: Min guard   Ambulation Distance (Feet) 452 Feet  3X226 feet   Assistive device None   Gait Pattern Step-through pattern;Decreased step length - right;Decreased weight shift to left;Decreased trunk rotation;Trunk flexed;Narrow base of support   Ambulation Surface Level   Gait Comments Gait without metronome, focus on sequencing and maintaining good posture while maintaining comfortable gait speed    Neck Exercises: Seated   Other Seated Exercise 3D thoracic excursion with UE movements 5x each   Neck Exercises: Supine   Neck Retraction 15 reps   Shoulder Flexion 20 reps   Shoulder Flexion Weights (lbs) green physioball   Lumbar Exercises: Seated   Sit to Stand 10 reps   Sit to Stand Limitations no UE's edge of mat   Other Seated Lumbar Exercises Seated Rows: Green TB    Lumbar Exercises: Supine   Other Supine Lumbar Exercises Full body stretch in supine 5 minutes on table with focus on ABD/ER of B UEs, cervical extension, and hip mobility within hip precautions   Other Supine Lumbar Exercises rotations in sitting with manual facilitation for form; also manual facilitation of trunk rotation in sitting and manual posterior shoulder rolls    Knee/Hip Exercises: Aerobic   Stationary Bike Nustep seat 7 UE 6, level 4 at hills setting 3 for 10 minutes                   PT Short Term Goals - 11/10/14 1341    PT SHORT TERM GOAL #1   Title Patient will demonstrate improved bilateral lower extremity strength to at least 4-/5 in order to improve overall stability and functional mobility skills    Time 3   Period Weeks   Status Achieved   PT SHORT TERM GOAL #2   Title Patient will demonstrate improved gait mechanics and will be able to ambulate at least 475ft with rolling walker and cues for improved mechanics and posture no more than 40% of the time, minimal fatigue   Time 3   Period Weeks   Status Achieved   PT SHORT TERM GOAL #3   Title Patient will demonstrate the ability to perform supine <-->sit with supervision and cues for sequencing no more than 20% of the time   Time 3   Period Weeks   Status Achieved   PT SHORT TERM GOAL #4   Title Patient will be able to perform sit to stand transfer with supervision, cues for proper form no more than 20% of the time and no posterior loss of balance    Baseline 6/22- able to do from some surfaces, other times still has difficulty    Time 3   Period Weeks   Status On-going   PT SHORT TERM GOAL #5   Title Patient and her spouse will be independent in correctly and consistently performing appropriate HEP with emphasis on large, high quality movement patterns    Time 3   Period Weeks   Status Achieved   PT SHORT TERM GOAL #6   Title Patient will experience no more than 3/10 pain in her low back and L hip  during all weight bearing tasks and activities    Time 3   Period Weeks   Status On-going   PT SHORT TERM GOAL #7   Title Patient will demonstrate improved spinal mobilty by at least 10 degrees in order to reduce  overall stiffness and reduce pain with mobiltiy    Time 3   Period Weeks   Status On-going   PT SHORT TERM GOAL #8   Title Patient will not experience no more than 3/10 pain in her back and L hip during gait distance of at least 592ft with no assistive device    Time 3   Period Weeks   Status On-going           PT Long Term Goals - 11/10/14 1345    PT LONG TERM GOAL #1   Title Patient will be able to perform TUG assessment in 15 seconds or less on 3/3 attempts, good safety awareness, and good balance throughout test    Baseline 6/22- DNT today    Time 6   Period Weeks   Status On-going   PT LONG TERM GOAL #2   Title Patient will be able to ambulate at least 1075ft with no assistive device, good posture, improved gait mechanics with equal step lengths, good clearance of floor, improved weight bearing through L leg, and increased stance time bilaterally with no posterior loss of balance and minimal fatigue    Time 6   Period Weeks   Status On-going   PT LONG TERM GOAL #3   Title Patient will be able to perform rolling, supine to sit, and sit to stand transfer with Mod(I) and good safety awareness, good posture, and high quality movements throughout, minimal fall risk    Time 6   Period Weeks   Status On-going   PT LONG TERM GOAL #4   Title Patient will as a whole demonstrate the ability to perform large, high quality movement patterns on a consistent basis with minimal posterior loss of balance and minimal fall risk in order to maximize her quality of life and ease of motion   Time 4   Period Weeks   Status On-going   PT LONG TERM GOAL #5   Title Patient and her spouse to be educated regarding BIG movement program/school of thought for people with Parkinsons; also to  be educated regarding any available support/activity groups in the local area    Time 6   Period Weeks   Status On-going   PT LONG TERM GOAL #6   Title Patient will experience no more than 1/10 pain in her low back during all functional upright weight bearing tasks    Time 6   Period Weeks   Status On-going   PT LONG TERM GOAL #7   Title Patient will demonstrate the abilty to ambulate unlimited distances with no assistive device, minimal fatigue, pain no more than 1/10 in her low back    Time 6   Period Weeks   Status On-going   PT LONG TERM GOAL #8   Title Patient will more easily be able to independently perform functional activities such as putting on her shoes and socks without assistance from her husband    Time 6   Period Weeks   Status On-going               Plan - 11/15/14 1754    Clinical Impression Statement contiued focus on posture, mobility and stability with activity.  Began with full body stretch prior to progression of therex.  Added sit to stand wtih major limitation of tendency to go into extension posture instead of getting into BOS.   Pt with increased trunk rotation in sitting and able to complete 452 feet without rest or assistance  from therapist or AD.  Pt is progressing well.   PT Next Visit Plan Navigation around corners and obstacles. Continue supine towel roll stretches for reduction of thoracic kyphosis, tightness in pecs, combined with chin tucks and BUE overhead movements to develop t-spine extension and active ROM. Focus on more L glute med/max strengthening to improve standing SLS, and R step length during gait cycle. PRN manual to L glut med/max. Continue reciprocal tasks with metronome. Sit to stand training. Nustep.    PT Home Exercise Plan No Changes.    Consulted and Agree with Plan of Care Patient        Problem List Patient Active Problem List   Diagnosis Date Noted  . Abnormality of gait 08/12/2014  . Parkinsonism 08/12/2014  .  Stroke 08/12/2014  . Muscle weakness (generalized) 02/22/2014  . Stiffness of left knee 02/22/2014  . Hamstring tightness of left lower extremity 02/22/2014  . Pain in joint, pelvic region and thigh 02/22/2014  . Difficulty walking 04/07/2013  . Osteoporosis 03/10/2013  . Anemia 03/10/2013  . Mild malnutrition 03/10/2013  . S/P left hip hemiarthroplasty 12/30/2012  . Osteoporosis with pathological fracture with delayed healing 11/25/2012  . Pedal edema 10/27/2012  . Fracture of sacrum 10/02/2012  . Stricture and stenosis of esophagus 09/24/2011  . Hyperlipidemia 11/03/2008  . CAD, UNSPECIFIED SITE 11/03/2008    Teena Irani, PTA/CLT [id number]  11/15/2014, 6:03 PM  [facility] [address] [place], [place], [address] Phone: [phone]   Fax:  [phone]

## 2014-11-17 ENCOUNTER — Ambulatory Visit (HOSPITAL_COMMUNITY): Payer: Medicare Other | Admitting: Physical Therapy

## 2014-11-17 DIAGNOSIS — G20 Parkinson's disease: Secondary | ICD-10-CM

## 2014-11-17 DIAGNOSIS — R269 Unspecified abnormalities of gait and mobility: Secondary | ICD-10-CM

## 2014-11-17 DIAGNOSIS — Z9181 History of falling: Secondary | ICD-10-CM

## 2014-11-17 DIAGNOSIS — M545 Low back pain: Secondary | ICD-10-CM

## 2014-11-17 DIAGNOSIS — Z7409 Other reduced mobility: Secondary | ICD-10-CM

## 2014-11-17 DIAGNOSIS — R2689 Other abnormalities of gait and mobility: Secondary | ICD-10-CM

## 2014-11-17 DIAGNOSIS — R531 Weakness: Secondary | ICD-10-CM

## 2014-11-17 DIAGNOSIS — M6281 Muscle weakness (generalized): Secondary | ICD-10-CM

## 2014-11-17 DIAGNOSIS — R293 Abnormal posture: Secondary | ICD-10-CM

## 2014-11-17 NOTE — Therapy (Signed)
[place], [place], [address] Phone: [phone]   Fax:  [phone]  Physical Therapy Treatment  Patient Details  Name: Samantha Hudson MRN: [id number] Date of Birth: 1934/08/12 Referring Provider:  Marcial Pacas, MD  Encounter Date: 11/17/2014      PT End of Session - 11/17/14 1424    Visit Number 22   Number of Visits 30   Date for PT Re-Evaluation 12/08/14   Authorization Type Medicare   Authorization Time Period 10/13/14 to 12/13/14; G-code done 19th session    Authorization - Visit Number 22   Authorization - Number of Visits 30   PT Start Time 1194   PT Stop Time 1430   PT Time Calculation (min) 42 min   Equipment Utilized During Treatment Gait belt   Activity Tolerance Patient tolerated treatment well   Behavior During Therapy [facility] for tasks assessed/performed      Past Medical History  Diagnosis Date  . Hyperlipemia   . IBS (irritable bowel syndrome)   . Osteoporosis   . Fibromyalgia   . Glaucoma     HAD LASER SURGERY - NO EYE DROPS REQUIRED  . Globus hystericus   . Fibromyalgia   . Meningioma   . CAD (coronary artery disease)     HEART STENT PLACED ABOUT 2000- NO LONGER SEES CARDIOLOGIST; NO C/O OF CHEST PAINS OR SOB  . Heart murmur     MVP SINCE THE 60'S - TAKES ATENOLOL -  . Anxiety   . Swelling     BILATERAL FEET AND LEGS - ONSET OF SWELLING APRIL 2014  . GERD (gastroesophageal reflux disease)   . Arthritis   . Fracture   . CVA (cerebral vascular accident)     STROKE 7 YRS AGO - DAY AFTER BRAIN SURGERY TO REMOVE BENIGN MENINGIOMA - HAS LEFT SIDED WEAKNESS AND A LITTLE NUMBNESS  . Pain     "EXCRUCIATING PAIN" LEFT HIP - HAS A FRACTURE - IS CONFINED TO W/C AT HOME - NO WEIGHT BEARING LEFT HIP.    Past Surgical History  Procedure Laterality Date  . Brain surgery      tumor removed  . Tonsillectomy    . Partial hysterectomy    . Glaucoma repair    . Cataract extraction    . Kidney  surgery      left - HX ENLARGED LEFT KIDNEY ON XRAY - SURGERY WAS DONE ON URETER   . Colonoscopy    . Coronary angioplasty    . Hip arthroplasty Left 12/29/2012    Procedure: LEFT HIP HEMI ARTHROPLASTY ;  Surgeon: Mauri Pole, MD;  Location: [facility];  Service: Orthopedics;  Laterality: Left;    There were no vitals filed for this visit.  Visit Diagnosis:  Parkinsonism  Generalized weakness  Muscle weakness (generalized)  Abnormality of gait  Low back pain without sciatica, unspecified back pain laterality  Impaired functional mobility, balance, gait, and endurance  Decreased spinal mobility  Risk for falls  Poor posture      Subjective Assessment - 11/17/14 1355    Subjective Patient continues to state that walking at highschool in [place] and believes that this is really helping her. Reports she bases it on when it is cool, able to go around 15-20 minutes right now.    Pertinent History Diagnosed with Parkinsons around 30 days ago; also started taking Sinemet recently. Patient had some trouble getting over posterior hip surgery she had in 2014,  symptoms pointed MD to diagnose new onset Parkinsons. 5/25- patient has recently started to experience new onset low back pain, referral received from MD and evaluation completed with new updates to program added in today.    Currently in Pain? No/denies  just stiffness                         [facility] Adult PT Treatment/Exercise - 11/17/14 0001    Ambulation/Gait   Ambulation/Gait Yes   Ambulation/Gait Assistance 4: Min guard   Ambulation Distance (Feet) --  255ftx3   Assistive device None   Gait Pattern Step-through pattern;Decreased step length - right;Decreased weight shift to left;Decreased trunk rotation;Trunk flexed;Narrow base of support   Ambulation Surface Level   Gait Comments Gait without metronome, focus on sequencing and maintaining good posture while maintaining comfortable gait speed    Lumbar  Exercises: Standing   Other Standing Lumbar Exercises SLS 4x10 seconds each    Lumbar Exercises: Seated   Sit to Stand Other (comment)  without UEs and with silding board under heels    Sit to Stand Limitations no UEs, elevated mat table, heels on sliding board    Lumbar Exercises: Supine   Other Supine Lumbar Exercises Full body stretch in supine 5 minutes on table with focus on ABD/ER of B UEs, cervical extension, and hip mobility within hip precautions   Other Supine Lumbar Exercises cone rotations in sitting with manual facilitation for form   Knee/Hip Exercises: Aerobic   Stationary Bike Nustep seat 7 hillls 3 level 4 for 8 minutes                 PT Education - 11/17/14 1424    Education provided No          PT Short Term Goals - 11/10/14 1341    PT SHORT TERM GOAL #1   Title Patient will demonstrate improved bilateral lower extremity strength to at least 4-/5 in order to improve overall stability and functional mobility skills    Time 3   Period Weeks   Status Achieved   PT SHORT TERM GOAL #2   Title Patient will demonstrate improved gait mechanics and will be able to ambulate at least 477ft with rolling walker and cues for improved mechanics and posture no more than 40% of the time, minimal fatigue   Time 3   Period Weeks   Status Achieved   PT SHORT TERM GOAL #3   Title Patient will demonstrate the ability to perform supine <-->sit with supervision and cues for sequencing no more than 20% of the time   Time 3   Period Weeks   Status Achieved   PT SHORT TERM GOAL #4   Title Patient will be able to perform sit to stand transfer with supervision, cues for proper form no more than 20% of the time and no posterior loss of balance    Baseline 6/22- able to do from some surfaces, other times still has difficulty    Time 3   Period Weeks   Status On-going   PT SHORT TERM GOAL #5   Title Patient and her spouse will be independent in correctly and consistently  performing appropriate HEP with emphasis on large, high quality movement patterns    Time 3   Period Weeks   Status Achieved   PT SHORT TERM GOAL #6   Title Patient will experience no more than 3/10 pain in her low back and L  hip during all weight bearing tasks and activities    Time 3   Period Weeks   Status On-going   PT SHORT TERM GOAL #7   Title Patient will demonstrate improved spinal mobilty by at least 10 degrees in order to reduce overall stiffness and reduce pain with mobiltiy    Time 3   Period Weeks   Status On-going   PT SHORT TERM GOAL #8   Title Patient will not experience no more than 3/10 pain in her back and L hip during gait distance of at least 560ft with no assistive device    Time 3   Period Weeks   Status On-going           PT Long Term Goals - 11/10/14 1345    PT LONG TERM GOAL #1   Title Patient will be able to perform TUG assessment in 15 seconds or less on 3/3 attempts, good safety awareness, and good balance throughout test    Baseline 6/22- DNT today    Time 6   Period Weeks   Status On-going   PT LONG TERM GOAL #2   Title Patient will be able to ambulate at least 1031ft with no assistive device, good posture, improved gait mechanics with equal step lengths, good clearance of floor, improved weight bearing through L leg, and increased stance time bilaterally with no posterior loss of balance and minimal fatigue    Time 6   Period Weeks   Status On-going   PT LONG TERM GOAL #3   Title Patient will be able to perform rolling, supine to sit, and sit to stand transfer with Mod(I) and good safety awareness, good posture, and high quality movements throughout, minimal fall risk    Time 6   Period Weeks   Status On-going   PT LONG TERM GOAL #4   Title Patient will as a whole demonstrate the ability to perform large, high quality movement patterns on a consistent basis with minimal posterior loss of balance and minimal fall risk in order to maximize her  quality of life and ease of motion   Time 4   Period Weeks   Status On-going   PT LONG TERM GOAL #5   Title Patient and her spouse to be educated regarding BIG movement program/school of thought for people with Parkinsons; also to be educated regarding any available support/activity groups in the local area    Time 6   Period Weeks   Status On-going   PT LONG TERM GOAL #6   Title Patient will experience no more than 1/10 pain in her low back during all functional upright weight bearing tasks    Time 6   Period Weeks   Status On-going   PT LONG TERM GOAL #7   Title Patient will demonstrate the abilty to ambulate unlimited distances with no assistive device, minimal fatigue, pain no more than 1/10 in her low back    Time 6   Period Weeks   Status On-going   PT LONG TERM GOAL #8   Title Patient will more easily be able to independently perform functional activities such as putting on her shoes and socks without assistance from her husband    Time 6   Period Weeks   Status On-going               Plan - 11/17/14 1425    Clinical Impression Statement Continued postural stretching and exercises, focus on ambulation, also worked on sit to  stand with no UEs today. Introduced SLS today with good perforamnce and tolerance. Encourgaed patient to continue to ambualte at highschool to facilitate physical aerobic fitness and overall well-being. Some posterior unsteadiness and LOB remained today.    Pt will benefit from skilled therapeutic intervention in order to improve on the following deficits Abnormal gait;Decreased coordination;Difficulty walking;Impaired flexibility;Improper body mechanics;Postural dysfunction;Decreased safety awareness;Decreased endurance;Decreased activity tolerance;Decreased balance;Decreased mobility;Decreased strength;Other (comment);Hypomobility;Pain;Decreased range of motion   Rehab Potential Good   PT Frequency 2x / week   PT Duration 4 weeks   PT  Treatment/Interventions ADLs/Self Care Home Management;Gait training;Neuromuscular re-education;Visual/perceptual remediation/compensation;Stair training;Biofeedback;Functional mobility training;Patient/family education;Passive range of motion;Therapeutic activities;Therapeutic exercise;Manual techniques;Energy conservation;DME Instruction;Balance training   PT Next Visit Plan Navigation around corners and obstacles. Continue supine towel roll stretches for reduction of thoracic kyphosis, tightness in pecs, combined with chin tucks and BUE overhead movements to develop t-spine extension and active ROM. Focus on more L glute med/max strengthening to improve standing SLS, and R step length during gait cycle. PRN manual to L glut med/max. Continue reciprocal tasks with metronome. Sit to stand training. Nustep.    PT Home Exercise Plan No Changes.    Consulted and Agree with Plan of Care Patient        Problem List Patient Active Problem List   Diagnosis Date Noted  . Abnormality of gait 08/12/2014  . Parkinsonism 08/12/2014  . Stroke 08/12/2014  . Muscle weakness (generalized) 02/22/2014  . Stiffness of left knee 02/22/2014  . Hamstring tightness of left lower extremity 02/22/2014  . Pain in joint, pelvic region and thigh 02/22/2014  . Difficulty walking 04/07/2013  . Osteoporosis 03/10/2013  . Anemia 03/10/2013  . Mild malnutrition 03/10/2013  . S/P left hip hemiarthroplasty 12/30/2012  . Osteoporosis with pathological fracture with delayed healing 11/25/2012  . Pedal edema 10/27/2012  . Fracture of sacrum 10/02/2012  . Stricture and stenosis of esophagus 09/24/2011  . Hyperlipidemia 11/03/2008  . CAD, UNSPECIFIED SITE 11/03/2008    Deniece Ree PT, DPT [phone]  [facility] [address] [place], [place], [address] Phone: [phone]   Fax:  [phone]

## 2014-11-18 ENCOUNTER — Ambulatory Visit (HOSPITAL_COMMUNITY): Payer: Medicare Other | Admitting: Physical Therapy

## 2014-11-25 ENCOUNTER — Ambulatory Visit (HOSPITAL_COMMUNITY): Payer: Medicare Other | Attending: Neurology | Admitting: Physical Therapy

## 2014-11-25 DIAGNOSIS — Z9181 History of falling: Secondary | ICD-10-CM | POA: Insufficient documentation

## 2014-11-25 DIAGNOSIS — M25662 Stiffness of left knee, not elsewhere classified: Secondary | ICD-10-CM

## 2014-11-25 DIAGNOSIS — M25552 Pain in left hip: Secondary | ICD-10-CM | POA: Insufficient documentation

## 2014-11-25 DIAGNOSIS — R269 Unspecified abnormalities of gait and mobility: Secondary | ICD-10-CM

## 2014-11-25 DIAGNOSIS — G20 Parkinson's disease: Secondary | ICD-10-CM | POA: Diagnosis present

## 2014-11-25 DIAGNOSIS — R2689 Other abnormalities of gait and mobility: Secondary | ICD-10-CM | POA: Diagnosis present

## 2014-11-25 DIAGNOSIS — G20C Parkinsonism, unspecified: Secondary | ICD-10-CM

## 2014-11-25 DIAGNOSIS — R531 Weakness: Secondary | ICD-10-CM | POA: Diagnosis present

## 2014-11-25 DIAGNOSIS — Z7409 Other reduced mobility: Secondary | ICD-10-CM | POA: Diagnosis present

## 2014-11-25 DIAGNOSIS — M6281 Muscle weakness (generalized): Secondary | ICD-10-CM

## 2014-11-25 DIAGNOSIS — R293 Abnormal posture: Secondary | ICD-10-CM | POA: Insufficient documentation

## 2014-11-25 DIAGNOSIS — M6289 Other specified disorders of muscle: Secondary | ICD-10-CM

## 2014-11-25 DIAGNOSIS — M545 Low back pain: Secondary | ICD-10-CM

## 2014-11-25 DIAGNOSIS — M629 Disorder of muscle, unspecified: Secondary | ICD-10-CM | POA: Insufficient documentation

## 2014-11-25 NOTE — Therapy (Signed)
[place], [place], [address] Phone: [phone]   Fax:  [phone]  Physical Therapy Treatment  Patient Details  Name: Samantha Hudson MRN: [id number] Date of Birth: Oct 28, 1934 Referring Provider:  Marcial Pacas, MD  Encounter Date: 11/25/2014      PT End of Session - 11/25/14 1558    Visit Number 23   Number of Visits 30   Date for PT Re-Evaluation 12/08/14   Authorization Type Medicare   Authorization Time Period 10/13/14 to 12/13/14; G-code done 19th session    Authorization - Visit Number 23   Authorization - Number of Visits 30   PT Start Time 3005   PT Stop Time 1525   PT Time Calculation (min) 51 min   Equipment Utilized During Treatment Gait belt   Activity Tolerance Patient tolerated treatment well   Behavior During Therapy [facility] for tasks assessed/performed      Past Medical History  Diagnosis Date  . Hyperlipemia   . IBS (irritable bowel syndrome)   . Osteoporosis   . Fibromyalgia   . Glaucoma     HAD LASER SURGERY - NO EYE DROPS REQUIRED  . Globus hystericus   . Fibromyalgia   . Meningioma   . CAD (coronary artery disease)     HEART STENT PLACED ABOUT 2000- NO LONGER SEES CARDIOLOGIST; NO C/O OF CHEST PAINS OR SOB  . Heart murmur     MVP SINCE THE 60'S - TAKES ATENOLOL -  . Anxiety   . Swelling     BILATERAL FEET AND LEGS - ONSET OF SWELLING APRIL 2014  . GERD (gastroesophageal reflux disease)   . Arthritis   . Fracture   . CVA (cerebral vascular accident)     STROKE 7 YRS AGO - DAY AFTER BRAIN SURGERY TO REMOVE BENIGN MENINGIOMA - HAS LEFT SIDED WEAKNESS AND A LITTLE NUMBNESS  . Pain     "EXCRUCIATING PAIN" LEFT HIP - HAS A FRACTURE - IS CONFINED TO W/C AT HOME - NO WEIGHT BEARING LEFT HIP.    Past Surgical History  Procedure Laterality Date  . Brain surgery      tumor removed  . Tonsillectomy    . Partial hysterectomy    . Glaucoma repair    . Cataract extraction    . Kidney  surgery      left - HX ENLARGED LEFT KIDNEY ON XRAY - SURGERY WAS DONE ON URETER   . Colonoscopy    . Coronary angioplasty    . Hip arthroplasty Left 12/29/2012    Procedure: LEFT HIP HEMI ARTHROPLASTY ;  Surgeon: Mauri Pole, MD;  Location: [facility];  Service: Orthopedics;  Laterality: Left;    There were no vitals filed for this visit.  Visit Diagnosis:  Parkinsonism  Generalized weakness  Muscle weakness (generalized)  Abnormality of gait  Low back pain without sciatica, unspecified back pain laterality  Impaired functional mobility, balance, gait, and endurance  Decreased spinal mobility  Stiffness of left knee  Hamstring tightness of left lower extremity      Subjective Assessment - 11/25/14 1620    Subjective Pt states she is doing well and continues to stay active.  No pain                         [facility] Adult PT Treatment/Exercise - 11/25/14 1548    Ambulation/Gait   Ambulation/Gait Yes   Ambulation/Gait Assistance 5: Supervision  Ambulation Distance (Feet) 452 Feet  1 bout without rest   Assistive device None   Gait Pattern Step-through pattern;Decreased step length - right;Decreased weight shift to left;Decreased trunk rotation;Trunk flexed;Narrow base of support   Gait Comments Gait without metronome, focus on sequencing and maintaining good posture while maintaining comfortable gait speed    Neck Exercises: Seated   Neck Retraction 10 reps;3 secs   Other Seated Exercise 3D thoracic excursion with UE movements 5x each   Lumbar Exercises: Seated   Sit to Stand 10 reps   Sit to Stand Limitations no UEs, low table, heels on sliding board    Lumbar Exercises: Supine   Other Supine Lumbar Exercises Full body stretch in supine 5 minutes on table with focus on ABD/ER of B UEs, cervical extension, and hip mobility within hip precautions   Knee/Hip Exercises: Aerobic   Stationary Bike Nustep seat 7 hillls 3 level 4 for 8 minutes                    PT Short Term Goals - 11/10/14 1341    PT SHORT TERM GOAL #1   Title Patient will demonstrate improved bilateral lower extremity strength to at least 4-/5 in order to improve overall stability and functional mobility skills    Time 3   Period Weeks   Status Achieved   PT SHORT TERM GOAL #2   Title Patient will demonstrate improved gait mechanics and will be able to ambulate at least 431ft with rolling walker and cues for improved mechanics and posture no more than 40% of the time, minimal fatigue   Time 3   Period Weeks   Status Achieved   PT SHORT TERM GOAL #3   Title Patient will demonstrate the ability to perform supine <-->sit with supervision and cues for sequencing no more than 20% of the time   Time 3   Period Weeks   Status Achieved   PT SHORT TERM GOAL #4   Title Patient will be able to perform sit to stand transfer with supervision, cues for proper form no more than 20% of the time and no posterior loss of balance    Baseline 6/22- able to do from some surfaces, other times still has difficulty    Time 3   Period Weeks   Status On-going   PT SHORT TERM GOAL #5   Title Patient and her spouse will be independent in correctly and consistently performing appropriate HEP with emphasis on large, high quality movement patterns    Time 3   Period Weeks   Status Achieved   PT SHORT TERM GOAL #6   Title Patient will experience no more than 3/10 pain in her low back and L hip during all weight bearing tasks and activities    Time 3   Period Weeks   Status On-going   PT SHORT TERM GOAL #7   Title Patient will demonstrate improved spinal mobilty by at least 10 degrees in order to reduce overall stiffness and reduce pain with mobiltiy    Time 3   Period Weeks   Status On-going   PT SHORT TERM GOAL #8   Title Patient will not experience no more than 3/10 pain in her back and L hip during gait distance of at least 510ft with no assistive device    Time 3    Period Weeks   Status On-going           PT Long Term Goals -  11/10/14 1345    PT LONG TERM GOAL #1   Title Patient will be able to perform TUG assessment in 15 seconds or less on 3/3 attempts, good safety awareness, and good balance throughout test    Baseline 6/22- DNT today    Time 6   Period Weeks   Status On-going   PT LONG TERM GOAL #2   Title Patient will be able to ambulate at least 1044ft with no assistive device, good posture, improved gait mechanics with equal step lengths, good clearance of floor, improved weight bearing through L leg, and increased stance time bilaterally with no posterior loss of balance and minimal fatigue    Time 6   Period Weeks   Status On-going   PT LONG TERM GOAL #3   Title Patient will be able to perform rolling, supine to sit, and sit to stand transfer with Mod(I) and good safety awareness, good posture, and high quality movements throughout, minimal fall risk    Time 6   Period Weeks   Status On-going   PT LONG TERM GOAL #4   Title Patient will as a whole demonstrate the ability to perform large, high quality movement patterns on a consistent basis with minimal posterior loss of balance and minimal fall risk in order to maximize her quality of life and ease of motion   Time 4   Period Weeks   Status On-going   PT LONG TERM GOAL #5   Title Patient and her spouse to be educated regarding BIG movement program/school of thought for people with Parkinsons; also to be educated regarding any available support/activity groups in the local area    Time 6   Period Weeks   Status On-going   PT LONG TERM GOAL #6   Title Patient will experience no more than 1/10 pain in her low back during all functional upright weight bearing tasks    Time 6   Period Weeks   Status On-going   PT LONG TERM GOAL #7   Title Patient will demonstrate the abilty to ambulate unlimited distances with no assistive device, minimal fatigue, pain no more than 1/10 in her  low back    Time 6   Period Weeks   Status On-going   PT LONG TERM GOAL #8   Title Patient will more easily be able to independently perform functional activities such as putting on her shoes and socks without assistance from her husband    Time 6   Period Weeks   Status On-going               Plan - 11/25/14 1614    Clinical Impression Statement Focus on postural stab and ambulation today.  PT with difficulty shifting weight forward over toes with sit to stand requiring therapist facilitation to complete. Ambullation more rhythmic now, however contninues to require cues for UE swing with ambulation.  Obstacles and turns challenge stability wtih slight controlled LOB and decreased gait velocity.    PT Next Visit Plan challenge gait with Navigation around corners and obstacles.Progress L glute med/max strengthening to improve standing SLS, and R step length during gait cycle. PRN manual to L glut med/max. Continue reciprocal tasks with metronome. Sit to stand training. Nustep.    PT Home Exercise Plan No Changes.    Consulted and Agree with Plan of Care Patient        Problem List Patient Active Problem List   Diagnosis Date Noted  . Abnormality of gait 08/12/2014  .  Parkinsonism 08/12/2014  . Stroke 08/12/2014  . Muscle weakness (generalized) 02/22/2014  . Stiffness of left knee 02/22/2014  . Hamstring tightness of left lower extremity 02/22/2014  . Pain in joint, pelvic region and thigh 02/22/2014  . Difficulty walking 04/07/2013  . Osteoporosis 03/10/2013  . Anemia 03/10/2013  . Mild malnutrition 03/10/2013  . S/P left hip hemiarthroplasty 12/30/2012  . Osteoporosis with pathological fracture with delayed healing 11/25/2012  . Pedal edema 10/27/2012  . Fracture of sacrum 10/02/2012  . Stricture and stenosis of esophagus 09/24/2011  . Hyperlipidemia 11/03/2008  . CAD, UNSPECIFIED SITE 11/03/2008    Teena Irani, PTA/CLT [phone]  11/25/2014, 4:20 PM  [facility] [address] [place], [place], [address] Phone: [phone]   Fax:  [phone]

## 2014-11-29 ENCOUNTER — Ambulatory Visit (HOSPITAL_COMMUNITY): Payer: Medicare Other | Admitting: Physical Therapy

## 2014-12-01 ENCOUNTER — Ambulatory Visit (HOSPITAL_COMMUNITY): Payer: Medicare Other | Admitting: Physical Therapy

## 2014-12-01 DIAGNOSIS — R269 Unspecified abnormalities of gait and mobility: Secondary | ICD-10-CM

## 2014-12-01 DIAGNOSIS — Z7409 Other reduced mobility: Secondary | ICD-10-CM

## 2014-12-01 DIAGNOSIS — G20 Parkinson's disease: Secondary | ICD-10-CM

## 2014-12-01 DIAGNOSIS — R293 Abnormal posture: Secondary | ICD-10-CM

## 2014-12-01 DIAGNOSIS — M6281 Muscle weakness (generalized): Secondary | ICD-10-CM

## 2014-12-01 DIAGNOSIS — R531 Weakness: Secondary | ICD-10-CM

## 2014-12-01 DIAGNOSIS — Z9181 History of falling: Secondary | ICD-10-CM

## 2014-12-01 DIAGNOSIS — M545 Low back pain: Secondary | ICD-10-CM

## 2014-12-01 DIAGNOSIS — R2689 Other abnormalities of gait and mobility: Secondary | ICD-10-CM

## 2014-12-01 NOTE — Therapy (Signed)
[place], [place], [address] Phone: [phone]   Fax:  [phone]  Physical Therapy Treatment  Patient Details  Name: Samantha Hudson MRN: [id number] Date of Birth: 03-Dec-1934 Referring Provider:  Marcial Pacas, MD  Encounter Date: 12/01/2014      PT End of Session - 12/01/14 1440    Visit Number 24   Number of Visits 30   Date for PT Re-Evaluation 12/08/14   Authorization Type Medicare   Authorization Time Period 10/13/14 to 12/13/14; G-code done 19th session    Authorization - Visit Number 24   Authorization - Number of Visits 30   PT Start Time 4128   PT Stop Time 1430   PT Time Calculation (min) 42 min   Activity Tolerance Patient tolerated treatment well   Behavior During Therapy [facility] for tasks assessed/performed      Past Medical History  Diagnosis Date  . Hyperlipemia   . IBS (irritable bowel syndrome)   . Osteoporosis   . Fibromyalgia   . Glaucoma     HAD LASER SURGERY - NO EYE DROPS REQUIRED  . Globus hystericus   . Fibromyalgia   . Meningioma   . CAD (coronary artery disease)     HEART STENT PLACED ABOUT 2000- NO LONGER SEES CARDIOLOGIST; NO C/O OF CHEST PAINS OR SOB  . Heart murmur     MVP SINCE THE 60'S - TAKES ATENOLOL -  . Anxiety   . Swelling     BILATERAL FEET AND LEGS - ONSET OF SWELLING APRIL 2014  . GERD (gastroesophageal reflux disease)   . Arthritis   . Fracture   . CVA (cerebral vascular accident)     STROKE 7 YRS AGO - DAY AFTER BRAIN SURGERY TO REMOVE BENIGN MENINGIOMA - HAS LEFT SIDED WEAKNESS AND A LITTLE NUMBNESS  . Pain     "EXCRUCIATING PAIN" LEFT HIP - HAS A FRACTURE - IS CONFINED TO W/C AT HOME - NO WEIGHT BEARING LEFT HIP.    Past Surgical History  Procedure Laterality Date  . Brain surgery      tumor removed  . Tonsillectomy    . Partial hysterectomy    . Glaucoma repair    . Cataract extraction    . Kidney surgery      left - HX ENLARGED LEFT KIDNEY ON  XRAY - SURGERY WAS DONE ON URETER   . Colonoscopy    . Coronary angioplasty    . Hip arthroplasty Left 12/29/2012    Procedure: LEFT HIP HEMI ARTHROPLASTY ;  Surgeon: Mauri Pole, MD;  Location: [facility];  Service: Orthopedics;  Laterality: Left;    There were no vitals filed for this visit.  Visit Diagnosis:  Parkinsonism  Generalized weakness  Muscle weakness (generalized)  Abnormality of gait  Low back pain without sciatica, unspecified back pain laterality  Impaired functional mobility, balance, gait, and endurance  Decreased spinal mobility  Risk for falls  Poor posture      Subjective Assessment - 12/01/14 1434    Subjective Patient states she is doing well, being as active as she can given the hot weather and frequent rain recently. Slight pain in R hip today.    Patient is accompained by: Family member   Pertinent History Diagnosed with Parkinsons around 30 days ago; also started taking Sinemet recently. Patient had some trouble getting over posterior hip surgery she had in 2014, symptoms pointed MD to diagnose new onset Parkinsons. 5/25-  patient has recently started to experience new onset low back pain, referral received from MD and evaluation completed with new updates to program added in today.    Currently in Pain? Yes   Pain Score 2    Pain Location Hip   Pain Orientation Right                         [facility] Adult PT Treatment/Exercise - 12/01/14 0001    Ambulation/Gait   Ambulation/Gait Yes   Ambulation/Gait Assistance 5: Supervision   Ambulation Distance (Feet) --  260ft x3   Assistive device None   Gait Pattern Step-through pattern;Decreased step length - right;Decreased weight shift to left;Decreased trunk rotation;Trunk flexed;Narrow base of support   Gait Comments Gait without metronome, focus on sequencing and maintaining good posture while maintaining comfortable gait speed    Lumbar Exercises: Standing   Other Standing Lumbar  Exercises Standing dowel rotations 1x15 each side with forcus on lumbar and pelvic mobility   Lumbar Exercises: Seated   Other Seated Lumbar Exercises Postural cone rotations (ipsilateral) with focus on reaching and posture; posterior shoulder rolls 1x20; trunk flexion and extension with dowel 1x15    Lumbar Exercises: Supine   Other Supine Lumbar Exercises Full body stretch in supine 5 minutes on table with focus on ABD/ER of B UEs, cervical extension, and hip mobility within hip precautions   Knee/Hip Exercises: Aerobic   Stationary Bike Nustep seat 7 hillls 3 level 4 for 8 minutes                 PT Education - 12/01/14 1440    Education provided No          PT Short Term Goals - 11/10/14 1341    PT SHORT TERM GOAL #1   Title Patient will demonstrate improved bilateral lower extremity strength to at least 4-/5 in order to improve overall stability and functional mobility skills    Time 3   Period Weeks   Status Achieved   PT SHORT TERM GOAL #2   Title Patient will demonstrate improved gait mechanics and will be able to ambulate at least 441ft with rolling walker and cues for improved mechanics and posture no more than 40% of the time, minimal fatigue   Time 3   Period Weeks   Status Achieved   PT SHORT TERM GOAL #3   Title Patient will demonstrate the ability to perform supine <-->sit with supervision and cues for sequencing no more than 20% of the time   Time 3   Period Weeks   Status Achieved   PT SHORT TERM GOAL #4   Title Patient will be able to perform sit to stand transfer with supervision, cues for proper form no more than 20% of the time and no posterior loss of balance    Baseline 6/22- able to do from some surfaces, other times still has difficulty    Time 3   Period Weeks   Status On-going   PT SHORT TERM GOAL #5   Title Patient and her spouse will be independent in correctly and consistently performing appropriate HEP with emphasis on large, high quality  movement patterns    Time 3   Period Weeks   Status Achieved   PT SHORT TERM GOAL #6   Title Patient will experience no more than 3/10 pain in her low back and L hip during all weight bearing tasks and activities    Time 3  Period Weeks   Status On-going   PT SHORT TERM GOAL #7   Title Patient will demonstrate improved spinal mobilty by at least 10 degrees in order to reduce overall stiffness and reduce pain with mobiltiy    Time 3   Period Weeks   Status On-going   PT SHORT TERM GOAL #8   Title Patient will not experience no more than 3/10 pain in her back and L hip during gait distance of at least 551ft with no assistive device    Time 3   Period Weeks   Status On-going           PT Long Term Goals - 11/10/14 1345    PT LONG TERM GOAL #1   Title Patient will be able to perform TUG assessment in 15 seconds or less on 3/3 attempts, good safety awareness, and good balance throughout test    Baseline 6/22- DNT today    Time 6   Period Weeks   Status On-going   PT LONG TERM GOAL #2   Title Patient will be able to ambulate at least 1017ft with no assistive device, good posture, improved gait mechanics with equal step lengths, good clearance of floor, improved weight bearing through L leg, and increased stance time bilaterally with no posterior loss of balance and minimal fatigue    Time 6   Period Weeks   Status On-going   PT LONG TERM GOAL #3   Title Patient will be able to perform rolling, supine to sit, and sit to stand transfer with Mod(I) and good safety awareness, good posture, and high quality movements throughout, minimal fall risk    Time 6   Period Weeks   Status On-going   PT LONG TERM GOAL #4   Title Patient will as a whole demonstrate the ability to perform large, high quality movement patterns on a consistent basis with minimal posterior loss of balance and minimal fall risk in order to maximize her quality of life and ease of motion   Time 4   Period Weeks    Status On-going   PT LONG TERM GOAL #5   Title Patient and her spouse to be educated regarding BIG movement program/school of thought for people with Parkinsons; also to be educated regarding any available support/activity groups in the local area    Time 6   Period Weeks   Status On-going   PT LONG TERM GOAL #6   Title Patient will experience no more than 1/10 pain in her low back during all functional upright weight bearing tasks    Time 6   Period Weeks   Status On-going   PT LONG TERM GOAL #7   Title Patient will demonstrate the abilty to ambulate unlimited distances with no assistive device, minimal fatigue, pain no more than 1/10 in her low back    Time 6   Period Weeks   Status On-going   PT LONG TERM GOAL #8   Title Patient will more easily be able to independently perform functional activities such as putting on her shoes and socks without assistance from her husband    Time 6   Period Weeks   Status On-going               Plan - 12/01/14 1441    Clinical Impression Statement Focus on posture, functional mobility of lumbar spine and pelvis via functional movement patterns in sitting and standing today. Also conitnued to focus on gait without assistive device  today. Patient tolerated session well but did comment that she has difficulty in shower at home. Patient did demonstrate improve motion after rhythmic mobility drills with dowels.    Pt will benefit from skilled therapeutic intervention in order to improve on the following deficits Abnormal gait;Decreased coordination;Difficulty walking;Impaired flexibility;Improper body mechanics;Postural dysfunction;Decreased safety awareness;Decreased endurance;Decreased activity tolerance;Decreased balance;Decreased mobility;Decreased strength;Other (comment);Hypomobility;Pain;Decreased range of motion   Rehab Potential Good   PT Frequency 2x / week   PT Duration 4 weeks   PT Treatment/Interventions ADLs/Self Care Home  Management;Gait training;Neuromuscular re-education;Visual/perceptual remediation/compensation;Stair training;Biofeedback;Functional mobility training;Patient/family education;Passive range of motion;Therapeutic activities;Therapeutic exercise;Manual techniques;Energy conservation;DME Instruction;Balance training   PT Next Visit Plan challenge gait with Navigation around corners and obstacles.Progress L glute med/max strengthening to improve standing SLS, and R step length during gait cycle. PRN manual to L glut med/max. Continue reciprocal tasks with metronome. Sit to stand training. Nustep.    PT Home Exercise Plan No Changes.    Consulted and Agree with Plan of Care Patient        Problem List Patient Active Problem List   Diagnosis Date Noted  . Abnormality of gait 08/12/2014  . Parkinsonism 08/12/2014  . Stroke 08/12/2014  . Muscle weakness (generalized) 02/22/2014  . Stiffness of left knee 02/22/2014  . Hamstring tightness of left lower extremity 02/22/2014  . Pain in joint, pelvic region and thigh 02/22/2014  . Difficulty walking 04/07/2013  . Osteoporosis 03/10/2013  . Anemia 03/10/2013  . Mild malnutrition 03/10/2013  . S/P left hip hemiarthroplasty 12/30/2012  . Osteoporosis with pathological fracture with delayed healing 11/25/2012  . Pedal edema 10/27/2012  . Fracture of sacrum 10/02/2012  . Stricture and stenosis of esophagus 09/24/2011  . Hyperlipidemia 11/03/2008  . CAD, UNSPECIFIED SITE 11/03/2008    Deniece Ree PT, DPT [phone]  [facility] [address] [place], [place], [address] Phone: [phone]   Fax:  [phone]

## 2014-12-03 ENCOUNTER — Ambulatory Visit (HOSPITAL_COMMUNITY): Payer: Medicare Other | Admitting: Physical Therapy

## 2014-12-03 DIAGNOSIS — R531 Weakness: Secondary | ICD-10-CM

## 2014-12-03 DIAGNOSIS — G20 Parkinson's disease: Secondary | ICD-10-CM | POA: Diagnosis not present

## 2014-12-03 DIAGNOSIS — R269 Unspecified abnormalities of gait and mobility: Secondary | ICD-10-CM

## 2014-12-03 DIAGNOSIS — Z9181 History of falling: Secondary | ICD-10-CM

## 2014-12-03 DIAGNOSIS — Z7409 Other reduced mobility: Secondary | ICD-10-CM

## 2014-12-03 NOTE — Therapy (Signed)
[facility] [place], [place], [address] Phone: [phone]   Fax:  [phone]  Physical Therapy Treatment  Patient Details  Name: Samantha Hudson MRN: [id number] Date of Birth: 1934-07-09 Referring Provider:  Marcial Pacas, MD  Encounter Date: 12/03/2014      PT End of Session - 12/03/14 1543    Visit Number 25   Number of Visits 30   Date for PT Re-Evaluation 12/08/14   Authorization Type Medicare   Authorization Time Period 10/13/14 to 12/13/14; G-code done 19th session    Authorization - Visit Number 25   Authorization - Number of Visits 30   PT Start Time 1346   PT Stop Time 1428   PT Time Calculation (min) 42 min   Equipment Utilized During Treatment Gait belt   Activity Tolerance Patient tolerated treatment well   Behavior During Therapy [facility] for tasks assessed/performed      Past Medical History  Diagnosis Date  . Hyperlipemia   . IBS (irritable bowel syndrome)   . Osteoporosis   . Fibromyalgia   . Glaucoma     HAD LASER SURGERY - NO EYE DROPS REQUIRED  . Globus hystericus   . Fibromyalgia   . Meningioma   . CAD (coronary artery disease)     HEART STENT PLACED ABOUT 2000- NO LONGER SEES CARDIOLOGIST; NO C/O OF CHEST PAINS OR SOB  . Heart murmur     MVP SINCE THE 60'S - TAKES ATENOLOL -  . Anxiety   . Swelling     BILATERAL FEET AND LEGS - ONSET OF SWELLING APRIL 2014  . GERD (gastroesophageal reflux disease)   . Arthritis   . Fracture   . CVA (cerebral vascular accident)     STROKE 7 YRS AGO - DAY AFTER BRAIN SURGERY TO REMOVE BENIGN MENINGIOMA - HAS LEFT SIDED WEAKNESS AND A LITTLE NUMBNESS  . Pain     "EXCRUCIATING PAIN" LEFT HIP - HAS A FRACTURE - IS CONFINED TO W/C AT HOME - NO WEIGHT BEARING LEFT HIP.    Past Surgical History  Procedure Laterality Date  . Brain surgery      tumor removed  . Tonsillectomy    . Partial hysterectomy    . Glaucoma repair    . Cataract extraction    . Kidney  surgery      left - HX ENLARGED LEFT KIDNEY ON XRAY - SURGERY WAS DONE ON URETER   . Colonoscopy    . Coronary angioplasty    . Hip arthroplasty Left 12/29/2012    Procedure: LEFT HIP HEMI ARTHROPLASTY ;  Surgeon: Mauri Pole, MD;  Location: [facility];  Service: Orthopedics;  Laterality: Left;    There were no vitals filed for this visit.  Visit Diagnosis:  Parkinsonism  Generalized weakness  Abnormality of gait  Impaired functional mobility, balance, gait, and endurance  Risk for falls      Subjective Assessment - 12/03/14 1354    Subjective Pt reports that she has some slight pain in her LLE, rating it a 3/10 today.    Pain Score 3    Pain Location Leg   Pain Orientation Left              [facility] Adult PT Treatment/Exercise - 12/03/14 0001    Ambulation/Gait   Ambulation/Gait Yes   Ambulation/Gait Assistance 5: Supervision   Ambulation Distance (Feet) 778 Feet   Assistive device Rolling walker;None  226' with RW, 452 with  no AD   Gait Pattern Step-through pattern;Decreased step length - right;Decreased weight shift to left;Decreased trunk rotation;Trunk flexed;Narrow base of support   Neck Exercises: Seated   Other Seated Exercise 3D thoracic excursion with UE movements 5x each   Lumbar Exercises: Standing   Other Standing Lumbar Exercises Standing dowel rotations 1x15 each side with forcus on lumbar and pelvic mobility   Lumbar Exercises: Seated   Other Seated Lumbar Exercises Postural cone rotations (ipsilateral) with focus on reaching and posture; posterior shoulder rolls 1x20; trunk flexion and extension with dowel 1x15    Lumbar Exercises: Supine   Other Supine Lumbar Exercises Full body stretch in supine 5 minutes on table with focus on ABD/ER of B UEs, cervical extension, and hip mobility within hip precautions                PT Education - 12/03/14 1543    Education provided No          PT Short Term Goals - 11/10/14 1341    PT SHORT TERM  GOAL #1   Title Patient will demonstrate improved bilateral lower extremity strength to at least 4-/5 in order to improve overall stability and functional mobility skills    Time 3   Period Weeks   Status Achieved   PT SHORT TERM GOAL #2   Title Patient will demonstrate improved gait mechanics and will be able to ambulate at least 433ft with rolling walker and cues for improved mechanics and posture no more than 40% of the time, minimal fatigue   Time 3   Period Weeks   Status Achieved   PT SHORT TERM GOAL #3   Title Patient will demonstrate the ability to perform supine <-->sit with supervision and cues for sequencing no more than 20% of the time   Time 3   Period Weeks   Status Achieved   PT SHORT TERM GOAL #4   Title Patient will be able to perform sit to stand transfer with supervision, cues for proper form no more than 20% of the time and no posterior loss of balance    Baseline 6/22- able to do from some surfaces, other times still has difficulty    Time 3   Period Weeks   Status On-going   PT SHORT TERM GOAL #5   Title Patient and her spouse will be independent in correctly and consistently performing appropriate HEP with emphasis on large, high quality movement patterns    Time 3   Period Weeks   Status Achieved   PT SHORT TERM GOAL #6   Title Patient will experience no more than 3/10 pain in her low back and L hip during all weight bearing tasks and activities    Time 3   Period Weeks   Status On-going   PT SHORT TERM GOAL #7   Title Patient will demonstrate improved spinal mobilty by at least 10 degrees in order to reduce overall stiffness and reduce pain with mobiltiy    Time 3   Period Weeks   Status On-going   PT SHORT TERM GOAL #8   Title Patient will not experience no more than 3/10 pain in her back and L hip during gait distance of at least 59ft with no assistive device    Time 3   Period Weeks   Status On-going           PT Long Term Goals - 11/10/14  1345    PT LONG TERM GOAL #1  Title Patient will be able to perform TUG assessment in 15 seconds or less on 3/3 attempts, good safety awareness, and good balance throughout test    Baseline 6/22- DNT today    Time 6   Period Weeks   Status On-going   PT LONG TERM GOAL #2   Title Patient will be able to ambulate at least 1075ft with no assistive device, good posture, improved gait mechanics with equal step lengths, good clearance of floor, improved weight bearing through L leg, and increased stance time bilaterally with no posterior loss of balance and minimal fatigue    Time 6   Period Weeks   Status On-going   PT LONG TERM GOAL #3   Title Patient will be able to perform rolling, supine to sit, and sit to stand transfer with Mod(I) and good safety awareness, good posture, and high quality movements throughout, minimal fall risk    Time 6   Period Weeks   Status On-going   PT LONG TERM GOAL #4   Title Patient will as a whole demonstrate the ability to perform large, high quality movement patterns on a consistent basis with minimal posterior loss of balance and minimal fall risk in order to maximize her quality of life and ease of motion   Time 4   Period Weeks   Status On-going   PT LONG TERM GOAL #5   Title Patient and her spouse to be educated regarding BIG movement program/school of thought for people with Parkinsons; also to be educated regarding any available support/activity groups in the local area    Time 6   Period Weeks   Status On-going   PT LONG TERM GOAL #6   Title Patient will experience no more than 1/10 pain in her low back during all functional upright weight bearing tasks    Time 6   Period Weeks   Status On-going   PT LONG TERM GOAL #7   Title Patient will demonstrate the abilty to ambulate unlimited distances with no assistive device, minimal fatigue, pain no more than 1/10 in her low back    Time 6   Period Weeks   Status On-going   PT LONG TERM GOAL #8    Title Patient will more easily be able to independently perform functional activities such as putting on her shoes and socks without assistance from her husband    Time 6   Period Weeks   Status On-going               Plan - 12/03/14 1606    Clinical Impression Statement Pt performed very well in today's treament session. She was able to ambulate without AD x 452 feet with minimal verbal cueing for step length and upright posture. Cone rotations were progressed by having pt retrieve cones from floor, which pt has difficulty performing at home.    PT Next Visit Plan challenge gait with Navigation around corners and obstacles.Progress L glute med/max strengthening to improve standing SLS, and R step length during gait cycle. continue reciprocal tasks with metronome.        Problem List Patient Active Problem List   Diagnosis Date Noted  . Abnormality of gait 08/12/2014  . Parkinsonism 08/12/2014  . Stroke 08/12/2014  . Muscle weakness (generalized) 02/22/2014  . Stiffness of left knee 02/22/2014  . Hamstring tightness of left lower extremity 02/22/2014  . Pain in joint, pelvic region and thigh 02/22/2014  . Difficulty walking 04/07/2013  . Osteoporosis 03/10/2013  .  Anemia 03/10/2013  . Mild malnutrition 03/10/2013  . S/P left hip hemiarthroplasty 12/30/2012  . Osteoporosis with pathological fracture with delayed healing 11/25/2012  . Pedal edema 10/27/2012  . Fracture of sacrum 10/02/2012  . Stricture and stenosis of esophagus 09/24/2011  . Hyperlipidemia 11/03/2008  . CAD, UNSPECIFIED SITE 11/03/2008    Hilma Favors, PT, DPT [phone] 12/03/2014, 4:08 PM  [facility] [address] [place], [place], [address] Phone: [phone]   Fax:  [phone]

## 2014-12-06 ENCOUNTER — Ambulatory Visit (HOSPITAL_COMMUNITY): Payer: Medicare Other | Admitting: Physical Therapy

## 2014-12-06 DIAGNOSIS — M545 Low back pain: Secondary | ICD-10-CM

## 2014-12-06 DIAGNOSIS — M6289 Other specified disorders of muscle: Secondary | ICD-10-CM

## 2014-12-06 DIAGNOSIS — M25552 Pain in left hip: Secondary | ICD-10-CM

## 2014-12-06 DIAGNOSIS — R2689 Other abnormalities of gait and mobility: Secondary | ICD-10-CM

## 2014-12-06 DIAGNOSIS — G20 Parkinson's disease: Secondary | ICD-10-CM

## 2014-12-06 DIAGNOSIS — Z9181 History of falling: Secondary | ICD-10-CM

## 2014-12-06 DIAGNOSIS — R531 Weakness: Secondary | ICD-10-CM

## 2014-12-06 DIAGNOSIS — R293 Abnormal posture: Secondary | ICD-10-CM

## 2014-12-06 DIAGNOSIS — Z7409 Other reduced mobility: Secondary | ICD-10-CM

## 2014-12-06 DIAGNOSIS — M6281 Muscle weakness (generalized): Secondary | ICD-10-CM

## 2014-12-06 DIAGNOSIS — M25662 Stiffness of left knee, not elsewhere classified: Secondary | ICD-10-CM

## 2014-12-06 DIAGNOSIS — R269 Unspecified abnormalities of gait and mobility: Secondary | ICD-10-CM

## 2014-12-06 NOTE — Therapy (Signed)
[facility] [place], [place], [address] Phone: [phone]   Fax:  [phone]  Physical Therapy Treatment  Patient Details  Name: Samantha Hudson MRN: [id number] Date of Birth: 04-11-1935 Referring Provider:  Marcial Pacas, MD  Encounter Date: 12/06/2014      PT End of Session - 12/06/14 1429    Visit Number 26   Number of Visits 30   Date for PT Re-Evaluation 12/08/14   Authorization Type Medicare   Authorization Time Period 10/13/14 to 12/13/14; G-code done 19th session    Authorization - Visit Number 26   Authorization - Number of Visits 30   PT Start Time 1350   PT Stop Time 1435   PT Time Calculation (min) 45 min   Equipment Utilized During Treatment Gait belt   Activity Tolerance Patient tolerated treatment well   Behavior During Therapy [facility] for tasks assessed/performed      Past Medical History  Diagnosis Date  . Hyperlipemia   . IBS (irritable bowel syndrome)   . Osteoporosis   . Fibromyalgia   . Glaucoma     HAD LASER SURGERY - NO EYE DROPS REQUIRED  . Globus hystericus   . Fibromyalgia   . Meningioma   . CAD (coronary artery disease)     HEART STENT PLACED ABOUT 2000- NO LONGER SEES CARDIOLOGIST; NO C/O OF CHEST PAINS OR SOB  . Heart murmur     MVP SINCE THE 60'S - TAKES ATENOLOL -  . Anxiety   . Swelling     BILATERAL FEET AND LEGS - ONSET OF SWELLING APRIL 2014  . GERD (gastroesophageal reflux disease)   . Arthritis   . Fracture   . CVA (cerebral vascular accident)     STROKE 7 YRS AGO - DAY AFTER BRAIN SURGERY TO REMOVE BENIGN MENINGIOMA - HAS LEFT SIDED WEAKNESS AND A LITTLE NUMBNESS  . Pain     "EXCRUCIATING PAIN" LEFT HIP - HAS A FRACTURE - IS CONFINED TO W/C AT HOME - NO WEIGHT BEARING LEFT HIP.    Past Surgical History  Procedure Laterality Date  . Brain surgery      tumor removed  . Tonsillectomy    . Partial hysterectomy    . Glaucoma repair    . Cataract extraction    . Kidney  surgery      left - HX ENLARGED LEFT KIDNEY ON XRAY - SURGERY WAS DONE ON URETER   . Colonoscopy    . Coronary angioplasty    . Hip arthroplasty Left 12/29/2012    Procedure: LEFT HIP HEMI ARTHROPLASTY ;  Surgeon: Mauri Pole, MD;  Location: [facility];  Service: Orthopedics;  Laterality: Left;    There were no vitals filed for this visit.  Visit Diagnosis:  Parkinsonism  Generalized weakness  Abnormality of gait  Impaired functional mobility, balance, gait, and endurance  Risk for falls  Muscle weakness (generalized)  Low back pain without sciatica, unspecified back pain laterality  Decreased spinal mobility  Poor posture  Stiffness of left knee  Hamstring tightness of left lower extremity  Pain in joint, pelvic region and thigh, left      Subjective Assessment - 12/06/14 1358    Subjective Pt states she is doing well today.  Reports no pain.   Currently in Pain? No/denies                         [facility] Adult PT  Treatment/Exercise - 12/06/14 1405    Ambulation/Gait   Pre-Gait Activities obstacle couse with cones and hurdles   Gait Comments Gait without metronome, focus on sequencing and maintaining good posture while maintaining comfortable gait speed    Neck Exercises: Seated   Other Seated Exercise 3D thoracic excursion with UE movements 10x each   Lumbar Exercises: Standing   Other Standing Lumbar Exercises Standing dowel rotations 1x15 each side with forcus on lumbar and pelvic mobility   Lumbar Exercises: Seated   Other Seated Lumbar Exercises Postural cone rotations (ipsilateral) with focus on reaching and posture; posterior shoulder rolls 1x20; trunk flexion and extension with dowel 1x15    Knee/Hip Exercises: Aerobic   Stationary Bike Nustep seat 7 hillls 3 level 4 for 8 minutes                   PT Short Term Goals - 11/10/14 1341    PT SHORT TERM GOAL #1   Title Patient will demonstrate improved bilateral lower extremity  strength to at least 4-/5 in order to improve overall stability and functional mobility skills    Time 3   Period Weeks   Status Achieved   PT SHORT TERM GOAL #2   Title Patient will demonstrate improved gait mechanics and will be able to ambulate at least 465ft with rolling walker and cues for improved mechanics and posture no more than 40% of the time, minimal fatigue   Time 3   Period Weeks   Status Achieved   PT SHORT TERM GOAL #3   Title Patient will demonstrate the ability to perform supine <-->sit with supervision and cues for sequencing no more than 20% of the time   Time 3   Period Weeks   Status Achieved   PT SHORT TERM GOAL #4   Title Patient will be able to perform sit to stand transfer with supervision, cues for proper form no more than 20% of the time and no posterior loss of balance    Baseline 6/22- able to do from some surfaces, other times still has difficulty    Time 3   Period Weeks   Status On-going   PT SHORT TERM GOAL #5   Title Patient and her spouse will be independent in correctly and consistently performing appropriate HEP with emphasis on large, high quality movement patterns    Time 3   Period Weeks   Status Achieved   PT SHORT TERM GOAL #6   Title Patient will experience no more than 3/10 pain in her low back and L hip during all weight bearing tasks and activities    Time 3   Period Weeks   Status On-going   PT SHORT TERM GOAL #7   Title Patient will demonstrate improved spinal mobilty by at least 10 degrees in order to reduce overall stiffness and reduce pain with mobiltiy    Time 3   Period Weeks   Status On-going   PT SHORT TERM GOAL #8   Title Patient will not experience no more than 3/10 pain in her back and L hip during gait distance of at least 571ft with no assistive device    Time 3   Period Weeks   Status On-going           PT Long Term Goals - 11/10/14 1345    PT LONG TERM GOAL #1   Title Patient will be able to perform TUG  assessment in 15 seconds or less on 3/3  attempts, good safety awareness, and good balance throughout test    Baseline 6/22- DNT today    Time 6   Period Weeks   Status On-going   PT LONG TERM GOAL #2   Title Patient will be able to ambulate at least 1026ft with no assistive device, good posture, improved gait mechanics with equal step lengths, good clearance of floor, improved weight bearing through L leg, and increased stance time bilaterally with no posterior loss of balance and minimal fatigue    Time 6   Period Weeks   Status On-going   PT LONG TERM GOAL #3   Title Patient will be able to perform rolling, supine to sit, and sit to stand transfer with Mod(I) and good safety awareness, good posture, and high quality movements throughout, minimal fall risk    Time 6   Period Weeks   Status On-going   PT LONG TERM GOAL #4   Title Patient will as a whole demonstrate the ability to perform large, high quality movement patterns on a consistent basis with minimal posterior loss of balance and minimal fall risk in order to maximize her quality of life and ease of motion   Time 4   Period Weeks   Status On-going   PT LONG TERM GOAL #5   Title Patient and her spouse to be educated regarding BIG movement program/school of thought for people with Parkinsons; also to be educated regarding any available support/activity groups in the local area    Time 6   Period Weeks   Status On-going   PT LONG TERM GOAL #6   Title Patient will experience no more than 1/10 pain in her low back during all functional upright weight bearing tasks    Time 6   Period Weeks   Status On-going   PT LONG TERM GOAL #7   Title Patient will demonstrate the abilty to ambulate unlimited distances with no assistive device, minimal fatigue, pain no more than 1/10 in her low back    Time 6   Period Weeks   Status On-going   PT LONG TERM GOAL #8   Title Patient will more easily be able to independently perform functional  activities such as putting on her shoes and socks without assistance from her husband    Time 6   Period Weeks   Status On-going               Plan - 12/06/14 1430    Clinical Impression Statement PRogressed ambulation with obstacles today including cones and hurdles.  PT with difficulty initially with hurdles, stopping completely prior to stepping over, however able to complete more smoothly next trial.  PT requires therapist faciilitation to keep upright with seated exercises due to stiffness in thoracic spine and core instability.     PT Next Visit Plan challenge gait with Navigation around corners and obstacles.Progress L glute med/max strengthening to improve standing SLS, and R step length during gait cycle. continue reciprocal tasks with metronome.        Problem List Patient Active Problem List   Diagnosis Date Noted  . Abnormality of gait 08/12/2014  . Parkinsonism 08/12/2014  . Stroke 08/12/2014  . Muscle weakness (generalized) 02/22/2014  . Stiffness of left knee 02/22/2014  . Hamstring tightness of left lower extremity 02/22/2014  . Pain in joint, pelvic region and thigh 02/22/2014  . Difficulty walking 04/07/2013  . Osteoporosis 03/10/2013  . Anemia 03/10/2013  . Mild malnutrition 03/10/2013  . S/P  left hip hemiarthroplasty 12/30/2012  . Osteoporosis with pathological fracture with delayed healing 11/25/2012  . Pedal edema 10/27/2012  . Fracture of sacrum 10/02/2012  . Stricture and stenosis of esophagus 09/24/2011  . Hyperlipidemia 11/03/2008  . CAD, UNSPECIFIED SITE 11/03/2008    Teena Irani, PTA/CLT [phone]  12/06/2014, 2:33 PM  [facility] [address] [place], [place], [address] Phone: [phone]   Fax:  [phone]

## 2014-12-08 ENCOUNTER — Ambulatory Visit (HOSPITAL_COMMUNITY): Payer: Medicare Other | Admitting: Physical Therapy

## 2014-12-08 DIAGNOSIS — R293 Abnormal posture: Secondary | ICD-10-CM

## 2014-12-08 DIAGNOSIS — M6281 Muscle weakness (generalized): Secondary | ICD-10-CM

## 2014-12-08 DIAGNOSIS — G20 Parkinson's disease: Secondary | ICD-10-CM | POA: Diagnosis not present

## 2014-12-08 DIAGNOSIS — M545 Low back pain: Secondary | ICD-10-CM

## 2014-12-08 DIAGNOSIS — Z7409 Other reduced mobility: Secondary | ICD-10-CM

## 2014-12-08 DIAGNOSIS — Z9181 History of falling: Secondary | ICD-10-CM

## 2014-12-08 DIAGNOSIS — R269 Unspecified abnormalities of gait and mobility: Secondary | ICD-10-CM

## 2014-12-08 DIAGNOSIS — R2689 Other abnormalities of gait and mobility: Secondary | ICD-10-CM

## 2014-12-08 DIAGNOSIS — R531 Weakness: Secondary | ICD-10-CM

## 2014-12-08 NOTE — Therapy (Signed)
[facility] [address] [place], [place], [address] Phone: [phone]   Fax:  [phone]  Physical Therapy Treatment (Re-assessment)  Patient Details  Name: Samantha Hudson MRN: [id number] Date of Birth: 08/01/1934 Referring Provider:  Marcial Pacas, MD  Encounter Date: 12/08/2014      PT End of Session - 12/08/14 1444    Visit Number 27   Number of Visits 33   Date for PT Re-Evaluation 01/05/15   Authorization Type Medicare   Authorization Time Period recert done on 3/73; G-code done 27th session    Authorization - Visit Number 27   Authorization - Number of Visits 37   PT Start Time 4287   PT Stop Time 1436   PT Time Calculation (min) 48 min   Activity Tolerance Patient tolerated treatment well   Behavior During Therapy [facility] for tasks assessed/performed      Past Medical History  Diagnosis Date  . Hyperlipemia   . IBS (irritable bowel syndrome)   . Osteoporosis   . Fibromyalgia   . Glaucoma     HAD LASER SURGERY - NO EYE DROPS REQUIRED  . Globus hystericus   . Fibromyalgia   . Meningioma   . CAD (coronary artery disease)     HEART STENT PLACED ABOUT 2000- NO LONGER SEES CARDIOLOGIST; NO C/O OF CHEST PAINS OR SOB  . Heart murmur     MVP SINCE THE 60'S - TAKES ATENOLOL -  . Anxiety   . Swelling     BILATERAL FEET AND LEGS - ONSET OF SWELLING APRIL 2014  . GERD (gastroesophageal reflux disease)   . Arthritis   . Fracture   . CVA (cerebral vascular accident)     STROKE 7 YRS AGO - DAY AFTER BRAIN SURGERY TO REMOVE BENIGN MENINGIOMA - HAS LEFT SIDED WEAKNESS AND A LITTLE NUMBNESS  . Pain     "EXCRUCIATING PAIN" LEFT HIP - HAS A FRACTURE - IS CONFINED TO W/C AT HOME - NO WEIGHT BEARING LEFT HIP.    Past Surgical History  Procedure Laterality Date  . Brain surgery      tumor removed  . Tonsillectomy    . Partial hysterectomy    . Glaucoma repair    . Cataract extraction    . Kidney surgery      left - HX ENLARGED  LEFT KIDNEY ON XRAY - SURGERY WAS DONE ON URETER   . Colonoscopy    . Coronary angioplasty    . Hip arthroplasty Left 12/29/2012    Procedure: LEFT HIP HEMI ARTHROPLASTY ;  Surgeon: Mauri Pole, MD;  Location: [facility];  Service: Orthopedics;  Laterality: Left;    There were no vitals filed for this visit.  Visit Diagnosis:  Parkinsonism - Plan: PT plan of care cert/re-cert  Generalized weakness - Plan: PT plan of care cert/re-cert  Abnormality of gait - Plan: PT plan of care cert/re-cert  Impaired functional mobility, balance, gait, and endurance - Plan: PT plan of care cert/re-cert  Risk for falls - Plan: PT plan of care cert/re-cert  Muscle weakness (generalized) - Plan: PT plan of care cert/re-cert  Low back pain without sciatica, unspecified back pain laterality - Plan: PT plan of care cert/re-cert  Decreased spinal mobility - Plan: PT plan of care cert/re-cert  Poor posture - Plan: PT plan of care cert/re-cert      Subjective Assessment - 12/08/14 1351    Subjective Patient reports she is doing well today, but  is dissapointed that she has not been able to go walking much in [place] due to heat and/or rain    Pertinent History Diagnosed with Parkinsons around 30 days ago; also started taking Sinemet recently. Patient had some trouble getting over posterior hip surgery she had in 2014, symptoms pointed MD to diagnose new onset Parkinsons. 5/25- patient has recently started to experience new onset low back pain, referral received from MD and evaluation completed with new updates to program added in today.    Currently in Pain? No/denies            [facility] PT Assessment - 12/08/14 0001    AROM   Lumbar Extension 3   Lumbar - Right Side Bend 16   Lumbar - Left Side Bend 21   Thoracic Flexion 50   Thoracic Extension 16   Thoracic - Right Side Bend 33   Thoracic - Left Side Bend 35   Thoracic - Right Rotation 55   Thoracic - Left Rotation 50   Strength   Right Hip  ABduction 4-/5   Left Hip ABduction 3+/5   Right Knee Flexion 4-/5   Right Knee Extension 4+/5   Left Knee Flexion 4-/5   Left Knee Extension 4/5   Right Ankle Dorsiflexion 4+/5   Left Ankle Dorsiflexion 4+/5   Ambulation/Gait   Gait Comments 6 minute walk 857f , no device                     [facility] Adult PT Treatment/Exercise - 12/08/14 0001    Bed Mobility   Rolling Right 7: Independent   Rolling Left 7: Independent   Supine to Sit 7: Independent   Sit to Supine 7: Independent   Transfers   Sit to Stand 6: Modified independent (Device/Increase time)   Sit to Stand Details (indicate cue type and reason) Mod(I) for both sit to stand and stand to sit, surface to surface transfers    Lumbar Exercises: Supine   Other Supine Lumbar Exercises Full body stretch in supine 5 minutes on table with focus on ABD/ER of B UEs, cervical extension, and hip mobility within hip precautions                PT Education - 12/08/14 1441    Education provided Yes   Education Details plan of care moving forward   Person(s) Educated Patient   Methods Explanation   Comprehension Verbalized understanding          PT Short Term Goals - 12/08/14 1419    PT SHORT TERM GOAL #1   Title Patient will demonstrate improved bilateral lower extremity strength to at least 4-/5 in order to improve overall stability and functional mobility skills    Time 3   Period Weeks   Status Achieved   PT SHORT TERM GOAL #2   Title Patient will demonstrate improved gait mechanics and will be able to ambulate at least 4067fwith rolling walker and cues for improved mechanics and posture no more than 40% of the time, minimal fatigue   Time 3   Period Weeks   Status Achieved   PT SHORT TERM GOAL #3   Title Patient will demonstrate the ability to perform supine <-->sit with supervision and cues for sequencing no more than 20% of the time   Time 3   Period Weeks   Status Achieved   PT SHORT TERM  GOAL #4   Title Patient will be able to perform sit  to stand transfer with supervision, cues for proper form no more than 20% of the time and no posterior loss of balance    Time 3   Period Weeks   Status Achieved   PT SHORT TERM GOAL #5   Title Patient and her spouse will be independent in correctly and consistently performing appropriate HEP with emphasis on large, high quality movement patterns    Time 3   Period Weeks   Status Achieved   PT SHORT TERM GOAL #6   Title Patient will experience no more than 3/10 pain in her low back and L hip during all weight bearing tasks and activities    Baseline 7/20- still having flare ups occasionally, but is less than 3/10 pain most of the time    Time 3   Period Weeks   Status Partially Met   PT SHORT TERM GOAL #7   Title Patient will demonstrate improved spinal mobilty by at least 10 degrees in order to reduce overall stiffness and reduce pain with mobiltiy    Time 3   Period Weeks   Status On-going   PT SHORT TERM GOAL #8   Title Patient will not experience no more than 3/10 pain in her back and L hip during gait distance of at least 554f with no assistive device    Baseline 7/20- usually less than 3/10 except for flareups    Time 3   Period Weeks   Status Partially Met           PT Long Term Goals - 12/08/14 1424    PT LONG TERM GOAL #2   Title Patient will be able to ambulate at least 10040fwith no assistive device, good posture, improved gait mechanics with equal step lengths, good clearance of floor, improved weight bearing through L leg, and increased stance time bilaterally with no posterior loss of balance and minimal fatigue    Time 6   Period Weeks   Status On-going   PT LONG TERM GOAL #3   Title Patient will be able to perform rolling, supine to sit, and sit to stand transfer with Mod(I) and good safety awareness, good posture, and high quality movements throughout, minimal fall risk    Baseline 7/20 remains fall risk  but overall mobilty and quality of motion has improved    Time 6   Period Weeks   Status Partially Met   PT LONG TERM GOAL #4   Title Patient will as a whole demonstrate the ability to perform large, high quality movement patterns on a consistent basis with minimal posterior loss of balance and minimal fall risk in order to maximize her quality of life and ease of motion   Time 4   Period Weeks   Status On-going   PT LONG TERM GOAL #5   Title Patient and her spouse to be educated regarding BIG movement program/school of thought for people with Parkinsons; also to be educated regarding any available support/activity groups in the local area    Time 6   Period Weeks   Status Partially Met   PT LONG TERM GOAL #6   Title Patient will experience no more than 1/10 pain in her low back during all functional upright weight bearing tasks    Time 6   Period Weeks   Status On-going   PT LONG TERM GOAL #7   Title Patient will demonstrate the abilty to ambulate unlimited distances with no assistive device, minimal fatigue, pain no more  than 1/10 in her low back    Time 6   Period Weeks   Status On-going   PT LONG TERM GOAL #8   Title Patient will more easily be able to independently perform functional activities such as putting on her shoes and socks without assistance from her husband    Baseline 2022/12/21 improving but still needs help, not consistent with it    Time 6   Period Weeks   Status On-going               Plan - 12-21-2014 1446    Clinical Impression Statement Re-assessment performed today. Patient is progressing well with improvements in functional mobility, gait, functional activity tolerance, and to some extent spinal range of motion and reduced pain. Patient reports that she is able to do most everything she needs to do around her home except for getting her shoes on by herself. However, she does continue to present as a fall risk with mild unsteadiness during gait and dificulty  in navigatingin corners and obstacles. Patient will benefit from continued skilled PT services for 6 more sessions in order to addressh her remaining deficits and assist her in reaching an optimal level of function.    Pt will benefit from skilled therapeutic intervention in order to improve on the following deficits Abnormal gait;Decreased coordination;Difficulty walking;Impaired flexibility;Improper body mechanics;Postural dysfunction;Decreased safety awareness;Decreased endurance;Decreased activity tolerance;Decreased balance;Decreased mobility;Decreased strength;Other (comment);Hypomobility;Pain;Decreased range of motion   Rehab Potential Good   PT Frequency 2x / week   PT Duration 4 weeks   PT Treatment/Interventions ADLs/Self Care Home Management;Gait training;Neuromuscular re-education;Visual/perceptual remediation/compensation;Stair training;Biofeedback;Functional mobility training;Patient/family education;Passive range of motion;Therapeutic activities;Therapeutic exercise;Manual techniques;Energy conservation;DME Instruction;Balance training   PT Next Visit Plan challenge gait with Navigation around corners and obstacles.Progress L glute med/max strengthening to improve standing SLS, and R step length during gait cycle. continue reciprocal tasks with metronome.   PT Home Exercise Plan No Changes.    Consulted and Agree with Plan of Care Patient          G-Codes - December 21, 2014 1449    Functional Assessment Tool Used Skilled clinical observation and assessment based on functional mobility, posture, gait impairments, strength, movement quality, and functional activity tolerance; also based on low back pain, reduced spinal mobility, reduced functional task performance    Functional Limitation Mobility: Walking and moving around   Mobility: Walking and Moving Around Current Status [phone]) At least 20 percent but less than 40 percent impaired, limited or restricted   Mobility: Walking and Moving  Around Goal Status [phone]) At least 1 percent but less than 20 percent impaired, limited or restricted   Other PT Primary Current Status (Q4696) At least 20 percent but less than 40 percent impaired, limited or restricted   Other PT Primary Goal Status (E9528) At least 1 percent but less than 20 percent impaired, limited or restricted      Problem List Patient Active Problem List   Diagnosis Date Noted  . Abnormality of gait 08/12/2014  . Parkinsonism 08/12/2014  . Stroke 08/12/2014  . Muscle weakness (generalized) 02/22/2014  . Stiffness of left knee 02/22/2014  . Hamstring tightness of left lower extremity 02/22/2014  . Pain in joint, pelvic region and thigh 02/22/2014  . Difficulty walking 04/07/2013  . Osteoporosis 03/10/2013  . Anemia 03/10/2013  . Mild malnutrition 03/10/2013  . S/P left hip hemiarthroplasty 12/30/2012  . Osteoporosis with pathological fracture with delayed healing 11/25/2012  . Pedal edema 10/27/2012  . Fracture of sacrum 10/02/2012  .  Stricture and stenosis of esophagus 09/24/2011  . Hyperlipidemia 11/03/2008  . CAD, UNSPECIFIED SITE 11/03/2008    Physical Therapy Progress Note  Dates of Reporting Period: 11/10/14  to 12/08/14  Objective Reports of Subjective Statement: see above   Objective Measurements: see above   Goal Update: see above   Plan:   Reason Skilled Services are Required: difficulty navigating obstacles and corners, back stiffness, reduced functional activity tolerance, unsteadiness   Deniece Ree PT, DPT [place], [place], [address] Phone: [phone]   Fax:  [phone]

## 2014-12-10 ENCOUNTER — Ambulatory Visit (HOSPITAL_COMMUNITY): Payer: Medicare Other | Admitting: Physical Therapy

## 2014-12-10 ENCOUNTER — Telehealth (HOSPITAL_COMMUNITY): Payer: Self-pay | Admitting: Physical Therapy

## 2014-12-10 NOTE — Telephone Encounter (Signed)
Patient did not show for her appointment this afternoon. Called and left message advising patient of date and time of next appointment.  Deniece Ree PT, DPT [phone]

## 2014-12-13 ENCOUNTER — Ambulatory Visit (HOSPITAL_COMMUNITY): Payer: Medicare Other | Admitting: Physical Therapy

## 2014-12-13 DIAGNOSIS — M6281 Muscle weakness (generalized): Secondary | ICD-10-CM

## 2014-12-13 DIAGNOSIS — R2689 Other abnormalities of gait and mobility: Secondary | ICD-10-CM

## 2014-12-13 DIAGNOSIS — M25662 Stiffness of left knee, not elsewhere classified: Secondary | ICD-10-CM

## 2014-12-13 DIAGNOSIS — R293 Abnormal posture: Secondary | ICD-10-CM

## 2014-12-13 DIAGNOSIS — M6289 Other specified disorders of muscle: Secondary | ICD-10-CM

## 2014-12-13 DIAGNOSIS — M545 Low back pain: Secondary | ICD-10-CM

## 2014-12-13 DIAGNOSIS — Z7409 Other reduced mobility: Secondary | ICD-10-CM

## 2014-12-13 DIAGNOSIS — Z9181 History of falling: Secondary | ICD-10-CM

## 2014-12-13 DIAGNOSIS — R269 Unspecified abnormalities of gait and mobility: Secondary | ICD-10-CM

## 2014-12-13 DIAGNOSIS — G20 Parkinson's disease: Secondary | ICD-10-CM

## 2014-12-13 DIAGNOSIS — R531 Weakness: Secondary | ICD-10-CM

## 2014-12-13 NOTE — Therapy (Signed)
[facility] [address] [place], [place], [address] Phone: [phone]   Fax:  [phone]  Physical Therapy Treatment  Patient Details  Name: Samantha Hudson MRN: [id number] Date of Birth: Oct 02, 1934 Referring Provider:  Marcial Pacas, MD  Encounter Date: 12/13/2014      PT End of Session - 12/13/14 1432    Visit Number 28   Number of Visits 33   Date for PT Re-Evaluation 01/05/15   Authorization Type Medicare   Authorization Time Period recert done on 6/46; G-code done 27th session    Authorization - Visit Number 28   Authorization - Number of Visits 37   PT Start Time 1350   PT Stop Time 1440   PT Time Calculation (min) 50 min   Activity Tolerance Patient tolerated treatment well   Behavior During Therapy [facility] for tasks assessed/performed      Past Medical History  Diagnosis Date  . Hyperlipemia   . IBS (irritable bowel syndrome)   . Osteoporosis   . Fibromyalgia   . Glaucoma     HAD LASER SURGERY - NO EYE DROPS REQUIRED  . Globus hystericus   . Fibromyalgia   . Meningioma   . CAD (coronary artery disease)     HEART STENT PLACED ABOUT 2000- NO LONGER SEES CARDIOLOGIST; NO C/O OF CHEST PAINS OR SOB  . Heart murmur     MVP SINCE THE 60'S - TAKES ATENOLOL -  . Anxiety   . Swelling     BILATERAL FEET AND LEGS - ONSET OF SWELLING APRIL 2014  . GERD (gastroesophageal reflux disease)   . Arthritis   . Fracture   . CVA (cerebral vascular accident)     STROKE 7 YRS AGO - DAY AFTER BRAIN SURGERY TO REMOVE BENIGN MENINGIOMA - HAS LEFT SIDED WEAKNESS AND A LITTLE NUMBNESS  . Pain     "EXCRUCIATING PAIN" LEFT HIP - HAS A FRACTURE - IS CONFINED TO W/C AT HOME - NO WEIGHT BEARING LEFT HIP.    Past Surgical History  Procedure Laterality Date  . Brain surgery      tumor removed  . Tonsillectomy    . Partial hysterectomy    . Glaucoma repair    . Cataract extraction    . Kidney surgery      left - HX ENLARGED LEFT KIDNEY ON  XRAY - SURGERY WAS DONE ON URETER   . Colonoscopy    . Coronary angioplasty    . Hip arthroplasty Left 12/29/2012    Procedure: LEFT HIP HEMI ARTHROPLASTY ;  Surgeon: Mauri Pole, MD;  Location: [facility];  Service: Orthopedics;  Laterality: Left;    There were no vitals filed for this visit.  Visit Diagnosis:  Parkinsonism  Generalized weakness  Abnormality of gait  Impaired functional mobility, balance, gait, and endurance  Risk for falls  Muscle weakness (generalized)  Low back pain without sciatica, unspecified back pain laterality  Decreased spinal mobility  Poor posture  Hamstring tightness of left lower extremity  Stiffness of left knee      Subjective Assessment - 12/13/14 1438    Subjective Pt states it was too hot over the weekend to walk outdoors.  States she is still doing her exercises.  No pain                         [facility] Adult PT Treatment/Exercise - 12/13/14 1402    Ambulation/Gait  Gait Comments gait without AD X 675 feet   Neck Exercises: Seated   Other Seated Exercise 3D thoracic excursion with UE movements 10x each   Lumbar Exercises: Standing   Other Standing Lumbar Exercises Standing 1# dowel rotations 1x15 each side with forcus on lumbar and pelvic mobility   Lumbar Exercises: Seated   Sit to Stand 10 reps   Sit to Stand Limitations no UEs, low table   Other Seated Lumbar Exercises seated horiz rotations, abduction and flexion with 1# dowel rod 15 reps each   Lumbar Exercises: Supine   Other Supine Lumbar Exercises Full body stretch in supine 5 minutes on table with focus on ABD/ER of B UEs, cervical extension, and hip mobility within hip precautions   Knee/Hip Exercises: Aerobic   Stationary Bike Nustep seat 7 hillls 3 level 4 for 8 minutes                   PT Short Term Goals - 12/08/14 1419    PT SHORT TERM GOAL #1   Title Patient will demonstrate improved bilateral lower extremity strength to at least  4-/5 in order to improve overall stability and functional mobility skills    Time 3   Period Weeks   Status Achieved   PT SHORT TERM GOAL #2   Title Patient will demonstrate improved gait mechanics and will be able to ambulate at least 4109f with rolling walker and cues for improved mechanics and posture no more than 40% of the time, minimal fatigue   Time 3   Period Weeks   Status Achieved   PT SHORT TERM GOAL #3   Title Patient will demonstrate the ability to perform supine <-->sit with supervision and cues for sequencing no more than 20% of the time   Time 3   Period Weeks   Status Achieved   PT SHORT TERM GOAL #4   Title Patient will be able to perform sit to stand transfer with supervision, cues for proper form no more than 20% of the time and no posterior loss of balance    Time 3   Period Weeks   Status Achieved   PT SHORT TERM GOAL #5   Title Patient and her spouse will be independent in correctly and consistently performing appropriate HEP with emphasis on large, high quality movement patterns    Time 3   Period Weeks   Status Achieved   PT SHORT TERM GOAL #6   Title Patient will experience no more than 3/10 pain in her low back and L hip during all weight bearing tasks and activities    Baseline 7/20- still having flare ups occasionally, but is less than 3/10 pain most of the time    Time 3   Period Weeks   Status Partially Met   PT SHORT TERM GOAL #7   Title Patient will demonstrate improved spinal mobilty by at least 10 degrees in order to reduce overall stiffness and reduce pain with mobiltiy    Time 3   Period Weeks   Status On-going   PT SHORT TERM GOAL #8   Title Patient will not experience no more than 3/10 pain in her back and L hip during gait distance of at least 5048fwith no assistive device    Baseline 7/20- usually less than 3/10 except for flareups    Time 3   Period Weeks   Status Partially Met           PT Long  Term Goals - 12/08/14 1424     PT LONG TERM GOAL #2   Title Patient will be able to ambulate at least 10110f with no assistive device, good posture, improved gait mechanics with equal step lengths, good clearance of floor, improved weight bearing through L leg, and increased stance time bilaterally with no posterior loss of balance and minimal fatigue    Time 6   Period Weeks   Status On-going   PT LONG TERM GOAL #3   Title Patient will be able to perform rolling, supine to sit, and sit to stand transfer with Mod(I) and good safety awareness, good posture, and high quality movements throughout, minimal fall risk    Baseline 7/20 remains fall risk but overall mobilty and quality of motion has improved    Time 6   Period Weeks   Status Partially Met   PT LONG TERM GOAL #4   Title Patient will as a whole demonstrate the ability to perform large, high quality movement patterns on a consistent basis with minimal posterior loss of balance and minimal fall risk in order to maximize her quality of life and ease of motion   Time 4   Period Weeks   Status On-going   PT LONG TERM GOAL #5   Title Patient and her spouse to be educated regarding BIG movement program/school of thought for people with Parkinsons; also to be educated regarding any available support/activity groups in the local area    Time 6   Period Weeks   Status Partially Met   PT LONG TERM GOAL #6   Title Patient will experience no more than 1/10 pain in her low back during all functional upright weight bearing tasks    Time 6   Period Weeks   Status On-going   PT LONG TERM GOAL #7   Title Patient will demonstrate the abilty to ambulate unlimited distances with no assistive device, minimal fatigue, pain no more than 1/10 in her low back    Time 6   Period Weeks   Status On-going   PT LONG TERM GOAL #8   Title Patient will more easily be able to independently perform functional activities such as putting on her shoes and socks without assistance from her husband     Baseline 7/20 improving but still needs help, not consistent with it    Time 6   Period Weeks   Status On-going               Plan - 12/13/14 1433    Clinical Impression Statement Continued with full body stretch followed by postural strengthening and spinal mobility actvities.  PT able to ambulate X 675 feet today, only with VC's to decrease lateral lean to Rt toward end of session.     Rehab Potential Good   PT Frequency 2x / week   PT Duration 4 weeks   PT Next Visit Plan challenge gait with Navigation around corners and obstacles.Progress L glute med/max strengthening to improve standing SLS, and R step length during gait cycle. continue reciprocal tasks with metronome.   PT Home Exercise Plan No Changes.    Consulted and Agree with Plan of Care Patient        Problem List Patient Active Problem List   Diagnosis Date Noted  . Abnormality of gait 08/12/2014  . Parkinsonism 08/12/2014  . Stroke 08/12/2014  . Muscle weakness (generalized) 02/22/2014  . Stiffness of left knee 02/22/2014  . Hamstring tightness of left lower  extremity 02/22/2014  . Pain in joint, pelvic region and thigh 02/22/2014  . Difficulty walking 04/07/2013  . Osteoporosis 03/10/2013  . Anemia 03/10/2013  . Mild malnutrition 03/10/2013  . S/P left hip hemiarthroplasty 12/30/2012  . Osteoporosis with pathological fracture with delayed healing 11/25/2012  . Pedal edema 10/27/2012  . Fracture of sacrum 10/02/2012  . Stricture and stenosis of esophagus 09/24/2011  . Hyperlipidemia 11/03/2008  . CAD, UNSPECIFIED SITE 11/03/2008    Teena Irani, PTA/CLT [phone]  12/13/2014, 2:38 PM  [facility] [address] [place], [place], [address] Phone: [phone]   Fax:  [phone]

## 2014-12-14 ENCOUNTER — Telehealth (HOSPITAL_COMMUNITY): Payer: Self-pay | Admitting: Physical Therapy

## 2014-12-14 NOTE — Telephone Encounter (Signed)
Attempted to return patient's phone call. Both home line and mobile number went to voicemail. Left message on home number requesting that patient call clinic back.  Deniece Ree PT, DPT [phone]

## 2014-12-14 NOTE — Telephone Encounter (Signed)
Returned patient's phone call. Patient and her husband apologetic for missing one appointment, report that they thought it had been cancelled. Confirmed time and date of next appointment.   Deniece Ree PT, DPT [phone]

## 2014-12-15 ENCOUNTER — Ambulatory Visit (HOSPITAL_COMMUNITY): Payer: Medicare Other | Admitting: Physical Therapy

## 2014-12-15 DIAGNOSIS — R531 Weakness: Secondary | ICD-10-CM

## 2014-12-15 DIAGNOSIS — R269 Unspecified abnormalities of gait and mobility: Secondary | ICD-10-CM

## 2014-12-15 DIAGNOSIS — M6281 Muscle weakness (generalized): Secondary | ICD-10-CM

## 2014-12-15 DIAGNOSIS — M545 Low back pain: Secondary | ICD-10-CM

## 2014-12-15 DIAGNOSIS — Z7409 Other reduced mobility: Secondary | ICD-10-CM

## 2014-12-15 DIAGNOSIS — Z9181 History of falling: Secondary | ICD-10-CM

## 2014-12-15 DIAGNOSIS — G20 Parkinson's disease: Secondary | ICD-10-CM

## 2014-12-15 DIAGNOSIS — R293 Abnormal posture: Secondary | ICD-10-CM

## 2014-12-15 DIAGNOSIS — R2689 Other abnormalities of gait and mobility: Secondary | ICD-10-CM

## 2014-12-15 NOTE — Therapy (Signed)
[facility] [address] [place], [place], [address] Phone: [phone]   Fax:  [phone]  Physical Therapy Treatment  Patient Details  Name: Samantha Hudson MRN: [id number] Date of Birth: Jun 02, 1934 Referring Provider:  Marcial Pacas, MD  Encounter Date: 12/15/2014      PT End of Session - 12/15/14 1440    Visit Number 29   Number of Visits 33   Date for PT Re-Evaluation 01/05/15   Authorization Type Medicare   Authorization Time Period recert done on 6/01; G-code done 27th session    Authorization - Visit Number 29   Authorization - Number of Visits 37   PT Start Time 0932   PT Stop Time 1430   PT Time Calculation (min) 42 min   Activity Tolerance Patient tolerated treatment well   Behavior During Therapy [facility] for tasks assessed/performed      Past Medical History  Diagnosis Date  . Hyperlipemia   . IBS (irritable bowel syndrome)   . Osteoporosis   . Fibromyalgia   . Glaucoma     HAD LASER SURGERY - NO EYE DROPS REQUIRED  . Globus hystericus   . Fibromyalgia   . Meningioma   . CAD (coronary artery disease)     HEART STENT PLACED ABOUT 2000- NO LONGER SEES CARDIOLOGIST; NO C/O OF CHEST PAINS OR SOB  . Heart murmur     MVP SINCE THE 60'S - TAKES ATENOLOL -  . Anxiety   . Swelling     BILATERAL FEET AND LEGS - ONSET OF SWELLING APRIL 2014  . GERD (gastroesophageal reflux disease)   . Arthritis   . Fracture   . CVA (cerebral vascular accident)     STROKE 7 YRS AGO - DAY AFTER BRAIN SURGERY TO REMOVE BENIGN MENINGIOMA - HAS LEFT SIDED WEAKNESS AND A LITTLE NUMBNESS  . Pain     "EXCRUCIATING PAIN" LEFT HIP - HAS A FRACTURE - IS CONFINED TO W/C AT HOME - NO WEIGHT BEARING LEFT HIP.    Past Surgical History  Procedure Laterality Date  . Brain surgery      tumor removed  . Tonsillectomy    . Partial hysterectomy    . Glaucoma repair    . Cataract extraction    . Kidney surgery      left - HX ENLARGED LEFT KIDNEY ON  XRAY - SURGERY WAS DONE ON URETER   . Colonoscopy    . Coronary angioplasty    . Hip arthroplasty Left 12/29/2012    Procedure: LEFT HIP HEMI ARTHROPLASTY ;  Surgeon: Mauri Pole, MD;  Location: [facility];  Service: Orthopedics;  Laterality: Left;    There were no vitals filed for this visit.  Visit Diagnosis:  Parkinsonism  Generalized weakness  Abnormality of gait  Impaired functional mobility, balance, gait, and endurance  Risk for falls  Muscle weakness (generalized)  Low back pain without sciatica, unspecified back pain laterality  Decreased spinal mobility  Poor posture      Subjective Assessment - 12/15/14 1353    Subjective Patient reports she is still doing her exercises, still too hot to walk outside.    Pertinent History Diagnosed with Parkinsons around 30 days ago; also started taking Sinemet recently. Patient had some trouble getting over posterior hip surgery she had in 2014, symptoms pointed MD to diagnose new onset Parkinsons. 5/25- patient has recently started to experience new onset low back pain, referral received from MD and evaluation completed with  new updates to program added in today.    Currently in Pain? No/denies                         [facility] Adult PT Treatment/Exercise - 12/15/14 0001    Lumbar Exercises: Standing   Other Standing Lumbar Exercises Standing dowel rotations in frontal, transverse planes with focus on pelvic and trunk mobiltiy    Other Standing Lumbar Exercises Navigation around cones; DGI with focus on head movement and sudden stops/turns   Lumbar Exercises: Supine   Other Supine Lumbar Exercises Full body stretch in supine 5 minutes on table with focus on ABD/ER of B UEs, cervical extension, and hip mobility within hip precautions   Knee/Hip Exercises: Aerobic   Stationary Bike Nustep seat 7 hillls 3 level 4 for 10 minutes                 PT Education - 12/15/14 1439    Education provided Yes    Education Details gave Parkinsons support group  flier    Person(s) Educated Patient   Methods Explanation   Comprehension Verbalized understanding          PT Short Term Goals - 12/08/14 1419    PT SHORT TERM GOAL #1   Title Patient will demonstrate improved bilateral lower extremity strength to at least 4-/5 in order to improve overall stability and functional mobility skills    Time 3   Period Weeks   Status Achieved   PT SHORT TERM GOAL #2   Title Patient will demonstrate improved gait mechanics and will be able to ambulate at least 432f with rolling walker and cues for improved mechanics and posture no more than 40% of the time, minimal fatigue   Time 3   Period Weeks   Status Achieved   PT SHORT TERM GOAL #3   Title Patient will demonstrate the ability to perform supine <-->sit with supervision and cues for sequencing no more than 20% of the time   Time 3   Period Weeks   Status Achieved   PT SHORT TERM GOAL #4   Title Patient will be able to perform sit to stand transfer with supervision, cues for proper form no more than 20% of the time and no posterior loss of balance    Time 3   Period Weeks   Status Achieved   PT SHORT TERM GOAL #5   Title Patient and her spouse will be independent in correctly and consistently performing appropriate HEP with emphasis on large, high quality movement patterns    Time 3   Period Weeks   Status Achieved   PT SHORT TERM GOAL #6   Title Patient will experience no more than 3/10 pain in her low back and L hip during all weight bearing tasks and activities    Baseline 7/20- still having flare ups occasionally, but is less than 3/10 pain most of the time    Time 3   Period Weeks   Status Partially Met   PT SHORT TERM GOAL #7   Title Patient will demonstrate improved spinal mobilty by at least 10 degrees in order to reduce overall stiffness and reduce pain with mobiltiy    Time 3   Period Weeks   Status On-going   PT SHORT TERM GOAL  #8   Title Patient will not experience no more than 3/10 pain in her back and L hip during gait distance of at least 5043f  with no assistive device    Baseline 7/20- usually less than 3/10 except for flareups    Time 3   Period Weeks   Status Partially Met           PT Long Term Goals - 12/08/14 1424    PT LONG TERM GOAL #2   Title Patient will be able to ambulate at least 1043f with no assistive device, good posture, improved gait mechanics with equal step lengths, good clearance of floor, improved weight bearing through L leg, and increased stance time bilaterally with no posterior loss of balance and minimal fatigue    Time 6   Period Weeks   Status On-going   PT LONG TERM GOAL #3   Title Patient will be able to perform rolling, supine to sit, and sit to stand transfer with Mod(I) and good safety awareness, good posture, and high quality movements throughout, minimal fall risk    Baseline 7/20 remains fall risk but overall mobilty and quality of motion has improved    Time 6   Period Weeks   Status Partially Met   PT LONG TERM GOAL #4   Title Patient will as a whole demonstrate the ability to perform large, high quality movement patterns on a consistent basis with minimal posterior loss of balance and minimal fall risk in order to maximize her quality of life and ease of motion   Time 4   Period Weeks   Status On-going   PT LONG TERM GOAL #5   Title Patient and her spouse to be educated regarding BIG movement program/school of thought for people with Parkinsons; also to be educated regarding any available support/activity groups in the local area    Time 6   Period Weeks   Status Partially Met   PT LONG TERM GOAL #6   Title Patient will experience no more than 1/10 pain in her low back during all functional upright weight bearing tasks    Time 6   Period Weeks   Status On-going   PT LONG TERM GOAL #7   Title Patient will demonstrate the abilty to ambulate unlimited  distances with no assistive device, minimal fatigue, pain no more than 1/10 in her low back    Time 6   Period Weeks   Status On-going   PT LONG TERM GOAL #8   Title Patient will more easily be able to independently perform functional activities such as putting on her shoes and socks without assistance from her husband    Baseline 7/20 improving but still needs help, not consistent with it    Time 6   Period Weeks   Status On-going               Plan - 12/15/14 1441    Clinical Impression Statement Continued focus on full body stretching and functional dynamic postural mobility; also focused on dynamic challenges such as navigation around cones and corners, also introduced DGI activities. Patient performed and tolerated session well today. Gave patient a copy of Parkinsons Support Group flier.    Pt will benefit from skilled therapeutic intervention in order to improve on the following deficits Abnormal gait;Decreased coordination;Difficulty walking;Impaired flexibility;Improper body mechanics;Postural dysfunction;Decreased safety awareness;Decreased endurance;Decreased activity tolerance;Decreased balance;Decreased mobility;Decreased strength;Other (comment);Hypomobility;Pain;Decreased range of motion   Rehab Potential Good   PT Frequency 2x / week   PT Duration 4 weeks   PT Treatment/Interventions ADLs/Self Care Home Management;Gait training;Neuromuscular re-education;Visual/perceptual remediation/compensation;Stair training;Biofeedback;Functional mobility training;Patient/family education;Passive range of motion;Therapeutic activities;Therapeutic exercise;Manual  techniques;Energy conservation;DME Instruction;Balance training   PT Next Visit Plan challenge gait with Navigation around corners and obstacles.Progress L glute med/max strengthening to improve standing SLS, and R step length during gait cycle. continue reciprocal tasks with metronome.   PT Home Exercise Plan No Changes.     Consulted and Agree with Plan of Care Patient        Problem List Patient Active Problem List   Diagnosis Date Noted  . Abnormality of gait 08/12/2014  . Parkinsonism 08/12/2014  . Stroke 08/12/2014  . Muscle weakness (generalized) 02/22/2014  . Stiffness of left knee 02/22/2014  . Hamstring tightness of left lower extremity 02/22/2014  . Pain in joint, pelvic region and thigh 02/22/2014  . Difficulty walking 04/07/2013  . Osteoporosis 03/10/2013  . Anemia 03/10/2013  . Mild malnutrition 03/10/2013  . S/P left hip hemiarthroplasty 12/30/2012  . Osteoporosis with pathological fracture with delayed healing 11/25/2012  . Pedal edema 10/27/2012  . Fracture of sacrum 10/02/2012  . Stricture and stenosis of esophagus 09/24/2011  . Hyperlipidemia 11/03/2008  . CAD, UNSPECIFIED SITE 11/03/2008    Deniece Ree PT, DPT [phone]  [facility] [address] [place], [place], [address] Phone: [phone]   Fax:  [phone]

## 2014-12-17 ENCOUNTER — Ambulatory Visit (HOSPITAL_COMMUNITY): Payer: Medicare Other | Admitting: Physical Therapy

## 2014-12-24 ENCOUNTER — Ambulatory Visit (HOSPITAL_COMMUNITY): Payer: Medicare Other | Attending: Neurology

## 2014-12-24 DIAGNOSIS — R269 Unspecified abnormalities of gait and mobility: Secondary | ICD-10-CM

## 2014-12-24 DIAGNOSIS — G20 Parkinson's disease: Secondary | ICD-10-CM | POA: Diagnosis present

## 2014-12-24 DIAGNOSIS — R2689 Other abnormalities of gait and mobility: Secondary | ICD-10-CM | POA: Insufficient documentation

## 2014-12-24 DIAGNOSIS — M629 Disorder of muscle, unspecified: Secondary | ICD-10-CM | POA: Diagnosis present

## 2014-12-24 DIAGNOSIS — Z9181 History of falling: Secondary | ICD-10-CM | POA: Diagnosis present

## 2014-12-24 DIAGNOSIS — Z7409 Other reduced mobility: Secondary | ICD-10-CM | POA: Insufficient documentation

## 2014-12-24 DIAGNOSIS — M25662 Stiffness of left knee, not elsewhere classified: Secondary | ICD-10-CM

## 2014-12-24 DIAGNOSIS — R531 Weakness: Secondary | ICD-10-CM

## 2014-12-24 DIAGNOSIS — M25552 Pain in left hip: Secondary | ICD-10-CM | POA: Diagnosis present

## 2014-12-24 DIAGNOSIS — M545 Low back pain: Secondary | ICD-10-CM | POA: Insufficient documentation

## 2014-12-24 DIAGNOSIS — M6289 Other specified disorders of muscle: Secondary | ICD-10-CM

## 2014-12-24 DIAGNOSIS — M6281 Muscle weakness (generalized): Secondary | ICD-10-CM | POA: Insufficient documentation

## 2014-12-24 DIAGNOSIS — R293 Abnormal posture: Secondary | ICD-10-CM | POA: Diagnosis present

## 2014-12-24 NOTE — Therapy (Signed)
[facility] [address] [place], [place], [address] Phone: [phone]   Fax:  [phone]  Physical Therapy Treatment  Patient Details  Name: Samantha Hudson MRN: [id number] Date of Birth: Apr 06, 1935 Referring Provider:  Marcial Pacas, MD  Encounter Date: 12/24/2014      PT End of Session - 12/24/14 1440    Visit Number 30   Number of Visits 33   Date for PT Re-Evaluation 01/05/15   Authorization Type Medicare   Authorization Time Period recert done on 7/94; G-code done 27th session    Authorization - Visit Number 30   Authorization - Number of Visits 37   PT Start Time 8016   PT Stop Time 1525   PT Time Calculation (min) 53 min   Equipment Utilized During Treatment Gait belt   Activity Tolerance Patient tolerated treatment well   Behavior During Therapy [facility] for tasks assessed/performed      Past Medical History  Diagnosis Date  . Hyperlipemia   . IBS (irritable bowel syndrome)   . Osteoporosis   . Fibromyalgia   . Glaucoma     HAD LASER SURGERY - NO EYE DROPS REQUIRED  . Globus hystericus   . Fibromyalgia   . Meningioma   . CAD (coronary artery disease)     HEART STENT PLACED ABOUT 2000- NO LONGER SEES CARDIOLOGIST; NO C/O OF CHEST PAINS OR SOB  . Heart murmur     MVP SINCE THE 60'S - TAKES ATENOLOL -  . Anxiety   . Swelling     BILATERAL FEET AND LEGS - ONSET OF SWELLING APRIL 2014  . GERD (gastroesophageal reflux disease)   . Arthritis   . Fracture   . CVA (cerebral vascular accident)     STROKE 7 YRS AGO - DAY AFTER BRAIN SURGERY TO REMOVE BENIGN MENINGIOMA - HAS LEFT SIDED WEAKNESS AND A LITTLE NUMBNESS  . Pain     "EXCRUCIATING PAIN" LEFT HIP - HAS A FRACTURE - IS CONFINED TO W/C AT HOME - NO WEIGHT BEARING LEFT HIP.    Past Surgical History  Procedure Laterality Date  . Brain surgery      tumor removed  . Tonsillectomy    . Partial hysterectomy    . Glaucoma repair    . Cataract extraction    . Kidney  surgery      left - HX ENLARGED LEFT KIDNEY ON XRAY - SURGERY WAS DONE ON URETER   . Colonoscopy    . Coronary angioplasty    . Hip arthroplasty Left 12/29/2012    Procedure: LEFT HIP HEMI ARTHROPLASTY ;  Surgeon: Mauri Pole, MD;  Location: [facility];  Service: Orthopedics;  Laterality: Left;    There were no vitals filed for this visit.  Visit Diagnosis:  Parkinsonism  Generalized weakness  Abnormality of gait  Impaired functional mobility, balance, gait, and endurance  Risk for falls  Muscle weakness (generalized)  Low back pain without sciatica, unspecified back pain laterality  Decreased spinal mobility  Poor posture  Hamstring tightness of left lower extremity  Stiffness of left knee  Pain in joint, pelvic region and thigh, left      Subjective Assessment - 12/24/14 1438    Subjective Pt reported she went walking outside in parking lot for 15 minutes and walked 3-4 minutes without walker   Currently in Pain? No/denies          [facility] Adult PT Treatment/Exercise - 12/24/14 0001  Exercises   Exercises Knee/Hip;Neck;Lumbar   Lumbar Exercises: Standing   Other Standing Lumbar Exercises Standing dowel rotations in frontal and PDF D2 , transverse planes with focus on pelvic and trunk mobiltiy    Other Standing Lumbar Exercises Navigation around cones, 6 and 12in hurdles ; DGI with focus on head movement and sudden stops/turns   Lumbar Exercises: Supine   Other Supine Lumbar Exercises Full body stretch in supine 5 minutes on table with focus on ABD/ER of B UEs, cervical extension, and hip mobility within hip precautions   Knee/Hip Exercises: Aerobic   Stationary Bike Nustep seat 7 hillls 3 level 4 for 10 minutes              Balance Exercises - 12/24/14 1519    Balance Exercises: Standing   Tandem Stance Eyes open;3 reps;30 secs   SLS 3 reps;Eyes open  Rt 44", Lt 17" min guard             PT Short Term Goals - 12/08/14 1419    PT SHORT TERM  GOAL #1   Title Patient will demonstrate improved bilateral lower extremity strength to at least 4-/5 in order to improve overall stability and functional mobility skills    Time 3   Period Weeks   Status Achieved   PT SHORT TERM GOAL #2   Title Patient will demonstrate improved gait mechanics and will be able to ambulate at least 426f with rolling walker and cues for improved mechanics and posture no more than 40% of the time, minimal fatigue   Time 3   Period Weeks   Status Achieved   PT SHORT TERM GOAL #3   Title Patient will demonstrate the ability to perform supine <-->sit with supervision and cues for sequencing no more than 20% of the time   Time 3   Period Weeks   Status Achieved   PT SHORT TERM GOAL #4   Title Patient will be able to perform sit to stand transfer with supervision, cues for proper form no more than 20% of the time and no posterior loss of balance    Time 3   Period Weeks   Status Achieved   PT SHORT TERM GOAL #5   Title Patient and her spouse will be independent in correctly and consistently performing appropriate HEP with emphasis on large, high quality movement patterns    Time 3   Period Weeks   Status Achieved   PT SHORT TERM GOAL #6   Title Patient will experience no more than 3/10 pain in her low back and L hip during all weight bearing tasks and activities    Baseline 7/20- still having flare ups occasionally, but is less than 3/10 pain most of the time    Time 3   Period Weeks   Status Partially Met   PT SHORT TERM GOAL #7   Title Patient will demonstrate improved spinal mobilty by at least 10 degrees in order to reduce overall stiffness and reduce pain with mobiltiy    Time 3   Period Weeks   Status On-going   PT SHORT TERM GOAL #8   Title Patient will not experience no more than 3/10 pain in her back and L hip during gait distance of at least 5061fwith no assistive device    Baseline 7/20- usually less than 3/10 except for flareups    Time 3    Period Weeks   Status Partially Met  PT Long Term Goals - 12/24/14 1614    PT LONG TERM GOAL #1   Title Patient will be able to perform TUG assessment in 15 seconds or less on 3/3 attempts, good safety awareness, and good balance throughout test    Status On-going   PT LONG TERM GOAL #2   Title Patient will be able to ambulate at least 1078f with no assistive device, good posture, improved gait mechanics with equal step lengths, good clearance of floor, improved weight bearing through L leg, and increased stance time bilaterally with no posterior loss of balance and minimal fatigue    Status On-going   PT LONG TERM GOAL #3   Title Patient will be able to perform rolling, supine to sit, and sit to stand transfer with Mod(I) and good safety awareness, good posture, and high quality movements throughout, minimal fall risk    Status On-going   PT LONG TERM GOAL #4   Title Patient will as a whole demonstrate the ability to perform large, high quality movement patterns on a consistent basis with minimal posterior loss of balance and minimal fall risk in order to maximize her quality of life and ease of motion   Status On-going   PT LONG TERM GOAL #5   Title Patient and her spouse to be educated regarding BIG movement program/school of thought for people with Parkinsons; also to be educated regarding any available support/activity groups in the local area    Status On-going   PT LONG TERM GOAL #6   Title Patient will experience no more than 1/10 pain in her low back during all functional upright weight bearing tasks    PT LONG TERM GOAL #7   Title Patient will demonstrate the abilty to ambulate unlimited distances with no assistive device, minimal fatigue, pain no more than 1/10 in her low back    PT LONG TERM GOAL #8   Title Patient will more easily be able to independently perform functional activities such as putting on her shoes and socks without assistance from her husband                 Plan - 12/24/14 1609    Clinical Impression Statement Continued session focus on initial full body stretching and functional dynamic postural mobility.  Added 6 then 12in hurdles to obstacle course to increased SLS activities and DGI activities with no AD, min assistnace for LOB with SLS activities.  Began tandem stance and SLS with min guard and increased assistnace with Lt LE weight bearing.  Pt limited by fatigue at end of session, no reports of pain.     PT Next Visit Plan challenge gait with Navigation around corners and obstacles.Progress L glute med/max strengthening to improve standing SLS, and R step length during gait cycle. continue reciprocal tasks with metronome.        Problem List Patient Active Problem List   Diagnosis Date Noted  . Abnormality of gait 08/12/2014  . Parkinsonism 08/12/2014  . Stroke 08/12/2014  . Muscle weakness (generalized) 02/22/2014  . Stiffness of left knee 02/22/2014  . Hamstring tightness of left lower extremity 02/22/2014  . Pain in joint, pelvic region and thigh 02/22/2014  . Difficulty walking 04/07/2013  . Osteoporosis 03/10/2013  . Anemia 03/10/2013  . Mild malnutrition 03/10/2013  . S/P left hip hemiarthroplasty 12/30/2012  . Osteoporosis with pathological fracture with delayed healing 11/25/2012  . Pedal edema 10/27/2012  . Fracture of sacrum 10/02/2012  . Stricture and stenosis of  esophagus 09/24/2011  . Hyperlipidemia 11/03/2008  . CAD, UNSPECIFIED SITE 11/03/2008   Ihor Austin, [place]; [place]  Aldona Lento 12/24/2014, 4:17 PM  [facility] [address] [place], [place], [address] Phone: [phone]   Fax:  [phone]

## 2014-12-28 ENCOUNTER — Ambulatory Visit (HOSPITAL_COMMUNITY): Payer: Medicare Other | Admitting: Physical Therapy

## 2014-12-28 DIAGNOSIS — G20 Parkinson's disease: Secondary | ICD-10-CM

## 2014-12-28 DIAGNOSIS — Z9181 History of falling: Secondary | ICD-10-CM

## 2014-12-28 DIAGNOSIS — R269 Unspecified abnormalities of gait and mobility: Secondary | ICD-10-CM

## 2014-12-28 DIAGNOSIS — R531 Weakness: Secondary | ICD-10-CM

## 2014-12-28 DIAGNOSIS — Z7409 Other reduced mobility: Secondary | ICD-10-CM

## 2014-12-28 NOTE — Therapy (Signed)
[facility] [address] [place], [place], [address] Phone: [phone]   Fax:  [phone]  Physical Therapy Treatment  Patient Details  Name: Samantha Hudson MRN: [id number] Date of Birth: 1934/12/04 Referring Provider:  Marcial Pacas, MD  Encounter Date: 12/28/2014      PT End of Session - 12/28/14 1356    Visit Number 31   Number of Visits 33   Date for PT Re-Evaluation 01/05/15   Authorization Type Medicare   Authorization Time Period recert done on 3/32; G-code done 27th session    Authorization - Visit Number 31   Authorization - Number of Visits 37   PT Start Time 1309   PT Stop Time 1405   PT Time Calculation (min) 56 min   Equipment Utilized During Treatment Gait belt   Activity Tolerance Patient tolerated treatment well   Behavior During Therapy [facility] for tasks assessed/performed      Past Medical History  Diagnosis Date  . Hyperlipemia   . IBS (irritable bowel syndrome)   . Osteoporosis   . Fibromyalgia   . Glaucoma     HAD LASER SURGERY - NO EYE DROPS REQUIRED  . Globus hystericus   . Fibromyalgia   . Meningioma   . CAD (coronary artery disease)     HEART STENT PLACED ABOUT 2000- NO LONGER SEES CARDIOLOGIST; NO C/O OF CHEST PAINS OR SOB  . Heart murmur     MVP SINCE THE 60'S - TAKES ATENOLOL -  . Anxiety   . Swelling     BILATERAL FEET AND LEGS - ONSET OF SWELLING APRIL 2014  . GERD (gastroesophageal reflux disease)   . Arthritis   . Fracture   . CVA (cerebral vascular accident)     STROKE 7 YRS AGO - DAY AFTER BRAIN SURGERY TO REMOVE BENIGN MENINGIOMA - HAS LEFT SIDED WEAKNESS AND A LITTLE NUMBNESS  . Pain     "EXCRUCIATING PAIN" LEFT HIP - HAS A FRACTURE - IS CONFINED TO W/C AT HOME - NO WEIGHT BEARING LEFT HIP.    Past Surgical History  Procedure Laterality Date  . Brain surgery      tumor removed  . Tonsillectomy    . Partial hysterectomy    . Glaucoma repair    . Cataract extraction    . Kidney  surgery      left - HX ENLARGED LEFT KIDNEY ON XRAY - SURGERY WAS DONE ON URETER   . Colonoscopy    . Coronary angioplasty    . Hip arthroplasty Left 12/29/2012    Procedure: LEFT HIP HEMI ARTHROPLASTY ;  Surgeon: Mauri Pole, MD;  Location: [facility];  Service: Orthopedics;  Laterality: Left;    There were no vitals filed for this visit.  Visit Diagnosis:  Parkinsonism  Generalized weakness  Abnormality of gait  Impaired functional mobility, balance, gait, and endurance  Risk for falls      Subjective Assessment - 12/28/14 1309    Subjective Pt reported that she feels just fair, and reports that she had a meltdown today. She reports that she feels like she hasn't been doing as well for the past couple of days.                          [facility] PT Treatment/Exercise - 12/28/14 0001    Lumbar Exercises: Standing   Other Standing Lumbar Exercises Standing dowel rotations in frontal and PDF D2 ,  transverse planes with focus on pelvic and trunk mobiltiy    Other Standing Lumbar Exercises Navigation around cones, 6 and 12in hurdles, on and off of steps of varying heights ; DGI with focus on head movement and sudden stops/turns   Lumbar Exercises: Supine   Other Supine Lumbar Exercises Full body stretch in supine 5 minutes on table with focus on ABD/ER of B UEs, cervical extension, and hip mobility within hip precautions   Knee/Hip Exercises: Aerobic   Stationary Bike Nustep seat 7 hillls 3 level 4 for 10 minutes                   PT Short Term Goals - 12/08/14 1419    PT SHORT TERM GOAL #1   Title Patient will demonstrate improved bilateral lower extremity strength to at least 4-/5 in order to improve overall stability and functional mobility skills    Time 3   Period Weeks   Status Achieved   PT SHORT TERM GOAL #2   Title Patient will demonstrate improved gait mechanics and will be able to ambulate at least 478f with rolling walker and cues for  improved mechanics and posture no more than 40% of the time, minimal fatigue   Time 3   Period Weeks   Status Achieved   PT SHORT TERM GOAL #3   Title Patient will demonstrate the ability to perform supine <-->sit with supervision and cues for sequencing no more than 20% of the time   Time 3   Period Weeks   Status Achieved   PT SHORT TERM GOAL #4   Title Patient will be able to perform sit to stand transfer with supervision, cues for proper form no more than 20% of the time and no posterior loss of balance    Time 3   Period Weeks   Status Achieved   PT SHORT TERM GOAL #5   Title Patient and her spouse will be independent in correctly and consistently performing appropriate HEP with emphasis on large, high quality movement patterns    Time 3   Period Weeks   Status Achieved   PT SHORT TERM GOAL #6   Title Patient will experience no more than 3/10 pain in her low back and L hip during all weight bearing tasks and activities    Baseline 7/20- still having flare ups occasionally, but is less than 3/10 pain most of the time    Time 3   Period Weeks   Status Partially Met   PT SHORT TERM GOAL #7   Title Patient will demonstrate improved spinal mobilty by at least 10 degrees in order to reduce overall stiffness and reduce pain with mobiltiy    Time 3   Period Weeks   Status On-going   PT SHORT TERM GOAL #8   Title Patient will not experience no more than 3/10 pain in her back and L hip during gait distance of at least 5033fwith no assistive device    Baseline 7/20- usually less than 3/10 except for flareups    Time 3   Period Weeks   Status Partially Met           PT Long Term Goals - 12/24/14 1614    PT LONG TERM GOAL #1   Title Patient will be able to perform TUG assessment in 15 seconds or less on 3/3 attempts, good safety awareness, and good balance throughout test    Status On-going   PT LONG TERM GOAL #  2   Title Patient will be able to ambulate at least 1036f with no  assistive device, good posture, improved gait mechanics with equal step lengths, good clearance of floor, improved weight bearing through L leg, and increased stance time bilaterally with no posterior loss of balance and minimal fatigue    Status On-going   PT LONG TERM GOAL #3   Title Patient will be able to perform rolling, supine to sit, and sit to stand transfer with Mod(I) and good safety awareness, good posture, and high quality movements throughout, minimal fall risk    Status On-going   PT LONG TERM GOAL #4   Title Patient will as a whole demonstrate the ability to perform large, high quality movement patterns on a consistent basis with minimal posterior loss of balance and minimal fall risk in order to maximize her quality of life and ease of motion   Status On-going   PT LONG TERM GOAL #5   Title Patient and her spouse to be educated regarding BIG movement program/school of thought for people with Parkinsons; also to be educated regarding any available support/activity groups in the local area    Status On-going   PT LONG TERM GOAL #6   Title Patient will experience no more than 1/10 pain in her low back during all functional upright weight bearing tasks    PT LONG TERM GOAL #7   Title Patient will demonstrate the abilty to ambulate unlimited distances with no assistive device, minimal fatigue, pain no more than 1/10 in her low back    PT LONG TERM GOAL #8   Title Patient will more easily be able to independently perform functional activities such as putting on her shoes and socks without assistance from her husband                Plan - 12/28/14 1357    Clinical Impression Statement Treatment session focused on improving gait when ambulating around obstacles, on and off of steps, over hurdles, and on uneven surfaces. Pt experienced no LOB during treatment session, even when stepping over 12 inch hurdles and onto 6" step. Gait training also focused on sudden pivot turns to  challenge balance and simulate walking in the community. Pt denied any increased fatigue or onset of pain post treatment.    PT Next Visit Plan Continue with gait training on/off steps and uneven surfaces. Progress glut med/max strengthening   Consulted and Agree with Plan of Care Patient        Problem List Patient Active Problem List   Diagnosis Date Noted  . Abnormality of gait 08/12/2014  . Parkinsonism 08/12/2014  . Stroke 08/12/2014  . Muscle weakness (generalized) 02/22/2014  . Stiffness of left knee 02/22/2014  . Hamstring tightness of left lower extremity 02/22/2014  . Pain in joint, pelvic region and thigh 02/22/2014  . Difficulty walking 04/07/2013  . Osteoporosis 03/10/2013  . Anemia 03/10/2013  . Mild malnutrition 03/10/2013  . S/P left hip hemiarthroplasty 12/30/2012  . Osteoporosis with pathological fracture with delayed healing 11/25/2012  . Pedal edema 10/27/2012  . Fracture of sacrum 10/02/2012  . Stricture and stenosis of esophagus 09/24/2011  . Hyperlipidemia 11/03/2008  . CAD, UNSPECIFIED SITE 11/03/2008    LHilma Favors PT, DPT [phone]/01/2015, 2:02 PM  [id number] [id number]: [phone]  Fax:  [phone]

## 2014-12-30 ENCOUNTER — Encounter (HOSPITAL_COMMUNITY): Payer: Medicare Other

## 2015-01-03 ENCOUNTER — Encounter (HOSPITAL_COMMUNITY): Payer: Medicare Other | Admitting: Physical Therapy

## 2015-01-04 ENCOUNTER — Ambulatory Visit (HOSPITAL_COMMUNITY): Payer: Medicare Other

## 2015-01-04 DIAGNOSIS — Z7409 Other reduced mobility: Secondary | ICD-10-CM

## 2015-01-04 DIAGNOSIS — M6289 Other specified disorders of muscle: Secondary | ICD-10-CM

## 2015-01-04 DIAGNOSIS — R2689 Other abnormalities of gait and mobility: Secondary | ICD-10-CM

## 2015-01-04 DIAGNOSIS — M25552 Pain in left hip: Secondary | ICD-10-CM

## 2015-01-04 DIAGNOSIS — R293 Abnormal posture: Secondary | ICD-10-CM

## 2015-01-04 DIAGNOSIS — G20 Parkinson's disease: Secondary | ICD-10-CM | POA: Diagnosis not present

## 2015-01-04 DIAGNOSIS — R531 Weakness: Secondary | ICD-10-CM

## 2015-01-04 DIAGNOSIS — R269 Unspecified abnormalities of gait and mobility: Secondary | ICD-10-CM

## 2015-01-04 DIAGNOSIS — M25662 Stiffness of left knee, not elsewhere classified: Secondary | ICD-10-CM

## 2015-01-04 DIAGNOSIS — M6281 Muscle weakness (generalized): Secondary | ICD-10-CM

## 2015-01-04 DIAGNOSIS — M545 Low back pain: Secondary | ICD-10-CM

## 2015-01-04 DIAGNOSIS — Z9181 History of falling: Secondary | ICD-10-CM

## 2015-01-04 NOTE — Therapy (Signed)
[facility] [address] [place], [place], [address] Phone: [phone]   Fax:  [phone]  Physical Therapy Treatment  Patient Details  Name: Samantha Hudson MRN: [id number] Date of Birth: Aug 12, 1934 Referring Provider:  Kathyrn Drown, MD  Encounter Date: 01/04/2015      PT End of Session - 01/04/15 1739    Visit Number 32   Number of Visits 33   Date for PT Re-Evaluation 01/05/15   Authorization Type Medicare   Authorization Time Period recert done on 0/16; G-code done 27th session    Authorization - Visit Number 32   Authorization - Number of Visits 37   PT Start Time 5537   PT Stop Time 1740   PT Time Calculation (min) 50 min   Equipment Utilized During Treatment Gait belt   Activity Tolerance Patient tolerated treatment well   Behavior During Therapy [facility] for tasks assessed/performed      Past Medical History  Diagnosis Date  . Hyperlipemia   . IBS (irritable bowel syndrome)   . Osteoporosis   . Fibromyalgia   . Glaucoma     HAD LASER SURGERY - NO EYE DROPS REQUIRED  . Globus hystericus   . Fibromyalgia   . Meningioma   . CAD (coronary artery disease)     HEART STENT PLACED ABOUT 2000- NO LONGER SEES CARDIOLOGIST; NO C/O OF CHEST PAINS OR SOB  . Heart murmur     MVP SINCE THE 60'S - TAKES ATENOLOL -  . Anxiety   . Swelling     BILATERAL FEET AND LEGS - ONSET OF SWELLING APRIL 2014  . GERD (gastroesophageal reflux disease)   . Arthritis   . Fracture   . CVA (cerebral vascular accident)     STROKE 7 YRS AGO - DAY AFTER BRAIN SURGERY TO REMOVE BENIGN MENINGIOMA - HAS LEFT SIDED WEAKNESS AND A LITTLE NUMBNESS  . Pain     "EXCRUCIATING PAIN" LEFT HIP - HAS A FRACTURE - IS CONFINED TO W/C AT HOME - NO WEIGHT BEARING LEFT HIP.    Past Surgical History  Procedure Laterality Date  . Brain surgery      tumor removed  . Tonsillectomy    . Partial hysterectomy    . Glaucoma repair    . Cataract extraction    .  Kidney surgery      left - HX ENLARGED LEFT KIDNEY ON XRAY - SURGERY WAS DONE ON URETER   . Colonoscopy    . Coronary angioplasty    . Hip arthroplasty Left 12/29/2012    Procedure: LEFT HIP HEMI ARTHROPLASTY ;  Surgeon: Mauri Pole, MD;  Location: [facility];  Service: Orthopedics;  Laterality: Left;    There were no vitals filed for this visit.  Visit Diagnosis:  Parkinsonism  Generalized weakness  Abnormality of gait  Impaired functional mobility, balance, gait, and endurance  Risk for falls  Muscle weakness (generalized)  Low back pain without sciatica, unspecified back pain laterality  Decreased spinal mobility  Poor posture  Hamstring tightness of left lower extremity  Stiffness of left knee  Pain in joint, pelvic region and thigh, left      Subjective Assessment - 01/04/15 1658    Subjective Pt stated she went for long ride past [place] area with 4 rest stops on the way back.  Reports increased Lt hip and LE pain following.  Pain scale 6/10 with pain medication   Currently in Pain? Yes  Pain Score 6    Pain Location Leg   Pain Orientation Left   Pain Descriptors / Indicators [facility] Treatment/Exercise - 01/04/15 0001    Exercises   Exercises Knee/Hip;Neck;Lumbar   Lumbar Exercises: Standing   Other Standing Lumbar Exercises Standing dowel rotations in frontal and PDF D2 , transverse planes with focus on pelvic and trunk mobiltiy    Other Standing Lumbar Exercises Navigation around cones, 6 and 12in hurdles, on and off of steps of varying heights, toe tapping, tandem stance 60" holds on airex    Lumbar Exercises: Supine   Other Supine Lumbar Exercises Full body stretch in supine 6 minutes on table with focus on ABD/ER of B UEs, cervical extension, and hip mobility within hip precautions   Knee/Hip Exercises: Standing   Lateral Step Up Left;10 reps;Hand Hold: 1   Lateral Step Up Limitations 7 in step   Forward  Step Up Left;10 reps;Hand Hold: 1   Forward Step Up Limitations 7in step   Stairs 3RT reciprocal pattern 1 HR 7 and 4 in steps                  PT Short Term Goals - 12/08/14 1419    PT SHORT TERM GOAL #1   Title Patient will demonstrate improved bilateral lower extremity strength to at least 4-/5 in order to improve overall stability and functional mobility skills    Time 3   Period Weeks   Status Achieved   PT SHORT TERM GOAL #2   Title Patient will demonstrate improved gait mechanics and will be able to ambulate at least 472f with rolling walker and cues for improved mechanics and posture no more than 40% of the time, minimal fatigue   Time 3   Period Weeks   Status Achieved   PT SHORT TERM GOAL #3   Title Patient will demonstrate the ability to perform supine <-->sit with supervision and cues for sequencing no more than 20% of the time   Time 3   Period Weeks   Status Achieved   PT SHORT TERM GOAL #4   Title Patient will be able to perform sit to stand transfer with supervision, cues for proper form no more than 20% of the time and no posterior loss of balance    Time 3   Period Weeks   Status Achieved   PT SHORT TERM GOAL #5   Title Patient and her spouse will be independent in correctly and consistently performing appropriate HEP with emphasis on large, high quality movement patterns    Time 3   Period Weeks   Status Achieved   PT SHORT TERM GOAL #6   Title Patient will experience no more than 3/10 pain in her low back and L hip during all weight bearing tasks and activities    Baseline 7/20- still having flare ups occasionally, but is less than 3/10 pain most of the time    Time 3   Period Weeks   Status Partially Met   PT SHORT TERM GOAL #7   Title Patient will demonstrate improved spinal mobilty by at least 10 degrees in order to reduce overall stiffness and reduce pain with mobiltiy    Time 3   Period Weeks   Status On-going   PT SHORT TERM GOAL #8    Title Patient will not experience no more than 3/10 pain in her back and L  hip during gait distance of at least 578f with no assistive device    Baseline 7/20- usually less than 3/10 except for flareups    Time 3   Period Weeks   Status Partially Met           PT Long Term Goals - 12/24/14 1614    PT LONG TERM GOAL #1   Title Patient will be able to perform TUG assessment in 15 seconds or less on 3/3 attempts, good safety awareness, and good balance throughout test    Status On-going   PT LONG TERM GOAL #2   Title Patient will be able to ambulate at least 10030fwith no assistive device, good posture, improved gait mechanics with equal step lengths, good clearance of floor, improved weight bearing through L leg, and increased stance time bilaterally with no posterior loss of balance and minimal fatigue    Status On-going   PT LONG TERM GOAL #3   Title Patient will be able to perform rolling, supine to sit, and sit to stand transfer with Mod(I) and good safety awareness, good posture, and high quality movements throughout, minimal fall risk    Status On-going   PT LONG TERM GOAL #4   Title Patient will as a whole demonstrate the ability to perform large, high quality movement patterns on a consistent basis with minimal posterior loss of balance and minimal fall risk in order to maximize her quality of life and ease of motion   Status On-going   PT LONG TERM GOAL #5   Title Patient and her spouse to be educated regarding BIG movement program/school of thought for people with Parkinsons; also to be educated regarding any available support/activity groups in the local area    Status On-going   PT LONG TERM GOAL #6   Title Patient will experience no more than 1/10 pain in her low back during all functional upright weight bearing tasks    PT LONG TERM GOAL #7   Title Patient will demonstrate the abilty to ambulate unlimited distances with no assistive device, minimal fatigue, pain no more  than 1/10 in her low back    PT LONG TERM GOAL #8   Title Patient will more easily be able to independently perform functional activities such as putting on her shoes and socks without assistance from her husband                Plan - 01/04/15 1740    Clinical Impression Statement Treatment focus on improving gait mechanics and balance incorporating BERG balance activities and functional strengthening with stairs and gluteal strengthening in obstacle course.. Marland KitchenPt able to demonstrate all obstacle courses with no LOB episodes noted during session.  Pt reported pain reduced in Lt LE at end of session.   PT Next Visit Plan Reaassess next session        Problem List Patient Active Problem List   Diagnosis Date Noted  . Abnormality of gait 08/12/2014  . Parkinsonism 08/12/2014  . Stroke 08/12/2014  . Muscle weakness (generalized) 02/22/2014  . Stiffness of left knee 02/22/2014  . Hamstring tightness of left lower extremity 02/22/2014  . Pain in joint, pelvic region and thigh 02/22/2014  . Difficulty walking 04/07/2013  . Osteoporosis 03/10/2013  . Anemia 03/10/2013  . Mild malnutrition 03/10/2013  . S/P left hip hemiarthroplasty 12/30/2012  . Osteoporosis with pathological fracture with delayed healing 11/25/2012  . Pedal edema 10/27/2012  . Fracture of sacrum 10/02/2012  . Stricture and  stenosis of esophagus 09/24/2011  . Hyperlipidemia 11/03/2008  . CAD, UNSPECIFIED SITE 11/03/2008   Ihor Austin, Laverne; [place]   Aldona Lento 01/04/2015, 5:51 PM  [facility] [address] [place], [place], [address] Phone: [phone]   Fax:  [phone]

## 2015-01-06 ENCOUNTER — Ambulatory Visit (HOSPITAL_COMMUNITY): Payer: Medicare Other | Admitting: Physical Therapy

## 2015-01-06 DIAGNOSIS — R269 Unspecified abnormalities of gait and mobility: Secondary | ICD-10-CM

## 2015-01-06 DIAGNOSIS — Z9181 History of falling: Secondary | ICD-10-CM

## 2015-01-06 DIAGNOSIS — G20 Parkinson's disease: Secondary | ICD-10-CM

## 2015-01-06 DIAGNOSIS — R531 Weakness: Secondary | ICD-10-CM

## 2015-01-06 DIAGNOSIS — Z7409 Other reduced mobility: Secondary | ICD-10-CM

## 2015-01-06 NOTE — Therapy (Signed)
[facility] [address] [place], [place], [address] Phone: [phone]   Fax:  [phone]  Physical Therapy Treatment  Patient Details  Name: Samantha Hudson MRN: [id number] Date of Birth: 13-Mar-1935 Referring Provider:  Kathyrn Drown, MD  Encounter Date: 01/06/2015      PT End of Session - 01/06/15 1754    Visit Number 33   Number of Visits 33   Authorization Type Medicare   PT Start Time 1600   PT Stop Time 1645   PT Time Calculation (min) 45 min   Equipment Utilized During Treatment Gait belt   Activity Tolerance Patient tolerated treatment well   Behavior During Therapy [facility] for tasks assessed/performed      Past Medical History  Diagnosis Date  . Hyperlipemia   . IBS (irritable bowel syndrome)   . Osteoporosis   . Fibromyalgia   . Glaucoma     HAD LASER SURGERY - NO EYE DROPS REQUIRED  . Globus hystericus   . Fibromyalgia   . Meningioma   . CAD (coronary artery disease)     HEART STENT PLACED ABOUT 2000- NO LONGER SEES CARDIOLOGIST; NO C/O OF CHEST PAINS OR SOB  . Heart murmur     MVP SINCE THE 60'S - TAKES ATENOLOL -  . Anxiety   . Swelling     BILATERAL FEET AND LEGS - ONSET OF SWELLING APRIL 2014  . GERD (gastroesophageal reflux disease)   . Arthritis   . Fracture   . CVA (cerebral vascular accident)     STROKE 7 YRS AGO - DAY AFTER BRAIN SURGERY TO REMOVE BENIGN MENINGIOMA - HAS LEFT SIDED WEAKNESS AND A LITTLE NUMBNESS  . Pain     "EXCRUCIATING PAIN" LEFT HIP - HAS A FRACTURE - IS CONFINED TO W/C AT HOME - NO WEIGHT BEARING LEFT HIP.    Past Surgical History  Procedure Laterality Date  . Brain surgery      tumor removed  . Tonsillectomy    . Partial hysterectomy    . Glaucoma repair    . Cataract extraction    . Kidney surgery      left - HX ENLARGED LEFT KIDNEY ON XRAY - SURGERY WAS DONE ON URETER   . Colonoscopy    . Coronary angioplasty    . Hip arthroplasty Left 12/29/2012    Procedure: LEFT  HIP HEMI ARTHROPLASTY ;  Surgeon: Mauri Pole, MD;  Location: [facility];  Service: Orthopedics;  Laterality: Left;    There were no vitals filed for this visit.  Visit Diagnosis:  Parkinsonism  Generalized weakness  Abnormality of gait  Impaired functional mobility, balance, gait, and endurance  Risk for falls      Subjective Assessment - 01/06/15 1605    Subjective Pt reports that she has noticed improvements in ability to walk around obstacles, which has made her more confident with ambulating in small spaces and crowded spaces. She has been waking up with a lot of pain for the past few days, and she has been trying to do her stretches and exercises to decrease pain, but she still needs to take a pain pill sometimes. She reports that she feels like she has been walking more naturally lately. She still has difficulty reaching down to the floor to pick up objects. She reports that as long as she isn't having pain, she can pretty much do everything she needs to do.    How long can you stand  comfortably? 60 minutes   How long can you walk comfortably? 20 minutes   Currently in Pain? Yes   Pain Score 5    Pain Location Hip   Pain Orientation Left            [facility] PT Assessment - 01/06/15 0001    Strength   Right Knee Flexion 4+/5   Right Knee Extension 5/5   Left Knee Flexion 4+/5   Left Knee Extension 5/5   Right Ankle Dorsiflexion 4+/5   Left Ankle Dorsiflexion 4+/5   Ambulation/Gait   Gait Comments TUG 12.6 seconds                     [facility] Adult PT Treatment/Exercise - 01/06/15 0001    Bed Mobility   Rolling Right 7: Independent   Rolling Left 7: Independent   Supine to Sit 7: Independent   Sit to Supine 7: Independent   Transfers   Sit to Stand 6: Modified independent (Device/Increase time)   Ambulation/Gait   Ambulation/Gait Yes   Ambulation/Gait Assistance 6: Modified independent (Device/Increase time)   Ambulation Distance (Feet) 500 Feet    Assistive device None   Knee/Hip Exercises: Standing   Gait Training gait training on/off steps, over hurdles, around cones x 2 RT                PT Education - 01/06/15 1754    Education provided Yes   Education Details Educated on maintaining HEP, walking program   Person(s) Educated Patient   Methods Explanation   Comprehension Verbalized understanding          PT Short Term Goals - 01/06/15 1757    PT SHORT TERM GOAL #1   Title Patient will demonstrate improved bilateral lower extremity strength to at least 4-/5 in order to improve overall stability and functional mobility skills    Time 3   Period Weeks   Status Achieved   PT SHORT TERM GOAL #2   Title Patient will demonstrate improved gait mechanics and will be able to ambulate at least 414f with rolling walker and cues for improved mechanics and posture no more than 40% of the time, minimal fatigue   Time 3   Period Weeks   Status Achieved   PT SHORT TERM GOAL #3   Title Patient will demonstrate the ability to perform supine <-->sit with supervision and cues for sequencing no more than 20% of the time   Time 3   Period Weeks   Status Achieved   PT SHORT TERM GOAL #4   Title Patient will be able to perform sit to stand transfer with supervision, cues for proper form no more than 20% of the time and no posterior loss of balance    Time 3   Period Weeks   Status Achieved   PT SHORT TERM GOAL #5   Title Patient and her spouse will be independent in correctly and consistently performing appropriate HEP with emphasis on large, high quality movement patterns    Time 3   Period Weeks   Status Achieved   PT SHORT TERM GOAL #6   Title Patient will experience no more than 3/10 pain in her low back and L hip during all weight bearing tasks and activities    Time 3   Period Weeks   Status Achieved   PT SHORT TERM GOAL #7   Title Patient will demonstrate improved spinal mobilty by at least 10 degrees in order to  reduce overall stiffness and reduce pain with mobiltiy    Time 3   Period Weeks   Status Partially Met   PT SHORT TERM GOAL #8   Title Patient will not experience no more than 3/10 pain in her back and L hip during gait distance of at least 561f with no assistive device    Time 3   Period Weeks   Status Achieved           PT Long Term Goals - 01/06/15 1758    PT LONG TERM GOAL #1   Title Patient will be able to perform TUG assessment in 15 seconds or less on 3/3 attempts, good safety awareness, and good balance throughout test    Time 6   Period Weeks   Status Achieved   PT LONG TERM GOAL #2   Title Patient will be able to ambulate at least 10051fwith no assistive device, good posture, improved gait mechanics with equal step lengths, good clearance of floor, improved weight bearing through L leg, and increased stance time bilaterally with no posterior loss of balance and minimal fatigue    Time 6   Period Weeks   Status Partially Met   PT LONG TERM GOAL #3   Title Patient will be able to perform rolling, supine to sit, and sit to stand transfer with Mod(I) and good safety awareness, good posture, and high quality movements throughout, minimal fall risk    Time 6   Period Weeks   Status Achieved   PT LONG TERM GOAL #4   Title Patient will as a whole demonstrate the ability to perform large, high quality movement patterns on a consistent basis with minimal posterior loss of balance and minimal fall risk in order to maximize her quality of life and ease of motion   Baseline 6   Period Weeks   Status Achieved   PT LONG TERM GOAL #5   Title Patient and her spouse to be educated regarding BIG movement program/school of thought for people with Parkinsons; also to be educated regarding any available support/activity groups in the local area    Baseline 8/18- educated regarding parkinsons support group coming up later in the year    Time 6   Period Weeks   Status Achieved   PT LONG  TERM GOAL #6   Title Patient will experience no more than 1/10 pain in her low back during all functional upright weight bearing tasks    Time 6   Period Weeks   Status Achieved   PT LONG TERM GOAL #7   Title Patient will demonstrate the abilty to ambulate unlimited distances with no assistive device, minimal fatigue, pain no more than 1/10 in her low back    Time 6   Period Weeks   Status Partially Met   PT LONG TERM GOAL #8   Title Patient will more easily be able to independently perform functional activities such as putting on her shoes and socks without assistance from her husband    Baseline 8/18- requires assistance for removing compression socks all the way   Time 6   Period Weeks   Status Partially Met               Plan - 01/06/15 1755    Clinical Impression Statement Reassessment completed today. Pt has made excellent progress in regards to balance, gait, functional mobiltiy, and functional activity tolerance. She is more comfortable with ambulating in the community, no longer needs assistance  with bed mobility or transfers, is ambulating without AD, and has demonstrated improved BLE strength and gait mechanics. Pt was able to complete obstacle course during gait training today with no LOB and no verbal cueing needed from PT. Pt has met her LTGs, and is being d/c to HEP.    PT Treatment/Interventions ADLs/Self Care Home Management;Gait training;Neuromuscular re-education;Visual/perceptual remediation/compensation;Stair training;Biofeedback;Functional mobility training;Patient/family education;Passive range of motion;Therapeutic activities;Therapeutic exercise;Manual techniques;Energy conservation;DME Instruction;Balance training          G-Codes - 01-12-2015 1800    Functional Assessment Tool Used FOTO   Functional Limitation Mobility: Walking and moving around   Mobility: Walking and Moving Around Goal Status [phone]) At least 20 percent but less than 40 percent  impaired, limited or restricted   Mobility: Walking and Moving Around Discharge Status [phone]) At least 20 percent but less than 40 percent impaired, limited or restricted      Problem List Patient Active Problem List   Diagnosis Date Noted  . Abnormality of gait 08/12/2014  . Parkinsonism 08/12/2014  . Stroke 08/12/2014  . Muscle weakness (generalized) 02/22/2014  . Stiffness of left knee 02/22/2014  . Hamstring tightness of left lower extremity 02/22/2014  . Pain in joint, pelvic region and thigh 02/22/2014  . Difficulty walking 04/07/2013  . Osteoporosis 03/10/2013  . Anemia 03/10/2013  . Mild malnutrition 03/10/2013  . S/P left hip hemiarthroplasty 12/30/2012  . Osteoporosis with pathological fracture with delayed healing 11/25/2012  . Pedal edema 10/27/2012  . Fracture of sacrum 10/02/2012  . Stricture and stenosis of esophagus 09/24/2011  . Hyperlipidemia 11/03/2008  . CAD, UNSPECIFIED SITE 11/03/2008    PHYSICAL THERAPY DISCHARGE SUMMARY  Visits from Start of Care: 33  Current functional level related to goals / functional outcomes: Pt is currently able to ambulate without AD around obstacles, on/off curbs, and on uneven surface without LOB. She demonstrates improved BLE strength, decreased fall risk, and improved gait speed.    Remaining deficits: Unable to ambulate community distances without RW consistently due to fear of falling.    Education / Equipment: HEP  Plan: Patient agrees to discharge.  Patient goals were partially met. Patient is being discharged due to being pleased with the current functional level.  ?????      Hilma Favors, PT, DPT [phone] 2015-01-12, 6:01 PM  [facility] [address] [place], [place], [address] Phone: [phone]   Fax:  [phone]

## 2015-01-10 ENCOUNTER — Ambulatory Visit (HOSPITAL_COMMUNITY): Payer: Medicare Other | Admitting: Physical Therapy

## 2015-01-12 ENCOUNTER — Encounter (HOSPITAL_COMMUNITY): Payer: Medicare Other | Admitting: Physical Therapy

## 2015-01-15 ENCOUNTER — Emergency Department (HOSPITAL_COMMUNITY): Payer: Medicare Other

## 2015-01-15 ENCOUNTER — Encounter (HOSPITAL_COMMUNITY): Payer: Self-pay | Admitting: Emergency Medicine

## 2015-01-15 ENCOUNTER — Emergency Department (HOSPITAL_COMMUNITY)
Admission: EM | Admit: 2015-01-15 | Discharge: 2015-01-15 | Disposition: A | Discharge status: Home / Self Care | Payer: Medicare Other | Attending: Emergency Medicine | Admitting: Emergency Medicine

## 2015-01-15 DIAGNOSIS — Z8781 Personal history of (healed) traumatic fracture: Secondary | ICD-10-CM | POA: Insufficient documentation

## 2015-01-15 DIAGNOSIS — Z86018 Personal history of other benign neoplasm: Secondary | ICD-10-CM | POA: Diagnosis not present

## 2015-01-15 DIAGNOSIS — Z8669 Personal history of other diseases of the nervous system and sense organs: Secondary | ICD-10-CM | POA: Diagnosis not present

## 2015-01-15 DIAGNOSIS — R1032 Left lower quadrant pain: Secondary | ICD-10-CM | POA: Insufficient documentation

## 2015-01-15 DIAGNOSIS — Z880 Allergy status to penicillin: Secondary | ICD-10-CM | POA: Diagnosis not present

## 2015-01-15 DIAGNOSIS — R319 Hematuria, unspecified: Secondary | ICD-10-CM | POA: Diagnosis present

## 2015-01-15 DIAGNOSIS — M797 Fibromyalgia: Secondary | ICD-10-CM | POA: Insufficient documentation

## 2015-01-15 DIAGNOSIS — F419 Anxiety disorder, unspecified: Secondary | ICD-10-CM | POA: Diagnosis not present

## 2015-01-15 DIAGNOSIS — Z8673 Personal history of transient ischemic attack (TIA), and cerebral infarction without residual deficits: Secondary | ICD-10-CM | POA: Diagnosis not present

## 2015-01-15 DIAGNOSIS — K219 Gastro-esophageal reflux disease without esophagitis: Secondary | ICD-10-CM | POA: Insufficient documentation

## 2015-01-15 DIAGNOSIS — Z79899 Other long term (current) drug therapy: Secondary | ICD-10-CM | POA: Diagnosis not present

## 2015-01-15 DIAGNOSIS — E785 Hyperlipidemia, unspecified: Secondary | ICD-10-CM | POA: Insufficient documentation

## 2015-01-15 DIAGNOSIS — R011 Cardiac murmur, unspecified: Secondary | ICD-10-CM | POA: Insufficient documentation

## 2015-01-15 DIAGNOSIS — M1990 Unspecified osteoarthritis, unspecified site: Secondary | ICD-10-CM | POA: Insufficient documentation

## 2015-01-15 DIAGNOSIS — I2510 Atherosclerotic heart disease of native coronary artery without angina pectoris: Secondary | ICD-10-CM | POA: Insufficient documentation

## 2015-01-15 DIAGNOSIS — N3091 Cystitis, unspecified with hematuria: Secondary | ICD-10-CM

## 2015-01-15 LAB — CBC WITH DIFFERENTIAL/PLATELET
Basophils Absolute: 0 10*3/uL (ref 0.0–0.1)
Basophils Relative: 0 % (ref 0–1)
Eosinophils Absolute: 0.1 10*3/uL (ref 0.0–0.7)
Eosinophils Relative: 1 % (ref 0–5)
HEMATOCRIT: 34.8 % — AB (ref 36.0–46.0)
HEMOGLOBIN: 11.4 g/dL — AB (ref 12.0–15.0)
Lymphocytes Relative: 18 % (ref 12–46)
Lymphs Abs: 1.4 10*3/uL (ref 0.7–4.0)
MCH: 30.6 pg (ref 26.0–34.0)
MCHC: 32.8 g/dL (ref 30.0–36.0)
MCV: 93.3 fL (ref 78.0–100.0)
Monocytes Absolute: 0.7 10*3/uL (ref 0.1–1.0)
Monocytes Relative: 9 % (ref 3–12)
NEUTROS ABS: 5.7 10*3/uL (ref 1.7–7.7)
NEUTROS PCT: 72 % (ref 43–77)
Platelets: 185 10*3/uL (ref 150–400)
RBC: 3.73 MIL/uL — ABNORMAL LOW (ref 3.87–5.11)
RDW: 12.8 % (ref 11.5–15.5)
WBC: 7.8 10*3/uL (ref 4.0–10.5)

## 2015-01-15 LAB — BASIC METABOLIC PANEL
ANION GAP: 4 — AB (ref 5–15)
BUN: 30 mg/dL — ABNORMAL HIGH (ref 6–20)
CHLORIDE: 101 mmol/L (ref 101–111)
CO2: 32 mmol/L (ref 22–32)
CREATININE: 0.91 mg/dL (ref 0.44–1.00)
Calcium: 9.5 mg/dL (ref 8.9–10.3)
GFR calc non Af Amer: 59 mL/min — ABNORMAL LOW (ref >60)
Glucose, Bld: 91 mg/dL (ref 65–99)
POTASSIUM: 4.1 mmol/L (ref 3.5–5.1)
Sodium: 137 mmol/L (ref 135–145)

## 2015-01-15 LAB — URINE MICROSCOPIC-ADD ON

## 2015-01-15 LAB — URINALYSIS, ROUTINE W REFLEX MICROSCOPIC
Bilirubin Urine: NEGATIVE
Glucose, UA: NEGATIVE mg/dL
NITRITE: NEGATIVE
Specific Gravity, Urine: 1.025 (ref 1.005–1.030)
UROBILINOGEN UA: 0.2 mg/dL (ref 0.0–1.0)
pH: 6.5 (ref 5.0–8.0)

## 2015-01-15 MED ORDER — DEXTROSE 5 % IV SOLN
1.0000 g | Freq: Once | INTRAVENOUS | Status: AC
  Administered 2015-01-15: 1 g via INTRAVENOUS
  Filled 2015-01-15: qty 10

## 2015-01-15 MED ORDER — CEPHALEXIN 500 MG PO CAPS: 500.0000 mg | ORAL_CAPSULE | Freq: Four times a day (QID) | ORAL | Status: DC

## 2015-01-15 NOTE — ED Provider Notes (Signed)
CSN: [id number]     Arrival date & time 01/15/15  1756 History   First MD Initiated Contact with Patient 01/15/15 1804     Chief Complaint  Patient presents with  . Hematuria     (Consider location/radiation/quality/duration/timing/severity/associated sxs/prior Treatment) HPI Comments: Patient presents to the ER for evaluation of left-sided abdominal and flank pain with hematuria. Symptoms began earlier today. She has not noticed any dysuria associated with her symptoms. There is no fever, nausea or vomiting. Patient reports that pain is constant, moderate.  Patient is a 80 y.o. female presenting with hematuria.  Hematuria    Past Medical History  Diagnosis Date  . Hyperlipemia   . IBS (irritable bowel syndrome)   . Osteoporosis   . Fibromyalgia   . Glaucoma     HAD LASER SURGERY - NO EYE DROPS REQUIRED  . Globus hystericus   . Fibromyalgia   . Meningioma   . CAD (coronary artery disease)     HEART STENT PLACED ABOUT 2000- NO LONGER SEES CARDIOLOGIST; NO C/O OF CHEST PAINS OR SOB  . Heart murmur     MVP SINCE THE 60'S - TAKES ATENOLOL -  . Anxiety   . Swelling     BILATERAL FEET AND LEGS - ONSET OF SWELLING APRIL 2014  . GERD (gastroesophageal reflux disease)   . Arthritis   . Fracture   . CVA (cerebral vascular accident)     STROKE 7 YRS AGO - DAY AFTER BRAIN SURGERY TO REMOVE BENIGN MENINGIOMA - HAS LEFT SIDED WEAKNESS AND A LITTLE NUMBNESS  . Pain     "EXCRUCIATING PAIN" LEFT HIP - HAS A FRACTURE - IS CONFINED TO W/C AT HOME - NO WEIGHT BEARING LEFT HIP.   Past Surgical History  Procedure Laterality Date  . Brain surgery      tumor removed  . Tonsillectomy    . Partial hysterectomy    . Glaucoma repair    . Cataract extraction    . Kidney surgery      left - HX ENLARGED LEFT KIDNEY ON XRAY - SURGERY WAS DONE ON URETER   . Colonoscopy    . Coronary angioplasty    . Hip arthroplasty Left 12/29/2012    Procedure: LEFT HIP HEMI ARTHROPLASTY ;  Surgeon: Mauri Pole, MD;  Location: [facility];  Service: Orthopedics;  Laterality: Left;   Family History  Problem Relation Age of Onset  . Liver disease Father   . Diabetes Father   . Coronary artery disease Mother     deseased  . Heart attack Mother   . Hyperlipidemia Mother   . Breast cancer Sister     x 2  . Cancer Sister     breast   Social History  Substance Use Topics  . Smoking status: Never Smoker   . Smokeless tobacco: Never Used  . Alcohol Use: No   OB History    Gravida Para Term Preterm AB TAB SAB Ectopic Multiple Living   2 2 2       2      Review of Systems  Genitourinary: Positive for hematuria and flank pain.  All other systems reviewed and are negative.     Allergies  Lactose intolerance (gi) and Penicillins  Home Medications   Prior to Admission medications   Medication Sig Start Date End Date Taking? Authorizing Provider  atenolol (TENORMIN) 25 MG tablet TAKE (1) TABLET TWICE A DAY. 09/27/14   Kathyrn Drown, MD  atorvastatin (LIPITOR)  10 MG tablet TAKE 1 TABLET AT BEDTIME. 10/25/14   Kathyrn Drown, MD  calcium-vitamin D (OSCAL WITH D) 500-200 MG-UNIT per tablet Take 1 tablet by mouth.    Historical Provider, MD  carbidopa-levodopa (SINEMET IR) 25-100 MG per tablet Take 2 tablets by mouth 3 (three) times daily. 11/11/14   Marcial Pacas, MD  celecoxib (CELEBREX) 100 MG capsule Take 1 capsule (100 mg total) by mouth 2 (two) times daily as needed. 11/11/14   Marcial Pacas, MD  cholecalciferol (VITAMIN D) 400 UNITS TABS tablet Take 400 Units by mouth daily.    Historical Provider, MD  dipyridamole-aspirin (AGGRENOX) 200-25 MG per 12 hr capsule TAKE  (1)  CAPSULE  TWICE DAILY. 03/30/14   Kathyrn Drown, MD  DULoxetine (CYMBALTA) 30 MG capsule TAKE (1) CAPSULE DAILY. 03/30/14   Kathyrn Drown, MD  gabapentin (NEURONTIN) 100 MG capsule Take 2 in the am and 1 in the afternoon 11/02/14   Kathyrn Drown, MD  gabapentin (NEURONTIN) 400 MG capsule Take 400 mg by mouth at bedtime.     Historical Provider, MD  polyethylene glycol powder (GLYCOLAX/MIRALAX) powder Take 17 g by mouth 2 (two) times daily. 01/01/13   Danae Orleans, PA-C  potassium chloride (K-DUR) 10 MEQ tablet TAKE (1) TABLET TWICE A DAY. 03/30/14   Kathyrn Drown, MD  ranitidine (ZANTAC) 300 MG tablet TAKE (1) TABLET DAILY AS DIRECTED. 03/30/14   Kathyrn Drown, MD  rasagiline (AZILECT) 1 MG TABS tablet Take 1 tablet (1 mg total) by mouth daily. 11/11/14   Marcial Pacas, MD  torsemide (DEMADEX) 20 MG tablet Take one half qam 03/30/14   Kathyrn Drown, MD   BP 125/53 mmHg  Pulse 73  Temp(Src) 97.4 F (36.3 C) (Oral)  Resp 20  Ht 5' 5.5" (1.664 m)  Wt 105 lb (47.628 kg)  BMI 17.20 kg/m2  SpO2 100% Physical Exam  Constitutional: She is oriented to person, place, and time. She appears well-developed and well-nourished. No distress.  HENT:  Head: Normocephalic and atraumatic.  Right Ear: Hearing normal.  Left Ear: Hearing normal.  Nose: Nose normal.  Mouth/Throat: Oropharynx is clear and moist and mucous membranes are normal.  Eyes: Conjunctivae and EOM are normal. Pupils are equal, round, and reactive to light.  Neck: Normal range of motion. Neck supple.  Cardiovascular: Regular rhythm, S1 normal and S2 normal.  Exam reveals no gallop and no friction rub.   No murmur heard. Pulmonary/Chest: Effort normal and breath sounds normal. No respiratory distress. She exhibits no tenderness.  Abdominal: Soft. Normal appearance and bowel sounds are normal. There is no hepatosplenomegaly. There is tenderness in the suprapubic area and left lower quadrant. There is no rebound, no guarding, no tenderness at McBurney's point and negative Murphy's sign. No hernia.  Musculoskeletal: Normal range of motion.  Neurological: She is alert and oriented to person, place, and time. She has normal strength. No cranial nerve deficit or sensory deficit. Coordination normal. GCS eye subscore is 4. GCS verbal subscore is 5. GCS motor  subscore is 6.  Skin: Skin is warm, dry and intact. No rash noted. No cyanosis.  Psychiatric: She has a normal mood and affect. Her speech is normal and behavior is normal. Thought content normal.  Nursing note and vitals reviewed.   ED Course  Procedures (including critical care time) Labs Review Labs Reviewed  CBC WITH DIFFERENTIAL/PLATELET  BASIC METABOLIC PANEL  URINALYSIS, ROUTINE W REFLEX MICROSCOPIC (NOT AT [facility])    Imaging  Review No results found. I have personally reviewed and evaluated these images and lab results as part of my medical decision-making.   EKG Interpretation None      MDM   Final diagnoses:  None   hemorrhagic cystitis  Patient presents to the emergency part for evaluation of urinary frequency and hematuria. Patient complaining of pain in the left lower abdomen and pelvic area as well. She had mild tenderness in this region without guarding or rebound. Urinalysis suggest infection. CT scan performed, no evidence of diverticulitis or ureterolithiasis. Patient initiated on Rocephin. She will continue Keflex at home. She does take Aggrenox because of a history of stroke, will hold for 2 days.    Orpah Greek, MD 01/15/15 [phone]

## 2015-01-15 NOTE — Discharge Instructions (Signed)

## 2015-01-15 NOTE — ED Notes (Signed)
Pt repors L side abdominal pain and hematuria that started today.

## 2015-01-17 ENCOUNTER — Encounter (HOSPITAL_COMMUNITY): Payer: Medicare Other | Admitting: Physical Therapy

## 2015-01-17 LAB — URINE CULTURE: Culture: 8000

## 2015-01-18 ENCOUNTER — Encounter: Payer: Self-pay | Admitting: Family Medicine

## 2015-01-18 ENCOUNTER — Ambulatory Visit (INDEPENDENT_AMBULATORY_CARE_PROVIDER_SITE_OTHER): Payer: Medicare Other | Admitting: Family Medicine

## 2015-01-18 VITALS — BP 120/68 | Temp 98.4°F | Ht 66.0 in | Wt 105.4 lb

## 2015-01-18 DIAGNOSIS — I639 Cerebral infarction, unspecified: Secondary | ICD-10-CM | POA: Diagnosis not present

## 2015-01-18 DIAGNOSIS — N3000 Acute cystitis without hematuria: Secondary | ICD-10-CM

## 2015-01-18 DIAGNOSIS — R350 Frequency of micturition: Secondary | ICD-10-CM | POA: Diagnosis not present

## 2015-01-18 LAB — POCT URINALYSIS DIPSTICK
SPEC GRAV UA: 1.02
pH, UA: 6

## 2015-01-18 NOTE — Progress Notes (Signed)
   Subjective:    Patient ID: Samantha Hudson, female    DOB: June 16, 1934, 80 y.o.   MRN: [id number]  Urinary Tract Infection  This is a new problem. The current episode started in the past 7 days. The problem has been rapidly improving. Treatments tried: Keflex.   Patient in today for a hospital follow up for UTI. Patient recently seen at ER on 01/15/15. Currently being treated with Keflex. States symptoms are rapidly improving. Patient states no other concerns at this time.  She denies fever chills sweats nausea or dizziness  Review of Systems     Objective:   Physical Exam Lungs are clear hearts regular extremities no edema       Assessment & Plan:  Her potassium in the ER was good. I recommend reducing her potassium to just one per day. She is currently taking torsemide just 3 days a week  UTI seems to be getting better were Flexed continue current antibiotics follow-up in September

## 2015-01-19 ENCOUNTER — Encounter (HOSPITAL_COMMUNITY): Payer: Medicare Other | Admitting: Physical Therapy

## 2015-01-21 ENCOUNTER — Other Ambulatory Visit: Payer: Self-pay | Admitting: Family Medicine

## 2015-02-15 ENCOUNTER — Ambulatory Visit (INDEPENDENT_AMBULATORY_CARE_PROVIDER_SITE_OTHER): Payer: Medicare Other | Admitting: Family Medicine

## 2015-02-15 ENCOUNTER — Encounter: Payer: Self-pay | Admitting: Family Medicine

## 2015-02-15 VITALS — BP 102/70 | Ht 66.0 in | Wt 103.2 lb

## 2015-02-15 DIAGNOSIS — E785 Hyperlipidemia, unspecified: Secondary | ICD-10-CM | POA: Diagnosis not present

## 2015-02-15 DIAGNOSIS — I951 Orthostatic hypotension: Secondary | ICD-10-CM | POA: Diagnosis not present

## 2015-02-15 DIAGNOSIS — I639 Cerebral infarction, unspecified: Secondary | ICD-10-CM

## 2015-02-15 DIAGNOSIS — Z79899 Other long term (current) drug therapy: Secondary | ICD-10-CM | POA: Diagnosis not present

## 2015-02-15 NOTE — Progress Notes (Signed)
   Subjective:    Patient ID: Samantha Hudson, female    DOB: 1934-08-24, 80 y.o.   MRN: [id number]  Hyperlipidemia This is a chronic problem. The current episode started more than 1 year ago. There are no compliance problems.   patient does take medication she denies a causing any problems Blood pressure under decent control recently. But she did have one spell for which she felt woozy and she literally passed out on the side of the bed at her home she was not standing at the time she was sitting on the edge of her bed. There is been no other instances of this occurring Her neuropathy in her legs seem to be doing good with taking the Neurontin She's not been having any mood problems lately Cymbalta been doing a good job Shedenies any swelling recently. No constipation issues no vomiting or diarrhea. No wheezing or difficulty breathing. Appetite is been good.  Patient states no concerns this visit.  Review of Systems     Objective:   Physical Exam Neck no masses weight stable lungs clear no crackles heart regular pulse normal BP good intervention be noted that the patient's blood pressure is somewhat low sitting and standing although she seems to be holding her own slight reduction in blood pressure when she stands  Greater than 25 minutes spent with patient discussing multiple different issues     Assessment & Plan:  Slight orthostatic hypotension I encouraged patient to use more salt in her diet. Follow-up again recheck this again within several weeksif not doing better Patient with neuropathy in the legs stable taking gabapentin seems to be helping continue this Moods overall doing well continue Cymbalta Patient hyperlipidemia check lipid liver profile Tachycardia arrhythmia-continue Tenormin. This should keep the heart rate under control I don't feel it's causing significant blood pressure issues Osteoporosis continue calcium and vitamin D and also yearly reclast  Recent passing out  spell I cannot rule out the possibility of a seizure but there was no witnessed seizure activity by the husband. I don't recommend EEG. I would recommend this patient needs further dilation should this recur again. We will reduce Tenormin half a tablet twice daily. Patient was encouraged to use some salt to help with blood pressure and to be very careful upon standing in getting up from the bed.

## 2015-02-16 ENCOUNTER — Telehealth: Payer: Self-pay

## 2015-02-16 NOTE — Telephone Encounter (Signed)
Notified patient Samantha Hudson) that after they had left Dr. Nicki Reaper had reviewed over their information and now he recommends reducing atenolol from 25 mg twice daily to a half of a tablet of the 25 mg twice daily. If any problems please let us know. The purpose of this is to try to diminish orthostatic hypotension. Patient verbalized understanding.

## 2015-02-16 NOTE — Telephone Encounter (Signed)
-----   Message from Kathyrn Drown, MD sent at 02/15/2015  6:40 PM EDT ----- Please let the patient's husband, Konrad Dolores, know that after they had left I had reviewed over their information and now I recommend reducing atenolol from 25 mg twice daily to a half of a tablet of the 25 mg twice daily. If any problems please let us know. The purpose of this is to try to diminish orthostatic hypotension. Please documented in the chart that this was communicated to the husband

## 2015-02-19 LAB — HEPATIC FUNCTION PANEL
ALBUMIN: 4.1 g/dL (ref 3.5–4.8)
ALK PHOS: 48 IU/L (ref 39–117)
ALT: 5 IU/L (ref 0–32)
AST: 19 IU/L (ref 0–40)
Bilirubin Total: 0.5 mg/dL (ref 0.0–1.2)
Bilirubin, Direct: 0.13 mg/dL (ref 0.00–0.40)
Total Protein: 6.8 g/dL (ref 6.0–8.5)

## 2015-02-19 LAB — LIPID PANEL
CHOLESTEROL TOTAL: 155 mg/dL (ref 100–199)
Chol/HDL Ratio: 2.2 ratio units (ref 0.0–4.4)
HDL: 70 mg/dL (ref >39)
LDL CALC: 74 mg/dL (ref 0–99)
Triglycerides: 54 mg/dL (ref 0–149)
VLDL CHOLESTEROL CAL: 11 mg/dL (ref 5–40)

## 2015-02-20 ENCOUNTER — Encounter: Payer: Self-pay | Admitting: Family Medicine

## 2015-03-02 ENCOUNTER — Emergency Department (HOSPITAL_COMMUNITY)
Admission: EM | Admit: 2015-03-02 | Discharge: 2015-03-02 | Disposition: A | Discharge status: Home / Self Care | Payer: Medicare Other | Attending: Emergency Medicine | Admitting: Emergency Medicine

## 2015-03-02 ENCOUNTER — Emergency Department (HOSPITAL_COMMUNITY): Payer: Medicare Other

## 2015-03-02 ENCOUNTER — Encounter (HOSPITAL_COMMUNITY): Payer: Self-pay

## 2015-03-02 DIAGNOSIS — E785 Hyperlipidemia, unspecified: Secondary | ICD-10-CM | POA: Insufficient documentation

## 2015-03-02 DIAGNOSIS — R61 Generalized hyperhidrosis: Secondary | ICD-10-CM | POA: Insufficient documentation

## 2015-03-02 DIAGNOSIS — F419 Anxiety disorder, unspecified: Secondary | ICD-10-CM | POA: Insufficient documentation

## 2015-03-02 DIAGNOSIS — K589 Irritable bowel syndrome without diarrhea: Secondary | ICD-10-CM | POA: Insufficient documentation

## 2015-03-02 DIAGNOSIS — R011 Cardiac murmur, unspecified: Secondary | ICD-10-CM | POA: Insufficient documentation

## 2015-03-02 DIAGNOSIS — Z85841 Personal history of malignant neoplasm of brain: Secondary | ICD-10-CM | POA: Insufficient documentation

## 2015-03-02 DIAGNOSIS — Z79899 Other long term (current) drug therapy: Secondary | ICD-10-CM | POA: Diagnosis not present

## 2015-03-02 DIAGNOSIS — M8088XA Other osteoporosis with current pathological fracture, vertebra(e), initial encounter for fracture: Secondary | ICD-10-CM | POA: Insufficient documentation

## 2015-03-02 DIAGNOSIS — I2510 Atherosclerotic heart disease of native coronary artery without angina pectoris: Secondary | ICD-10-CM | POA: Diagnosis not present

## 2015-03-02 DIAGNOSIS — K219 Gastro-esophageal reflux disease without esophagitis: Secondary | ICD-10-CM | POA: Insufficient documentation

## 2015-03-02 DIAGNOSIS — R42 Dizziness and giddiness: Secondary | ICD-10-CM | POA: Diagnosis present

## 2015-03-02 DIAGNOSIS — Z8673 Personal history of transient ischemic attack (TIA), and cerebral infarction without residual deficits: Secondary | ICD-10-CM | POA: Diagnosis not present

## 2015-03-02 DIAGNOSIS — M797 Fibromyalgia: Secondary | ICD-10-CM | POA: Insufficient documentation

## 2015-03-02 DIAGNOSIS — R531 Weakness: Secondary | ICD-10-CM | POA: Diagnosis not present

## 2015-03-02 DIAGNOSIS — G20 Parkinson's disease: Secondary | ICD-10-CM | POA: Diagnosis not present

## 2015-03-02 DIAGNOSIS — M1990 Unspecified osteoarthritis, unspecified site: Secondary | ICD-10-CM | POA: Diagnosis not present

## 2015-03-02 DIAGNOSIS — M4854XA Collapsed vertebra, not elsewhere classified, thoracic region, initial encounter for fracture: Secondary | ICD-10-CM

## 2015-03-02 DIAGNOSIS — Z880 Allergy status to penicillin: Secondary | ICD-10-CM | POA: Insufficient documentation

## 2015-03-02 DIAGNOSIS — S22000A Wedge compression fracture of unspecified thoracic vertebra, initial encounter for closed fracture: Secondary | ICD-10-CM

## 2015-03-02 HISTORY — DX: Parkinson's disease: G20

## 2015-03-02 HISTORY — DX: Parkinson's disease without dyskinesia, without mention of fluctuations: G20.A1

## 2015-03-02 LAB — I-STAT TROPONIN, ED
TROPONIN I, POC: 0 ng/mL (ref 0.00–0.08)
Troponin i, poc: 0 ng/mL (ref 0.00–0.08)

## 2015-03-02 LAB — CBC WITH DIFFERENTIAL/PLATELET
BASOS ABS: 0 10*3/uL (ref 0.0–0.1)
Basophils Relative: 0 %
Eosinophils Absolute: 0.1 10*3/uL (ref 0.0–0.7)
Eosinophils Relative: 1 %
HEMATOCRIT: 35 % — AB (ref 36.0–46.0)
Hemoglobin: 11.2 g/dL — ABNORMAL LOW (ref 12.0–15.0)
Lymphocytes Relative: 14 %
Lymphs Abs: 0.9 10*3/uL (ref 0.7–4.0)
MCH: 30.1 pg (ref 26.0–34.0)
MCHC: 32 g/dL (ref 30.0–36.0)
MCV: 94.1 fL (ref 78.0–100.0)
Monocytes Absolute: 0.6 10*3/uL (ref 0.1–1.0)
Monocytes Relative: 8 %
NEUTROS ABS: 5.1 10*3/uL (ref 1.7–7.7)
NEUTROS PCT: 77 %
Platelets: 186 10*3/uL (ref 150–400)
RBC: 3.72 MIL/uL — AB (ref 3.87–5.11)
RDW: 13.1 % (ref 11.5–15.5)
WBC: 6.6 10*3/uL (ref 4.0–10.5)

## 2015-03-02 LAB — URINALYSIS, ROUTINE W REFLEX MICROSCOPIC
Bilirubin Urine: NEGATIVE
Glucose, UA: NEGATIVE mg/dL
Hgb urine dipstick: NEGATIVE
Ketones, ur: NEGATIVE mg/dL
LEUKOCYTES UA: NEGATIVE
Nitrite: NEGATIVE
PROTEIN: NEGATIVE mg/dL
Specific Gravity, Urine: 1.015 (ref 1.005–1.030)
Urobilinogen, UA: 0.2 mg/dL (ref 0.0–1.0)
pH: 7 (ref 5.0–8.0)

## 2015-03-02 LAB — BASIC METABOLIC PANEL
ANION GAP: 5 (ref 5–15)
BUN: 26 mg/dL — ABNORMAL HIGH (ref 6–20)
CO2: 30 mmol/L (ref 22–32)
Calcium: 9.6 mg/dL (ref 8.9–10.3)
Chloride: 103 mmol/L (ref 101–111)
Creatinine, Ser: 1 mg/dL (ref 0.44–1.00)
GFR calc non Af Amer: 53 mL/min — ABNORMAL LOW (ref >60)
Glucose, Bld: 113 mg/dL — ABNORMAL HIGH (ref 65–99)
Potassium: 4.2 mmol/L (ref 3.5–5.1)
Sodium: 138 mmol/L (ref 135–145)

## 2015-03-02 MED ORDER — IOHEXOL 350 MG/ML SOLN
100.0000 mL | Freq: Once | INTRAVENOUS | Status: AC | PRN
  Administered 2015-03-02: 100 mL via INTRAVENOUS

## 2015-03-02 MED ORDER — SODIUM CHLORIDE 0.9 % IV BOLUS (SEPSIS)
1000.0000 mL | Freq: Once | INTRAVENOUS | Status: AC
  Administered 2015-03-02: 1000 mL via INTRAVENOUS

## 2015-03-02 NOTE — ED Notes (Signed)
Brought in by EMS. Reports eating at [facility] [employer] and began feeling very weak and felt like she was going to pass out, but did not lose consciousness. During the time a pain started between her shoulder blade. Diaphoretic at time that EMS arrived. Denies CP. No distress noted. Equal grip strength

## 2015-03-02 NOTE — ED Provider Notes (Signed)
CSN: [id number]     Arrival date & time 03/02/15  1219 History   First MD Initiated Contact with Patient 03/02/15 1327     Chief Complaint  Patient presents with  . Weakness     (Consider location/radiation/quality/duration/timing/severity/associated sxs/prior Treatment) HPI Comments: 80 year old woman with a past medical history including Parkinson's, CVA with left-sided weakness, HTN, HLD, GERD, CAD who p/w weakness. Pt was sitting at breakfast when she had a sudden onset of generalized weakness and lightheadedness like she was going to pass out. She never lost consciousness but does endorse mild shortness of breath and diaphoresis during the episode. She also notes a pain between her shoulder blades which is now mild. She denies any anterior chest pain. Her symptoms lasted for several minutes and they decided to call EMS. EMS noted diaphoresis when they arrived. Currently, she states that all these symptoms have improved. She denies any headache, visual changes, vertigo symptoms, or new extremity numbness/weakness. She has chronic left-sided weakness from an old stroke. No recent illness, vomiting, fevers, diarrhea, or abdominal pain. No urinary symptoms. A few weeks ago, her dose of atenolol was decreased but she denies any other medication changes recently. This is a pleasant 80 year old female who presents with an episode of  Patient is a 80 y.o. female presenting with weakness. The history is provided by the patient.  Weakness    Past Medical History  Diagnosis Date  . Hyperlipemia   . IBS (irritable bowel syndrome)   . Osteoporosis   . Fibromyalgia   . Glaucoma     HAD LASER SURGERY - NO EYE DROPS REQUIRED  . Globus hystericus   . Fibromyalgia   . Meningioma ([place])   . CAD (coronary artery disease)     HEART STENT PLACED ABOUT 2000- NO LONGER SEES CARDIOLOGIST; NO C/O OF CHEST PAINS OR SOB  . Heart murmur     MVP SINCE THE 60'S - TAKES ATENOLOL -  . Anxiety   . Swelling    BILATERAL FEET AND LEGS - ONSET OF SWELLING APRIL 2014  . GERD (gastroesophageal reflux disease)   . Arthritis   . Fracture   . CVA (cerebral vascular accident) ([place])     STROKE [place] TO REMOVE BENIGN MENINGIOMA - HAS LEFT SIDED WEAKNESS AND A Remer Couse NUMBNESS  . Pain     "EXCRUCIATING PAIN" LEFT HIP - HAS A FRACTURE - IS CONFINED TO W/C AT HOME - NO WEIGHT BEARING LEFT HIP.  Marland Kitchen Parkinson's disease [facility])    Past Surgical History  Procedure Laterality Date  . Brain surgery      tumor removed  . Tonsillectomy    . Partial hysterectomy    . Glaucoma repair    . Cataract extraction    . Kidney surgery      left - HX ENLARGED LEFT KIDNEY ON XRAY - SURGERY WAS DONE ON URETER   . Colonoscopy    . Coronary angioplasty    . Hip arthroplasty Left 12/29/2012    Procedure: LEFT HIP HEMI ARTHROPLASTY ;  Surgeon: Mauri Pole, MD;  Location: [facility];  Service: Orthopedics;  Laterality: Left;   Family History  Problem Relation Age of Onset  . Liver disease Father   . Diabetes Father   . Coronary artery disease Mother     deseased  . Heart attack Mother   . Hyperlipidemia Mother   . Breast cancer Sister     x 2  .  Cancer Sister     breast   Social History  Substance Use Topics  . Smoking status: Never Smoker   . Smokeless tobacco: Never Used  . Alcohol Use: No   OB History    Gravida Para Term Preterm AB TAB SAB Ectopic Multiple Living   2 2 2       2      Review of Systems  Neurological: Positive for weakness.    10 Systems reviewed and are negative for acute change except as noted in the HPI.   Allergies  Lactose intolerance (gi) and Penicillins  Home Medications   Prior to Admission medications   Medication Sig Start Date End Date Taking? Authorizing Provider  atenolol (TENORMIN) 25 MG tablet TAKE (1) TABLET TWICE A DAY. Patient taking differently: TAKE 1/2 TABLET TWICE A DAY. 09/27/14  Yes Kathyrn Drown, MD  atorvastatin (LIPITOR) 10 MG  tablet TAKE 1 TABLET AT BEDTIME. 01/21/15  Yes Kathyrn Drown, MD  calcium-vitamin D (OSCAL WITH D) 500-200 MG-UNIT per tablet Take 1 tablet by mouth 3 (three) times daily.    Yes Historical Provider, MD  carbidopa-levodopa (SINEMET IR) 25-100 MG per tablet Take 2 tablets by mouth 3 (three) times daily. 11/11/14  Yes Marcial Pacas, MD  cholecalciferol (VITAMIN D) 400 UNITS TABS tablet Take 400 Units by mouth daily.   Yes Historical Provider, MD  dipyridamole-aspirin (AGGRENOX) 200-25 MG per 12 hr capsule TAKE  (1)  CAPSULE  TWICE DAILY. 03/30/14  Yes Kathyrn Drown, MD  DULoxetine (CYMBALTA) 30 MG capsule TAKE (1) CAPSULE DAILY. 03/30/14  Yes Kathyrn Drown, MD  gabapentin (NEURONTIN) 100 MG capsule Take 2 in the am and 1 in the afternoon Patient taking differently: Take 100-200 mg by mouth 2 (two) times daily. Take 2 in the am and 1 in the afternoon 11/02/14  Yes Kathyrn Drown, MD  gabapentin (NEURONTIN) 400 MG capsule Take 400 mg by mouth at bedtime.   Yes Historical Provider, MD  hydroxypropyl methylcellulose / hypromellose (ISOPTO TEARS / GONIOVISC) 2.5 % ophthalmic solution Place 1 drop into both eyes as needed for dry eyes.   Yes Historical Provider, MD  polyethylene glycol powder (GLYCOLAX/MIRALAX) powder Take 17 g by mouth 2 (two) times daily. Patient taking differently: Take 17 g by mouth daily as needed for mild constipation.  01/01/13  Yes Matthew Babish, PA-C  potassium chloride (K-DUR) 10 MEQ tablet TAKE (1) TABLET TWICE A DAY. 03/30/14  Yes Kathyrn Drown, MD  ranitidine (ZANTAC) 300 MG tablet TAKE (1) TABLET DAILY AS DIRECTED. Patient taking differently: Take 300 mg by mouth daily.  03/30/14  Yes Kathyrn Drown, MD  torsemide (DEMADEX) 20 MG tablet Take one half qam Patient taking differently: Take 10 mg by mouth daily as needed ([place]).  03/30/14  Yes Kathyrn Drown, MD  traMADol (ULTRAM) 50 MG tablet Take 50 mg by mouth every 6 (six) hours as needed for moderate pain.   Yes Historical  Provider, MD  zoledronic acid (RECLAST) 5 MG/100ML SOLN injection Inject 5 mg into the vein once. Takes yearly in December   Yes Historical Provider, MD   BP 127/88 mmHg  Pulse 80  Temp(Src) 97.5 F (36.4 C) (Oral)  Resp 17  Ht 5\' 5"  (1.651 m)  Wt 105 lb (47.628 kg)  BMI 17.47 kg/m2  SpO2 100% Physical Exam  Constitutional: She is oriented to person, place, and time. She appears well-developed and well-nourished. No distress.  HENT:  Head: Normocephalic  and atraumatic.  Mouth/Throat: Oropharynx is clear and moist.  Moist mucous membranes  Eyes: Conjunctivae are normal. Pupils are equal, round, and reactive to light.  Neck: Neck supple.  Cardiovascular: Normal rate, regular rhythm and normal heart sounds.   No murmur heard. Pulmonary/Chest: Effort normal and breath sounds normal.  Abdominal: Soft. Bowel sounds are normal. She exhibits no distension. There is no tenderness.  Musculoskeletal: She exhibits no edema.  Neurological: She is alert and oriented to person, place, and time. No cranial nerve deficit.  Fluent speech, fine resting tremor, 4/4 strength L side, 5/5 strength R side, normal sensation throughout  Skin: Skin is warm and dry.  Psychiatric: She has a normal mood and affect. Judgment normal.  Nursing note and vitals reviewed.   ED Course  Procedures (including critical care time) Labs Review Labs Reviewed  BASIC METABOLIC PANEL - Abnormal; Notable for the following:    Glucose, Bld 113 (*)    BUN 26 (*)    GFR calc non Af Amer 53 (*)    All other components within normal limits  CBC WITH DIFFERENTIAL/PLATELET - Abnormal; Notable for the following:    RBC 3.72 (*)    Hemoglobin 11.2 (*)    HCT 35.0 (*)    All other components within normal limits  URINALYSIS, ROUTINE W REFLEX MICROSCOPIC (NOT AT [facility])  Randolm Idol, ED  Randolm Idol, ED    Imaging Review Dg Chest 2 View  03/02/2015  CLINICAL DATA:  Posterior chest pain. Shortness of breath.  Lightheadedness. Diaphoresis. EXAM: CHEST  2 VIEW COMPARISON:  Chest x-rays dated 12/31/2012 and thoracic radiographs dated 07/06/2011 FINDINGS: There is a is new severe compression fracture of T7. There is an old compression fracture of the inferior endplate of T8. There is marked osteopenia. Heart size and pulmonary vascularity are normal. Lungs are hyperinflated consistent with emphysema. Scarring at both lung apices. Tortuosity and calcification of the thoracic aorta. Thoracolumbar scoliosis. The compression fracture of T7 is new since 2013 IMPRESSION: 1. New severe compression fracture of T7 since 2013. 2. Old compression fracture of T8. 3. Severe osteopenia. 4. COPD. 5. Aortic atherosclerosis. Electronically Signed   By: Lorriane Shire M.D.   On: 03/02/2015 14:27   Ct T-spine No Charge  03/02/2015  CLINICAL DATA:  Back pain between shoulder blades. Thoracic spine compression fracture. EXAM: CT THORACIC SPINE WITHOUT CONTRAST TECHNIQUE: Multidetector CT imaging of the thoracic spine was performed without intravenous contrast administration. Multiplanar CT image reconstructions were also generated. COMPARISON:  Chest x-ray performed today. FINDINGS: There is a severe compression fracture involving the T7 vertebral body with near complete collapse. Mild wedge compression deformity at T8. The T7 compression fractures most likely chronic. The T8 compression fracture is age indeterminate. Cannot exclude acute. No malalignment. No retropulsed fracture fragments. Mild kyphosis centered at the T7 level. No surrounding soft tissue swelling. IMPRESSION: Severe chronic appearing T7 compression fracture. Mild T8 compression fracture, age indeterminate. Electronically Signed   By: Rolm Baptise M.D.   On: 03/02/2015 17:47   Ct Angio Chest Aorta W/cm &/or Wo/cm  03/02/2015  CLINICAL DATA:  Acute onset chest pain radiating to shoulders. Diaphoresis. Pre syncopal. Clinical suspicion for aortic dissection. EXAM: CT  ANGIOGRAPHY CHEST WITH CONTRAST TECHNIQUE: Multidetector CT imaging of the chest was performed using the standard protocol during bolus administration of intravenous contrast. Multiplanar CT image reconstructions and MIPs were obtained to evaluate the vascular anatomy. CONTRAST:  182mL OMNIPAQUE IOHEXOL 350 MG/ML SOLN COMPARISON:  None. FINDINGS:  Mediastinum/Lymph Nodes: No evidence of thoracic aortic aneurysm or dissection. No evidence of mediastinal hematoma. Heart size is normal. No evidence of pericardial effusion. No evidence of pulmonary emboli. No masses or pathologically enlarged lymph nodes identified. Lungs/Pleura: No pulmonary mass, infiltrate, or effusion. Linear opacity in the right lower lobe may be due to atelectasis or scarring. Biapical scarring also noted. Upper abdomen: No acute findings. Musculoskeletal: No chest wall mass or suspicious bone lesions identified. Old mid thoracic vertebral body compression fracture deformities noted. Review of the MIP images confirms the above findings. IMPRESSION: No evidence of thoracic aortic dissection or aneurysm. Mild right lower lobe atelectasis versus scarring. Electronically Signed   By: Earle Gell M.D.   On: 03/02/2015 17:50   I have personally reviewed and evaluated these images and lab results as part of my medical decision-making.   EKG Interpretation   Date/Time:  Wednesday March 02 2015 12:31:18 EDT Ventricular Rate:  69 PR Interval:  204 QRS Duration: 103 QT Interval:  409 QTC Calculation: 438 R Axis:   27 Text Interpretation:  Sinus rhythm RSR' in V1 or V2, right VCD or RVH  Minimal ST elevation, inferior leads No significant change since last  tracing Confirmed by Wilhelmine Krogstad MD, Cadence Haslam [phone]) on 03/02/2015 1:34:04 PM      MDM   Final diagnoses:  Compression fracture of thoracic spine, non-traumatic, initial encounter [facility])  Episodic lightheadedness   80 year old female who presents with an episode of generalized weakness  and near syncope that occurred while sitting at breakfast today. She was brought in by EMS and on arrival was awake, alert, and comfortable. VS unremarkable. Faint resting tremor on exam and mild L sided weakness which is her baseline. No other abnormalities on exam. BP R arm 125/55, L arm 131/51. EKG on arrival showed no significant changes from previous. Obtained above labs including troponin as well as CXR and orthostatic VS.  Labs unremarkable including negative serial troponins. CXR shows new severe compression fx T7 and tortuous aorta. Obtained a CT of the chest to evaluate for aortic dissection given the patient's known vascular disease and also obtained CT of thoracic spine to better characterize compression fracture. CT showed no acute findings to explain the patient's symptoms. T-spine films showed chronic T7 compression fracture and age indeterminant mild T8 compression fracture. On reexamination, the patient remains well-appearing and denies any current symptoms. I reviewed results of imaging studies. She denies any dizziness, visual changes, headache, or any other neurologic symptoms to suggest acute intracranial process as cause of her episode today. She also denies any chest pain during event. I have recommended that she follow closely with her PCP and she states that her husband already has an appointment for tomorrow so she will see the PCP at that time. Reviewed supportive care instructions for compression fractures and reviewed return precautions. Patient voiced understanding and was discharged in satisfactory condition.  Sharlett Iles, MD 03/02/15 [phone]

## 2015-03-03 ENCOUNTER — Encounter: Payer: Self-pay | Admitting: Family Medicine

## 2015-03-03 ENCOUNTER — Ambulatory Visit (INDEPENDENT_AMBULATORY_CARE_PROVIDER_SITE_OTHER): Payer: Medicare Other | Admitting: Family Medicine

## 2015-03-03 ENCOUNTER — Telehealth (HOSPITAL_COMMUNITY): Payer: Self-pay | Admitting: Physical Therapy

## 2015-03-03 VITALS — BP 104/58 | Ht 66.0 in | Wt 104.0 lb

## 2015-03-03 DIAGNOSIS — IMO0002 Reserved for concepts with insufficient information to code with codable children: Secondary | ICD-10-CM

## 2015-03-03 DIAGNOSIS — M25559 Pain in unspecified hip: Secondary | ICD-10-CM | POA: Diagnosis not present

## 2015-03-03 DIAGNOSIS — T148 Other injury of unspecified body region: Secondary | ICD-10-CM

## 2015-03-03 DIAGNOSIS — I639 Cerebral infarction, unspecified: Secondary | ICD-10-CM | POA: Diagnosis not present

## 2015-03-03 DIAGNOSIS — R109 Unspecified abdominal pain: Secondary | ICD-10-CM | POA: Diagnosis not present

## 2015-03-03 DIAGNOSIS — M19042 Primary osteoarthritis, left hand: Secondary | ICD-10-CM

## 2015-03-03 DIAGNOSIS — M1812 Unilateral primary osteoarthritis of first carpometacarpal joint, left hand: Secondary | ICD-10-CM

## 2015-03-03 DIAGNOSIS — M1810 Unilateral primary osteoarthritis of first carpometacarpal joint, unspecified hand: Secondary | ICD-10-CM | POA: Insufficient documentation

## 2015-03-03 DIAGNOSIS — I951 Orthostatic hypotension: Secondary | ICD-10-CM

## 2015-03-03 NOTE — Telephone Encounter (Signed)
Called and educated patient about time and location change for Parkinson's Disease support group for November; also advised patient that October meeting will still be in evening and will likely cover material similar to that presented in September (due to low census in September meeting).  Samantha Hudson PT, DPT [phone]

## 2015-03-03 NOTE — Progress Notes (Signed)
   Subjective:    Patient ID: Samantha Hudson, female    DOB: 08-Jul-1934, 80 y.o.   MRN: [id number]  HPIpt was sitting at [facility] yesterday and felt like she was going to pass out. Was having shortness of breath. Went to ed.  I reviewed over the ER records the scans along with reconfirming story from the patient she states she was sitting she felt dizzy she fell slightly warm she denied any chest pressure. The test they did showed a compression fracture no pulmonary embolus no heart attack. Having back and left shoulder pain.  Did xrays and found compression fracture.  Patient having some soreness in her back tramadol helps a little pain not severe  Has a tightness feeling in abdomen. Has had for a while. Getting worse.  She notices this more so when she is sitting she doesn't notices as much when she sleeps it does not wake her up at in the lower pelvic region she denies rectal bleeding denies hematuria  Pain left thump. Started several weeks ago.  Her thumb is causing her pain and discomfort stiff at times sore times does not get red or swells  Review of Systems  Constitutional: Negative for activity change, appetite change and fatigue.  HENT: Negative for congestion, ear discharge and rhinorrhea.   Eyes: Negative for discharge.  Respiratory: Negative for cough, chest tightness and wheezing.   Cardiovascular: Negative for chest pain.  Gastrointestinal: Negative for vomiting and abdominal pain.  Genitourinary: Negative for frequency and difficulty urinating.  Musculoskeletal: Negative for neck pain.  Allergic/Immunologic: Negative for environmental allergies and food allergies.  Neurological: Negative for weakness and headaches.  Psychiatric/Behavioral: Negative for behavioral problems and agitation.       Objective:   Physical Exam  Constitutional: She appears well-nourished. No distress.  Cardiovascular: Normal rate, regular rhythm and normal heart sounds.   No murmur  heard. Pulmonary/Chest: Effort normal and breath sounds normal. No respiratory distress.  Musculoskeletal: She exhibits no edema.  Lymphadenopathy:    She has no cervical adenopathy.  Neurological: She is alert. She exhibits normal muscle tone.  Psychiatric: Her behavior is normal.  Vitals reviewed.   kyphosis noted subjective tenderness in the thoracic spine   patient's blood pressure 100/68 sitting 92 over 50 standing  her thumb shows evidence of arthritis in the left joint    Assessment & Plan:   compression fracture-I do not feel the patient would benefit from kyphoplasty she is having some soreness and tenderness patient may use pain medicine tramadol and Tylenol when necessary.    significant abdominal discomfort patient feels that there is ongoing pressure in her lower abdomen abdominal exam unremarkable but we will do ultrasound to make sure we don't see the possibility of ovarian tumor. I do not feel patient would tolerate intravaginal ultrasound  25 minutes was spent with the patient. Greater than half the time was spent in discussion and answering questions and counseling regarding the issues that the patient came in for today.   Significant osteoarthritis left thumb Tylenol for this or tramadol.   Patient with a low blood pressure. I recommend increase salt use if not doing better by the time she follows up in one month consider Florinef

## 2015-03-09 ENCOUNTER — Other Ambulatory Visit (HOSPITAL_COMMUNITY): Payer: Medicare Other

## 2015-03-09 ENCOUNTER — Ambulatory Visit (HOSPITAL_COMMUNITY)
Admission: RE | Admit: 2015-03-09 | Discharge: 2015-03-09 | Disposition: A | Discharge status: Home / Self Care | Payer: Medicare Other | Source: Ambulatory Visit | Attending: Family Medicine | Admitting: Family Medicine

## 2015-03-09 ENCOUNTER — Ambulatory Visit (HOSPITAL_COMMUNITY): Admission: RE | Admit: 2015-03-09 | Payer: Medicare Other | Source: Ambulatory Visit

## 2015-03-09 DIAGNOSIS — N1330 Unspecified hydronephrosis: Secondary | ICD-10-CM | POA: Diagnosis not present

## 2015-03-09 DIAGNOSIS — R1032 Left lower quadrant pain: Secondary | ICD-10-CM | POA: Insufficient documentation

## 2015-03-09 DIAGNOSIS — R102 Pelvic and perineal pain: Secondary | ICD-10-CM | POA: Insufficient documentation

## 2015-03-09 DIAGNOSIS — Z90710 Acquired absence of both cervix and uterus: Secondary | ICD-10-CM | POA: Diagnosis not present

## 2015-03-14 ENCOUNTER — Encounter (INDEPENDENT_AMBULATORY_CARE_PROVIDER_SITE_OTHER): Payer: Self-pay

## 2015-03-14 ENCOUNTER — Ambulatory Visit (INDEPENDENT_AMBULATORY_CARE_PROVIDER_SITE_OTHER): Payer: Medicare Other | Admitting: Neurology

## 2015-03-14 ENCOUNTER — Encounter: Payer: Self-pay | Admitting: Neurology

## 2015-03-14 VITALS — BP 98/56 | HR 78 | Ht 66.0 in | Wt 104.0 lb

## 2015-03-14 DIAGNOSIS — I63511 Cerebral infarction due to unspecified occlusion or stenosis of right middle cerebral artery: Secondary | ICD-10-CM

## 2015-03-14 DIAGNOSIS — R569 Unspecified convulsions: Secondary | ICD-10-CM | POA: Diagnosis not present

## 2015-03-14 DIAGNOSIS — I639 Cerebral infarction, unspecified: Secondary | ICD-10-CM

## 2015-03-14 MED ORDER — LEVETIRACETAM ER 750 MG PO TB24: 750.0000 mg | ORAL_TABLET | Freq: Every day | ORAL | Status: DC

## 2015-03-14 NOTE — Progress Notes (Signed)
Chief Complaint  Patient presents with  . Parkison's Disease/CVA    MMSE 30/30 - 26 animals.  Reports she has been biting her tongue and inside corners of her mouth/cheek during the night.  She has also noticed her left foot has been swelling within several minutes of walking.      PATIENT: Samantha Hudson DOB: [birth year]  HISTORICAL  ARALYN NOWAK is a 80 years old right-handed Caucasian female, accompanied by her husband, a retired Software engineer, referred by her primary care physician Dr. Sallee Lange for evaluation of worsening gait difficulty, difficulty initiate gait  She had a history of right meningioma, status post resection in 2007, 2 days later, she suffered a large right MCA stroke, also with past medical history of coronary artery disease, status post stent placement, she recovered very well, was able to drive, ambulate without assistance, but with residual left hemiparesthesia, often unpleasant deep achy pain involving left side of her body,  She had osteoporosis, left hip fracture in 2014, require replacement in August 2014, since surgery, she had a great decline of her ambulatory ability, she now rely on her walker  In 2014, she also developed  loss sense of smell, REM sleep disorder, screaming out of her dreams, mild constipation, now she noticed right hand tremor, small handwriting since 2015, mild memory trouble, worsening gait difficulty, difficulty initiate walking using her right leg, tendency to lean backwards, worsening left-sided pain, especially around her left hip, left anterior thigh.  She is taking gabapentin, which has been helpful, but 400 mg 4 times a day, will make her sleepy, she is only taking 900 mg daily now, continue have excessive fatigue, daytime sleepiness, difficult to read, double vision.  We have reviewed MRI of the brain August 04 2014, demonstrate large size right MCA stroke, involving right medial, and lateral temporal lobe, no acute  lesions.  April 20 first 2016 Since her initial visit August 12 2014, because of mild parkinsonian features, I have started Sinemet 25/100 one tablet 3 times a day, she is taking it at 10, 3, and 9 PM, no significant improvement, no significant side effect, she complains mild low back pain, continue significant gait difficulty, difficulty picking up her right leg  UPDATE November 11 2014: She can move better after Sinemet dosage was increased to 25/100 mg 4 times a day, physical therapy was helpful, husband reported that she takes Sinemet 1 hour before any meal, become center of her daily activity,  She denies significant memory trouble, complains of chronic neck pain, bilateral shoulder pain, left hip pain, low back pain,  We have reviewed her MRI lumbar in May 2016, There is lumbosacral transitional anatomy. This report assumes that there are 5 lumbar type vertebral bodies. Recommend close correlation with radiographs if intervention is elected. Moderate multilevel lumbar spondylosis. Degenerative disease is most pronounced at L3-L4 potentially affecting both exiting L3 nerves. Healed sacral insufficiency fractures demonstrated on prior nuclear medicine bone scan.  UPDATE Mar 14 2015: Around Sep 2016, she woke up one night she has bite her left lateral tongue, maybe with urinary incontinence, she is at risk for seizure, we have personally reviewed MRI of the brain with and without contrast in March 2016, large right MCA encephalomalacia involving right temporal parietal frontal lobe.   She is taking Sinemet 25/100 ii tid, at 7, 12, 5 PM, which does help her parkinsonian features, she has occasionally upset stomach, husband is concerned about potential interaction of Azilect with tramadol, which she is  taking as needed for low back pain   REVIEW OF SYSTEMS: Full 14 system review of systems performed and notable only for  incontinence of bladder, frequent urination, foot allergy, back pain, muscle achy  pain, shortness of breath, bruise easily  ALLERGIES: Allergies  Allergen Reactions  . Lactose Intolerance (Gi)   . Penicillins Rash    Has patient had a PCN reaction causing immediate rash, facial/tongue/throat swelling, SOB or lightheadedness with hypotension: NO Has patient had a PCN reaction causing severe rash involving mucus membranes or skin necrosis: NO Has patient had a PCN reaction that required hospitalization: NO Has patient had a PCN reaction occurring within the last 10 years: NO If all of the above answers are "NO", then may proceed with Cephalosporin use.     HOME MEDICATIONS: Current Outpatient Prescriptions  Medication Sig Dispense Refill  . atenolol (TENORMIN) 25 MG tablet TAKE (1) TABLET TWICE A DAY. 60 tablet 1  . atorvastatin (LIPITOR) 10 MG tablet TAKE 1 TABLET AT BEDTIME FOR CHOLESTEROL 30 tablet 2  . calcium-vitamin D (OSCAL WITH D) 500-200 MG-UNIT per tablet Take 1 tablet by mouth.    . cholecalciferol (VITAMIN D) 400 UNITS TABS tablet Take 400 Units by mouth daily.    Marland Kitchen dipyridamole-aspirin (AGGRENOX) 200-25 MG per 12 hr capsule TAKE  (1)  CAPSULE  TWICE DAILY. 60 capsule 12  . DULoxetine (CYMBALTA) 30 MG capsule TAKE (1) CAPSULE DAILY. 30 capsule 12  . feeding supplement (ENSURE COMPLETE) LIQD Take 237 mL by mouth daily.    Marland Kitchen gabapentin (NEURONTIN) 100 MG capsule Take one in the afternoon 30 capsule 12  . gabapentin (NEURONTIN) 400 MG capsule Take 1 capsule (400 mg total) by mouth 2 (two) times daily. 60 capsule 12  . polyethylene glycol powder (GLYCOLAX/MIRALAX) powder Take 17 g by mouth 2 (two) times daily. 255 g 0  . potassium chloride (K-DUR) 10 MEQ tablet TAKE (1) TABLET TWICE A DAY. 60 tablet 12  . ranitidine (ZANTAC) 300 MG tablet TAKE (1) TABLET DAILY AS DIRECTED. 30 tablet 12  . torsemide (DEMADEX) 20 MG tablet Take one half qam 15 tablet 11  . traMADol (ULTRAM) 50 MG tablet Take 1 tablet (50 mg total) by mouth 3 (three) times daily as needed. 60  tablet 5     PAST MEDICAL HISTORY: Past Medical History  Diagnosis Date  . Hyperlipemia   . IBS (irritable bowel syndrome)   . Osteoporosis   . Fibromyalgia   . Glaucoma     HAD LASER SURGERY - NO EYE DROPS REQUIRED  . Globus hystericus   . Fibromyalgia   . Meningioma ([place])   . CAD (coronary artery disease)     HEART STENT PLACED ABOUT 2000- NO LONGER SEES CARDIOLOGIST; NO C/O OF CHEST PAINS OR SOB  . Heart murmur     MVP SINCE THE 60'S - TAKES ATENOLOL -  . Anxiety   . Swelling     BILATERAL FEET AND LEGS - ONSET OF SWELLING APRIL 2014  . GERD (gastroesophageal reflux disease)   . Arthritis   . Fracture   . CVA (cerebral vascular accident) ([place])     STROKE 7 Jud TO REMOVE BENIGN MENINGIOMA - HAS LEFT SIDED WEAKNESS AND A LITTLE NUMBNESS  . Pain     "EXCRUCIATING PAIN" LEFT HIP - HAS A FRACTURE - IS CONFINED TO W/C AT HOME - NO WEIGHT BEARING LEFT HIP.  Marland Kitchen Parkinson's disease ([place])  PAST SURGICAL HISTORY: Past Surgical History  Procedure Laterality Date  . Brain surgery      tumor removed  . Tonsillectomy    . Partial hysterectomy    . Glaucoma repair    . Cataract extraction    . Kidney surgery      left - HX ENLARGED LEFT KIDNEY ON XRAY - SURGERY WAS DONE ON URETER   . Colonoscopy    . Coronary angioplasty    . Hip arthroplasty Left 12/29/2012    Procedure: LEFT HIP HEMI ARTHROPLASTY ;  Surgeon: Mauri Pole, MD;  Location: [facility];  Service: Orthopedics;  Laterality: Left;    FAMILY HISTORY: Family History  Problem Relation Age of Onset  . Liver disease Father   . Diabetes Father   . Coronary artery disease Mother     deseased  . Heart attack Mother   . Hyperlipidemia Mother   . Breast cancer Sister     x 2  . Cancer Sister     breast    SOCIAL HISTORY:  Social History   Social History  . Marital Status: Married    Spouse Name: N/A  . Number of Children: 2  . Years of Education: 16   Occupational History  .  retired Pharmacist, hospital    Social History Main Topics  . Smoking status: Never Smoker   . Smokeless tobacco: Never Used  . Alcohol Use: No  . Drug Use: No  . Sexual Activity: Not on file   Other Topics Concern  . Not on file   Social History Narrative   Lives at home with husband.   Right- handed   Occasional caffeine use.     PHYSICAL EXAM   Filed Vitals:   03/14/15 1333  BP: 98/56  Pulse: 78  Height: 5\' 6"  (1.676 m)  Weight: 104 lb (47.174 kg)    Not recorded      Body mass index is 16.79 kg/(m^2).  PHYSICAL EXAMNIATION:  Gen: NAD, conversant, well nourised, obese, well groomed                     Cardiovascular: Regular rate rhythm, no peripheral edema, warm, nontender. Eyes: Conjunctivae clear without exudates or hemorrhage Neck: Supple, no carotid bruise. Pulmonary: Clear to auscultation bilaterally   NEUROLOGICAL EXAM:  MENTAL STATUS: Speech:    Speech is normal; fluent and spontaneous with normal comprehension.  Cognition:Mini-Mental Status Examination is 30 out of 30, animal naming is 26     Orientation to time, place and person     Normal recent and remote memory     Normal Attention span and concentration     Normal Language, naming, repeating,spontaneous speech     Fund of knowledge   CRANIAL NERVES:  CN II: Visual fields are full to confrontation.Pupils are 4 mm and briskly reactive to light.  CN III, IV, VI: extraocular movement are normal. No ptosis. CN V: Facial sensation is intact to pinprick in all 3 divisions bilaterally. Corneal responses are intact.  CN VII: Face is symmetric with normal eye closure and smile. CN VIII: Hearing is normal to rubbing fingers CN IX, X: Palate elevates symmetrically. Phonation is normal. CN XI: Head turning and shoulder shrug are intact CN XII: Tongue is midline with normal movements and no atrophy.  MOTOR: Left arm pronation drift, mild spasticity, fixation on rapid rotating movement, mild left upper extremity  proximal and distal muscle weakness, bradykinesia, worsening with reinforcement maneuver, mild/moderate nuchal Rigidity,  REFLEXES: Reflexes are 2+ and symmetric at the biceps, triceps, knees, and ankles. Plantar responses are flexor.  SENSORY: Decreased light touch, pinprick and vibratory sensation at left side of her body,    COORDINATION: Rapid alternating movements and fine finger movements are intact. There is no dysmetria on finger-to-nose and heel-knee-shin.  GAIT/STANCE: Push up from the chair arm, dragging left leg some, mild decrease stride, decreased left arm swing  DIAGNOSTIC DATA (LABS, IMAGING, TESTING) - I reviewed patient records, labs, notes, testing and imaging myself where available.  Lab Results  Component Value Date   WBC 6.6 03/02/2015   HGB 11.2* 03/02/2015   HCT 35.0* 03/02/2015   MCV 94.1 03/02/2015   PLT 186 03/02/2015      Component Value Date/Time   NA 138 03/02/2015 1348   NA 143 08/02/2014 1112   K 4.2 03/02/2015 1348   CL 103 03/02/2015 1348   CO2 30 03/02/2015 1348   GLUCOSE 113* 03/02/2015 1348   GLUCOSE 87 08/02/2014 1112   BUN 26* 03/02/2015 1348   BUN 31* 08/02/2014 1112   CREATININE 1.00 03/02/2015 1348   CREATININE 0.82 04/02/2014 1301   CALCIUM 9.6 03/02/2015 1348   PROT 6.8 02/18/2015 1116   PROT 7.1 04/02/2014 1301   ALBUMIN 4.1 02/18/2015 1116   ALBUMIN 4.2 04/02/2014 1301   AST 19 02/18/2015 1116   ALT 5 02/18/2015 1116   ALKPHOS 48 02/18/2015 1116   BILITOT 0.5 02/18/2015 1116   BILITOT 0.4 04/02/2014 1301   GFRNONAA 53* 03/02/2015 1348   GFRAA >60 03/02/2015 1348   Lab Results  Component Value Date   CHOL 155 02/18/2015   HDL 70 02/18/2015   LDLCALC 74 02/18/2015   TRIG 54 02/18/2015   CHOLHDL 2.2 02/18/2015   No results found for: HGBA1C Lab Results  Component Value Date   VITAMINB12 1186* 01/01/2014   Lab Results  Component Value Date   TSH 3.316 12/29/2013    ASSESSMENT AND PLAN  HIND CHESLER is  a 80 y.o. female with past medical history of right craniotomy for right-sided meningioma, develop post surgical right MCA stroke, since 2014, she had worsening gait difficulty, some parkinsonian features, moderate right ankle dorsiflexion weakness, dragging her right leg  Parkinson's disease  Keep Sinemet to 25/100 mg 2 tablet 3 times a day,  She is taking tramadol as needed for body achy pain, husband who was a pharmacist, is very hesitant to started on as a direct Seizure  Keppra XR 750 mg every night  EEG Right MCA stroke,   continue to address her vascular risk factor, continue Aggrenox twice a day Gait difficulty   multifactorial, a component of lumbar radiculopathy, low back pain, left side weakness due to previous right MCA stroke, Parkinson's disease   Continue moderate exercise    Marcial Pacas, M.D. Ph.D.  [facility] [address], [place], [place] [address] Ph: [phone] Fax: [phone]

## 2015-03-17 ENCOUNTER — Telehealth: Payer: Self-pay | Admitting: Neurology

## 2015-03-17 NOTE — Telephone Encounter (Signed)
Spoke to patient's husband, says her torsemide has been increased to 20mg  daily by her PCP.  I instructed him to call back to her PCP, if her foot swelling does not improved with increased dosage.  He also wanted [place] to know that she has had her flu shot.

## 2015-03-17 NOTE — Telephone Encounter (Signed)
Pt's husband called sts he forgot to relay last OV, pt had swelling in lt ft started about 1 week ago, episode in [employer] (this operator did not get date)-felt like she was going to pass out-EMT took pt to hospital. No problems were found. Please call and advise at [phone].

## 2015-03-18 ENCOUNTER — Telehealth: Payer: Self-pay | Admitting: Family Medicine

## 2015-03-18 NOTE — Telephone Encounter (Signed)
According to the chart she is taking Demadex 20 mg 1/2 tab qd. If this is correct, start whole tablet each day. Recheck with MD next week if no better. Go to ED if SOB or difficulty breathing.

## 2015-03-18 NOTE — Telephone Encounter (Signed)
Pt is still having swelling of her feet, she has been put on what  She thinks is the torsemide but states she has only been gone  To the bathroom twice today an feels she is still retaining too  Much water? Wants to know should she be concerned or does She need to let it take its time to work? Concerned an unsure what  To do at this point about the swelling.   [place] [facility]

## 2015-03-18 NOTE — Telephone Encounter (Signed)
Husband stated that he already increased her dose to one pill yesterday and today with no improvement in her sx. Consult with Carolyn-advised patient to got to ER. Husband notified and verbalized understanding.

## 2015-03-21 ENCOUNTER — Other Ambulatory Visit: Payer: Self-pay | Admitting: Family Medicine

## 2015-03-31 ENCOUNTER — Ambulatory Visit: Payer: Medicare Other | Admitting: Family Medicine

## 2015-04-04 ENCOUNTER — Other Ambulatory Visit (HOSPITAL_COMMUNITY): Payer: Self-pay | Admitting: Podiatry

## 2015-04-04 ENCOUNTER — Ambulatory Visit (HOSPITAL_COMMUNITY)
Admission: RE | Admit: 2015-04-04 | Discharge: 2015-04-04 | Disposition: A | Discharge status: Home / Self Care | Payer: Medicare Other | Source: Ambulatory Visit | Attending: Podiatry | Admitting: Podiatry

## 2015-04-04 ENCOUNTER — Other Ambulatory Visit: Payer: Self-pay | Admitting: Family Medicine

## 2015-04-04 DIAGNOSIS — R936 Abnormal findings on diagnostic imaging of limbs: Secondary | ICD-10-CM | POA: Diagnosis not present

## 2015-04-04 DIAGNOSIS — M79672 Pain in left foot: Secondary | ICD-10-CM | POA: Diagnosis present

## 2015-04-20 ENCOUNTER — Other Ambulatory Visit: Payer: Self-pay | Admitting: *Deleted

## 2015-04-20 ENCOUNTER — Other Ambulatory Visit: Payer: Self-pay | Admitting: Family Medicine

## 2015-04-20 NOTE — Progress Notes (Signed)
This in 3 additional office visits

## 2015-04-21 MED ORDER — TRAMADOL HCL 50 MG PO TABS: 50.0000 mg | ORAL_TABLET | Freq: Four times a day (QID) | ORAL | Status: DC | PRN

## 2015-04-21 NOTE — Progress Notes (Signed)
Duplicate-pls see other ( this and 3 refills)

## 2015-04-25 ENCOUNTER — Ambulatory Visit (INDEPENDENT_AMBULATORY_CARE_PROVIDER_SITE_OTHER): Payer: Medicare Other | Admitting: Neurology

## 2015-04-25 DIAGNOSIS — R569 Unspecified convulsions: Secondary | ICD-10-CM

## 2015-04-25 DIAGNOSIS — I63511 Cerebral infarction due to unspecified occlusion or stenosis of right middle cerebral artery: Secondary | ICD-10-CM

## 2015-04-27 ENCOUNTER — Other Ambulatory Visit: Payer: Self-pay | Admitting: Family Medicine

## 2015-05-02 ENCOUNTER — Encounter (HOSPITAL_COMMUNITY)
Admission: RE | Admit: 2015-05-02 | Discharge: 2015-05-02 | Disposition: A | Discharge status: Home / Self Care | Payer: Medicare Other | Source: Ambulatory Visit | Attending: Family Medicine | Admitting: Family Medicine

## 2015-05-02 DIAGNOSIS — M810 Age-related osteoporosis without current pathological fracture: Secondary | ICD-10-CM | POA: Diagnosis not present

## 2015-05-02 LAB — COMPREHENSIVE METABOLIC PANEL
ALT: 5 U/L — AB (ref 14–54)
AST: 25 U/L (ref 15–41)
Albumin: 4.4 g/dL (ref 3.5–5.0)
Alkaline Phosphatase: 49 U/L (ref 38–126)
Anion gap: 8 (ref 5–15)
BUN: 30 mg/dL — AB (ref 6–20)
CHLORIDE: 102 mmol/L (ref 101–111)
CO2: 32 mmol/L (ref 22–32)
CREATININE: 1.29 mg/dL — AB (ref 0.44–1.00)
Calcium: 10.2 mg/dL (ref 8.9–10.3)
GFR calc Af Amer: 45 mL/min — ABNORMAL LOW (ref >60)
GFR calc non Af Amer: 39 mL/min — ABNORMAL LOW (ref >60)
Glucose, Bld: 71 mg/dL (ref 65–99)
Potassium: 4.4 mmol/L (ref 3.5–5.1)
SODIUM: 142 mmol/L (ref 135–145)
Total Bilirubin: 0.6 mg/dL (ref 0.3–1.2)
Total Protein: 7.6 g/dL (ref 6.5–8.1)

## 2015-05-02 LAB — MAGNESIUM: Magnesium: 2.4 mg/dL (ref 1.7–2.4)

## 2015-05-02 LAB — PHOSPHORUS: Phosphorus: 4.4 mg/dL (ref 2.5–4.6)

## 2015-05-02 MED ORDER — ZOLEDRONIC ACID 5 MG/100ML IV SOLN
INTRAVENOUS | Status: AC
  Filled 2015-05-02: qty 100

## 2015-05-02 MED ORDER — SODIUM CHLORIDE 0.9 % IV SOLN
INTRAVENOUS | Status: DC
Start: 2015-05-02 — End: 2015-05-03
  Administered 2015-05-02: 250 mL via INTRAVENOUS

## 2015-05-02 MED ORDER — ZOLEDRONIC ACID 5 MG/100ML IV SOLN
5.0000 mg | Freq: Once | INTRAVENOUS | Status: AC
  Administered 2015-05-02: 5 mg via INTRAVENOUS

## 2015-05-03 ENCOUNTER — Telehealth: Payer: Self-pay | Admitting: Neurology

## 2015-05-03 ENCOUNTER — Encounter: Payer: Self-pay | Admitting: Family Medicine

## 2015-05-03 NOTE — Telephone Encounter (Signed)
Pt called and would like to know her EEG results. Please call and advise [phone]

## 2015-05-03 NOTE — Progress Notes (Signed)
Results for Samantha Hudson, Samantha Hudson (MRN [id number]) as of 05/03/2015 07:32  Ref. Range 05/02/2015 13:15  Sodium Latest Ref Range: 135-145 mmol/L 142  Potassium Latest Ref Range: 3.5-5.1 mmol/L 4.4  Chloride Latest Ref Range: 101-111 mmol/L 102  CO2 Latest Ref Range: 22-32 mmol/L 32  BUN Latest Ref Range: 6-20 mg/dL 30 (H)  Creatinine Latest Ref Range: 0.44-1.00 mg/dL 1.29 (H)  Calcium Latest Ref Range: 8.9-10.3 mg/dL 10.2  EGFR (Non-African Amer.) Latest Ref Range: >60 mL/min 39 (L)  EGFR (African American) Latest Ref Range: >60 mL/min 45 (L)  Glucose Latest Ref Range: 65-99 mg/dL 71  Anion gap Latest Ref Range: 5-15  8  Phosphorus Latest Ref Range: 2.5-4.6 mg/dL 4.4  Magnesium Latest Ref Range: 1.7-2.4 mg/dL 2.4  Alkaline Phosphatase Latest Ref Range: 38-126 U/L 49  Albumin Latest Ref Range: 3.5-5.0 g/dL 4.4  AST Latest Ref Range: 15-41 U/L 25  ALT Latest Ref Range: 14-54 U/L 5 (L)  Total Protein Latest Ref Range: 6.5-8.1 g/dL 7.6  Total Bilirubin Latest Ref Range: 0.3-1.2 mg/dL 0.6

## 2015-05-04 ENCOUNTER — Other Ambulatory Visit: Payer: Self-pay | Admitting: Family Medicine

## 2015-05-04 NOTE — Telephone Encounter (Addendum)
I have called her husband relate abnormal EEG,  This is an abnormal EEG. There is electrodiagnostic evidence of mild generalized slowing of background activity, asymmetry, with right side breach rhythm, in addition, there is evidence of frequent occurrence of right F4 sharp transient, consistent with her previous history of right MCA stroke, focal irritability, she is at high risk of developing right hemisphere partial seizure  She is able to tolerating Keppra XR 750 mg well, has no recurrent seizure

## 2015-05-04 NOTE — Procedures (Signed)
   HISTORY: 81 years old female, with history of right meningioma resection, right MCA stroke, presenting with partial seizure activity  TECHNIQUE:  16 channel EEG was performed based on standard 10-16 international system. One channel was dedicated to EKG, which has demonstrates normal sinus rhythm of 84 beats per minutes.  Upon awakening, the posterior background activity was dysarrhythmic, asymmetric, right side was slower, more irregular, in the theta range, higher amplitude, consistent with breach rhythm, in addition, there was intermittent F4 sharp transient, increased appearance with photic stimulation.   Left side posterior background activity was also mildly dysarrhythmic, in the theta range, 7 Hz, reactive to eye opening and closure, There was no evidence of epileptiform discharge.  Photic stimulation was performed, which induced an increased occurrence of F4 sharp transient.  No sleep was achieved.  CONCLUSION: This is an abnormal EEG.  There is electrodiagnostic evidence of mild generalized slowing of background activity, asymmetry, with right side breach rhythm, in addition, there is evidence of frequent occurrence of right F4 sharp transient, consistent with her previous history of right MCA stroke, focal irritability, she is at high risk of developing right hemisphere partial seizure.

## 2015-05-18 ENCOUNTER — Other Ambulatory Visit: Payer: Self-pay | Admitting: Family Medicine

## 2015-05-27 ENCOUNTER — Other Ambulatory Visit: Payer: Self-pay | Admitting: Family Medicine

## 2015-06-14 ENCOUNTER — Encounter: Payer: Self-pay | Admitting: Neurology

## 2015-06-14 ENCOUNTER — Ambulatory Visit (INDEPENDENT_AMBULATORY_CARE_PROVIDER_SITE_OTHER): Payer: Medicare Other | Admitting: Neurology

## 2015-06-14 VITALS — BP 118/62 | HR 73 | Ht 66.0 in | Wt 104.0 lb

## 2015-06-14 DIAGNOSIS — G20 Parkinson's disease: Secondary | ICD-10-CM

## 2015-06-14 DIAGNOSIS — R269 Unspecified abnormalities of gait and mobility: Secondary | ICD-10-CM | POA: Diagnosis not present

## 2015-06-14 DIAGNOSIS — I63511 Cerebral infarction due to unspecified occlusion or stenosis of right middle cerebral artery: Secondary | ICD-10-CM | POA: Diagnosis not present

## 2015-06-14 DIAGNOSIS — R569 Unspecified convulsions: Secondary | ICD-10-CM | POA: Diagnosis not present

## 2015-06-14 DIAGNOSIS — G20C Parkinsonism, unspecified: Secondary | ICD-10-CM

## 2015-06-14 MED ORDER — LAMOTRIGINE ER 100 MG PO TB24: 100.0000 mg | ORAL_TABLET | Freq: Every day | ORAL | Status: DC

## 2015-06-14 MED ORDER — LAMOTRIGINE ER 50 MG PO TB24: ORAL_TABLET | ORAL | Status: DC

## 2015-06-14 NOTE — Progress Notes (Signed)
Chief Complaint  Patient presents with  . Cerebrovascular Accident    She is here with her husband, Konrad Dolores and her daughter, Judson Roch. Feels memory is not a problem (last MMSE 30/30) but she has intermittent periods of confusion and agitation.  She is also having difficulty swallowing.      PATIENT: Samantha Hudson DOB: 1935-02-03  HISTORICAL  Samantha Hudson is a 81 years old right-handed Caucasian female, accompanied by her husband, a retired Software engineer, referred by her primary care physician Dr. Sallee Lange for evaluation of worsening gait difficulty, difficulty initiate gait  She had a history of right meningioma, status post resection in 2007, 2 days later, she suffered a large right MCA stroke, also with past medical history of coronary artery disease, status post stent placement, she recovered very well, was able to drive, ambulate without assistance, but with residual left hemiparesthesia, often unpleasant deep achy pain involving left side of her body,  She had osteoporosis, left hip fracture in 2014, require replacement in August 2014, since surgery, she had a great decline of her ambulatory ability, she now rely on her walker  In 2014, she also developed  loss sense of smell, REM sleep disorder, screaming out of her dreams, mild constipation, now she noticed right hand tremor, small handwriting since 2015, mild memory trouble, worsening gait difficulty, difficulty initiate walking using her right leg, tendency to lean backwards, worsening left-sided pain, especially around her left hip, left anterior thigh.  She is taking gabapentin, which has been helpful, but 400 mg 4 times a day, will make her sleepy, she is only taking 900 mg daily now, continue have excessive fatigue, daytime sleepiness, difficult to read, double vision.  We have reviewed MRI of the brain August 04 2014, demonstrate large size right MCA stroke, involving right medial, and lateral temporal lobe, no acute  lesions.  April 20 first 2016 Since her initial visit August 12 2014, because of mild parkinsonian features, I have started Sinemet 25/100 one tablet 3 times a day, she is taking it at 10, 3, and 9 PM, no significant improvement, no significant side effect, she complains mild low back pain, continue significant gait difficulty, difficulty picking up her right leg  UPDATE November 11 2014: She can move better after Sinemet dosage was increased to 25/100 mg 4 times a day, physical therapy was helpful, husband reported that she takes Sinemet 1 hour before any meal, become center of her daily activity,  She denies significant memory trouble, complains of chronic neck pain, bilateral shoulder pain, left hip pain, low back pain,  We have reviewed her MRI lumbar in May 2016, There is lumbosacral transitional anatomy. This report assumes that there are 5 lumbar type vertebral bodies. Recommend close correlation with radiographs if intervention is elected. Moderate multilevel lumbar spondylosis. Degenerative disease is most pronounced at L3-L4 potentially affecting both exiting L3 nerves. Healed sacral insufficiency fractures demonstrated on prior nuclear medicine bone scan.  UPDATE Mar 14 2015: Around Sep 2016, she woke up one night she has bite her left lateral tongue, maybe with urinary incontinence, she is at risk for seizure, we have personally reviewed MRI of the brain with and without contrast in March 2016, large right MCA encephalomalacia involving right temporal parietal frontal lobe.   She is taking Sinemet 25/100 ii tid, at 7, 12, 5 PM, which does help her parkinsonian features, she has occasionally upset stomach, husband is concerned about potential interaction of Azilect with tramadol, which she is taking  as needed for low back pain   UPDATE Jun 14 2015: She has mild difficult with swallowing, only limited to take medication tablet, has no difficulty swallowing her food,  Since Keppra XL our 750  mg was started in October 2016, she had one more episodes of woke up has bite her tongue in December 2016, husband also noticed she has increased agitations, continue has mild unsteady gait, left-sided low back pain, hip pain  ROS: 14 system review was performed, positive for frequent urination, food allergy, dizziness, agitation, joint pain, low back pain, daytime sleepiness, trouble swallowing  ALLERGIES: Allergies  Allergen Reactions  . Lactose Intolerance (Gi)   . Penicillins Rash    Has patient had a PCN reaction causing immediate rash, facial/tongue/throat swelling, SOB or lightheadedness with hypotension: NO Has patient had a PCN reaction causing severe rash involving mucus membranes or skin necrosis: NO Has patient had a PCN reaction that required hospitalization: NO Has patient had a PCN reaction occurring within the last 10 years: NO If all of the above answers are "NO", then may proceed with Cephalosporin use.     HOME MEDICATIONS: Current Outpatient Prescriptions  Medication Sig Dispense Refill  . atenolol (TENORMIN) 25 MG tablet TAKE (1) TABLET TWICE A DAY. 60 tablet 1  . atorvastatin (LIPITOR) 10 MG tablet TAKE 1 TABLET AT BEDTIME FOR CHOLESTEROL 30 tablet 2  . calcium-vitamin D (OSCAL WITH D) 500-200 MG-UNIT per tablet Take 1 tablet by mouth.    . cholecalciferol (VITAMIN D) 400 UNITS TABS tablet Take 400 Units by mouth daily.    Marland Kitchen dipyridamole-aspirin (AGGRENOX) 200-25 MG per 12 hr capsule TAKE  (1)  CAPSULE  TWICE DAILY. 60 capsule 12  . DULoxetine (CYMBALTA) 30 MG capsule TAKE (1) CAPSULE DAILY. 30 capsule 12  . feeding supplement (ENSURE COMPLETE) LIQD Take 237 mL by mouth daily.    Marland Kitchen gabapentin (NEURONTIN) 100 MG capsule Take one in the afternoon 30 capsule 12  . gabapentin (NEURONTIN) 400 MG capsule Take 1 capsule (400 mg total) by mouth 2 (two) times daily. 60 capsule 12  . polyethylene glycol powder (GLYCOLAX/MIRALAX) powder Take 17 g by mouth 2 (two) times daily.  255 g 0  . potassium chloride (K-DUR) 10 MEQ tablet TAKE (1) TABLET TWICE A DAY. 60 tablet 12  . ranitidine (ZANTAC) 300 MG tablet TAKE (1) TABLET DAILY AS DIRECTED. 30 tablet 12  . torsemide (DEMADEX) 20 MG tablet Take one half qam 15 tablet 11  . traMADol (ULTRAM) 50 MG tablet Take 1 tablet (50 mg total) by mouth 3 (three) times daily as needed. 60 tablet 5     PAST MEDICAL HISTORY: Past Medical History  Diagnosis Date  . Hyperlipemia   . IBS (irritable bowel syndrome)   . Osteoporosis   . Fibromyalgia   . Glaucoma     HAD LASER SURGERY - NO EYE DROPS REQUIRED  . Globus hystericus   . Fibromyalgia   . Meningioma ([place])   . CAD (coronary artery disease)     HEART STENT PLACED ABOUT 2000- NO LONGER SEES CARDIOLOGIST; NO C/O OF CHEST PAINS OR SOB  . Heart murmur     MVP SINCE THE 60'S - TAKES ATENOLOL -  . Anxiety   . Swelling     BILATERAL FEET AND LEGS - ONSET OF SWELLING APRIL 2014  . GERD (gastroesophageal reflux disease)   . Arthritis   . Fracture   . CVA (cerebral vascular accident) ([place])     STROKE  7 YRS AGO - DAY AFTER BRAIN SURGERY TO REMOVE BENIGN MENINGIOMA - HAS LEFT SIDED WEAKNESS AND A LITTLE NUMBNESS  . Pain     "EXCRUCIATING PAIN" LEFT HIP - HAS A FRACTURE - IS CONFINED TO W/C AT HOME - NO WEIGHT BEARING LEFT HIP.  Marland Kitchen Parkinson's disease ([place])     PAST SURGICAL HISTORY: Past Surgical History  Procedure Laterality Date  . Brain surgery      tumor removed  . Tonsillectomy    . Partial hysterectomy    . Glaucoma repair    . Cataract extraction    . Kidney surgery      left - HX ENLARGED LEFT KIDNEY ON XRAY - SURGERY WAS DONE ON URETER   . Colonoscopy    . Coronary angioplasty    . Hip arthroplasty Left 12/29/2012    Procedure: LEFT HIP HEMI ARTHROPLASTY ;  Surgeon: Mauri Pole, MD;  Location: [facility];  Service: Orthopedics;  Laterality: Left;    FAMILY HISTORY: Family History  Problem Relation Age of Onset  . Liver disease Father   . Diabetes  Father   . Coronary artery disease Mother     deseased  . Heart attack Mother   . Hyperlipidemia Mother   . Breast cancer Sister     x 2  . Cancer Sister     breast    SOCIAL HISTORY:  Social History   Social History  . Marital Status: Married    Spouse Name: N/A  . Number of Children: 2  . Years of Education: 16   Occupational History  . retired Pharmacist, hospital    Social History Main Topics  . Smoking status: Never Smoker   . Smokeless tobacco: Never Used  . Alcohol Use: No  . Drug Use: No  . Sexual Activity: Not on file   Other Topics Concern  . Not on file   Social History Narrative   Lives at home with husband.   Right- handed   Occasional caffeine use.     PHYSICAL EXAM   Filed Vitals:   06/14/15 1400  BP: 118/62  Pulse: 73  Height: 5\' 6"  (1.676 m)  Weight: 104 lb (47.174 kg)    Not recorded      Body mass index is 16.79 kg/(m^2).  PHYSICAL EXAMNIATION:  Gen: NAD, conversant, well nourised, obese, well groomed                     Cardiovascular: Regular rate rhythm, no peripheral edema, warm, nontender. Eyes: Conjunctivae clear without exudates or hemorrhage Neck: Supple, no carotid bruise. Pulmonary: Clear to auscultation bilaterally   NEUROLOGICAL EXAM:  MENTAL STATUS: Speech:    Speech is normal; fluent and spontaneous with normal comprehension.  Cognition:Mini-Mental Status Examination is 30 out of 30, animal naming is 26     Orientation to time, place and person     Normal recent and remote memory     Normal Attention span and concentration     Normal Language, naming, repeating,spontaneous speech     Fund of knowledge   CRANIAL NERVES:  CN II: Visual fields are full to confrontation.Pupils are 4 mm and briskly reactive to light.  CN III, IV, VI: extraocular movement are normal. No ptosis. CN V: Facial sensation is intact to pinprick in all 3 divisions bilaterally. Corneal responses are intact.  CN VII: Face is symmetric with normal  eye closure and smile. CN VIII: Hearing is normal to rubbing fingers CN  IX, X: Palate elevates symmetrically. Phonation is normal. CN XI: Head turning and shoulder shrug are intact CN XII: Tongue is midline with normal movements and no atrophy.  MOTOR: Left arm pronation drift, mild spasticity, fixation on rapid rotating movement, mild left upper extremity proximal and distal muscle weakness, bradykinesia, worsening with reinforcement maneuver, mild/moderate nuchal Rigidity,  REFLEXES: Reflexes are 2+ and symmetric at the biceps, triceps, knees, and ankles. Plantar responses are flexor.  SENSORY: Decreased light touch, pinprick and vibratory sensation at left side of her body,    COORDINATION: Rapid alternating movements and fine finger movements are intact. There is no dysmetria on finger-to-nose and heel-knee-shin.  GAIT/STANCE: Push up from the chair arm, dragging left leg some, mild decrease stride, decreased left arm swing  DIAGNOSTIC DATA (LABS, IMAGING, TESTING) - I reviewed patient records, labs, notes, testing and imaging myself where available.  Lab Results  Component Value Date   WBC 6.6 03/02/2015   HGB 11.2* 03/02/2015   HCT 35.0* 03/02/2015   MCV 94.1 03/02/2015   PLT 186 03/02/2015      Component Value Date/Time   NA 142 05/02/2015 1315   NA 143 08/02/2014 1112   K 4.4 05/02/2015 1315   CL 102 05/02/2015 1315   CO2 32 05/02/2015 1315   GLUCOSE 71 05/02/2015 1315   GLUCOSE 87 08/02/2014 1112   BUN 30* 05/02/2015 1315   BUN 31* 08/02/2014 1112   CREATININE 1.29* 05/02/2015 1315   CREATININE 0.82 04/02/2014 1301   CALCIUM 10.2 05/02/2015 1315   PROT 7.6 05/02/2015 1315   PROT 6.8 02/18/2015 1116   ALBUMIN 4.4 05/02/2015 1315   ALBUMIN 4.1 02/18/2015 1116   AST 25 05/02/2015 1315   ALT 5* 05/02/2015 1315   ALKPHOS 49 05/02/2015 1315   BILITOT 0.6 05/02/2015 1315   BILITOT 0.5 02/18/2015 1116   GFRNONAA 39* 05/02/2015 1315   GFRAA 45* 05/02/2015 1315    Lab Results  Component Value Date   CHOL 155 02/18/2015   HDL 70 02/18/2015   LDLCALC 74 02/18/2015   TRIG 54 02/18/2015   CHOLHDL 2.2 02/18/2015   No results found for: HGBA1C Lab Results  Component Value Date   VITAMINB12 1186* 01/01/2014   Lab Results  Component Value Date   TSH 3.316 12/29/2013    ASSESSMENT AND PLAN  Samantha Hudson is a 81 y.o. female with past medical history of right craniotomy for right-sided meningioma, develop post surgical right MCA stroke, since 2014, she had worsening gait difficulty, some parkinsonian features, moderate right ankle dorsiflexion weakness, dragging her right leg  Parkinson's disease  Keep Sinemet to 25/100 mg 2 tablet 3 times a day,  She is taking tramadol as needed for body achy pain, husband who was a pharmacist, is very hesitant to started on as a direct  Seizure  But she has increased agitation was Keppra XR 750 mg every day, had a recurrent seizure in December  We will switch her to lamotrigine xr, titrating to 100 plus 50 mg every night  Right MCA stroke,   continue to address her vascular risk factor, continue Aggrenox twice a day  Gait difficulty   multifactorial, a component of lumbar radiculopathy, low back pain, left side weakness due to previous right MCA stroke, Parkinson's disease   Continue moderate exercise  Physical therapy    Marcial Pacas, M.D. Ph.D.  [facility] [address] [place], [place] [address] Ph: [phone] Fax: [phone]

## 2015-06-20 ENCOUNTER — Ambulatory Visit (INDEPENDENT_AMBULATORY_CARE_PROVIDER_SITE_OTHER): Payer: Medicare Other | Admitting: Family Medicine

## 2015-06-20 ENCOUNTER — Ambulatory Visit: Payer: Medicare Other | Admitting: Family Medicine

## 2015-06-20 VITALS — BP 110/66 | Ht 66.0 in | Wt 103.0 lb

## 2015-06-20 DIAGNOSIS — R569 Unspecified convulsions: Secondary | ICD-10-CM | POA: Diagnosis not present

## 2015-06-20 DIAGNOSIS — R269 Unspecified abnormalities of gait and mobility: Secondary | ICD-10-CM | POA: Diagnosis not present

## 2015-06-20 DIAGNOSIS — R600 Localized edema: Secondary | ICD-10-CM

## 2015-06-20 DIAGNOSIS — G20 Parkinson's disease: Secondary | ICD-10-CM | POA: Diagnosis not present

## 2015-06-20 DIAGNOSIS — I63511 Cerebral infarction due to unspecified occlusion or stenosis of right middle cerebral artery: Secondary | ICD-10-CM

## 2015-06-20 DIAGNOSIS — M810 Age-related osteoporosis without current pathological fracture: Secondary | ICD-10-CM

## 2015-06-20 DIAGNOSIS — G20C Parkinsonism, unspecified: Secondary | ICD-10-CM

## 2015-06-20 DIAGNOSIS — N289 Disorder of kidney and ureter, unspecified: Secondary | ICD-10-CM

## 2015-06-20 NOTE — Progress Notes (Signed)
   Subjective:    Patient ID: Samantha Hudson, female    DOB: 18-Oct-1934, 81 y.o.   MRN: [id number]  HPI  Pain in left foot. Ongoing. Takes tramadol some.  Patient recently saw a podiatrist who diagnosed her with possible stress fracture did not feel a boot would be a good idea stated that it would eventually he'll Pain in left hip. Ongoing. Takes tramadol some. Able to do more walking than normal although still has mild ataxia and mild gait issues More confused while taking keppra. Neurology changed to lamotrigine. Was having side effects and changed back to keppra. Family interested in knowing if Keppra can be reduced. Weight is stable Dietary intake good Currently patient has a live in a day that helps her with all of her day-to-day living. I believe that this is medically necessary. Patient recently saw neurology see note.  Review of Systems  Constitutional: Negative for activity change, appetite change and fatigue.  HENT: Negative for congestion and rhinorrhea.   Respiratory: Negative for cough.   Cardiovascular: Negative for chest pain.  Gastrointestinal: Negative for abdominal pain and blood in stool.  Endocrine: Negative for polydipsia and polyphagia.  Genitourinary: Negative for dysuria.  Musculoskeletal: Positive for back pain, arthralgias and gait problem. Negative for myalgias.  Neurological: Negative for dizziness, tremors, seizures, syncope, weakness, light-headedness, numbness and headaches.  Psychiatric/Behavioral: Positive for agitation. Negative for confusion.   25 minutes was spent with the patient. Greater than half the time was spent in discussion and answering questions and counseling regarding the issues that the patient came in for today.     Objective:   Physical Exam  Neck no masses lungs are clear no crackles heart regular pulse normal extremities no edema skin warm dry walks with mild gait and ataxia    Assessment & Plan:  1. Renal insufficiency On  recent bloodwork patients creatinine slightly elevated therefore we'll recheck metabolic 7 - Basic metabolic panel  2. Osteoporosis Her bone density is currently do we will go ahead and set this up for her - DG Bone Density  3. Cerebrovascular accident (CVA) due to occlusion of right middle cerebral artery [facility]) Patient does not show any evidence of a repeat of the stroke. It still causes discomfort on her left side Neurontin seems to be doing a good job of controlling it  4. Parkinsonism, unspecified Parkinsonism type ([place]) She seems to be doing well with the medication. She will continue the medication and follow through with neurology  5. Pedal edema Minimal pedal edema.  6. Abnormality of gait She is at risk of falling importance of being careful how she walks stressed.  7. Seizures ([place]) No sign of any seizures currently but it does sound like she may have had one she did not tolerate Lamictal. She is having some confusion with the increased dose of Keppra. We will check with neurology to see if she can reduce the dose to 500 mg.  Patient will follow-up in 3-4 months

## 2015-06-21 LAB — BASIC METABOLIC PANEL
BUN/Creatinine Ratio: 23 (ref 11–26)
BUN: 25 mg/dL (ref 8–27)
CO2: 27 mmol/L (ref 18–29)
CREATININE: 1.08 mg/dL — AB (ref 0.57–1.00)
Calcium: 9.9 mg/dL (ref 8.7–10.3)
Chloride: 102 mmol/L (ref 96–106)
GFR calc Af Amer: 57 mL/min/{1.73_m2} — ABNORMAL LOW (ref >59)
GFR, EST NON AFRICAN AMERICAN: 49 mL/min/{1.73_m2} — AB (ref >59)
Glucose: 150 mg/dL — ABNORMAL HIGH (ref 65–99)
Potassium: 4.7 mmol/L (ref 3.5–5.2)
SODIUM: 144 mmol/L (ref 134–144)

## 2015-06-21 NOTE — Progress Notes (Signed)
I have reviewed and agreed above plan. 

## 2015-06-23 ENCOUNTER — Other Ambulatory Visit: Payer: Self-pay | Admitting: Family Medicine

## 2015-06-23 NOTE — Telephone Encounter (Signed)
May refill this and 5 refills 

## 2015-06-28 ENCOUNTER — Ambulatory Visit (HOSPITAL_COMMUNITY)
Admission: RE | Admit: 2015-06-28 | Discharge: 2015-06-28 | Disposition: A | Discharge status: Home / Self Care | Payer: Medicare Other | Source: Ambulatory Visit | Attending: Family Medicine | Admitting: Family Medicine

## 2015-06-28 DIAGNOSIS — Z780 Asymptomatic menopausal state: Secondary | ICD-10-CM | POA: Diagnosis not present

## 2015-06-28 DIAGNOSIS — M810 Age-related osteoporosis without current pathological fracture: Secondary | ICD-10-CM | POA: Diagnosis not present

## 2015-07-13 ENCOUNTER — Ambulatory Visit (HOSPITAL_COMMUNITY): Payer: Medicare Other | Attending: Neurology | Admitting: Physical Therapy

## 2015-07-13 ENCOUNTER — Encounter (HOSPITAL_COMMUNITY): Payer: Self-pay | Admitting: Physical Therapy

## 2015-07-13 DIAGNOSIS — R293 Abnormal posture: Secondary | ICD-10-CM | POA: Diagnosis present

## 2015-07-13 DIAGNOSIS — R531 Weakness: Secondary | ICD-10-CM | POA: Insufficient documentation

## 2015-07-13 DIAGNOSIS — Z9181 History of falling: Secondary | ICD-10-CM | POA: Diagnosis present

## 2015-07-13 DIAGNOSIS — Z7409 Other reduced mobility: Secondary | ICD-10-CM | POA: Diagnosis present

## 2015-07-13 DIAGNOSIS — G20 Parkinson's disease: Secondary | ICD-10-CM | POA: Diagnosis not present

## 2015-07-13 DIAGNOSIS — R269 Unspecified abnormalities of gait and mobility: Secondary | ICD-10-CM | POA: Insufficient documentation

## 2015-07-13 NOTE — Patient Instructions (Signed)
Tandem Stance    Right foot in front of left, heel touching toe both feet "straight ahead". Stand on Foot Triangle of Support with both feet. Balance in this position as long as you can. Do with left foot in front of right.  Repeat twice each side, 2-3 times per day.     Copyright  VHI. All rights reserved.     SINGLE LEG STANCE - SLS  Stand on one leg and maintain your balance. Hold as long as you can.   Perform twice each leg, twice a day; HOWEVER if this makes the pain in your left foot go up, focus on the right and let us know next time you come in.

## 2015-07-13 NOTE — Therapy (Addendum)
[facility] [address] [place], [place], [address] Phone: [phone]   Fax:  [phone]  Physical Therapy Evaluation  Patient Details  Name: Samantha Hudson MRN: [id number] Date of Birth: June 24, 1934 Referring Provider: Marcial Pacas   Encounter Date: 07/13/2015      PT End of Session - 07/13/15 1703    Visit Number 1   Number of Visits 16   Date for PT Re-Evaluation 08/10/15   Authorization Type Medicare and BCBS    Authorization Time Period 07/13/15 to 09/07/15   Authorization - Visit Number 1   Authorization - Number of Visits 10   PT Start Time [id number]   PT Stop Time 1602   PT Time Calculation (min) 47 min   Activity Tolerance Patient tolerated treatment well   Behavior During Therapy [facility] for tasks assessed/performed      Past Medical History  Diagnosis Date  . Hyperlipemia   . IBS (irritable bowel syndrome)   . Osteoporosis   . Fibromyalgia   . Glaucoma     HAD LASER SURGERY - NO EYE DROPS REQUIRED  . Globus hystericus   . Fibromyalgia   . Meningioma ([place])   . CAD (coronary artery disease)     HEART STENT PLACED ABOUT 2000- NO LONGER SEES CARDIOLOGIST; NO C/O OF CHEST PAINS OR SOB  . Heart murmur     MVP SINCE THE 60'S - TAKES ATENOLOL -  . Anxiety   . Swelling     BILATERAL FEET AND LEGS - ONSET OF SWELLING APRIL 2014  . GERD (gastroesophageal reflux disease)   . Arthritis   . Fracture   . CVA (cerebral vascular accident) ([place])     STROKE [address] [place] TO REMOVE BENIGN MENINGIOMA - HAS LEFT SIDED WEAKNESS AND A LITTLE NUMBNESS  . Pain     "EXCRUCIATING PAIN" LEFT HIP - HAS A FRACTURE - IS CONFINED TO W/C AT HOME - NO WEIGHT BEARING LEFT HIP.  Marland Kitchen Parkinson's disease [facility])     Past Surgical History  Procedure Laterality Date  . Brain surgery      tumor removed  . Tonsillectomy    . Partial hysterectomy    . Glaucoma repair    . Cataract extraction    . Kidney surgery      left - HX  ENLARGED LEFT KIDNEY ON XRAY - SURGERY WAS DONE ON URETER   . Colonoscopy    . Coronary angioplasty    . Hip arthroplasty Left 12/29/2012    Procedure: LEFT HIP HEMI ARTHROPLASTY ;  Surgeon: Mauri Pole, MD;  Location: [facility];  Service: Orthopedics;  Laterality: Left;    There were no vitals filed for this visit.  Visit Diagnosis:  Parkinson's disease ([place]) - Plan: PT plan of care cert/re-cert  Generalized weakness - Plan: PT plan of care cert/re-cert  Abnormality of gait - Plan: PT plan of care cert/re-cert  Impaired functional mobility, balance, gait, and endurance - Plan: PT plan of care cert/re-cert  Risk for falls - Plan: PT plan of care cert/re-cert  Poor posture - Plan: PT plan of care cert/re-cert      Subjective Assessment - 07/13/15 1520    Subjective Patient had been receiving skilled PT services and was discharged in the early fall of 2016. Reports that she had been walking quite a bit; reports that her MD thought she might have had a seizure at night and she is on  precautionary Keppra right now. She has been feeling more unsteady and feeling like sometimes she needs to take a step or two. Does not stand firmly on both feet, tends to keep weight away from L foot. Has had one or two close calls with falls that scared her. No vergio happening just yet. She reports that MD diagnosed stress fracture in her L foot; not in boot as MD was scared it would throw her balance off and she is full weight bearing.    Pertinent History CAD, hx of CVA, PD, osteoporosis, L hip replacement, hx of possible seizure, fibromyalgia    Patient Stated Goals less pain in L hip and foot and back; improve balance    Currently in Pain? Yes   Pain Score 6    Pain Location Leg   Pain Orientation Left;Upper;Mid   Pain Descriptors / Indicators Stabbing;Aching;Sharp   Pain Type Chronic pain   Pain Radiating Towards no radiation    Pain Onset More than a month ago   Pain Frequency Constant    Aggravating Factors  standing, sitting    Pain Relieving Factors tramadol    Effect of Pain on Daily Activities keeps her awake at night             [facility] PT Assessment - 07/13/15 0001    Assessment   Medical Diagnosis cva/parkinsons/gait    Referring Provider Marcial Pacas    Onset Date/Surgical Date --  chronic    Next MD Visit July 2017 with Dr. Krista Blue    Precautions   Precautions None   Restrictions   Weight Bearing Restrictions No   Balance Screen   Has the patient fallen in the past 6 months No   Has the patient had a decrease in activity level because of a fear of falling?  No   Is the patient reluctant to leave their home because of a fear of falling?  No   Prior Function   Level of Independence Needs assistance with ADLs;Independent with gait;Independent with transfers;Needs assistance with transfers;Requires assistive device for independence   Vocation Retired   Public affairs consultant booking    Observation/Other Assessments   Focus on Therapeutic Outcomes (FOTO)  48% limited    Posture/Postural Control   Posture Comments forward head, kyphotic, flexed at hips, reduced weight bearing L LE    Strength   Right Hip Flexion 3+/5   Right Hip Extension 2/5   Right Hip ABduction 3-/5   Left Hip Flexion 3+/5   Left Hip Extension 2/5   Left Hip ABduction 3-/5   Right Knee Flexion 3+/5   Right Knee Extension 4-/5   Left Knee Flexion 3+/5   Left Knee Extension 4-/5   Right Ankle Dorsiflexion 4-/5   Left Ankle Dorsiflexion 4-/5   Bed Mobility   Rolling Right 5: Supervision   Rolling Left 5: Supervision   Supine to Sit 4: Min guard   Sit to Supine 4: Min guard   Transfers   Sit to Stand 4: Min guard   Stand to Sit 4: Min guard   Ambulation/Gait   Gait Comments reduced gait speed, flexed at hips, reduced ankle DF, reduced step lengths, proximal muscle weakness    6 minute walk test results    Aerobic Endurance Distance Walked 396   Endurance additional comments 3MWT, rollator     Berg Balance Test   Sit to Stand Able to stand without using hands and stabilize independently   Standing Unsupported Able to stand safely  2 minutes   Sitting with Back Unsupported but Feet Supported on Floor or Stool Able to sit safely and securely 2 minutes   Stand to Sit Sits safely with minimal use of hands   Transfers Able to transfer safely, definite need of hands   Standing Unsupported with Eyes Closed Able to stand 10 seconds safely   Standing Ubsupported with Feet Together Able to place feet together independently and stand for 1 minute with supervision   From Standing, Reach Forward with Outstretched Arm Can reach forward >12 cm safely (5")   From Standing Position, Pick up Object from Floor Able to pick up shoe, needs supervision   From Standing Position, Turn to Look Behind Over each Shoulder Looks behind one side only/other side shows less weight shift   Turn 360 Degrees Able to turn 360 degrees safely but slowly   Standing Unsupported, Alternately Place Feet on Step/Stool Needs assistance to keep from falling or unable to try   Standing Unsupported, One Foot in Front Able to take small step independently and hold 30 seconds   Standing on One Leg Able to lift leg independently and hold 5-10 seconds   Total Score 42                           PT Education - 07/13/15 1702    Education provided Yes   Education Details prognosis, plan of care, HEP    Person(s) Educated Patient   Methods Explanation;Handout   Comprehension Verbalized understanding;Returned demonstration;Need further instruction          PT Short Term Goals - 07/13/15 1709    PT SHORT TERM GOAL #1   Title Patient will be able to maintain correct posture at least 70% of the time during all functional situations and scenarios in order to improve overall mobility and reduce fall risk    Time 4   Period Weeks   Status New   PT SHORT TERM GOAL #2   Title Patient will be able to complete bed  mobility with mod(I), correct sequencnig in order to enhance safety in mobility at home    Time 4   Period Weeks   Status New   PT SHORT TERM GOAL #3   Title Patient will be able to ambulate at least 825ft with LRAD, no rest breaks and no unsteadiness, in order to demonstrate improved mobility with reduced fall risk    Time 4   Period Weeks   Status New   PT SHORT TERM GOAL #4   Title Patient will score at least 48 on BERG balance test in order to demonstrate reduced unsteadiness and fall risk    Time 4   Period Weeks   Status New   PT SHORT TERM GOAL #5   Title Patient will consistently and correctly perform appropriate HEP, to be updated PRN    Time 4   Period Weeks   Status New           PT Long Term Goals - 07/13/15 1713    PT LONG TERM GOAL #1   Title Patient to demonstrate strength 4/5 in all tested muscle groups in order to improve functional stability and mobility, reduce fall risk    Time 8   Period Weeks   Status New   PT LONG TERM GOAL #2   Title Patient to report no falls, LOB, or close calls with falls within the past 4 weeks in order  to demonstrate improved functional and improved safety at home and in community    Time 8   Period Weeks   Status New   PT LONG TERM GOAL #3   Title Patient to be able to ambulate at least 1254ft during 6MWT with LRAD, no rest breaks and no unsteadiness, in order to demosntrate improved general function and readiness to perform community based tasks    Time 8   Period Weeks   Status New   PT LONG TERM GOAL #4   Title Patient to score at least 52 on BERG balance test in order to demonstrate minimal fall risk during mobility    Time 8   Period Weeks   Status New   PT LONG TERM GOAL #5   Title Patient to be performing at least 30 minutes of exercise, at least 5 days per week, in order to maintain functional gains and to promote improved health habits    Time 8   Period Weeks   Status New               Plan -  08/04/2015 1705    Clinical Impression Statement Patient arrives reporting that she has been doing well recently, has been able to walk as far as a mile but has been feeling a little more unsteady recently, also recently got a stress fracture in her L foot- MD did not want to put her in a boot due to balance concerns but is monitoring. Her husband recently had spine surgery and he reports that they now have an aide in their house 24/7 who can assist patient with HEP. Upon examination, patient reveals muscle weakness, reduced gait speed with impaired mechanics, unsteadiness, and postural impairment; at this time she will benefit from skilled PT services in order to address functional limitations and assist in reaching optimal level of function.    Pt will benefit from skilled therapeutic intervention in order to improve on the following deficits Abnormal gait;Decreased strength;Decreased balance;Decreased mobility;Difficulty walking;Decreased coordination;Postural dysfunction;Impaired flexibility   Rehab Potential Excellent   Clinical Impairments Affecting Rehab Potential stress fracture L LE    PT Frequency 2x / week   PT Duration 8 weeks   PT Treatment/Interventions ADLs/Self Care Home Management;DME Instruction;Gait training;Stair training;Functional mobility training;Therapeutic activities;Therapeutic exercise;Balance training;Neuromuscular re-education;Patient/family education;Manual techniques;Energy conservation;Taping   PT Next Visit Plan review HEP and goals; postural stretching, functional strengthening, balance and gait training    PT Home Exercise Plan given    Consulted and Agree with Plan of Care Patient          G-Codes - August 04, 2015 1717    Functional Assessment Tool Used Per FOTO, 48% limited    Functional Limitation Mobility: Walking and moving around   Mobility: Walking and Moving Around Current Status [id number]) At least 40 percent but less than 60 percent impaired, limited or  restricted   Mobility: Walking and Moving Around Goal Status [id number]) At least 20 percent but less than 40 percent impaired, limited or restricted       Problem List Patient Active Problem List   Diagnosis Date Noted  . Seizures ([place]) 06/14/2015  . Degenerative arthritis of thumb 03/03/2015  . Abnormality of gait 08/12/2014  . Parkinsonism ([place]) 08/12/2014  . Stroke ([place]) 08/12/2014  . Muscle weakness (generalized) 02/22/2014  . Stiffness of left knee 02/22/2014  . Hamstring tightness of left lower extremity 02/22/2014  . Pain in joint, pelvic region and thigh 02/22/2014  . Difficulty walking 04/07/2013  . Osteoporosis  03/10/2013  . Anemia 03/10/2013  . Mild malnutrition ([place]) 03/10/2013  . S/P left hip hemiarthroplasty 12/30/2012  . Osteoporosis with pathological fracture with delayed healing 11/25/2012  . Pedal edema 10/27/2012  . Fracture of sacrum ([place]) 10/02/2012  . Stricture and stenosis of esophagus 09/24/2011  . Hyperlipidemia 11/03/2008  . CAD, UNSPECIFIED SITE 11/03/2008   Deniece Ree PT, DPT [phone]  [facility] [address] [place], [place], [address] Phone: [phone]   Fax:  [phone]  Name: Samantha Hudson MRN: [id number] Date of Birth: February 08, 1935

## 2015-07-15 ENCOUNTER — Other Ambulatory Visit: Payer: Self-pay | Admitting: Neurology

## 2015-07-18 ENCOUNTER — Telehealth (HOSPITAL_COMMUNITY): Payer: Self-pay

## 2015-07-18 ENCOUNTER — Ambulatory Visit (HOSPITAL_COMMUNITY): Payer: Medicare Other

## 2015-07-18 NOTE — Telephone Encounter (Signed)
Therapist called and spoke to the patient this date at 4:25 pm to notify patient of missed PT visit that was scheduled for earlier this morning at 11:00 am. Patient apologized and stated that she has "come down with a cold" and that she simply forgot about her PT appt. Therapist reminded patientt of her next PT visit that is scheduled for this Friday, 07/22/2015. Patient noted that she would plan to make her next PT visit unless she is not feeling well.   Garen Lah, PT, DPT

## 2015-07-19 ENCOUNTER — Encounter: Payer: Self-pay | Admitting: Family Medicine

## 2015-07-19 ENCOUNTER — Ambulatory Visit (INDEPENDENT_AMBULATORY_CARE_PROVIDER_SITE_OTHER): Payer: Medicare Other | Admitting: Family Medicine

## 2015-07-19 VITALS — BP 108/66 | Temp 98.1°F | Wt 104.0 lb

## 2015-07-19 DIAGNOSIS — J111 Influenza due to unidentified influenza virus with other respiratory manifestations: Secondary | ICD-10-CM

## 2015-07-19 DIAGNOSIS — J209 Acute bronchitis, unspecified: Secondary | ICD-10-CM

## 2015-07-19 DIAGNOSIS — I63511 Cerebral infarction due to unspecified occlusion or stenosis of right middle cerebral artery: Secondary | ICD-10-CM | POA: Diagnosis not present

## 2015-07-19 DIAGNOSIS — J019 Acute sinusitis, unspecified: Secondary | ICD-10-CM | POA: Diagnosis not present

## 2015-07-19 MED ORDER — AZITHROMYCIN 250 MG PO TABS: ORAL_TABLET | ORAL | Status: DC

## 2015-07-19 NOTE — Progress Notes (Signed)
   Subjective:    Patient ID: Samantha Hudson, female    DOB: [birth year], 81 y.o.   MRN: [id number]  Cough This is a new problem. The current episode started yesterday. Associated symptoms include headaches, nasal congestion and a sore throat. Nothing aggravates the symptoms.   flulike illness started Friday and Saturday with body aches low-grade fever not feeling good now with deeper cough congestion denies any wheezing or difficulty breathing   Review of Systems  HENT: Positive for sore throat.   Respiratory: Positive for cough.   Neurological: Positive for headaches.       Objective:   Physical Exam  Neck no masses eardrums normal throat is normal lungs are clear hearts regular no respiratory distress      Assessment & Plan:  Influenza-the patient was diagnosed with influenza. Patient/family educated about the flu and warning signs to watch for. If difficulty breathing, severe neck pain and stiffness, cyanosis, disorientation, or progressive worsening then immediately get rechecked at that ER. If progressive symptoms be certain to be rechecked. Supportive measures such as Tylenol/ibuprofen was discussed. No aspirin use in children. And influenza home care instruction sheet was given. The patient is too far along to use Tamiflu. Acute bronchitis secondary to the flu Z-Pak as directed Follow-up 48 hours if not improving.

## 2015-07-21 ENCOUNTER — Other Ambulatory Visit: Payer: Self-pay | Admitting: Family Medicine

## 2015-07-22 ENCOUNTER — Ambulatory Visit (HOSPITAL_COMMUNITY): Payer: Medicare Other | Admitting: Physical Therapy

## 2015-07-26 ENCOUNTER — Ambulatory Visit (HOSPITAL_COMMUNITY): Payer: Medicare Other | Admitting: Physical Therapy

## 2015-07-27 ENCOUNTER — Encounter: Payer: Self-pay | Admitting: Family Medicine

## 2015-07-27 ENCOUNTER — Ambulatory Visit (INDEPENDENT_AMBULATORY_CARE_PROVIDER_SITE_OTHER): Payer: Medicare Other | Admitting: Family Medicine

## 2015-07-27 VITALS — Ht 66.0 in | Wt 104.0 lb

## 2015-07-27 DIAGNOSIS — R319 Hematuria, unspecified: Secondary | ICD-10-CM | POA: Diagnosis not present

## 2015-07-27 DIAGNOSIS — I63511 Cerebral infarction due to unspecified occlusion or stenosis of right middle cerebral artery: Secondary | ICD-10-CM

## 2015-07-27 DIAGNOSIS — N3001 Acute cystitis with hematuria: Secondary | ICD-10-CM | POA: Diagnosis not present

## 2015-07-27 LAB — POCT URINALYSIS DIPSTICK
Blood, UA: 250
PROTEIN UA: 100
pH, UA: 5

## 2015-07-27 MED ORDER — CEFPROZIL 500 MG PO TABS: 500.0000 mg | ORAL_TABLET | Freq: Two times a day (BID) | ORAL | Status: DC

## 2015-07-27 NOTE — Progress Notes (Signed)
   Subjective:    Patient ID: Samantha Hudson, female    DOB: [birth year], 81 y.o.   MRN: [id number]  Hematuria This is a new problem. The current episode started today. (Pelvic pain, dysuria, )   patient denies high fever chills sweats nausea vomiting diarrhea relates just seen blood in the urine. This concerned her. She states it seems to be a little bit better this evening no fever or flank pain no nausea vomiting or diarrhea   UA with WBCs and RBCs Review of Systems  Genitourinary: Positive for hematuria.       Objective:   Physical Exam Lungs clear heart regular abdomen soft flanks nontender genital exam no sign of bleeding       Assessment & Plan:  Hematuria-I find no evidence of this coming from the vagina. Culture taken. Antibiotics prescribed, recheck urine in a few weeks time

## 2015-07-28 ENCOUNTER — Ambulatory Visit (HOSPITAL_COMMUNITY): Payer: Medicare Other | Admitting: Physical Therapy

## 2015-07-29 LAB — URINE CULTURE

## 2015-08-02 ENCOUNTER — Ambulatory Visit (HOSPITAL_COMMUNITY): Payer: Medicare Other | Attending: Neurology | Admitting: Physical Therapy

## 2015-08-02 DIAGNOSIS — R269 Unspecified abnormalities of gait and mobility: Secondary | ICD-10-CM | POA: Diagnosis present

## 2015-08-02 DIAGNOSIS — Z7409 Other reduced mobility: Secondary | ICD-10-CM | POA: Insufficient documentation

## 2015-08-02 DIAGNOSIS — Z9181 History of falling: Secondary | ICD-10-CM | POA: Diagnosis present

## 2015-08-02 DIAGNOSIS — R293 Abnormal posture: Secondary | ICD-10-CM | POA: Insufficient documentation

## 2015-08-02 DIAGNOSIS — G20 Parkinson's disease: Secondary | ICD-10-CM | POA: Diagnosis not present

## 2015-08-02 DIAGNOSIS — R531 Weakness: Secondary | ICD-10-CM | POA: Insufficient documentation

## 2015-08-02 NOTE — Therapy (Signed)
[facility] [address] [place], [place], [address] Phone: [phone]   Fax:  [phone]  Physical Therapy Treatment  Patient Details  Name: Samantha Hudson MRN: [id number] Date of Birth: November 20, 1934 Referring Provider: Marcial Pacas   Encounter Date: 08/02/2015      PT End of Session - 08/02/15 1715    Visit Number 2   Number of Visits 16   Date for PT Re-Evaluation 08/10/15   Authorization Type Medicare and BCBS    Authorization Time Period 07/13/15 to 09/07/15   Authorization - Visit Number 2   Authorization - Number of Visits 10   PT Start Time 1522  patient arrived late    PT Stop Time 1606   PT Time Calculation (min) 44 min   Activity Tolerance Patient tolerated treatment well;Patient limited by fatigue   Behavior During Therapy [facility] for tasks assessed/performed      Past Medical History  Diagnosis Date  . Hyperlipemia   . IBS (irritable bowel syndrome)   . Osteoporosis   . Fibromyalgia   . Glaucoma     HAD LASER SURGERY - NO EYE DROPS REQUIRED  . Globus hystericus   . Fibromyalgia   . Meningioma ([place])   . CAD (coronary artery disease)     HEART STENT PLACED ABOUT 2000- NO LONGER SEES CARDIOLOGIST; NO C/O OF CHEST PAINS OR SOB  . Heart murmur     MVP SINCE THE 60'S - TAKES ATENOLOL -  . Anxiety   . Swelling     BILATERAL FEET AND LEGS - ONSET OF SWELLING APRIL 2014  . GERD (gastroesophageal reflux disease)   . Arthritis   . Fracture   . CVA (cerebral vascular accident) ([place])     STROKE 7 [place] TO REMOVE BENIGN MENINGIOMA - HAS LEFT SIDED WEAKNESS AND A LITTLE NUMBNESS  . Pain     "EXCRUCIATING PAIN" LEFT HIP - HAS A FRACTURE - IS CONFINED TO W/C AT HOME - NO WEIGHT BEARING LEFT HIP.  Marland Kitchen Parkinson's disease [facility])     Past Surgical History  Procedure Laterality Date  . Brain surgery      tumor removed  . Tonsillectomy    . Partial hysterectomy    . Glaucoma repair    . Cataract  extraction    . Kidney surgery      left - HX ENLARGED LEFT KIDNEY ON XRAY - SURGERY WAS DONE ON URETER   . Colonoscopy    . Coronary angioplasty    . Hip arthroplasty Left 12/29/2012    Procedure: LEFT HIP HEMI ARTHROPLASTY ;  Surgeon: Mauri Pole, MD;  Location: [facility];  Service: Orthopedics;  Laterality: Left;    There were no vitals filed for this visit.  Visit Diagnosis:  Parkinson's disease ([place])  Generalized weakness  Abnormality of gait  Impaired functional mobility, balance, gait, and endurance  Risk for falls  Poor posture      Subjective Assessment - 08/02/15 1705    Subjective Patient reports that she and her husband have been ill recently with something like the flu or a bad cold; she is just now getting back on her feet but she reports taht her husband still is not feeling very well. She also reports that she has been on and off of antibiotics and other medications due to her illness. Just feeling a little under the weather and like she has lost her endurance as she  has a hard time walking even 15 minutes in the house right now.    Pertinent History CAD, hx of CVA, PD, osteoporosis, L hip replacement, hx of possible seizure, fibromyalgia    Patient Stated Goals less pain in L hip and foot and back; improve balance    Currently in Pain? No/denies                         [facility] Adult PT Treatment/Exercise - 08/02/15 0001    Lumbar Exercises: Standing   Other Standing Lumbar Exercises PWR! up, rock, twist, and step 1x10 with min guard    Lumbar Exercises: Quadruped   Other Quadruped Lumbar Exercises PWR! power up and power twist exercises 1x10   Knee/Hip Exercises: Seated   Other Seated Knee/Hip Exercises PWR! seated sequence 1x10 each of 4 ways                 PT Education - 08/02/15 1714    Education provided Yes   Education Details benefits of PWR! program and possible beneficial outcomes, importance of regular activity tailored to  patient tolerance    Person(s) Educated Patient   Methods Explanation   Comprehension Verbalized understanding          PT Short Term Goals - 07/13/15 1709    PT SHORT TERM GOAL #1   Title Patient will be able to maintain correct posture at least 70% of the time during all functional situations and scenarios in order to improve overall mobility and reduce fall risk    Time 4   Period Weeks   Status New   PT SHORT TERM GOAL #2   Title Patient will be able to complete bed mobility with mod(I), correct sequencnig in order to enhance safety in mobility at home    Time 4   Period Weeks   Status New   PT SHORT TERM GOAL #3   Title Patient will be able to ambulate at least 825ft with LRAD, no rest breaks and no unsteadiness, in order to demonstrate improved mobility with reduced fall risk    Time 4   Period Weeks   Status New   PT SHORT TERM GOAL #4   Title Patient will score at least 48 on BERG balance test in order to demonstrate reduced unsteadiness and fall risk    Time 4   Period Weeks   Status New   PT SHORT TERM GOAL #5   Title Patient will consistently and correctly perform appropriate HEP, to be updated PRN    Time 4   Period Weeks   Status New           PT Long Term Goals - 07/13/15 1713    PT LONG TERM GOAL #1   Title Patient to demonstrate strength 4/5 in all tested muscle groups in order to improve functional stability and mobility, reduce fall risk    Time 8   Period Weeks   Status New   PT LONG TERM GOAL #2   Title Patient to report no falls, LOB, or close calls with falls within the past 4 weeks in order to demonstrate improved functional and improved safety at home and in community    Time 8   Period Weeks   Status New   PT LONG TERM GOAL #3   Title Patient to be able to ambulate at least 1274ft during 6MWT with LRAD, no rest breaks and no unsteadiness, in order to demosntrate  improved general function and readiness to perform community based tasks     Time 8   Period Weeks   Status New   PT LONG TERM GOAL #4   Title Patient to score at least 52 on BERG balance test in order to demonstrate minimal fall risk during mobility    Time 8   Period Weeks   Status New   PT LONG TERM GOAL #5   Title Patient to be performing at least 30 minutes of exercise, at least 5 days per week, in order to maintain functional gains and to promote improved health habits    Time 8   Period Weeks   Status New               Plan - 08/02/15 1715    Clinical Impression Statement Patient returns to skilled PT services after being gone for a couple of weeks with respiratory illness; patient reports she is just feeling down and with little energy, still trying to recover from being ill. Intorduced PWR! sequences in sitting, quadruped, and standing today with frequent rest breaks  throughotu session today due to fatigue. Patient demonstrated relatively good form with all exercises today but did require cues for maximal range and extension, as well as min guard/assist for correct balance and form overall. Per patient, "I bet I'll feel  this tomorrow". Overall good performance from patient today.    Pt will benefit from skilled therapeutic intervention in order to improve on the following deficits Abnormal gait;Decreased strength;Decreased balance;Decreased mobility;Difficulty walking;Decreased coordination;Postural dysfunction;Impaired flexibility   Rehab Potential Excellent   Clinical Impairments Affecting Rehab Potential stress fracture L LE    PT Frequency 2x / week   PT Duration 8 weeks   PT Treatment/Interventions ADLs/Self Care Home Management;DME Instruction;Gait training;Stair training;Functional mobility training;Therapeutic activities;Therapeutic exercise;Balance training;Neuromuscular re-education;Patient/family education;Manual techniques;Energy conservation;Taping   PT Next Visit Plan review HEP and goals; postural stretching, functional strengthening,  balance and gait training    PT Home Exercise Plan given    Consulted and Agree with Plan of Care Patient        Problem List Patient Active Problem List   Diagnosis Date Noted  . Seizures ([place]) 06/14/2015  . Degenerative arthritis of thumb 03/03/2015  . Abnormality of gait 08/12/2014  . Parkinsonism ([place]) 08/12/2014  . Stroke ([place]) 08/12/2014  . Muscle weakness (generalized) 02/22/2014  . Stiffness of left knee 02/22/2014  . Hamstring tightness of left lower extremity 02/22/2014  . Pain in joint, pelvic region and thigh 02/22/2014  . Difficulty walking 04/07/2013  . Osteoporosis 03/10/2013  . Anemia 03/10/2013  . Mild malnutrition ([place]) 03/10/2013  . S/P left hip hemiarthroplasty 12/30/2012  . Osteoporosis with pathological fracture with delayed healing 11/25/2012  . Pedal edema 10/27/2012  . Fracture of sacrum ([place]) 10/02/2012  . Stricture and stenosis of esophagus 09/24/2011  . Hyperlipidemia 11/03/2008  . CAD, UNSPECIFIED SITE 11/03/2008    Deniece Ree PT, DPT [phone]  [facility] [address] [place], [place], [address] Phone: [phone]   Fax:  [phone]  Name: Samantha Hudson MRN: [id number] Date of Birth: 14-Nov-1934

## 2015-08-05 ENCOUNTER — Ambulatory Visit (HOSPITAL_COMMUNITY): Payer: Medicare Other | Admitting: Physical Therapy

## 2015-08-05 DIAGNOSIS — R531 Weakness: Secondary | ICD-10-CM

## 2015-08-05 DIAGNOSIS — R293 Abnormal posture: Secondary | ICD-10-CM

## 2015-08-05 DIAGNOSIS — R269 Unspecified abnormalities of gait and mobility: Secondary | ICD-10-CM

## 2015-08-05 DIAGNOSIS — G20 Parkinson's disease: Secondary | ICD-10-CM | POA: Diagnosis not present

## 2015-08-05 DIAGNOSIS — Z9181 History of falling: Secondary | ICD-10-CM

## 2015-08-05 DIAGNOSIS — Z7409 Other reduced mobility: Secondary | ICD-10-CM

## 2015-08-05 NOTE — Therapy (Signed)
[facility] [address] [place], [place], [address] Phone: [phone]   Fax:  [phone]  Physical Therapy Treatment  Patient Details  Name: Samantha Hudson MRN: [id number] Date of Birth: [birth year] Referring Provider: Marcial Pacas   Encounter Date: 08/05/2015      PT End of Session - 08/05/15 1202    Visit Number 3   Number of Visits 16   Date for PT Re-Evaluation 08/10/15   Authorization Type Medicare and BCBS    Authorization Time Period 07/13/15 to 09/07/15   Authorization - Visit Number 3   Authorization - Number of Visits 10   PT Start Time 1102   PT Stop Time 1146   PT Time Calculation (min) 44 min   Equipment Utilized During Treatment Gait belt   Activity Tolerance Patient tolerated treatment well;Patient limited by fatigue  reports not sleeping well and being more fatigued today.    Behavior During Therapy [facility] for tasks assessed/performed      Past Medical History  Diagnosis Date  . Hyperlipemia   . IBS (irritable bowel syndrome)   . Osteoporosis   . Fibromyalgia   . Glaucoma     HAD LASER SURGERY - NO EYE DROPS REQUIRED  . Globus hystericus   . Fibromyalgia   . Meningioma ([place])   . CAD (coronary artery disease)     HEART STENT PLACED ABOUT 2000- NO LONGER SEES CARDIOLOGIST; NO C/O OF CHEST PAINS OR SOB  . Heart murmur     MVP SINCE THE 60'S - TAKES ATENOLOL -  . Anxiety   . Swelling     BILATERAL FEET AND LEGS - ONSET OF SWELLING APRIL 2014  . GERD (gastroesophageal reflux disease)   . Arthritis   . Fracture   . CVA (cerebral vascular accident) ([place])     STROKE 7 [place] TO REMOVE BENIGN MENINGIOMA - HAS LEFT SIDED WEAKNESS AND A LITTLE NUMBNESS  . Pain     "EXCRUCIATING PAIN" LEFT HIP - HAS A FRACTURE - IS CONFINED TO W/C AT HOME - NO WEIGHT BEARING LEFT HIP.  Marland Kitchen Parkinson's disease [facility])     Past Surgical History  Procedure Laterality Date  . Brain surgery      tumor removed   . Tonsillectomy    . Partial hysterectomy    . Glaucoma repair    . Cataract extraction    . Kidney surgery      left - HX ENLARGED LEFT KIDNEY ON XRAY - SURGERY WAS DONE ON URETER   . Colonoscopy    . Coronary angioplasty    . Hip arthroplasty Left 12/29/2012    Procedure: LEFT HIP HEMI ARTHROPLASTY ;  Surgeon: Mauri Pole, MD;  Location: [facility];  Service: Orthopedics;  Laterality: Left;    There were no vitals filed for this visit.  Visit Diagnosis:  Parkinson's disease ([place])  Generalized weakness  Abnormality of gait  Impaired functional mobility, balance, gait, and endurance  Risk for falls  Poor posture      Subjective Assessment - 08/05/15 1102    Subjective Doing fairly well today but didn't get much sleep last night and feeling like she is just dragging.    Currently in Pain? No/denies                         [facility] Adult PT Treatment/Exercise - 08/05/15 0001    Ambulation/Gait  Ambulation/Gait Yes   Ambulation/Gait Assistance 5: Supervision   Ambulation Distance (Feet) 260 Feet   Gait Comments Ambulating 130 feet with rollator and 130 feet without. Incresed instability without device but no loss of balance. Occasionally reaching for walls and furniture for additional support.    Static Standing Balance   Static Standing - Comment/# of Minutes Staggered stance with increasing length of step and progression of using hands to no hands to UE movement patterns. Repeating bilaterally   Knee/Hip Exercises: Standing   Hip Flexion Stengthening;Both;1 set;10 reps   Hip Abduction Stengthening;Both;1 set;10 reps   Other Standing Knee Exercises standing with wide base of support- lateral weight shifts   Knee/Hip Exercises: Seated   Abduction/Adduction  Strengthening;Both;1 set;10 reps   Abd/Adduction Limitations seated isometric hip abduction (caution with Lt hip)   Sit to Sand 1 set;10 reps;Other (comment)  minimal use of UEs, cues fwd lean.   Ankle  Exercises: Seated   Other Seated Ankle Exercises seated toe/heel raises X15 bilaterally                PT Education - 08/05/15 1201    Education provided Yes   Education Details discussing deep breathing during exercises, encouraged continuation of HEP.   Person(s) Educated Patient   Methods Explanation;Demonstration;Verbal cues;Tactile cues   Comprehension Verbalized understanding;Returned demonstration          PT Short Term Goals - 07/13/15 1709    PT SHORT TERM GOAL #1   Title Patient will be able to maintain correct posture at least 70% of the time during all functional situations and scenarios in order to improve overall mobility and reduce fall risk    Time 4   Period Weeks   Status New   PT SHORT TERM GOAL #2   Title Patient will be able to complete bed mobility with mod(I), correct sequencnig in order to enhance safety in mobility at home    Time 4   Period Weeks   Status New   PT SHORT TERM GOAL #3   Title Patient will be able to ambulate at least 882ft with LRAD, no rest breaks and no unsteadiness, in order to demonstrate improved mobility with reduced fall risk    Time 4   Period Weeks   Status New   PT SHORT TERM GOAL #4   Title Patient will score at least 48 on BERG balance test in order to demonstrate reduced unsteadiness and fall risk    Time 4   Period Weeks   Status New   PT SHORT TERM GOAL #5   Title Patient will consistently and correctly perform appropriate HEP, to be updated PRN    Time 4   Period Weeks   Status New           PT Long Term Goals - 07/13/15 1713    PT LONG TERM GOAL #1   Title Patient to demonstrate strength 4/5 in all tested muscle groups in order to improve functional stability and mobility, reduce fall risk    Time 8   Period Weeks   Status New   PT LONG TERM GOAL #2   Title Patient to report no falls, LOB, or close calls with falls within the past 4 weeks in order to demonstrate improved functional and improved  safety at home and in community    Time 8   Period Weeks   Status New   PT LONG TERM GOAL #3   Title Patient to be able  to ambulate at least 125ft during 6MWT with LRAD, no rest breaks and no unsteadiness, in order to demosntrate improved general function and readiness to perform community based tasks    Time 8   Period Weeks   Status New   PT LONG TERM GOAL #4   Title Patient to score at least 52 on BERG balance test in order to demonstrate minimal fall risk during mobility    Time 8   Period Weeks   Status New   PT LONG TERM GOAL #5   Title Patient to be performing at least 30 minutes of exercise, at least 5 days per week, in order to maintain functional gains and to promote improved health habits    Time 8   Period Weeks   Status New               Plan - 08/05/15 1203    Clinical Impression Statement Patient coming into session with reports of note sleeping well and feeling fatigued. Rest breaks taken during session as needed. Session included activities encouraging wide base of support and big motions. Increasing movement challenges with use of upper extremities. Frequent cues for deep breathing throughout session. The patient denied any complaints following the session. Will continue to progress as tolerated.    Clinical Impairments Affecting Rehab Potential stress fracture L LE    PT Next Visit Plan review HEP and goals; postural stretching, functional strengthening, balance and gait training    PT Home Exercise Plan review as needed.   Consulted and Agree with Plan of Care Patient        Problem List Patient Active Problem List   Diagnosis Date Noted  . Seizures ([place]) 06/14/2015  . Degenerative arthritis of thumb 03/03/2015  . Abnormality of gait 08/12/2014  . Parkinsonism ([place]) 08/12/2014  . Stroke ([place]) 08/12/2014  . Muscle weakness (generalized) 02/22/2014  . Stiffness of left knee 02/22/2014  . Hamstring tightness of left lower extremity 02/22/2014  .  Pain in joint, pelvic region and thigh 02/22/2014  . Difficulty walking 04/07/2013  . Osteoporosis 03/10/2013  . Anemia 03/10/2013  . Mild malnutrition ([place]) 03/10/2013  . S/P left hip hemiarthroplasty 12/30/2012  . Osteoporosis with pathological fracture with delayed healing 11/25/2012  . Pedal edema 10/27/2012  . Fracture of sacrum ([place]) 10/02/2012  . Stricture and stenosis of esophagus 09/24/2011  . Hyperlipidemia 11/03/2008  . CAD, UNSPECIFIED SITE 11/03/2008    Cassell Clement, PT, CSCS Pager [phone]  08/05/2015, 12:08 PM  [facility] [place], [place], [address] Phone: [phone]   Fax:  [phone]  Name: CARIYAH FERDINAND MRN: [id number] Date of Birth: 1934/09/04

## 2015-08-08 ENCOUNTER — Encounter: Payer: Self-pay | Admitting: Family Medicine

## 2015-08-08 ENCOUNTER — Ambulatory Visit: Payer: Medicare Other | Admitting: Family Medicine

## 2015-08-08 ENCOUNTER — Ambulatory Visit (INDEPENDENT_AMBULATORY_CARE_PROVIDER_SITE_OTHER): Payer: Medicare Other | Admitting: Family Medicine

## 2015-08-08 VITALS — BP 108/68 | Ht 66.0 in | Wt 104.0 lb

## 2015-08-08 DIAGNOSIS — I63511 Cerebral infarction due to unspecified occlusion or stenosis of right middle cerebral artery: Secondary | ICD-10-CM

## 2015-08-08 DIAGNOSIS — R319 Hematuria, unspecified: Secondary | ICD-10-CM | POA: Diagnosis not present

## 2015-08-08 DIAGNOSIS — N3001 Acute cystitis with hematuria: Secondary | ICD-10-CM | POA: Diagnosis not present

## 2015-08-08 DIAGNOSIS — K5900 Constipation, unspecified: Secondary | ICD-10-CM | POA: Diagnosis not present

## 2015-08-08 LAB — POCT URINALYSIS DIPSTICK
PH UA: 6
SPEC GRAV UA: 1.015

## 2015-08-08 NOTE — Progress Notes (Signed)
   Subjective:    Patient ID: Samantha Hudson, female    DOB: [birth year], 81 y.o.   MRN: [id number]  HPI Patient arrives to recheck on hematuria- better-no more blood noted She feels she is doing much better. Took the antibiotic for the culture proven UTI. Constipation- tried Miralax, peri colace, stimulant  BM 3 days, softer if using the medicines,  Review of Systems    patient denies high fever chills sweats dysuria urinary frequency Objective:   Physical Exam  Lungs clear hearts regular extremities no edema weight stable      Assessment & Plan:  We discussed the constipation I believe this is probably related to inactivity and not enough fluids she will use the MiraLAX fluids in more activity if ongoing troubles will need further testing patient does not want prescription medicine for constipation.  UTI resolved  Hematuria resolved if further trouble patient will need referral to urology Urine culture pending

## 2015-08-09 ENCOUNTER — Ambulatory Visit (HOSPITAL_COMMUNITY): Payer: Medicare Other | Admitting: Physical Therapy

## 2015-08-09 DIAGNOSIS — R269 Unspecified abnormalities of gait and mobility: Secondary | ICD-10-CM

## 2015-08-09 DIAGNOSIS — Z7409 Other reduced mobility: Secondary | ICD-10-CM

## 2015-08-09 DIAGNOSIS — Z9181 History of falling: Secondary | ICD-10-CM

## 2015-08-09 DIAGNOSIS — R531 Weakness: Secondary | ICD-10-CM

## 2015-08-09 DIAGNOSIS — G20 Parkinson's disease: Secondary | ICD-10-CM | POA: Diagnosis not present

## 2015-08-09 DIAGNOSIS — R293 Abnormal posture: Secondary | ICD-10-CM

## 2015-08-09 NOTE — Therapy (Signed)
[facility] [address] [place], [place], [address] Phone: [phone]   Fax:  [phone]  Physical Therapy Treatment  Patient Details  Name: Samantha Hudson MRN: [id number] Date of Birth: 1935-02-08 Referring Provider: Marcial Pacas   Encounter Date: 08/09/2015      PT End of Session - 08/09/15 1655    Visit Number 4   Number of Visits 16   Date for PT Re-Evaluation 08/10/15   Authorization Type Medicare and BCBS    Authorization Time Period 07/13/15 to 09/07/15   Authorization - Visit Number 4   Authorization - Number of Visits 10   PT Start Time 1300   PT Stop Time 1346   PT Time Calculation (min) 46 min   Equipment Utilized During Treatment Gait belt   Activity Tolerance Patient tolerated treatment well   Behavior During Therapy [facility] for tasks assessed/performed      Past Medical History  Diagnosis Date  . Hyperlipemia   . IBS (irritable bowel syndrome)   . Osteoporosis   . Fibromyalgia   . Glaucoma     HAD LASER SURGERY - NO EYE DROPS REQUIRED  . Globus hystericus   . Fibromyalgia   . Meningioma ([place])   . CAD (coronary artery disease)     HEART STENT PLACED ABOUT 2000- NO LONGER SEES CARDIOLOGIST; NO C/O OF CHEST PAINS OR SOB  . Heart murmur     MVP SINCE THE 60'S - TAKES ATENOLOL -  . Anxiety   . Swelling     BILATERAL FEET AND LEGS - ONSET OF SWELLING APRIL 2014  . GERD (gastroesophageal reflux disease)   . Arthritis   . Fracture   . CVA (cerebral vascular accident) ([place])     STROKE 7 [place] TO REMOVE BENIGN MENINGIOMA - HAS LEFT SIDED WEAKNESS AND A LITTLE NUMBNESS  . Pain     "EXCRUCIATING PAIN" LEFT HIP - HAS A FRACTURE - IS CONFINED TO W/C AT HOME - NO WEIGHT BEARING LEFT HIP.  Marland Kitchen Parkinson's disease [facility])     Past Surgical History  Procedure Laterality Date  . Brain surgery      tumor removed  . Tonsillectomy    . Partial hysterectomy    . Glaucoma repair    . Cataract  extraction    . Kidney surgery      left - HX ENLARGED LEFT KIDNEY ON XRAY - SURGERY WAS DONE ON URETER   . Colonoscopy    . Coronary angioplasty    . Hip arthroplasty Left 12/29/2012    Procedure: LEFT HIP HEMI ARTHROPLASTY ;  Surgeon: Mauri Pole, MD;  Location: [facility];  Service: Orthopedics;  Laterality: Left;    There were no vitals filed for this visit.  Visit Diagnosis:  Parkinson's disease ([place])  Generalized weakness  Abnormality of gait  Impaired functional mobility, balance, gait, and endurance  Risk for falls  Poor posture      Subjective Assessment - 08/09/15 1303    Subjective Patient reports she is feeling better today; reports nothing major going on recently, just sometimes feels like she is "running out of fuel". Foot is feeling OK today.    Pertinent History CAD, hx of CVA, PD, osteoporosis, L hip replacement, hx of possible seizure, fibromyalgia    Patient Stated Goals less pain in L hip and foot and back; improve balance    Currently in Pain? No/denies  [facility] Adult PT Treatment/Exercise - 08/09/15 0001    Lumbar Exercises: Aerobic   Tread Mill x4 minutes at 1.5MPH, 0% incline, supervised    Lumbar Exercises: Standing   Other Standing Lumbar Exercises PWR! up, rock, twist, and step 1x10 with min guard    Lumbar Exercises: Supine   Other Supine Lumbar Exercises supine PWR! exercises 1x10    Lumbar Exercises: Prone   Other Prone Lumbar Exercises prone PWR! moves 2/4 1x10 (power up and power rock)   Knee/Hip Exercises: Seated   Other Seated Knee/Hip Exercises PWR! seated sequence 1x10 each of 4 ways                 PT Education - 08/09/15 1654    Education provided Yes   Education Details seated PWR! exercises for HEP    Person(s) Educated Patient   Methods Explanation   Comprehension Verbalized understanding          PT Short Term Goals - 07/13/15 1709    PT SHORT TERM GOAL #1   Title Patient  will be able to maintain correct posture at least 70% of the time during all functional situations and scenarios in order to improve overall mobility and reduce fall risk    Time 4   Period Weeks   Status New   PT SHORT TERM GOAL #2   Title Patient will be able to complete bed mobility with mod(I), correct sequencnig in order to enhance safety in mobility at home    Time 4   Period Weeks   Status New   PT SHORT TERM GOAL #3   Title Patient will be able to ambulate at least 819ft with LRAD, no rest breaks and no unsteadiness, in order to demonstrate improved mobility with reduced fall risk    Time 4   Period Weeks   Status New   PT SHORT TERM GOAL #4   Title Patient will score at least 48 on BERG balance test in order to demonstrate reduced unsteadiness and fall risk    Time 4   Period Weeks   Status New   PT SHORT TERM GOAL #5   Title Patient will consistently and correctly perform appropriate HEP, to be updated PRN    Time 4   Period Weeks   Status New           PT Long Term Goals - 07/13/15 1713    PT LONG TERM GOAL #1   Title Patient to demonstrate strength 4/5 in all tested muscle groups in order to improve functional stability and mobility, reduce fall risk    Time 8   Period Weeks   Status New   PT LONG TERM GOAL #2   Title Patient to report no falls, LOB, or close calls with falls within the past 4 weeks in order to demonstrate improved functional and improved safety at home and in community    Time 8   Period Weeks   Status New   PT LONG TERM GOAL #3   Title Patient to be able to ambulate at least 1270ft during 6MWT with LRAD, no rest breaks and no unsteadiness, in order to demosntrate improved general function and readiness to perform community based tasks    Time 8   Period Weeks   Status New   PT LONG TERM GOAL #4   Title Patient to score at least 52 on BERG balance test in order to demonstrate minimal fall risk during mobility    Time  8   Period Weeks    Status New   PT LONG TERM GOAL #5   Title Patient to be performing at least 30 minutes of exercise, at least 5 days per week, in order to maintain functional gains and to promote improved health habits    Time 8   Period Weeks   Status New               Plan - 08/09/15 1655    Clinical Impression Statement Patient arrived today reporting she is feeling better, sleeping better and has a little more energy. Continued with PWR! sequences, noting especial difficulty with prone PWR! sequences with physical signs of exertion despite patient reporting that "...they don't feel too bad". Patient imprving with seated and standing PWR! exercises, appears ready for progression to more advanced flow with these positions. Ended session by  introducing treadmill based exericse for 4 minutes at 1.5MPH, which appeared too easy for patient and PT recommends progression of diffcilty with this next time its performed.    Pt will benefit from skilled therapeutic intervention in order to improve on the following deficits Abnormal gait;Decreased strength;Decreased balance;Decreased mobility;Difficulty walking;Decreased coordination;Postural dysfunction;Impaired flexibility   Rehab Potential Excellent   Clinical Impairments Affecting Rehab Potential stress fracture L LE    PT Frequency 2x / week   PT Duration 8 weeks   PT Treatment/Interventions ADLs/Self Care Home Management;DME Instruction;Gait training;Stair training;Functional mobility training;Therapeutic activities;Therapeutic exercise;Balance training;Neuromuscular re-education;Patient/family education;Manual techniques;Energy conservation;Taping   PT Next Visit Plan Re-assess next session. continue PWR! work with progression of flows in standing/isitting; continue to challenge balance and functional activity tolerance by progressing treadmill settings.    PT Home Exercise Plan review as needed.   Consulted and Agree with Plan of Care Patient         Problem List Patient Active Problem List   Diagnosis Date Noted  . Constipation 08/08/2015  . Seizures ([place]) 06/14/2015  . Degenerative arthritis of thumb 03/03/2015  . Abnormality of gait 08/12/2014  . Parkinsonism ([place]) 08/12/2014  . Stroke ([place]) 08/12/2014  . Muscle weakness (generalized) 02/22/2014  . Stiffness of left knee 02/22/2014  . Hamstring tightness of left lower extremity 02/22/2014  . Pain in joint, pelvic region and thigh 02/22/2014  . Difficulty walking 04/07/2013  . Osteoporosis 03/10/2013  . Anemia 03/10/2013  . Mild malnutrition ([employer]) 03/10/2013  . S/P left hip hemiarthroplasty 12/30/2012  . Osteoporosis with pathological fracture with delayed healing 11/25/2012  . Pedal edema 10/27/2012  . Fracture of sacrum ([place]) 10/02/2012  . Stricture and stenosis of esophagus 09/24/2011  . Hyperlipidemia 11/03/2008  . CAD, UNSPECIFIED SITE 11/03/2008    Deniece Ree PT, DPT [phone]  [facility] [address] [place], [place], [address] Phone: [phone]   Fax:  [phone]  Name: Samantha Hudson MRN: [id number] Date of Birth: 01/15/1935

## 2015-08-10 ENCOUNTER — Telehealth: Payer: Self-pay | Admitting: Family Medicine

## 2015-08-10 ENCOUNTER — Encounter: Payer: Self-pay | Admitting: Family Medicine

## 2015-08-10 LAB — URINE CULTURE: Organism ID, Bacteria: NO GROWTH

## 2015-08-10 NOTE — Telephone Encounter (Signed)
I saw her last in Jun 14 2015, she had one seizure while taking Keppra xr 750mg  qhs, also reported agitation with Keppra, the decision was made to switch her to Lamotrigine xr 100+50 mg qhs monotherapy, and tapering off keppra  We will call her to clarify her medication dosage. If she could not tolerate Lamotrigine, it is fine to go back to keppra, but she did have seizure while taking keppra xr 750mg , she probably needs at least keppra xr 1000mg  daily.

## 2015-08-10 NOTE — Telephone Encounter (Signed)
This is a question on a mutual patient that we share. Samantha Hudson relates that she tried the Lamictal and she states that between nausea and dizziness she could not tolerate it. She went back to [place]. She states that the current dose makes her feel drowsy. Her husband and herself would like to use see if you would allow her to use a lower dose of Keppra 500 mg. They state that she has not had any seizures since you last saw her. In they feel that a lower dose of Keppra might agree with her. If you feel that it would be fine to try the 500 mg I will relay this to them. Thank you for your input-Lynisha Osuch

## 2015-08-11 ENCOUNTER — Ambulatory Visit (HOSPITAL_COMMUNITY): Payer: Medicare Other | Admitting: Physical Therapy

## 2015-08-11 ENCOUNTER — Telehealth: Payer: Self-pay | Admitting: *Deleted

## 2015-08-11 DIAGNOSIS — Z7409 Other reduced mobility: Secondary | ICD-10-CM

## 2015-08-11 DIAGNOSIS — G20 Parkinson's disease: Secondary | ICD-10-CM | POA: Diagnosis not present

## 2015-08-11 DIAGNOSIS — R269 Unspecified abnormalities of gait and mobility: Secondary | ICD-10-CM

## 2015-08-11 DIAGNOSIS — Z9181 History of falling: Secondary | ICD-10-CM

## 2015-08-11 DIAGNOSIS — R531 Weakness: Secondary | ICD-10-CM

## 2015-08-11 DIAGNOSIS — R293 Abnormal posture: Secondary | ICD-10-CM

## 2015-08-11 NOTE — Telephone Encounter (Addendum)
Patient notified that Neurology will be contacting her concerning her seizure meds. Patient verbalized understanding.

## 2015-08-11 NOTE — Telephone Encounter (Signed)
Please let the patient know that we communicated with Dr. Krista Blue. Please call Samantha Hudson let her know that neurology will be calling her to try to clarify what needs to be done regarding her Keppra.

## 2015-08-11 NOTE — Therapy (Signed)
[facility] [place], [place], [address] Phone: [phone]   Fax:  [phone]  Physical Therapy Treatment (Re-Assessment)  Patient Details  Name: Samantha Hudson MRN: [id number] Date of Birth: 03-Oct-1934 Referring Provider: Marcial Pacas   Encounter Date: 08/11/2015      PT End of Session - 08/11/15 1353    Visit Number 5   Number of Visits 16   Date for PT Re-Evaluation 09/08/15   Authorization Type Medicare and BCBS (G-code done 5th session)   Authorization Time Period 07/13/15 to 09/07/15   Authorization - Visit Number 5   Authorization - Number of Visits 15   PT Start Time 1302   PT Stop Time 1345   PT Time Calculation (min) 43 min   Equipment Utilized During Treatment Gait belt   Activity Tolerance Patient tolerated treatment well   Behavior During Therapy [facility] for tasks assessed/performed      Past Medical History  Diagnosis Date  . Hyperlipemia   . IBS (irritable bowel syndrome)   . Osteoporosis   . Fibromyalgia   . Glaucoma     HAD LASER SURGERY - NO EYE DROPS REQUIRED  . Globus hystericus   . Fibromyalgia   . Meningioma ([place])   . CAD (coronary artery disease)     HEART STENT PLACED ABOUT 2000- NO LONGER SEES CARDIOLOGIST; NO C/O OF CHEST PAINS OR SOB  . Heart murmur     MVP SINCE THE 60'S - TAKES ATENOLOL -  . Anxiety   . Swelling     BILATERAL FEET AND LEGS - ONSET OF SWELLING APRIL 2014  . GERD (gastroesophageal reflux disease)   . Arthritis   . Fracture   . CVA (cerebral vascular accident) ([place])     STROKE 7 [place] TO REMOVE BENIGN MENINGIOMA - HAS LEFT SIDED WEAKNESS AND A LITTLE NUMBNESS  . Pain     "EXCRUCIATING PAIN" LEFT HIP - HAS A FRACTURE - IS CONFINED TO W/C AT HOME - NO WEIGHT BEARING LEFT HIP.  Marland Kitchen Parkinson's disease [facility])     Past Surgical History  Procedure Laterality Date  . Brain surgery      tumor removed  . Tonsillectomy    . Partial hysterectomy     . Glaucoma repair    . Cataract extraction    . Kidney surgery      left - HX ENLARGED LEFT KIDNEY ON XRAY - SURGERY WAS DONE ON URETER   . Colonoscopy    . Coronary angioplasty    . Hip arthroplasty Left 12/29/2012    Procedure: LEFT HIP HEMI ARTHROPLASTY ;  Surgeon: Mauri Pole, MD;  Location: [facility];  Service: Orthopedics;  Laterality: Left;    There were no vitals filed for this visit.  Visit Diagnosis:  Parkinson's disease ([place])  Generalized weakness  Abnormality of gait  Impaired functional mobility, balance, gait, and endurance  Risk for falls  Poor posture      Subjective Assessment - 08/11/15 1305    Subjective Patient arrives reporting that she is feeling better, has been working on the [place] moves at home and has  couple questions, otherwise doing well   Pertinent History CAD, hx of CVA, PD, osteoporosis, L hip replacement, hx of possible seizure, fibromyalgia    Currently in Pain? Yes   Pain Score 3    Pain Location Buttocks   Pain Orientation Left   Pain Descriptors / Indicators  Dull;Aching   Pain Type Acute pain   Pain Radiating Towards none    Pain Onset In the past 7 days   Pain Frequency Constant   Aggravating Factors  stepping motion in PWR moves    Pain Relieving Factors walking   Effect of Pain on Daily Activities none             [facility] PT Assessment - 08/11/15 0001    Strength   Right Hip Flexion 3+/5   Right Hip Extension 2+/5   Right Hip ABduction 3+/5   Left Hip Flexion 4-/5   Left Hip Extension 2/5   Left Hip ABduction 3/5   Right Knee Flexion 4-/5   Right Knee Extension 4/5   Left Knee Flexion 4-/5   Left Knee Extension 4-/5   Right Ankle Dorsiflexion 4/5   Left Ankle Dorsiflexion 4/5   6 minute walk test results    Aerobic Endurance Distance Walked 621   Endurance additional comments 6MWT; one rest break    [employer] to Stand Able to stand without using hands and stabilize independently   Standing Unsupported  Able to stand safely 2 minutes   Sitting with Back Unsupported but Feet Supported on Floor or Stool Able to sit safely and securely 2 minutes   Stand to Sit Sits safely with minimal use of hands   Transfers Able to transfer safely, minor use of hands   Standing Unsupported with Eyes Closed Able to stand 10 seconds safely   Standing Ubsupported with Feet Together Able to place feet together independently and stand 1 minute safely   From Standing, Reach Forward with Outstretched Arm Can reach forward >12 cm safely (5")   From Standing Position, Pick up Object from Floor Able to pick up shoe, needs supervision   From Standing Position, Turn to Look Behind Over each Shoulder Looks behind one side only/other side shows less weight shift   Turn 360 Degrees Able to turn 360 degrees safely but slowly   Standing Unsupported, Alternately Place Feet on Step/Stool Able to stand independently and complete 8 steps >20 seconds   Standing Unsupported, One Foot in Front Able to take small step independently and hold 30 seconds   Standing on One Leg Able to lift leg independently and hold 5-10 seconds   Total Score 47                             PT Education - 08/11/15 1352    Education provided Yes   Education Details progress with skilled PT services, plan of care moving forward, modification to PWR! HEP at home    Person(s) Educated Patient   Methods Explanation;Demonstration   Comprehension Verbalized understanding          PT Short Term Goals - 08/11/15 1334    PT SHORT TERM GOAL #1   Title Patient will be able to maintain correct posture at least 70% of the time during all functional situations and scenarios in order to improve overall mobility and reduce fall risk    Time 4   Period Weeks   Status On-going   PT SHORT TERM GOAL #2   Title Patient will be able to complete bed mobility with mod(I), correct sequencnig in order to enhance safety in mobility at home    Time 4    Period Weeks   Status Achieved   PT SHORT TERM GOAL #3  Title Patient will be able to ambulate at least 819ft with LRAD, no rest breaks and no unsteadiness, in order to demonstrate improved mobility with reduced fall risk    Time 4   Period Weeks   Status On-going   PT SHORT TERM GOAL #4   Title Patient will score at least 48 on BERG balance test in order to demonstrate reduced unsteadiness and fall risk    Baseline 3/23- 47   Time 4   Period Weeks   Status On-going   PT SHORT TERM GOAL #5   Title Patient will consistently and correctly perform appropriate HEP, to be updated PRN    Baseline 3/23- has been doing them every day    Time 4   Period Weeks   Status Achieved           PT Long Term Goals - 08/11/15 1335    PT LONG TERM GOAL #1   Title Patient to demonstrate strength 4/5 in all tested muscle groups in order to improve functional stability and mobility, reduce fall risk    Time 8   Period Weeks   Status On-going   PT LONG TERM GOAL #2   Title Patient to report no falls, LOB, or close calls with falls within the past 4 weeks in order to demonstrate improved functional and improved safety at home and in community    Time 8   Period Weeks   Status Achieved   PT LONG TERM GOAL #3   Title Patient to be able to ambulate at least 1257ft during 6MWT with LRAD, no rest breaks and no unsteadiness, in order to demosntrate improved general function and readiness to perform community based tasks    Time 8   Period Weeks   Status On-going   PT LONG TERM GOAL #4   Title Patient to score at least 52 on BERG balance test in order to demonstrate minimal fall risk during mobility    Time 8   Period Weeks   Status On-going   PT LONG TERM GOAL #5   Title Patient to be performing at least 30 minutes of exercise, at least 5 days per week, in order to maintain functional gains and to promote improved health habits    Time 8   Period Weeks   Status On-going                Plan - 08/11/15 1354    Clinical Impression Statement Re-assessment performed today. Patient shows improvement in balance tasks, mild improvement in strength, and improve in gait patten with mild improvement in distance; it is important to note that patient's progress was slowed somewhat by her being sick with and recovering from the flu over the course of recent weeks. Patietn very much enjoys the PWR! program progressions and is very motivated to work on her HEP and overall  activity levels at home. Patient did have some concerns over medication and was encouraged to speak to MD about this. At this time recommend ongoing skilled PT services in order to address functional limitations and to attempt to reach optimal level of function.    Pt will benefit from skilled therapeutic intervention in order to improve on the following deficits Abnormal gait;Decreased strength;Decreased balance;Decreased mobility;Difficulty walking;Decreased coordination;Postural dysfunction;Impaired flexibility   Rehab Potential Excellent   Clinical Impairments Affecting Rehab Potential stress fracture L LE    PT Frequency 2x / week   PT Duration 4 weeks   PT Treatment/Interventions ADLs/Self Care Home  Management;DME Instruction;Gait training;Stair training;Functional mobility training;Therapeutic activities;Therapeutic exercise;Balance training;Neuromuscular re-education;Patient/family education;Manual techniques;Energy conservation;Taping   PT Next Visit Plan  continue PWR! work with progression of flows in standing/isitting; continue to challenge balance and functional activity tolerance by progressing treadmill settings.    PT Home Exercise Plan review as needed.   Consulted and Agree with Plan of Care Patient          G-Codes - 2015-08-20 1359    Functional Assessment Tool Used Based on skilled clinical assessment of strength, gait, balance, functional mobility, functional activity tolerance    Functional Limitation  Mobility: Walking and moving around   Mobility: Walking and Moving Around Current Status [phone]) At least 20 percent but less than 40 percent impaired, limited or restricted   Mobility: Walking and Moving Around Goal Status [phone]) At least 1 percent but less than 20 percent impaired, limited or restricted      Problem List Patient Active Problem List   Diagnosis Date Noted  . Constipation 08/08/2015  . Seizures ([place]) 06/14/2015  . Degenerative arthritis of thumb 03/03/2015  . Abnormality of gait 08/12/2014  . Parkinsonism ([place]) 08/12/2014  . Stroke ([place]) 08/12/2014  . Muscle weakness (generalized) 02/22/2014  . Stiffness of left knee 02/22/2014  . Hamstring tightness of left lower extremity 02/22/2014  . Pain in joint, pelvic region and thigh 02/22/2014  . Difficulty walking 04/07/2013  . Osteoporosis 03/10/2013  . Anemia 03/10/2013  . Mild malnutrition ([place]) 03/10/2013  . S/P left hip hemiarthroplasty 12/30/2012  . Osteoporosis with pathological fracture with delayed healing 11/25/2012  . Pedal edema 10/27/2012  . Fracture of sacrum ([place]) 10/02/2012  . Stricture and stenosis of esophagus 09/24/2011  . Hyperlipidemia 11/03/2008  . CAD, UNSPECIFIED SITE 11/03/2008    Deniece Ree PT, DPT [phone]  [facility] [address] [place], [place], [address] Phone: [phone]   Fax:  [phone]  Name: Samantha Hudson MRN: [id number] Date of Birth: March 17, 1935

## 2015-08-11 NOTE — Telephone Encounter (Signed)
Spoke to patient's husband on HIPPA - states she only took one dose of lamotrigine XR 50mg  and refused to take the medication again - said she "did not feel right" - he was unable to tell me exactly what symptoms the medication caused. She is continuing to take Keppra XR 750mg , one tablet at bedtime.  He feel her agitation is much worse and contributes it to the Keppra XR.  A follow up appt has been scheduled for her to discuss treatment options.

## 2015-08-13 ENCOUNTER — Emergency Department (HOSPITAL_COMMUNITY): Payer: Medicare Other

## 2015-08-13 ENCOUNTER — Other Ambulatory Visit: Payer: Self-pay

## 2015-08-13 ENCOUNTER — Observation Stay (HOSPITAL_COMMUNITY)
Admission: EM | Admit: 2015-08-13 | Discharge: 2015-08-14 | Disposition: A | Discharge status: Home / Self Care | Payer: Medicare Other | Attending: Internal Medicine | Admitting: Internal Medicine

## 2015-08-13 ENCOUNTER — Encounter (HOSPITAL_COMMUNITY): Payer: Self-pay

## 2015-08-13 ENCOUNTER — Other Ambulatory Visit (HOSPITAL_COMMUNITY): Payer: Self-pay

## 2015-08-13 DIAGNOSIS — I2510 Atherosclerotic heart disease of native coronary artery without angina pectoris: Secondary | ICD-10-CM | POA: Insufficient documentation

## 2015-08-13 DIAGNOSIS — Z79899 Other long term (current) drug therapy: Secondary | ICD-10-CM | POA: Insufficient documentation

## 2015-08-13 DIAGNOSIS — R0789 Other chest pain: Secondary | ICD-10-CM | POA: Diagnosis not present

## 2015-08-13 DIAGNOSIS — M810 Age-related osteoporosis without current pathological fracture: Secondary | ICD-10-CM | POA: Insufficient documentation

## 2015-08-13 DIAGNOSIS — G20 Parkinson's disease: Secondary | ICD-10-CM | POA: Insufficient documentation

## 2015-08-13 DIAGNOSIS — R0602 Shortness of breath: Secondary | ICD-10-CM | POA: Diagnosis not present

## 2015-08-13 DIAGNOSIS — E785 Hyperlipidemia, unspecified: Secondary | ICD-10-CM | POA: Diagnosis not present

## 2015-08-13 DIAGNOSIS — R569 Unspecified convulsions: Secondary | ICD-10-CM

## 2015-08-13 DIAGNOSIS — R079 Chest pain, unspecified: Secondary | ICD-10-CM

## 2015-08-13 MED ORDER — ASPIRIN 81 MG PO CHEW
324.0000 mg | CHEWABLE_TABLET | Freq: Once | ORAL | Status: AC
  Administered 2015-08-13: 324 mg via ORAL
  Filled 2015-08-13: qty 4

## 2015-08-13 NOTE — ED Notes (Signed)
Pt states she began having difficulty breathing and some "chest discomfort" after sitting. Describes left sided chest pain "behind the nipple" and between shoulder blades.

## 2015-08-13 NOTE — ED Notes (Signed)
She was out walking around the neighborhood, when she got back to the house she was having some shortness of breath.  Hx. Of CAD.  No chest pain. Feeling anxious.

## 2015-08-13 NOTE — ED Provider Notes (Signed)
By signing my name below, I, Helane Gunther, attest that this documentation has been prepared under the direction and in the presence of [employer], DO. Electronically Signed: Helane Gunther, ED Scribe. 08/13/2015. 11:46 PM.  TIME SEEN: 11:37 PM   CHIEF COMPLAINT: Shortness of Breath  HPI: Samantha Hudson is a 81 y.o. female with a PMHX of Parkinson's disease, CVA, HLD, CAD, heart murmur (MVP), GERD, and anxiety brought in by ambulance who presents to the Emergency Department complaining of SOB onset this evening. Per husband, pt was out walking tonight and came back home c/o SOB and wanting some oxygen, which was not available at home. Pt states she was tired after her walk and sat down for a time before standing up at around 9 PM tonight and feeling increasingly short of breath. She reports associated left-sided chest pain just above the breast. Describes it as a heaviness. She notes having similar pains in the past with crossing her arms over her chest. States she did have some slightly increased and different chest pain compared to her normal. Per husband, pt has a stent in her left anterior descending artery which was done in 2000. Pt states she had a stress test done by her cardiologist at that time that was abnormal. She denies having any symptoms at that time. She notes her PCP is Dr Sallee Lange. Pt denies n/v, diaphoresis, and dizziness. States she has no symptoms currently. Has had mild cough but no fever. No lower external swelling or pain. Pt is allergic to penicillins.   ROS: See HPI (limited as pt is a poor historian) Constitutional: no fever  Eyes: no drainage  ENT: no runny nose   Cardiovascular: Left-sided chest pain  Resp: SOB  GI: no vomiting GU: no dysuria Integumentary: no rash  Allergy: no hives  Musculoskeletal: no leg swelling  Neurological: no slurred speech ROS otherwise negative  PAST MEDICAL HISTORY/PAST SURGICAL HISTORY:  Past Medical History  Diagnosis  Date  . Hyperlipemia   . IBS (irritable bowel syndrome)   . Osteoporosis   . Fibromyalgia   . Glaucoma     HAD LASER SURGERY - NO EYE DROPS REQUIRED  . Globus hystericus   . Fibromyalgia   . Meningioma ([place])   . CAD (coronary artery disease)     HEART STENT PLACED ABOUT 2000- NO LONGER SEES CARDIOLOGIST; NO C/O OF CHEST PAINS OR SOB  . Heart murmur     MVP SINCE THE 60'S - TAKES ATENOLOL -  . Anxiety   . Swelling     BILATERAL FEET AND LEGS - ONSET OF SWELLING APRIL 2014  . GERD (gastroesophageal reflux disease)   . Arthritis   . Fracture   . CVA (cerebral vascular accident) ([place])     STROKE 7 [place] TO REMOVE BENIGN MENINGIOMA - HAS LEFT SIDED WEAKNESS AND A LITTLE NUMBNESS  . Pain     "EXCRUCIATING PAIN" LEFT HIP - HAS A FRACTURE - IS CONFINED TO W/C AT HOME - NO WEIGHT BEARING LEFT HIP.  Marland Kitchen Parkinson's disease ([place])     MEDICATIONS:  Prior to Admission medications   Medication Sig Start Date End Date Taking? Authorizing Provider  atenolol (TENORMIN) 25 MG tablet TAKE (1) TABLET TWICE A DAY. 03/21/15   Kathyrn Drown, MD  atorvastatin (LIPITOR) 10 MG tablet TAKE 1 TABLET AT BEDTIME. 06/23/15   Kathyrn Drown, MD  calcium-vitamin D (OSCAL WITH D) 500-200 MG-UNIT per tablet Take  1 tablet by mouth 3 (three) times daily.     Historical Provider, MD  carbidopa-levodopa (SINEMET IR) 25-100 MG tablet TAKE (2) TABLETS THREE TIMES DAILY. 07/18/15   Marcial Pacas, MD  cefPROZIL (CEFZIL) 500 MG tablet Take 1 tablet (500 mg total) by mouth 2 (two) times daily. 07/27/15   Kathyrn Drown, MD  cholecalciferol (VITAMIN D) 400 UNITS TABS tablet Take 400 Units by mouth daily.    Historical Provider, MD  dipyridamole-aspirin (AGGRENOX) 200-25 MG 12hr capsule TAKE 1 CAPSULE TWICE A DAY 05/18/15   Kathyrn Drown, MD  DULoxetine (CYMBALTA) 30 MG capsule TAKE (1) CAPSULE DAILY. 07/22/15   Mikey Kirschner, MD  gabapentin (NEURONTIN) 100 MG capsule TAKE 2 CAPSULE IN THE MORNING AND 1  CAPSULE IN THE AFTERNNON 06/23/15   Kathyrn Drown, MD  gabapentin (NEURONTIN) 400 MG capsule TAKE 1 CAPSULE TWICE A DAY 05/04/15   Kathyrn Drown, MD  hydroxypropyl methylcellulose / hypromellose (ISOPTO TEARS / GONIOVISC) 2.5 % ophthalmic solution Place 1 drop into both eyes as needed for dry eyes. Reported on 06/20/2015    Historical Provider, MD  Levetiracetam (KEPPRA XR) 750 MG TB24 Take 1 tablet (750 mg total) by mouth at bedtime. 03/14/15   Marcial Pacas, MD  polyethylene glycol powder (GLYCOLAX/MIRALAX) powder Take 17 g by mouth 2 (two) times daily. Patient taking differently: Take 17 g by mouth daily as needed for mild constipation.  01/01/13   Danae Orleans, PA-C  potassium chloride (K-DUR) 10 MEQ tablet TAKE (1) TABLET TWICE A DAY. 06/23/15   Kathyrn Drown, MD  ranitidine (ZANTAC) 300 MG tablet TAKE 1 TABLET DAILY AS DIRECTED 06/23/15   Kathyrn Drown, MD  torsemide (DEMADEX) 20 MG tablet TAKE 1/2 TABLET EACH MORNING. 04/04/15   Kathyrn Drown, MD  traMADol (ULTRAM) 50 MG tablet Take 1 tablet (50 mg total) by mouth every 6 (six) hours as needed for moderate pain. 04/21/15   Kathyrn Drown, MD  zoledronic acid (RECLAST) 5 MG/100ML SOLN injection Inject 5 mg into the vein once. Takes yearly in December    Historical Provider, MD    ALLERGIES:  Allergies  Allergen Reactions  . Lactose Intolerance (Gi)   . Penicillins Rash    Has patient had a PCN reaction causing immediate rash, facial/tongue/throat swelling, SOB or lightheadedness with hypotension: NO Has patient had a PCN reaction causing severe rash involving mucus membranes or skin necrosis: NO Has patient had a PCN reaction that required hospitalization: NO Has patient had a PCN reaction occurring within the last 10 years: NO If all of the above answers are "NO", then may proceed with Cephalosporin use. Pt can tolerate cephalosporins    SOCIAL HISTORY:  Social History  Substance Use Topics  . Smoking status: Never Smoker   . Smokeless  tobacco: Never Used  . Alcohol Use: No    FAMILY HISTORY: Family History  Problem Relation Age of Onset  . Liver disease Father   . Diabetes Father   . Coronary artery disease Mother     deseased  . Heart attack Mother   . Hyperlipidemia Mother   . Breast cancer Sister     x 2  . Cancer Sister     breast    EXAM: BP 165/64 mmHg  Pulse 147  Temp(Src) 98.1 F (36.7 C) (Oral)  Resp 24  Ht 5\' 5"  (1.651 m)  Wt 105 lb (47.628 kg)  BMI 17.47 kg/m2  SpO2 100% CONSTITUTIONAL: Elderly, Alert  and oriented and responds appropriately to questions. Well-appearing; well-nourished HEAD: Normocephalic EYES: Conjunctivae clear, PERRL ENT: normal nose; no rhinorrhea; moist mucous membranes NECK: Supple, no meningismus, no LAD  CARD: RRR; S1 and S2 appreciated; no murmurs, no clicks, no rubs, no gallops RESP: Normal chest excursion without splinting or tachypnea; breath sounds clear and equal bilaterally; no wheezes, no rhonchi, no rales, no hypoxia or respiratory distress, speaking full sentences ABD/GI: Normal bowel sounds; non-distended; soft, non-tender, no rebound, no guarding, no peritoneal signs BACK:  The back appears normal and is non-tender to palpation, there is no CVA tenderness EXT: Normal ROM in all joints; non-tender to palpation; no edema; normal capillary refill; no cyanosis, no calf tenderness or swelling    SKIN: Normal color for age and race; warm; no rash NEURO: Moves all extremities equally, sensation to light touch intact diffusely, cranial nerves II through XII intact PSYCH: The patient's mood and manner are appropriate. Grooming and personal hygiene are appropriate.  MEDICAL DECISION MAKING: Patient here with episode of chest pain is worse than her normal chest wall pain and shortness of breath that occurred with exertion. Pain now gone. Hemodynamically stable. Has history of CAD with stent to the LAD in 2000. EKG shows RSR prime but no other abnormality. We'll obtain  cardiac labs, chest x-ray. We'll give aspirin.  ED PROGRESS: Patient's labs are unremarkable. Negative troponin. Chest x-ray clear other than emphysematous changes. Her lungs are clear to auscultation with no wheezing and aeration.  Recommended admission for chest pain rule out. Family agrees. Will discuss with medicine for admission.   1:10 AM  Discussed patient's case with hospitalist, Dr. Shanon Brow.  Recommend admission to telemetry, observation bed.  I will place holding orders per their request. Patient and family (if present) updated with plan. Care transferred to hospitalist service.    Date: 08/13/2015 22:52  Rate: 80  Rhythm: normal sinus rhythm  QRS Axis: normal  Intervals: normal  ST/T Wave abnormalities: normal  Conduction Disutrbances: none  Narrative Interpretation: unremarkable other than RSR'       I personally performed the services described in this documentation, which was scribed in my presence. The recorded information has been reviewed and is accurate.   [place], DO 08/14/15 [phone]

## 2015-08-14 ENCOUNTER — Encounter (HOSPITAL_COMMUNITY): Payer: Self-pay

## 2015-08-14 ENCOUNTER — Observation Stay (HOSPITAL_COMMUNITY): Payer: Medicare Other

## 2015-08-14 DIAGNOSIS — R569 Unspecified convulsions: Secondary | ICD-10-CM

## 2015-08-14 DIAGNOSIS — R079 Chest pain, unspecified: Secondary | ICD-10-CM | POA: Diagnosis not present

## 2015-08-14 DIAGNOSIS — E785 Hyperlipidemia, unspecified: Secondary | ICD-10-CM | POA: Diagnosis not present

## 2015-08-14 DIAGNOSIS — R0602 Shortness of breath: Secondary | ICD-10-CM | POA: Diagnosis not present

## 2015-08-14 DIAGNOSIS — G20 Parkinson's disease: Secondary | ICD-10-CM

## 2015-08-14 DIAGNOSIS — I2510 Atherosclerotic heart disease of native coronary artery without angina pectoris: Secondary | ICD-10-CM | POA: Diagnosis present

## 2015-08-14 LAB — CBC WITH DIFFERENTIAL/PLATELET
BASOS ABS: 0 10*3/uL (ref 0.0–0.1)
Basophils Relative: 0 %
Eosinophils Absolute: 0.1 10*3/uL (ref 0.0–0.7)
Eosinophils Relative: 3 %
HEMATOCRIT: 32.1 % — AB (ref 36.0–46.0)
HEMOGLOBIN: 10.5 g/dL — AB (ref 12.0–15.0)
LYMPHS PCT: 37 %
Lymphs Abs: 1.8 10*3/uL (ref 0.7–4.0)
MCH: 29.8 pg (ref 26.0–34.0)
MCHC: 32.7 g/dL (ref 30.0–36.0)
MCV: 91.2 fL (ref 78.0–100.0)
Monocytes Absolute: 0.5 10*3/uL (ref 0.1–1.0)
Monocytes Relative: 11 %
NEUTROS PCT: 49 %
Neutro Abs: 2.4 10*3/uL (ref 1.7–7.7)
PLATELETS: 199 10*3/uL (ref 150–400)
RBC: 3.52 MIL/uL — AB (ref 3.87–5.11)
RDW: 13 % (ref 11.5–15.5)
WBC: 4.9 10*3/uL (ref 4.0–10.5)

## 2015-08-14 LAB — BASIC METABOLIC PANEL
Anion gap: 8 (ref 5–15)
BUN: 31 mg/dL — AB (ref 6–20)
CHLORIDE: 106 mmol/L (ref 101–111)
CO2: 25 mmol/L (ref 22–32)
CREATININE: 0.92 mg/dL (ref 0.44–1.00)
Calcium: 8.8 mg/dL — ABNORMAL LOW (ref 8.9–10.3)
GFR, EST NON AFRICAN AMERICAN: 58 mL/min — AB (ref >60)
Glucose, Bld: 97 mg/dL (ref 65–99)
POTASSIUM: 3.5 mmol/L (ref 3.5–5.1)
SODIUM: 139 mmol/L (ref 135–145)

## 2015-08-14 LAB — BRAIN NATRIURETIC PEPTIDE: B Natriuretic Peptide: 94 pg/mL (ref 0.0–100.0)

## 2015-08-14 LAB — TROPONIN I
Troponin I: <0.03 ng/mL (ref <0.031)
Troponin I: <0.03 ng/mL (ref <0.031)

## 2015-08-14 LAB — ECHOCARDIOGRAM COMPLETE
Height: 65 in
Weight: 1721.6 oz

## 2015-08-14 MED ORDER — IBUPROFEN 400 MG PO TABS: 400.0000 mg | ORAL_TABLET | Freq: Three times a day (TID) | ORAL | Status: DC

## 2015-08-14 MED ORDER — ASPIRIN EC 325 MG PO TBEC
325.0000 mg | DELAYED_RELEASE_TABLET | Freq: Every day | ORAL | Status: DC
  Administered 2015-08-14: 325 mg via ORAL
  Filled 2015-08-14: qty 1

## 2015-08-14 MED ORDER — ASPIRIN-DIPYRIDAMOLE ER 25-200 MG PO CP12
1.0000 | ORAL_CAPSULE | Freq: Two times a day (BID) | ORAL | Status: DC
  Administered 2015-08-14: 1 via ORAL
  Filled 2015-08-14 (×3): qty 1

## 2015-08-14 MED ORDER — ATENOLOL 25 MG PO TABS
25.0000 mg | ORAL_TABLET | Freq: Two times a day (BID) | ORAL | Status: DC
  Administered 2015-08-14: 25 mg via ORAL
  Filled 2015-08-14: qty 1

## 2015-08-14 MED ORDER — TRAMADOL HCL 50 MG PO TABS
50.0000 mg | ORAL_TABLET | Freq: Four times a day (QID) | ORAL | Status: DC | PRN
Start: 2015-08-14 — End: 2015-08-14

## 2015-08-14 MED ORDER — ACETAMINOPHEN 325 MG PO TABS: 650.0000 mg | ORAL_TABLET | ORAL | Status: DC | PRN

## 2015-08-14 MED ORDER — POTASSIUM CHLORIDE CRYS ER 10 MEQ PO TBCR
10.0000 meq | EXTENDED_RELEASE_TABLET | Freq: Two times a day (BID) | ORAL | Status: DC
  Administered 2015-08-14: 10 meq via ORAL
  Filled 2015-08-14: qty 1

## 2015-08-14 MED ORDER — CHOLECALCIFEROL 10 MCG (400 UNIT) PO TABS
400.0000 [IU] | ORAL_TABLET | Freq: Every day | ORAL | Status: DC
  Administered 2015-08-14: 400 [IU] via ORAL
  Filled 2015-08-14: qty 1

## 2015-08-14 MED ORDER — CALCIUM CARBONATE-VITAMIN D 500-200 MG-UNIT PO TABS
1.0000 | ORAL_TABLET | Freq: Three times a day (TID) | ORAL | Status: DC
  Administered 2015-08-14: 1 via ORAL
  Filled 2015-08-14: qty 1

## 2015-08-14 MED ORDER — LEVETIRACETAM 500 MG PO TABS: 750.0000 mg | ORAL_TABLET | Freq: Every day | ORAL | Status: DC

## 2015-08-14 MED ORDER — TORSEMIDE 20 MG PO TABS
10.0000 mg | ORAL_TABLET | Freq: Every day | ORAL | Status: DC
  Filled 2015-08-14: qty 1

## 2015-08-14 MED ORDER — DULOXETINE HCL 30 MG PO CPEP
30.0000 mg | ORAL_CAPSULE | Freq: Every day | ORAL | Status: DC
  Administered 2015-08-14: 30 mg via ORAL
  Filled 2015-08-14: qty 1

## 2015-08-14 MED ORDER — GABAPENTIN 100 MG PO CAPS
100.0000 mg | ORAL_CAPSULE | Freq: Every day | ORAL | Status: DC
  Administered 2015-08-14: 100 mg via ORAL
  Filled 2015-08-14: qty 1

## 2015-08-14 MED ORDER — ONDANSETRON HCL 4 MG/2ML IJ SOLN: 4.0000 mg | Freq: Four times a day (QID) | INTRAMUSCULAR | Status: DC | PRN

## 2015-08-14 MED ORDER — LEVETIRACETAM ER 750 MG PO TB24
750.0000 mg | ORAL_TABLET | Freq: Every day | ORAL | Status: DC
  Filled 2015-08-14: qty 1

## 2015-08-14 MED ORDER — GABAPENTIN 400 MG PO CAPS: 400.0000 mg | ORAL_CAPSULE | Freq: Every day | ORAL | Status: DC

## 2015-08-14 MED ORDER — FAMOTIDINE 20 MG PO TABS
40.0000 mg | ORAL_TABLET | Freq: Every day | ORAL | Status: DC
  Administered 2015-08-14: 40 mg via ORAL
  Filled 2015-08-14: qty 2

## 2015-08-14 MED ORDER — ATORVASTATIN CALCIUM 10 MG PO TABS: 10.0000 mg | ORAL_TABLET | Freq: Every day | ORAL | Status: DC

## 2015-08-14 MED ORDER — GABAPENTIN 100 MG PO CAPS
200.0000 mg | ORAL_CAPSULE | Freq: Every morning | ORAL | Status: DC
  Administered 2015-08-14: 200 mg via ORAL
  Filled 2015-08-14: qty 2

## 2015-08-14 MED ORDER — CARBIDOPA-LEVODOPA 25-100 MG PO TABS
2.0000 | ORAL_TABLET | Freq: Three times a day (TID) | ORAL | Status: DC
  Administered 2015-08-14: 2 via ORAL
  Filled 2015-08-14 (×2): qty 2

## 2015-08-14 MED ORDER — POTASSIUM CHLORIDE CRYS ER 20 MEQ PO TBCR
40.0000 meq | EXTENDED_RELEASE_TABLET | Freq: Once | ORAL | Status: AC
  Administered 2015-08-14: 40 meq via ORAL
  Filled 2015-08-14: qty 2

## 2015-08-14 NOTE — Progress Notes (Signed)
*  PRELIMINARY RESULTS* Echocardiogram 2D Echocardiogram has been performed.  Leavy Cella 08/14/2015, 9:58 AM

## 2015-08-14 NOTE — H&P (Signed)
PCP:   Sallee Lange, MD   Chief Complaint:  sob  HPI: 81 yo female h/o CVA, CAD comes in with some sob and left sided chest pain tonight that was brief and resolved with no intervention.  Pt reports since she had her stroke she has pain to the left side of her body sometimes.  The pain resolved soon after arriving to the ED.  She denies any fevers.  No cough.  No sob right now.  No swelling in her legs.  She walks with a walker at home and lives with her husband.  Pt referred for admission for her chest pain.  Review of Systems:  Positive and negative as per HPI otherwise all other systems are negative  Past Medical History: Past Medical History  Diagnosis Date  . Hyperlipemia   . IBS (irritable bowel syndrome)   . Osteoporosis   . Fibromyalgia   . Glaucoma     HAD LASER SURGERY - NO EYE DROPS REQUIRED  . Globus hystericus   . Fibromyalgia   . Meningioma ([place])   . CAD (coronary artery disease)     HEART STENT PLACED ABOUT 2000- NO LONGER SEES CARDIOLOGIST; NO C/O OF CHEST PAINS OR SOB  . Heart murmur     MVP SINCE THE 60'S - TAKES ATENOLOL -  . Anxiety   . Swelling     BILATERAL FEET AND LEGS - ONSET OF SWELLING APRIL 2014  . GERD (gastroesophageal reflux disease)   . Arthritis   . Fracture   . CVA (cerebral vascular accident) ([place])     STROKE 7 [place] TO REMOVE BENIGN MENINGIOMA - HAS LEFT SIDED WEAKNESS AND A LITTLE NUMBNESS  . Pain     "EXCRUCIATING PAIN" LEFT HIP - HAS A FRACTURE - IS CONFINED TO W/C AT HOME - NO WEIGHT BEARING LEFT HIP.  Marland Kitchen Parkinson's disease [facility])    Past Surgical History  Procedure Laterality Date  . Brain surgery      tumor removed  . Tonsillectomy    . Partial hysterectomy    . Glaucoma repair    . Cataract extraction    . Kidney surgery      left - HX ENLARGED LEFT KIDNEY ON XRAY - SURGERY WAS DONE ON URETER   . Colonoscopy    . Coronary angioplasty    . Hip arthroplasty Left 12/29/2012    Procedure: LEFT HIP  HEMI ARTHROPLASTY ;  Surgeon: Mauri Pole, MD;  Location: [facility];  Service: Orthopedics;  Laterality: Left;    Medications: Prior to Admission medications   Medication Sig Start Date End Date Taking? Authorizing Provider  atenolol (TENORMIN) 25 MG tablet TAKE (1) TABLET TWICE A DAY. 03/21/15   Kathyrn Drown, MD  atorvastatin (LIPITOR) 10 MG tablet TAKE 1 TABLET AT BEDTIME. 06/23/15   Kathyrn Drown, MD  calcium-vitamin D (OSCAL WITH D) 500-200 MG-UNIT per tablet Take 1 tablet by mouth 3 (three) times daily.     Historical Provider, MD  carbidopa-levodopa (SINEMET IR) 25-100 MG tablet TAKE (2) TABLETS THREE TIMES DAILY. 07/18/15   Marcial Pacas, MD  cefPROZIL (CEFZIL) 500 MG tablet Take 1 tablet (500 mg total) by mouth 2 (two) times daily. 07/27/15   Kathyrn Drown, MD  cholecalciferol (VITAMIN D) 400 UNITS TABS tablet Take 400 Units by mouth daily.    Historical Provider, MD  dipyridamole-aspirin (AGGRENOX) 200-25 MG 12hr capsule TAKE 1 CAPSULE TWICE A DAY 05/18/15  Kathyrn Drown, MD  DULoxetine (CYMBALTA) 30 MG capsule TAKE (1) CAPSULE DAILY. 07/22/15   Mikey Kirschner, MD  gabapentin (NEURONTIN) 100 MG capsule TAKE 2 CAPSULE IN THE MORNING AND 1 CAPSULE IN THE AFTERNNON 06/23/15   Kathyrn Drown, MD  gabapentin (NEURONTIN) 400 MG capsule TAKE 1 CAPSULE TWICE A DAY 05/04/15   Kathyrn Drown, MD  hydroxypropyl methylcellulose / hypromellose (ISOPTO TEARS / GONIOVISC) 2.5 % ophthalmic solution Place 1 drop into both eyes as needed for dry eyes. Reported on 06/20/2015    Historical Provider, MD  Levetiracetam (KEPPRA XR) 750 MG TB24 Take 1 tablet (750 mg total) by mouth at bedtime. 03/14/15   Marcial Pacas, MD  polyethylene glycol powder (GLYCOLAX/MIRALAX) powder Take 17 g by mouth 2 (two) times daily. Patient taking differently: Take 17 g by mouth daily as needed for mild constipation.  01/01/13   Danae Orleans, PA-C  potassium chloride (K-DUR) 10 MEQ tablet TAKE (1) TABLET TWICE A DAY. 06/23/15   Kathyrn Drown, MD  ranitidine (ZANTAC) 300 MG tablet TAKE 1 TABLET DAILY AS DIRECTED 06/23/15   Kathyrn Drown, MD  torsemide (DEMADEX) 20 MG tablet TAKE 1/2 TABLET EACH MORNING. 04/04/15   Kathyrn Drown, MD  traMADol (ULTRAM) 50 MG tablet Take 1 tablet (50 mg total) by mouth every 6 (six) hours as needed for moderate pain. 04/21/15   Kathyrn Drown, MD  zoledronic acid (RECLAST) 5 MG/100ML SOLN injection Inject 5 mg into the vein once. Takes yearly in December    Historical Provider, MD    Allergies:   Allergies  Allergen Reactions  . Lactose Intolerance (Gi)   . Penicillins Rash    Has patient had a PCN reaction causing immediate rash, facial/tongue/throat swelling, SOB or lightheadedness with hypotension: NO Has patient had a PCN reaction causing severe rash involving mucus membranes or skin necrosis: NO Has patient had a PCN reaction that required hospitalization: NO Has patient had a PCN reaction occurring within the last 10 years: NO If all of the above answers are "NO", then may proceed with Cephalosporin use. Pt can tolerate cephalosporins    Social History:  reports that she has never smoked. She has never used smokeless tobacco. She reports that she does not drink alcohol or use illicit drugs.  Family History: Family History  Problem Relation Age of Onset  . Liver disease Father   . Diabetes Father   . Coronary artery disease Mother     deseased  . Heart attack Mother   . Hyperlipidemia Mother   . Breast cancer Sister     x 2  . Cancer Sister     breast    Physical Exam: Filed Vitals:   08/13/15 2303 08/14/15 0000 08/14/15 0031 08/14/15 0100  BP: 165/64 148/68 148/73 152/66  Pulse: 147 81 77 80  Temp:      TempSrc:      Resp: 24 15 14 20   Height:      Weight:      SpO2: 100% 100% 99% 97%   General appearance: alert, cooperative and no distress Head: Normocephalic, without obvious abnormality, atraumatic Eyes: negative Nose: Nares normal. Septum midline. Mucosa  normal. No drainage or sinus tenderness. Neck: no JVD and supple, symmetrical, trachea midline Lungs: clear to auscultation bilaterally Heart: regular rate and rhythm, S1, S2 normal, no murmur, click, rub or gallop reproducible chest pain to left cw with palpation Abdomen: soft, non-tender; bowel sounds normal; no masses,  no organomegaly Extremities: extremities normal, atraumatic, no cyanosis or edema Pulses: 2+ and symmetric Skin: Skin color, texture, turgor normal. No rashes or lesions Neurologic: Grossly normal    Labs on Admission:   Recent Labs  08/14/15 0005  NA 139  K 3.5  CL 106  CO2 25  GLUCOSE 97  BUN 31*  CREATININE 0.92  CALCIUM 8.8*    Recent Labs  08/14/15 0005  WBC 4.9  NEUTROABS 2.4  HGB 10.5*  HCT 32.1*  MCV 91.2  PLT 199    Recent Labs  08/14/15 0005  TROPONINI <0.03    Radiological Exams on Admission: Dg Chest 2 View  08/14/2015  CLINICAL DATA:  Shortness of breath after walk. Left-sided chest pain. EXAM: CHEST  2 VIEW COMPARISON:  03/02/2015 FINDINGS: Severe emphysematous changes in the lungs. Central interstitial pattern suggesting chronic bronchitis. No focal airspace disease or consolidation in the lungs. No blunting of costophrenic angles. No pneumothorax. Scarring in the lung apices. Calcified and tortuous aorta. Degenerative changes in the spine. Anterior compression of T7, as shown on previous study. IMPRESSION: Emphysematous changes in the lungs. Chronic bronchitic changes. No evidence of active pulmonary disease. Electronically Signed   By: Lucienne Capers M.D.   On: 08/14/2015 00:30   ekg reviewed nsr no acute issues cxr reviewed no edema or infiltrate  Assessment/Plan  81 yo female with atypical chest pain with multiple risk factors  Principal Problem:   Chest pain- romi.  Doubt cardiac.  Echo in am.  Further work up as outpatient with PCP.  Aspirin.  Active Problems: stable problems.   Hyperlipidemia   Parkinsonism (HCC)    Seizures (HCC)   CAD (coronary artery disease)  h/o CVA   obs on tele.  Clarify home medications pending. DAVID,RACHAL A 08/14/2015, 1:18 AM

## 2015-08-14 NOTE — Discharge Summary (Signed)
Physician Discharge Summary  Samantha Hudson [id number] DOB: [birth year] DOA: 08/13/2015  PCP: Sallee Lange, MD  Admit date: 08/13/2015 Discharge date: 08/14/2015  Time spent: 60 minutes  Recommendations for Outpatient Follow-up:  1. Follow-up with Sallee Lange, MD in 1-2 weeks. All follow-up patient's chest pain when he to be reassessed. Patient will likely benefit from a basic metabolic profile to follow-up on electrolytes and renal function.   Discharge Diagnoses:  Principal Problem:   Chest pain Active Problems:   Hyperlipidemia   Parkinsonism (HCC)   Seizures (HCC)   CAD (coronary artery disease)   Discharge Condition: Stable and improved  Diet recommendation: Heart healthy  Filed Weights   08/13/15 2248 08/14/15 0500  Weight: 47.628 kg (105 lb) 48.807 kg (107 lb 9.6 oz)    History of present illness:  Per Dr Shanon Brow 81 yo female h/o CVA, CAD comes in with some sob and left sided chest pain tonight that was brief and resolved with no intervention. Pt reported since she had her stroke she has had pain to the left side of her body sometimes. The pain resolved soon after arriving to the ED. She denied any fevers. No cough. No sob right now. No swelling in her legs. She walks with a walker at home and lives with her husband. Pt referred for admission for her chest pain.  Hospital Course:  #1 atypical chest pain Patient presenting with left-sided chest pain which was brief and resolved with no intervention. Patient was admitted due to history of CVA and coronary artery disease cardiac enzymes were cycled which were negative 3. Chest x-ray was negative for any acute infiltrate. 2-D echo was obtained with a EF of 60-65% with no wall motion abnormalities. Patient did not have any further chest pain throughout the hospitalization. Patient's chest pain was reproducible and felt to likely be musculoskeletal in nature. Patient will be placed on scheduled ibuprofen 400 mg 3 times  daily for the next 4 days and then as needed. Patient be discharged home in stable and improved condition is to follow-up with PCP as outpatient.  #2 hyperlipidemia Patient was started back on a statin.  #3 parkinsonian symptoms Patient was placed back on home regimen of Sinemet. 5   #4 seizures Stable. No seizures noted during the hospitalization. Patient was maintained on Keppra.  #5 coronary artery disease Stable. See problem #1. Patient was maintained on aspirin, atenolol, Lipitor, Demadex. Outpatient follow-up.  Procedures:  2 d echo 08/14/2015  CXR 08/14/2015  Consultations:  None  Discharge Exam: Filed Vitals:   08/14/15 0500 08/14/15 1304  BP: 125/69 109/40  Pulse: 74 76  Temp: 98.4 F (36.9 C) 98.1 F (36.7 C)  Resp: 16 16    General: NAD Cardiovascular: RRR. Chest wall TTP. Respiratory: CTAB  Discharge Instructions   Discharge Instructions    Diet - low sodium heart healthy    Complete by:  As directed      Discharge instructions    Complete by:  As directed   Follow up with Sallee Lange, MD in 1-2 weeks.     Increase activity slowly    Complete by:  As directed           Current Discharge Medication List    START taking these medications   Details  ibuprofen (ADVIL,MOTRIN) 400 MG tablet Take 1 tablet (400 mg total) by mouth 3 (three) times daily. Take for 3 days scheduled, then every 6 hours as needed. Qty: 30 tablet, Refills: 0  CONTINUE these medications which have NOT CHANGED   Details  atenolol (TENORMIN) 25 MG tablet TAKE (1) TABLET TWICE A DAY. Qty: 60 tablet, Refills: 5    atorvastatin (LIPITOR) 10 MG tablet TAKE 1 TABLET AT BEDTIME. Qty: 30 tablet, Refills: 5    calcium-vitamin D (OSCAL WITH D) 500-200 MG-UNIT per tablet Take 1 tablet by mouth 3 (three) times daily.     carbidopa-levodopa (SINEMET IR) 25-100 MG tablet TAKE (2) TABLETS THREE TIMES DAILY. Qty: 180 tablet, Refills: 11    cholecalciferol (VITAMIN D) 400 UNITS  TABS tablet Take 400 Units by mouth daily.    dipyridamole-aspirin (AGGRENOX) 200-25 MG 12hr capsule TAKE 1 CAPSULE TWICE A DAY Qty: 60 capsule, Refills: 5    DULoxetine (CYMBALTA) 30 MG capsule TAKE (1) CAPSULE DAILY. Qty: 30 capsule, Refills: 0    !! gabapentin (NEURONTIN) 100 MG capsule TAKE 2 CAPSULE IN THE MORNING AND 1 CAPSULE IN THE AFTERNNON Qty: 90 capsule, Refills: 5    !! gabapentin (NEURONTIN) 400 MG capsule TAKE 1 CAPSULE TWICE A DAY Qty: 60 capsule, Refills: 5    hydroxypropyl methylcellulose / hypromellose (ISOPTO TEARS / GONIOVISC) 2.5 % ophthalmic solution Place 1 drop into both eyes as needed for dry eyes. Reported on 06/20/2015    levETIRAcetam (KEPPRA) 750 MG tablet Take 1 tablet by mouth at bedtime.    polyethylene glycol powder (GLYCOLAX/MIRALAX) powder Take 17 g by mouth 2 (two) times daily. Qty: 255 g, Refills: 0    potassium chloride (K-DUR) 10 MEQ tablet TAKE (1) TABLET TWICE A DAY. Qty: 60 tablet, Refills: 5    ranitidine (ZANTAC) 300 MG tablet TAKE 1 TABLET DAILY AS DIRECTED Qty: 30 tablet, Refills: 5    torsemide (DEMADEX) 20 MG tablet TAKE 1/2 TABLET EACH MORNING. Qty: 15 tablet, Refills: 0    traMADol (ULTRAM) 50 MG tablet Take 1 tablet (50 mg total) by mouth every 6 (six) hours as needed for moderate pain. Qty: 60 tablet, Refills: 3    zoledronic acid (RECLAST) 5 MG/100ML SOLN injection Inject 5 mg into the vein once. Takes yearly in December     !! - Potential duplicate medications found. Please discuss with provider.    STOP taking these medications     cefPROZIL (CEFZIL) 500 MG tablet      Levetiracetam (KEPPRA XR) 750 MG TB24        Allergies  Allergen Reactions  . Lactose Intolerance (Gi)   . Penicillins Rash    Has patient had a PCN reaction causing immediate rash, facial/tongue/throat swelling, SOB or lightheadedness with hypotension: NO Has patient had a PCN reaction causing severe rash involving mucus membranes or skin  necrosis: NO Has patient had a PCN reaction that required hospitalization: NO Has patient had a PCN reaction occurring within the last 10 years: NO If all of the above answers are "NO", then may proceed with Cephalosporin use. Pt can tolerate cephalosporins   Follow-up Information    Follow up with Sallee Lange, MD. Schedule an appointment as soon as possible for a visit in 1 week.   Specialty:  Family Medicine   Contact information:   [address] [place] [address] [phone]        The results of significant diagnostics from this hospitalization (including imaging, microbiology, ancillary and laboratory) are listed below for reference.    Significant Diagnostic Studies: Dg Chest 2 View  08/14/2015  CLINICAL DATA:  Shortness of breath after walk. Left-sided chest pain. EXAM: CHEST  2 VIEW COMPARISON:  03/02/2015 FINDINGS: Severe emphysematous changes in the lungs. Central interstitial pattern suggesting chronic bronchitis. No focal airspace disease or consolidation in the lungs. No blunting of costophrenic angles. No pneumothorax. Scarring in the lung apices. Calcified and tortuous aorta. Degenerative changes in the spine. Anterior compression of T7, as shown on previous study. IMPRESSION: Emphysematous changes in the lungs. Chronic bronchitic changes. No evidence of active pulmonary disease. Electronically Signed   By: Lucienne Capers M.D.   On: 08/14/2015 00:30    Microbiology: Recent Results (from the past 240 hour(s))  Urine culture     Status: None   Collection Time: 08/08/15 12:00 AM  Result Value Ref Range Status   Urine Culture, Routine Final report  Final   Urine Culture result 1 No growth  Final     Labs: Basic Metabolic Panel:  Recent Labs Lab 08/14/15 0005  NA 139  K 3.5  CL 106  CO2 25  GLUCOSE 97  BUN 31*  CREATININE 0.92  CALCIUM 8.8*   Liver Function Tests: No results for input(s): AST, ALT, ALKPHOS, BILITOT, PROT, ALBUMIN in the  last 168 hours. No results for input(s): LIPASE, AMYLASE in the last 168 hours. No results for input(s): AMMONIA in the last 168 hours. CBC:  Recent Labs Lab 08/14/15 0005  WBC 4.9  NEUTROABS 2.4  HGB 10.5*  HCT 32.1*  MCV 91.2  PLT 199   Cardiac Enzymes:  Recent Labs Lab 08/14/15 0005 08/14/15 0415  TROPONINI <0.03 <0.03   BNP: BNP (last 3 results)  Recent Labs  08/14/15 0005  BNP 94.0    ProBNP (last 3 results) No results for input(s): PROBNP in the last 8760 hours.  CBG: No results for input(s): GLUCAP in the last 168 hours.     SignedIrine Seal MD.  [facility] 08/14/2015, 1:53 PM

## 2015-08-14 NOTE — Care Management Obs Status (Signed)
[facility] NOTIFICATION   Patient Details  Name: HEYLIN DARGAN MRN: [id number] Date of Birth: Feb 05, 1935   Medicare Observation Status Notification Given:  Yes    Briant Sites, RN 08/14/2015, 10:06 AM

## 2015-08-14 NOTE — Progress Notes (Signed)
Patient discharged home with caregiver.  IV removed - WNL.  Reviewed DC instructions and medications.  Instructed to follow up with PCP.  No questions at this time.  Stable to DC, assisted off floor via [place] by staff.

## 2015-08-15 ENCOUNTER — Ambulatory Visit (HOSPITAL_COMMUNITY): Payer: Medicare Other | Admitting: Physical Therapy

## 2015-08-16 ENCOUNTER — Ambulatory Visit (HOSPITAL_COMMUNITY): Payer: Medicare Other | Admitting: Physical Therapy

## 2015-08-17 ENCOUNTER — Ambulatory Visit (INDEPENDENT_AMBULATORY_CARE_PROVIDER_SITE_OTHER): Payer: Medicare Other | Admitting: Neurology

## 2015-08-17 ENCOUNTER — Telehealth: Payer: Self-pay | Admitting: Neurology

## 2015-08-17 ENCOUNTER — Encounter: Payer: Self-pay | Admitting: Neurology

## 2015-08-17 ENCOUNTER — Ambulatory Visit (HOSPITAL_COMMUNITY): Payer: Medicare Other | Admitting: Physical Therapy

## 2015-08-17 VITALS — BP 102/62 | HR 64 | Ht 65.0 in | Wt 103.0 lb

## 2015-08-17 DIAGNOSIS — G20 Parkinson's disease: Secondary | ICD-10-CM | POA: Diagnosis not present

## 2015-08-17 DIAGNOSIS — R569 Unspecified convulsions: Secondary | ICD-10-CM

## 2015-08-17 DIAGNOSIS — I63511 Cerebral infarction due to unspecified occlusion or stenosis of right middle cerebral artery: Secondary | ICD-10-CM | POA: Diagnosis not present

## 2015-08-17 DIAGNOSIS — R269 Unspecified abnormalities of gait and mobility: Secondary | ICD-10-CM | POA: Diagnosis not present

## 2015-08-17 MED ORDER — ALPRAZOLAM 0.25 MG PO TABS: 0.2500 mg | ORAL_TABLET | Freq: Every evening | ORAL | Status: DC | PRN

## 2015-08-17 MED ORDER — LAMOTRIGINE ER 200 MG PO TB24: 200.0000 mg | ORAL_TABLET | Freq: Every day | ORAL | Status: DC

## 2015-08-17 NOTE — Progress Notes (Signed)
Chief Complaint  Patient presents with  . Parkinson's Disease    She is here with her husband, Konrad Dolores.  She uses a rolling walker for ambulation.  She is going to PT twice weekly. She has had two episodes of shortness of breath since last seen (went to hospital last weekend for evaluation).  No concerns with memory.   . Seizures    She tried Lamictal once and it made her feel bad.  She refused to take any further doses.  She continues to take Keppra XR but her husband says it causes her to be easily agitated.      PATIENT: Samantha Hudson DOB: 08-Oct-1934  HISTORICAL  LYLA SKORA is a 81 years old right-handed Caucasian female, accompanied by her husband, a retired Software engineer, referred by her primary care physician Dr. Sallee Lange for evaluation of worsening gait difficulty, difficulty initiate gait  She had a history of right meningioma, status post resection in 2007, 2 days later, she suffered a large right MCA stroke, also with past medical history of coronary artery disease, status post stent placement, she recovered very well, was able to drive, ambulate without assistance, but with residual left hemiparesthesia, often unpleasant deep achy pain involving left side of her body,  She had osteoporosis, left hip fracture in 2014, require replacement in August 2014, since surgery, she had a great decline of her ambulatory ability, she now rely on her walker  In 2014, she also developed  loss sense of smell, REM sleep disorder, screaming out of her dreams, mild constipation, now she noticed right hand tremor, small handwriting since 2015, mild memory trouble, worsening gait difficulty, difficulty initiate walking using her right leg, tendency to lean backwards, worsening left-sided pain, especially around her left hip, left anterior thigh.  She is taking gabapentin, which has been helpful, but 400 mg 4 times a day, will make her sleepy, she is only taking 900 mg daily now, continue have  excessive fatigue, daytime sleepiness, difficult to read, double vision.  We have reviewed MRI of the brain August 04 2014, demonstrate large size right MCA stroke, involving right medial, and lateral temporal lobe, no acute lesions.  April 20 first 2016 Since her initial visit August 12 2014, because of mild parkinsonian features, I have started Sinemet 25/100 one tablet 3 times a day, she is taking it at 10, 3, and 9 PM, no significant improvement, no significant side effect, she complains mild low back pain, continue significant gait difficulty, difficulty picking up her right leg  UPDATE November 11 2014: She can move better after Sinemet dosage was increased to 25/100 mg 4 times a day, physical therapy was helpful, husband reported that she takes Sinemet 1 hour before any meal, become center of her daily activity,  She denies significant memory trouble, complains of chronic neck pain, bilateral shoulder pain, left hip pain, low back pain,  We have reviewed her MRI lumbar in May 2016, There is lumbosacral transitional anatomy. This report assumes that there are 5 lumbar type vertebral bodies. Recommend close correlation with radiographs if intervention is elected. Moderate multilevel lumbar spondylosis. Degenerative disease is most pronounced at L3-L4 potentially affecting both exiting L3 nerves. Healed sacral insufficiency fractures demonstrated on prior nuclear medicine bone scan.  UPDATE Mar 14 2015: Around Sep 2016, she woke up one night she has bite her left lateral tongue, maybe with urinary incontinence, she is at risk for seizure, we have personally reviewed MRI of the brain with and without  contrast in March 2016, large right MCA encephalomalacia involving right temporal parietal frontal lobe.   She is taking Sinemet 25/100 ii tid, at 7, 12, 5 PM, which does help her parkinsonian features, she has occasionally upset stomach, husband is concerned about potential interaction of Azilect with  tramadol, which she is taking as needed for low back pain   UPDATE Jun 14 2015: She has mild difficult with swallowing, only limited to take medication tablet, has no difficulty swallowing her food,  Since Keppra XL our 750 mg was started in October 2016, she had one more episodes of woke up has bite her tongue in December 2016, husband also noticed she has increased agitations, continue has mild unsteady gait, left-sided low back pain, hip pain  UPDATE March 29th 2017: She only tried lamotrigine ER 50 mg every night in January, decided not to take it, worried about the side effects, she now taking Sinemet 25/100 mg 2 tablets 3 times a day, tolerating it well, ambulate much better, she can walk 1 mile now  She has no recurrent seizure, she did presented to emergency room in August 13 2015 for complaints of shortness of breath, chest pain, troponin was negative, Echocardiogram showed ejection fraction 60%, laboratory showed normal BMP, mild anemia, hemoglobin of 10 point 5.   Sinemet and physical therapy has helped her walking  ROS: 14 system review was performed, positive for: Bladder incontinence, constipation, agitation, confusion   ALLERGIES: Allergies  Allergen Reactions  . Lactose Intolerance (Gi)   . Penicillins Rash    Has patient had a PCN reaction causing immediate rash, facial/tongue/throat swelling, SOB or lightheadedness with hypotension: NO Has patient had a PCN reaction causing severe rash involving mucus membranes or skin necrosis: NO Has patient had a PCN reaction that required hospitalization: NO Has patient had a PCN reaction occurring within the last 10 years: NO If all of the above answers are "NO", then may proceed with Cephalosporin use. Pt can tolerate cephalosporins    HOME MEDICATIONS: Current Outpatient Prescriptions  Medication Sig Dispense Refill  . atenolol (TENORMIN) 25 MG tablet TAKE (1) TABLET TWICE A DAY. 60 tablet 1  . atorvastatin (LIPITOR) 10 MG  tablet TAKE 1 TABLET AT BEDTIME FOR CHOLESTEROL 30 tablet 2  . calcium-vitamin D (OSCAL WITH D) 500-200 MG-UNIT per tablet Take 1 tablet by mouth.    . cholecalciferol (VITAMIN D) 400 UNITS TABS tablet Take 400 Units by mouth daily.    Marland Kitchen dipyridamole-aspirin (AGGRENOX) 200-25 MG per 12 hr capsule TAKE  (1)  CAPSULE  TWICE DAILY. 60 capsule 12  . DULoxetine (CYMBALTA) 30 MG capsule TAKE (1) CAPSULE DAILY. 30 capsule 12  . feeding supplement (ENSURE COMPLETE) LIQD Take 237 mL by mouth daily.    Marland Kitchen gabapentin (NEURONTIN) 100 MG capsule Take one in the afternoon 30 capsule 12  . gabapentin (NEURONTIN) 400 MG capsule Take 1 capsule (400 mg total) by mouth 2 (two) times daily. 60 capsule 12  . polyethylene glycol powder (GLYCOLAX/MIRALAX) powder Take 17 g by mouth 2 (two) times daily. 255 g 0  . potassium chloride (K-DUR) 10 MEQ tablet TAKE (1) TABLET TWICE A DAY. 60 tablet 12  . ranitidine (ZANTAC) 300 MG tablet TAKE (1) TABLET DAILY AS DIRECTED. 30 tablet 12  . torsemide (DEMADEX) 20 MG tablet Take one half qam 15 tablet 11  . traMADol (ULTRAM) 50 MG tablet Take 1 tablet (50 mg total) by mouth 3 (three) times daily as needed. 60 tablet 5  PAST MEDICAL HISTORY: Past Medical History  Diagnosis Date  . Hyperlipemia   . IBS (irritable bowel syndrome)   . Osteoporosis   . Fibromyalgia   . Glaucoma     HAD LASER SURGERY - NO EYE DROPS REQUIRED  . Globus hystericus   . Fibromyalgia   . Meningioma ([place])   . CAD (coronary artery disease)     HEART STENT PLACED ABOUT 2000- NO LONGER SEES CARDIOLOGIST; NO C/O OF CHEST PAINS OR SOB  . Heart murmur     MVP SINCE THE 60'S - TAKES ATENOLOL -  . Anxiety   . Swelling     BILATERAL FEET AND LEGS - ONSET OF SWELLING APRIL 2014  . GERD (gastroesophageal reflux disease)   . Arthritis   . Fracture   . CVA (cerebral vascular accident) ([place])     STROKE 7 Nadine TO REMOVE BENIGN MENINGIOMA - HAS LEFT SIDED WEAKNESS AND A LITTLE  NUMBNESS  . Pain     "EXCRUCIATING PAIN" LEFT HIP - HAS A FRACTURE - IS CONFINED TO W/C AT HOME - NO WEIGHT BEARING LEFT HIP.  Marland Kitchen Parkinson's disease ([place])     PAST SURGICAL HISTORY: Past Surgical History  Procedure Laterality Date  . Brain surgery      tumor removed  . Tonsillectomy    . Partial hysterectomy    . Glaucoma repair    . Cataract extraction    . Kidney surgery      left - HX ENLARGED LEFT KIDNEY ON XRAY - SURGERY WAS DONE ON URETER   . Colonoscopy    . Coronary angioplasty    . Hip arthroplasty Left 12/29/2012    Procedure: LEFT HIP HEMI ARTHROPLASTY ;  Surgeon: Mauri Pole, MD;  Location: [facility];  Service: Orthopedics;  Laterality: Left;    FAMILY HISTORY: Family History  Problem Relation Age of Onset  . Liver disease Father   . Diabetes Father   . Coronary artery disease Mother     deseased  . Heart attack Mother   . Hyperlipidemia Mother   . Breast cancer Sister     x 2  . Cancer Sister     breast    SOCIAL HISTORY:  Social History   Social History  . Marital Status: Married    Spouse Name: N/A  . Number of Children: 2  . Years of Education: 16   Occupational History  . retired Pharmacist, hospital    Social History Main Topics  . Smoking status: Never Smoker   . Smokeless tobacco: Never Used  . Alcohol Use: No  . Drug Use: No  . Sexual Activity: Not on file   Other Topics Concern  . Not on file   Social History Narrative   Lives at home with husband.   Right- handed   Occasional caffeine use.     PHYSICAL EXAM   Filed Vitals:   08/17/15 1337  BP: 102/62  Pulse: 64  Height: 5\' 5"  (1.651 m)  Weight: 103 lb (46.72 kg)    Not recorded      Body mass index is 17.14 kg/(m^2).  PHYSICAL EXAMNIATION:  Gen: NAD, conversant, well nourised, obese, well groomed                     Cardiovascular: Regular rate rhythm, no peripheral edema, warm, nontender. Eyes: Conjunctivae clear without exudates or hemorrhage Neck: Supple, no carotid  bruise. Pulmonary: Clear to auscultation bilaterally  NEUROLOGICAL EXAM:  MENTAL STATUS: Speech:    Speech is normal; fluent and spontaneous with normal comprehension.  Cognition:Mini-Mental Status Examination is 30 out of 30, animal naming is 26     Orientation to time, place and person     Normal recent and remote memory     Normal Attention span and concentration     Normal Language, naming, repeating,spontaneous speech     Fund of knowledge   CRANIAL NERVES:  CN II: Visual fields are full to confrontation.Pupils are 4 mm and briskly reactive to light.  CN III, IV, VI: extraocular movement are normal. No ptosis. CN V: Facial sensation is intact to pinprick in all 3 divisions bilaterally. Corneal responses are intact.  CN VII: Face is symmetric with normal eye closure and smile. CN VIII: Hearing is normal to rubbing fingers CN IX, X: Palate elevates symmetrically. Phonation is normal. CN XI: Head turning and shoulder shrug are intact CN XII: Tongue is midline with normal movements and no atrophy.  MOTOR: Left arm pronation drift, mild spasticity,  mild fixation on rapid rotating movement, mild left upper extremity proximal and distal muscle weakness,  mild bradykinesia,   REFLEXES: Reflexes are 2+ and symmetric at the biceps, triceps, knees, and ankles. Plantar responses are flexor.  SENSORY: Decreased light touch, pinprick and vibratory sensation at left side of her body,    COORDINATION: Rapid alternating movements and fine finger movements are intact. There is no dysmetria on finger-to-nose and heel-knee-shin.  GAIT/STANCE: Push up from the chair arm, dragging left leg some, mild decrease stride, decreased left arm swing  DIAGNOSTIC DATA (LABS, IMAGING, TESTING) - I reviewed patient records, labs, notes, testing and imaging myself where available.  Lab Results  Component Value Date   WBC 4.9 08/14/2015   HGB 10.5* 08/14/2015   HCT 32.1* 08/14/2015   MCV 91.2  08/14/2015   PLT 199 08/14/2015      Component Value Date/Time   NA 139 08/14/2015 0005   NA 144 06/20/2015 1534   K 3.5 08/14/2015 0005   CL 106 08/14/2015 0005   CO2 25 08/14/2015 0005   GLUCOSE 97 08/14/2015 0005   GLUCOSE 150* 06/20/2015 1534   BUN 31* 08/14/2015 0005   BUN 25 06/20/2015 1534   CREATININE 0.92 08/14/2015 0005   CREATININE 0.82 04/02/2014 1301   CALCIUM 8.8* 08/14/2015 0005   PROT 7.6 05/02/2015 1315   PROT 6.8 02/18/2015 1116   ALBUMIN 4.4 05/02/2015 1315   ALBUMIN 4.1 02/18/2015 1116   AST 25 05/02/2015 1315   ALT 5* 05/02/2015 1315   ALKPHOS 49 05/02/2015 1315   BILITOT 0.6 05/02/2015 1315   BILITOT 0.5 02/18/2015 1116   GFRNONAA 58* 08/14/2015 0005   GFRAA >60 08/14/2015 0005   Lab Results  Component Value Date   CHOL 155 02/18/2015   HDL 70 02/18/2015   LDLCALC 74 02/18/2015   TRIG 54 02/18/2015   CHOLHDL 2.2 02/18/2015   No results found for: HGBA1C Lab Results  Component Value Date   VITAMINB12 1186* 01/01/2014   Lab Results  Component Value Date   TSH 3.316 12/29/2013    ASSESSMENT AND PLAN  LEITH MAXIE is a 81 y.o. female with past medical history of right craniotomy for right-sided meningioma, develop post surgical right MCA stroke, since 2014, she had worsening gait difficulty, some parkinsonian features, moderate right ankle dorsiflexion weakness, dragging her right leg  Parkinson's disease  Keep Sinemet to 25/100 mg 2 tablet 3 times a  day,  Seizure  But she has increased agitation with Keppra XR 750 mg every day, had a recurrent seizure in December 2016  We will switch her to lamotrigine xr, titrating to 200 mg every night, hope this helps her agitations  Right MCA stroke,   continue to address her vascular risk factor, continue Aggrenox twice a day      Marcial Pacas, M.D. Ph.D.  [facility] [address], [place], [place] [address] Ph: [phone] Fax: [phone]

## 2015-08-17 NOTE — Addendum Note (Signed)
Addended by: Marcial Pacas on: 08/17/2015 05:22 PM   Modules accepted: Orders

## 2015-08-17 NOTE — Telephone Encounter (Signed)
Samantha Hudson called to advise, when patient was here this afternoon to see Dr. Krista Blue, she was going to prescribe Xanax and LamoTRIgine XR 200 MG TB24. Lamotrigine is at the pharmacy, however the Xanax is not. Please call to advise.

## 2015-08-17 NOTE — Telephone Encounter (Signed)
Rx faxed to pharmacy - Mr. Fasching aware.

## 2015-08-18 ENCOUNTER — Ambulatory Visit (HOSPITAL_COMMUNITY): Payer: Medicare Other | Admitting: Physical Therapy

## 2015-08-20 ENCOUNTER — Other Ambulatory Visit: Payer: Self-pay | Admitting: Family Medicine

## 2015-08-22 ENCOUNTER — Ambulatory Visit (HOSPITAL_COMMUNITY): Payer: Medicare Other | Attending: Neurology | Admitting: Physical Therapy

## 2015-08-22 DIAGNOSIS — R293 Abnormal posture: Secondary | ICD-10-CM | POA: Insufficient documentation

## 2015-08-22 DIAGNOSIS — R2689 Other abnormalities of gait and mobility: Secondary | ICD-10-CM | POA: Diagnosis present

## 2015-08-22 DIAGNOSIS — R531 Weakness: Secondary | ICD-10-CM | POA: Diagnosis present

## 2015-08-22 DIAGNOSIS — M6281 Muscle weakness (generalized): Secondary | ICD-10-CM | POA: Insufficient documentation

## 2015-08-22 DIAGNOSIS — R279 Unspecified lack of coordination: Secondary | ICD-10-CM | POA: Insufficient documentation

## 2015-08-22 DIAGNOSIS — Z7409 Other reduced mobility: Secondary | ICD-10-CM | POA: Diagnosis present

## 2015-08-22 DIAGNOSIS — Z9181 History of falling: Secondary | ICD-10-CM | POA: Insufficient documentation

## 2015-08-22 DIAGNOSIS — G20 Parkinson's disease: Secondary | ICD-10-CM | POA: Insufficient documentation

## 2015-08-22 DIAGNOSIS — R269 Unspecified abnormalities of gait and mobility: Secondary | ICD-10-CM | POA: Diagnosis present

## 2015-08-22 NOTE — Therapy (Signed)
[facility] [address] [place], [place], [address] Phone: [phone]   Fax:  [phone]  Physical Therapy Treatment  Patient Details  Name: Samantha Hudson MRN: [id number] Date of Birth: [birth year] Referring Provider: Marcial Pacas   Encounter Date: 08/22/2015      PT End of Session - 08/22/15 1552    Visit Number 6   Number of Visits 16   Date for PT Re-Evaluation 09/08/15   Authorization Type Medicare and BCBS (G-code done 5th session)   Authorization Time Period 07/13/15 to 09/07/15   Authorization - Visit Number 6   Authorization - Number of Visits 15   PT Start Time [id number]   PT Stop Time 1432   PT Time Calculation (min) 43 min   Equipment Utilized During Treatment Gait belt   Activity Tolerance Patient tolerated treatment well   Behavior During Therapy [facility] for tasks assessed/performed      Past Medical History  Diagnosis Date  . Hyperlipemia   . IBS (irritable bowel syndrome)   . Osteoporosis   . Fibromyalgia   . Glaucoma     HAD LASER SURGERY - NO EYE DROPS REQUIRED  . Globus hystericus   . Fibromyalgia   . Meningioma ([place])   . CAD (coronary artery disease)     HEART STENT PLACED ABOUT 2000- NO LONGER SEES CARDIOLOGIST; NO C/O OF CHEST PAINS OR SOB  . Heart murmur     MVP SINCE THE 60'S - TAKES ATENOLOL -  . Anxiety   . Swelling     BILATERAL FEET AND LEGS - ONSET OF SWELLING APRIL 2014  . GERD (gastroesophageal reflux disease)   . Arthritis   . Fracture   . CVA (cerebral vascular accident) ([place])     STROKE 7 [place] TO REMOVE BENIGN MENINGIOMA - HAS LEFT SIDED WEAKNESS AND A LITTLE NUMBNESS  . Pain     "EXCRUCIATING PAIN" LEFT HIP - HAS A FRACTURE - IS CONFINED TO W/C AT HOME - NO WEIGHT BEARING LEFT HIP.  Marland Kitchen Parkinson's disease [facility])     Past Surgical History  Procedure Laterality Date  . Brain surgery      tumor removed  . Tonsillectomy    . Partial hysterectomy    . Glaucoma  repair    . Cataract extraction    . Kidney surgery      left - HX ENLARGED LEFT KIDNEY ON XRAY - SURGERY WAS DONE ON URETER   . Colonoscopy    . Coronary angioplasty    . Hip arthroplasty Left 12/29/2012    Procedure: LEFT HIP HEMI ARTHROPLASTY ;  Surgeon: Mauri Pole, MD;  Location: [facility];  Service: Orthopedics;  Laterality: Left;    There were no vitals filed for this visit.  Visit Diagnosis:  Parkinson's disease ([place])  Generalized weakness  Abnormality of gait  Impaired functional mobility, balance, gait, and endurance  Risk for falls  Poor posture      Subjective Assessment - 08/22/15 1355    Subjective patient arrives after having had an incident in which she had chest pain and couldn't breathe; she was hospitalized and has since been discharged, is feeling better. She reports they are starting to wean her off of Keppra, also reports that she has been told one of her medicines can make people confused. She has had low energy recently, since all this happened.   Pertinent History CAD, hx of CVA, PD, osteoporosis,  L hip replacement, hx of possible seizure, fibromyalgia    Patient Stated Goals less pain in L hip and foot and back; improve balance    Currently in Pain? Yes   Pain Score 6    Pain Location Other (Comment)  L side in general, leg primarily    Pain Orientation Left   Pain Descriptors / Indicators Sharp   Pain Type Acute pain   Pain Radiating Towards none    Pain Onset More than a month ago   Pain Frequency Constant   Aggravating Factors  weather changes    Pain Relieving Factors sometimes walking, tramadol    Effect of Pain on Daily Activities none                          [facility] Adult PT Treatment/Exercise - 08/22/15 0001    Ambulation/Gait   Gait Comments 2x218ft with 2# hand weights for general strengthening atnd to promote increased UE swing            PWR ([facility]) - 08/22/15 1400    PWR! Up 1x6   PWR! Rock 1x6   PWR! Up  1x10   PWR! Rock 1x10   PWR! Twist 1x10   PWR! Step 1x10   PWR! Rock at [employer] at back wall with [employer]), cues for form and safety    PWR! Up 1x10 with use of kleenex to mimc juggling scarf              PT Education - 08/22/15 1552    Education provided No          PT Short Term Goals - 08/11/15 1334    PT SHORT TERM GOAL #1   Title Patient will be able to maintain correct posture at least 70% of the time during all functional situations and scenarios in order to improve overall mobility and reduce fall risk    Time 4   Period Weeks   Status On-going   PT SHORT TERM GOAL #2   Title Patient will be able to complete bed mobility with mod(I), correct sequencnig in order to enhance safety in mobility at home    Time 4   Period Weeks   Status Achieved   PT SHORT TERM GOAL #3   Title Patient will be able to ambulate at least 857ft with LRAD, no rest breaks and no unsteadiness, in order to demonstrate improved mobility with reduced fall risk    Time 4   Period Weeks   Status On-going   PT SHORT TERM GOAL #4   Title Patient will score at least 48 on BERG balance test in order to demonstrate reduced unsteadiness and fall risk    Baseline 3/23- 47   Time 4   Period Weeks   Status On-going   PT SHORT TERM GOAL #5   Title Patient will consistently and correctly perform appropriate HEP, to be updated PRN    Baseline 3/23- has been doing them every day    Time 4   Period Weeks   Status Achieved           PT Long Term Goals - 08/11/15 1335    PT LONG TERM GOAL #1   Title Patient to demonstrate strength 4/5 in all tested muscle groups in order to improve functional stability and mobility, reduce fall risk    Time 8   Period Weeks   Status On-going   PT LONG TERM GOAL #  2   Title Patient to report no falls, LOB, or close calls with falls within the past 4 weeks in order to demonstrate improved functional and improved safety at home and in community    Time  8   Period Weeks   Status Achieved   PT LONG TERM GOAL #3   Title Patient to be able to ambulate at least 123ft during 6MWT with LRAD, no rest breaks and no unsteadiness, in order to demosntrate improved general function and readiness to perform community based tasks    Time 8   Period Weeks   Status On-going   PT LONG TERM GOAL #4   Title Patient to score at least 52 on BERG balance test in order to demonstrate minimal fall risk during mobility    Time 8   Period Weeks   Status On-going   PT LONG TERM GOAL #5   Title Patient to be performing at least 30 minutes of exercise, at least 5 days per week, in order to maintain functional gains and to promote improved health habits    Time 8   Period Weeks   Status On-going               Plan - 08/22/15 1552    Clinical Impression Statement Patient returns to PT after being hospitalized for what she describes "over doing it one weekend"; EPIC notes show patient went to ED for chest pain/SOB which was ulimtately diagnosed as musculoskeletal by MD. Patient did report that she is feeling rather low energy lately however. Continued PWR sequence today with reduced emphasis on weight bearing exercises due to moderate LE pain today, however did introduce standing PWR! rocks at [employer] and juggling kleenex during [employer]! up to stand in sitting for increasd cognitive aspects of therapy today. Patient reports mild fatigue at end of session but only RPE of 3/10 as a whole. Did note increased posterior LOB and stumbling today with some difficulty in recovering balance by patient. Min guard-Min(A) throughout session for safety during standing exercises and ambulation based tasks.    Pt will benefit from skilled therapeutic intervention in order to improve on the following deficits Abnormal gait;Decreased strength;Decreased balance;Decreased mobility;Difficulty walking;Decreased coordination;Postural dysfunction;Impaired flexibility   Rehab Potential  Excellent   Clinical Impairments Affecting Rehab Potential stress fracture L LE    PT Frequency 2x / week   PT Duration 4 weeks   PT Treatment/Interventions ADLs/Self Care Home Management;DME Instruction;Gait training;Stair training;Functional mobility training;Therapeutic activities;Therapeutic exercise;Balance training;Neuromuscular re-education;Patient/family education;Manual techniques;Energy conservation;Taping   PT Next Visit Plan  continue PWR! work with progression of flows in standing/isitting; continue to challenge balance and functional activity tolerance by progressing treadmill settings.    PT Home Exercise Plan review as needed.   Consulted and Agree with Plan of Care Patient        Problem List Patient Active Problem List   Diagnosis Date Noted  . CAD (coronary artery disease) 08/14/2015  . Chest pain 08/14/2015  . Constipation 08/08/2015  . Seizures ([place]) 06/14/2015  . Degenerative arthritis of thumb 03/03/2015  . Abnormality of gait 08/12/2014  . Parkinsonism ([place]) 08/12/2014  . Stroke ([place]) 08/12/2014  . Muscle weakness (generalized) 02/22/2014  . Stiffness of left knee 02/22/2014  . Hamstring tightness of left lower extremity 02/22/2014  . Pain in joint, pelvic region and thigh 02/22/2014  . Difficulty walking 04/07/2013  . Osteoporosis 03/10/2013  . Anemia 03/10/2013  . Mild malnutrition ([place]) 03/10/2013  . S/P left hip hemiarthroplasty  12/30/2012  . Osteoporosis with pathological fracture with delayed healing 11/25/2012  . Pedal edema 10/27/2012  . Fracture of sacrum ([place]) 10/02/2012  . Stricture and stenosis of esophagus 09/24/2011  . Hyperlipidemia 11/03/2008  . CAD, UNSPECIFIED SITE 11/03/2008    Deniece Ree PT, DPT [phone]  [facility] [address] [place], [place], [address] Phone: [phone]   Fax:  [phone]  Name: APOLLONIA RADCLIFF MRN: [id number] Date of Birth: 02-03-1935

## 2015-08-23 ENCOUNTER — Encounter: Payer: Self-pay | Admitting: Family Medicine

## 2015-08-23 ENCOUNTER — Ambulatory Visit (INDEPENDENT_AMBULATORY_CARE_PROVIDER_SITE_OTHER): Payer: Medicare Other | Admitting: Family Medicine

## 2015-08-23 VITALS — BP 100/60 | Ht 66.0 in | Wt 104.4 lb

## 2015-08-23 DIAGNOSIS — R0600 Dyspnea, unspecified: Secondary | ICD-10-CM

## 2015-08-23 DIAGNOSIS — R358 Other polyuria: Secondary | ICD-10-CM

## 2015-08-23 DIAGNOSIS — I63511 Cerebral infarction due to unspecified occlusion or stenosis of right middle cerebral artery: Secondary | ICD-10-CM | POA: Diagnosis not present

## 2015-08-23 DIAGNOSIS — R3589 Other polyuria: Secondary | ICD-10-CM

## 2015-08-23 DIAGNOSIS — R0789 Other chest pain: Secondary | ICD-10-CM

## 2015-08-23 LAB — POCT URINALYSIS DIPSTICK
SPEC GRAV UA: 1.025
pH, UA: 5

## 2015-08-23 NOTE — Progress Notes (Signed)
   Subjective:    Patient ID: Samantha Hudson, female    DOB: Sep 28, 1934, 81 y.o.   MRN: [id number]  HPI Patient is here today for a hospital follow up visit. Patient was admitted to [facility] on 08/13/15 for chest pain and shortness of breath. Patient states that she is doing better since leaving the hospital.  Patient states that she is tired and she has body aches also.     I reviewed over hospital records I reviewed over the ER visit I discussed all of this with the patient and her husband.  Review of Systems     Patient moderately cachectic week relates some shortness of breath with activity Objective:   Physical Exam   rectal exam was completed no sign of any type impaction lungs clear heart regular abdomen soft      Assessment & Plan:   I recommend cardiology referral I believe this patient should get nuclear imaging to rule out significant coronary artery disease patient does have risk factors for heart disease and has had a stroke in the past she is currently on proper medication to lessen the risk of stroke and heart disease.   I recommend pulmonary function testing to rule out possibility development of COPD   I find no evidence of hematuria currently on her urine and her rectal exam was negative I told the patient should she see any further blood to let us know patient was uncertain when she saw low bit of blood if it was from her bowel movement or from urination

## 2015-08-24 ENCOUNTER — Encounter: Payer: Self-pay | Admitting: Family Medicine

## 2015-08-24 ENCOUNTER — Ambulatory Visit (HOSPITAL_COMMUNITY): Payer: Medicare Other | Admitting: Physical Therapy

## 2015-08-24 DIAGNOSIS — R293 Abnormal posture: Secondary | ICD-10-CM

## 2015-08-24 DIAGNOSIS — Z7409 Other reduced mobility: Secondary | ICD-10-CM

## 2015-08-24 DIAGNOSIS — G20 Parkinson's disease: Secondary | ICD-10-CM | POA: Diagnosis not present

## 2015-08-24 DIAGNOSIS — Z9181 History of falling: Secondary | ICD-10-CM

## 2015-08-24 DIAGNOSIS — R531 Weakness: Secondary | ICD-10-CM

## 2015-08-24 DIAGNOSIS — R269 Unspecified abnormalities of gait and mobility: Secondary | ICD-10-CM

## 2015-08-24 NOTE — Addendum Note (Signed)
Addended by: Dairl Ponder on: 08/24/2015 08:16 AM   Modules accepted: Orders

## 2015-08-24 NOTE — Therapy (Signed)
[facility] [place], [place], [address] Phone: [phone]   Fax:  [phone]  Physical Therapy Treatment  Patient Details  Name: Samantha Hudson MRN: [id number] Date of Birth: 08/19/1934 Referring Provider: Marcial Pacas   Encounter Date: 08/24/2015      PT End of Session - 08/24/15 1747    Visit Number 7   Number of Visits 16   Date for PT Re-Evaluation 09/08/15   Authorization Type Medicare and BCBS (G-code done 5th session)   Authorization Time Period 07/13/15 to 09/07/15   Authorization - Visit Number 7   Authorization - Number of Visits 15   PT Start Time [id number]   PT Stop Time 1430   PT Time Calculation (min) 43 min   Equipment Utilized During Treatment Gait belt   Activity Tolerance Patient tolerated treatment well   Behavior During Therapy [facility] for tasks assessed/performed      Past Medical History  Diagnosis Date  . Hyperlipemia   . IBS (irritable bowel syndrome)   . Osteoporosis   . Fibromyalgia   . Glaucoma     HAD LASER SURGERY - NO EYE DROPS REQUIRED  . Globus hystericus   . Fibromyalgia   . Meningioma ([place])   . CAD (coronary artery disease)     HEART STENT PLACED ABOUT 2000- NO LONGER SEES CARDIOLOGIST; NO C/O OF CHEST PAINS OR SOB  . Heart murmur     MVP SINCE THE 60'S - TAKES ATENOLOL -  . Anxiety   . Swelling     BILATERAL FEET AND LEGS - ONSET OF SWELLING APRIL 2014  . GERD (gastroesophageal reflux disease)   . Arthritis   . Fracture   . CVA (cerebral vascular accident) ([place])     STROKE 7 [place] TO REMOVE BENIGN MENINGIOMA - HAS LEFT SIDED WEAKNESS AND A LITTLE NUMBNESS  . Pain     "EXCRUCIATING PAIN" LEFT HIP - HAS A FRACTURE - IS CONFINED TO W/C AT HOME - NO WEIGHT BEARING LEFT HIP.  Marland Kitchen Parkinson's disease [facility])     Past Surgical History  Procedure Laterality Date  . Brain surgery      tumor removed  . Tonsillectomy    . Partial hysterectomy    . Glaucoma  repair    . Cataract extraction    . Kidney surgery      left - HX ENLARGED LEFT KIDNEY ON XRAY - SURGERY WAS DONE ON URETER   . Colonoscopy    . Coronary angioplasty    . Hip arthroplasty Left 12/29/2012    Procedure: LEFT HIP HEMI ARTHROPLASTY ;  Surgeon: Mauri Pole, MD;  Location: [facility];  Service: Orthopedics;  Laterality: Left;    There were no vitals filed for this visit.  Visit Diagnosis:  Parkinson's disease ([place])  Generalized weakness  Abnormality of gait  Impaired functional mobility, balance, gait, and endurance  Risk for falls  Poor posture      Subjective Assessment - 08/24/15 1350    Subjective Patient reports that northing major has happened sicne last PT session, just not aching as bad as last session   Patient Stated Goals less pain in L hip and foot and back; improve balance    Currently in Pain? Yes   Pain Score 5    Pain Location Other (Comment)  L thigh and hip "it aches from time to time"   Pain Orientation Left  [facility] Adult PT Treatment/Exercise - 08/24/15 0001    Ambulation/Gait   Gait Comments 3x263ft with cues for reciprocal motion, opposite UE/LE motions, reduction of scissoring gait; multiple LOB noted laterally and posteriorly, Min(A) to correct    Lumbar Exercises: Aerobic   Tread Mill x5 mintues at 1.3 MPH, cues for heel-toe gait and foot clearance; supervised by PT    Knee/Hip Exercises: Standing   Gait Training backwards gait 2x65ft, Miin guard for balance and safety, cues for steps and to reduce scissoring            PWR [facility]) - 08/24/15 1745    PWR! Up 1x10, version with bilateral UE supports    PWR! Rock 1x10 with use of UE props to improve reach/extension    PWR! Twist 1x10 with use of props    PWR Step 1x10; version with opposite alternating reciprocal UE/LEs and reach with UE props    Comments patient had difficulty with standing twists with props, tends to cmpensate with lateral  flexion rather than true twist              PT Education - 08/24/15 1747    Education provided No          PT Short Term Goals - 08/11/15 1334    PT SHORT TERM GOAL #1   Title Patient will be able to maintain correct posture at least 70% of the time during all functional situations and scenarios in order to improve overall mobility and reduce fall risk    Time 4   Period Weeks   Status On-going   PT SHORT TERM GOAL #2   Title Patient will be able to complete bed mobility with mod(I), correct sequencnig in order to enhance safety in mobility at home    Time 4   Period Weeks   Status Achieved   PT SHORT TERM GOAL #3   Title Patient will be able to ambulate at least 88ft with LRAD, no rest breaks and no unsteadiness, in order to demonstrate improved mobility with reduced fall risk    Time 4   Period Weeks   Status On-going   PT SHORT TERM GOAL #4   Title Patient will score at least 48 on BERG balance test in order to demonstrate reduced unsteadiness and fall risk    Baseline 3/23- 47   Time 4   Period Weeks   Status On-going   PT SHORT TERM GOAL #5   Title Patient will consistently and correctly perform appropriate HEP, to be updated PRN    Baseline 3/23- has been doing them every day    Time 4   Period Weeks   Status Achieved           PT Long Term Goals - 08/11/15 1335    PT LONG TERM GOAL #1   Title Patient to demonstrate strength 4/5 in all tested muscle groups in order to improve functional stability and mobility, reduce fall risk    Time 8   Period Weeks   Status On-going   PT LONG TERM GOAL #2   Title Patient to report no falls, LOB, or close calls with falls within the past 4 weeks in order to demonstrate improved functional and improved safety at home and in community    Time 8   Period Weeks   Status Achieved   PT LONG TERM GOAL #3   Title Patient to be able to ambulate at least 1276ft during 6MWT with LRAD, no rest  breaks and no unsteadiness, in  order to demosntrate improved general function and readiness to perform community based tasks    Time 8   Period Weeks   Status On-going   PT LONG TERM GOAL #4   Title Patient to score at least 52 on BERG balance test in order to demonstrate minimal fall risk during mobility    Time 8   Period Weeks   Status On-going   PT LONG TERM GOAL #5   Title Patient to be performing at least 30 minutes of exercise, at least 5 days per week, in order to maintain functional gains and to promote improved health habits    Time 8   Period Weeks   Status On-going               Plan - 08/24/15 1748    Clinical Impression Statement Patient arrived today, reporting that she is feeling better but still experiencing some discomfort in her L LE however she reports this comes and goes usually. Began session with supervised treadmill walking, with cues for form, followed by over ground walking with no device, also with cues for form and Min(A) due to some unsteadiness laterally and posteriorly as well as scissoring. Performed variations of all standing power moves today with UE props. Patient had espeical difficulty with twists with props today, tending to laterally flex in thoracic spine rather than perofrm true trunk twist, however able to perform other versions with min-mod cues and facilitation today. Did note increased posterior LOB and some festinating, also noted one short period where patient seemed to freeze for a second or two when performing a turn in close corners. Patient reports difficulty opening soda and soup cans, manipulating buttons when dressing, and difficulty with buckes, and PT sent OT PWR! referral to MD today. Patient reported RPE 6/10 at end of session.    Pt will benefit from skilled therapeutic intervention in order to improve on the following deficits Abnormal gait;Decreased strength;Decreased balance;Decreased mobility;Difficulty walking;Decreased coordination;Postural  dysfunction;Impaired flexibility   Rehab Potential Excellent   Clinical Impairments Affecting Rehab Potential stress fracture L LE    PT Frequency 2x / week   PT Duration 4 weeks   PT Treatment/Interventions ADLs/Self Care Home Management;DME Instruction;Gait training;Stair training;Functional mobility training;Therapeutic activities;Therapeutic exercise;Balance training;Neuromuscular re-education;Patient/family education;Manual techniques;Energy conservation;Taping   PT Next Visit Plan  continue PWR! work with progression of flows in standing/isitting; continue to challenge balance and functional activity tolerance by progressing treadmill settings.    PT Home Exercise Plan review as needed.   Recommended Other Services PWR! OT    Consulted and Agree with Plan of Care Patient        Problem List Patient Active Problem List   Diagnosis Date Noted  . CAD (coronary artery disease) 08/14/2015  . Chest pain 08/14/2015  . Constipation 08/08/2015  . Seizures ([place]) 06/14/2015  . Degenerative arthritis of thumb 03/03/2015  . Abnormality of gait 08/12/2014  . Parkinsonism ([place]) 08/12/2014  . Stroke ([place]) 08/12/2014  . Muscle weakness (generalized) 02/22/2014  . Stiffness of left knee 02/22/2014  . Hamstring tightness of left lower extremity 02/22/2014  . Pain in joint, pelvic region and thigh 02/22/2014  . Difficulty walking 04/07/2013  . Osteoporosis 03/10/2013  . Anemia 03/10/2013  . Mild malnutrition ([place]) 03/10/2013  . S/P left hip hemiarthroplasty 12/30/2012  . Osteoporosis with pathological fracture with delayed healing 11/25/2012  . Pedal edema 10/27/2012  . Fracture of sacrum ([place]) 10/02/2012  . Stricture and stenosis  of esophagus 09/24/2011  . Hyperlipidemia 11/03/2008  . CAD, UNSPECIFIED SITE 11/03/2008    Deniece Ree PT, DPT [phone]  [facility] [address] [place], [place], [address] Phone: [phone]   Fax:   [phone]  Name: Samantha Hudson MRN: [id number] Date of Birth: [birth year]

## 2015-08-24 NOTE — Progress Notes (Signed)
Pre and post pulmonary function tests ordered in Epic. Respiratory at [facility] will call patient to schedule test

## 2015-08-29 ENCOUNTER — Ambulatory Visit (HOSPITAL_COMMUNITY): Payer: Medicare Other | Admitting: Physical Therapy

## 2015-08-29 DIAGNOSIS — M6281 Muscle weakness (generalized): Secondary | ICD-10-CM

## 2015-08-29 DIAGNOSIS — R279 Unspecified lack of coordination: Secondary | ICD-10-CM

## 2015-08-29 DIAGNOSIS — Z9181 History of falling: Secondary | ICD-10-CM

## 2015-08-29 DIAGNOSIS — R293 Abnormal posture: Secondary | ICD-10-CM

## 2015-08-29 DIAGNOSIS — R2689 Other abnormalities of gait and mobility: Secondary | ICD-10-CM

## 2015-08-29 DIAGNOSIS — G20 Parkinson's disease: Secondary | ICD-10-CM | POA: Diagnosis not present

## 2015-08-29 NOTE — Therapy (Signed)
[facility] [address] [place], [place], [address] Phone: [phone]   Fax:  [phone]  Physical Therapy Treatment (codes/recert done)  Patient Details  Name: Samantha Hudson MRN: [id number] Date of Birth: 1934/05/24 Referring Provider: Marcial Pacas   Encounter Date: 08/29/2015      PT End of Session - 08/29/15 1357    Visit Number 8   Number of Visits 16   Date for PT Re-Evaluation 09/08/15   Authorization Type Medicare and BCBS (G-code done 5th session)   Authorization Time Period 07/13/15 to 09/07/15   Authorization - Visit Number 8   Authorization - Number of Visits 15   PT Start Time [id number]  patient arrived late    PT Stop Time 1348   PT Time Calculation (min) 42 min   Equipment Utilized During Treatment Gait belt   Activity Tolerance Patient tolerated treatment well   Behavior During Therapy [facility] for tasks assessed/performed      Past Medical History  Diagnosis Date  . Hyperlipemia   . IBS (irritable bowel syndrome)   . Osteoporosis   . Fibromyalgia   . Glaucoma     HAD LASER SURGERY - NO EYE DROPS REQUIRED  . Globus hystericus   . Fibromyalgia   . Meningioma ([place])   . CAD (coronary artery disease)     HEART STENT PLACED ABOUT 2000- NO LONGER SEES CARDIOLOGIST; NO C/O OF CHEST PAINS OR SOB  . Heart murmur     MVP SINCE THE 60'S - TAKES ATENOLOL -  . Anxiety   . Swelling     BILATERAL FEET AND LEGS - ONSET OF SWELLING APRIL 2014  . GERD (gastroesophageal reflux disease)   . Arthritis   . Fracture   . CVA (cerebral vascular accident) ([place])     STROKE 7 [place] TO REMOVE BENIGN MENINGIOMA - HAS LEFT SIDED WEAKNESS AND A LITTLE NUMBNESS  . Pain     "EXCRUCIATING PAIN" LEFT HIP - HAS A FRACTURE - IS CONFINED TO W/C AT HOME - NO WEIGHT BEARING LEFT HIP.  Marland Kitchen Parkinson's disease [facility])     Past Surgical History  Procedure Laterality Date  . Brain surgery      tumor removed  . Tonsillectomy     . Partial hysterectomy    . Glaucoma repair    . Cataract extraction    . Kidney surgery      left - HX ENLARGED LEFT KIDNEY ON XRAY - SURGERY WAS DONE ON URETER   . Colonoscopy    . Coronary angioplasty    . Hip arthroplasty Left 12/29/2012    Procedure: LEFT HIP HEMI ARTHROPLASTY ;  Surgeon: Mauri Pole, MD;  Location: [facility];  Service: Orthopedics;  Laterality: Left;    There were no vitals filed for this visit.      Subjective Assessment - 08/29/15 1351    Subjective Patient reports she is doing well, no major changes or reports since last session   Pertinent History CAD, hx of CVA, PD, osteoporosis, L hip replacement, hx of possible seizure, fibromyalgia    Patient Stated Goals less pain in L hip and foot and back; improve balance    Currently in Pain? No/denies                         [facility] Adult PT Treatment/Exercise - 08/29/15 0001    Ambulation/Gait   Gait Comments  3x260ft forwards, cues for form; 1x121ft backwards, cues for space between feet and step length   Lumbar Exercises: Aerobic   Tread Mill x5 mintues at 1.3 MPH, cues for heel-toe gait and foot clearance; supervised by PT            PWR [facility]) - 08/29/15 1355  Done in standing    PWR! Up 3x3, use of cones and props for form    PWR! [employer], ues of cone and cues for form   PWR! Twist 1x8 with green weight ball, cues for form    PWR Step 1x10 with use of hurdles with PWR! up at end, cues for form    Comments diffuclty with sequencing with more complex and combination PWR exercises; cues for posture adn timing, celar definitive movemetns              PT Education - 08/29/15 1357    Education provided Yes   Education Details energy conservation for fall prevention    Person(s) Educated Patient   Methods Explanation   Comprehension Verbalized understanding          PT Short Term Goals - 08/11/15 1334    PT SHORT TERM GOAL #1   Title Patient will be able to maintain correct  posture at least 70% of the time during all functional situations and scenarios in order to improve overall mobility and reduce fall risk    Time 4   Period Weeks   Status On-going   PT SHORT TERM GOAL #2   Title Patient will be able to complete bed mobility with mod(I), correct sequencnig in order to enhance safety in mobility at home    Time 4   Period Weeks   Status Achieved   PT SHORT TERM GOAL #3   Title Patient will be able to ambulate at least 868ft with LRAD, no rest breaks and no unsteadiness, in order to demonstrate improved mobility with reduced fall risk    Time 4   Period Weeks   Status On-going   PT SHORT TERM GOAL #4   Title Patient will score at least 48 on BERG balance test in order to demonstrate reduced unsteadiness and fall risk    Baseline 3/23- 47   Time 4   Period Weeks   Status On-going   PT SHORT TERM GOAL #5   Title Patient will consistently and correctly perform appropriate HEP, to be updated PRN    Baseline 3/23- has been doing them every day    Time 4   Period Weeks   Status Achieved           PT Long Term Goals - 08/11/15 1335    PT LONG TERM GOAL #1   Title Patient to demonstrate strength 4/5 in all tested muscle groups in order to improve functional stability and mobility, reduce fall risk    Time 8   Period Weeks   Status On-going   PT LONG TERM GOAL #2   Title Patient to report no falls, LOB, or close calls with falls within the past 4 weeks in order to demonstrate improved functional and improved safety at home and in community    Time 8   Period Weeks   Status Achieved   PT LONG TERM GOAL #3   Title Patient to be able to ambulate at least 1256ft during 6MWT with LRAD, no rest breaks and no unsteadiness, in order to demosntrate improved general function and readiness to perform community based  tasks    Time 8   Period Weeks   Status On-going   PT LONG TERM GOAL #4   Title Patient to score at least 52 on BERG balance test in order to  demonstrate minimal fall risk during mobility    Time 8   Period Weeks   Status On-going   PT LONG TERM GOAL #5   Title Patient to be performing at least 30 minutes of exercise, at least 5 days per week, in order to maintain functional gains and to promote improved health habits    Time 8   Period Weeks   Status On-going               Plan - 08/29/15 1358    Clinical Impression Statement Patient arrived late today, apologetic and reporting that they got behind a tractor on the way here. Began session with gait training on treadmilll at patient selected pace and cues for gait form and posture, follwed immediately by overground walking program. Continued with standing PWR! exercises today, with an introduction of one combination exercise of standing PWR! up and PWR! rock, which patient had some moderate difficulty with lateral weight shifting despite cues and tactile assist from PT. Continues to require cues for form, posture, and clear definitive movements during standing activities. Finished session with backwards walking approxmiately 176ft with cues for form, distance between feet during backwards gait, and unsteadiness. Fatigue at end of session with RPE approximately 7-8/10  at end of session.    Rehab Potential Excellent   Clinical Impairments Affecting Rehab Potential stress fracture L LE    PT Frequency 2x / week   PT Duration 4 weeks   PT Treatment/Interventions ADLs/Self Care Home Management;DME Instruction;Gait training;Stair training;Functional mobility training;Therapeutic activities;Therapeutic exercise;Balance training;Neuromuscular re-education;Patient/family education;Manual techniques;Energy conservation;Taping   PT Next Visit Plan start session with treadmill gait training, followed immediately by over ground gait training; backwards gait training; standing balance, functional activity tolerance training, hip ABD work. Get RPE at end of session.    PT Home Exercise Plan  review as needed.   Recommended Other Services PWR! OT (asked front desk to fax on 08/29/15)   Consulted and Agree with Plan of Care Patient      Patient will benefit from skilled therapeutic intervention in order to improve the following deficits and impairments:  Abnormal gait, Decreased strength, Decreased balance, Decreased mobility, Difficulty walking, Decreased coordination, Postural dysfunction, Impaired flexibility  Visit Diagnosis: Muscle weakness (generalized) - Plan: PT plan of care cert/re-cert  Other abnormalities of gait and mobility - Plan: PT plan of care cert/re-cert  History of falling - Plan: PT plan of care cert/re-cert  Abnormal posture - Plan: PT plan of care cert/re-cert  Unspecified lack of coordination - Plan: PT plan of care cert/re-cert     Problem List Patient Active Problem List   Diagnosis Date Noted  . CAD (coronary artery disease) 08/14/2015  . Chest pain 08/14/2015  . Constipation 08/08/2015  . Seizures ([place]) 06/14/2015  . Degenerative arthritis of thumb 03/03/2015  . Abnormality of gait 08/12/2014  . Parkinsonism ([place]) 08/12/2014  . Stroke ([place]) 08/12/2014  . Muscle weakness (generalized) 02/22/2014  . Stiffness of left knee 02/22/2014  . Hamstring tightness of left lower extremity 02/22/2014  . Pain in joint, pelvic region and thigh 02/22/2014  . Difficulty walking 04/07/2013  . Osteoporosis 03/10/2013  . Anemia 03/10/2013  . Mild malnutrition ([place]) 03/10/2013  . S/P left hip hemiarthroplasty 12/30/2012  . Osteoporosis with pathological  fracture with delayed healing 11/25/2012  . Pedal edema 10/27/2012  . Fracture of sacrum ([place]) 10/02/2012  . Stricture and stenosis of esophagus 09/24/2011  . Hyperlipidemia 11/03/2008  . CAD, UNSPECIFIED SITE 11/03/2008   Deniece Ree PT, DPT [phone]  [facility] [address] [place], [place], [address] Phone: [phone]   Fax:   [phone]  Name: SAFIYAH BRAYMAN MRN: [id number] Date of Birth: [birth year]

## 2015-08-31 ENCOUNTER — Encounter (HOSPITAL_COMMUNITY): Payer: Self-pay

## 2015-08-31 ENCOUNTER — Ambulatory Visit (HOSPITAL_COMMUNITY): Payer: Medicare Other

## 2015-08-31 ENCOUNTER — Ambulatory Visit: Payer: Medicare Other | Admitting: Cardiovascular Disease

## 2015-08-31 DIAGNOSIS — R279 Unspecified lack of coordination: Secondary | ICD-10-CM

## 2015-08-31 DIAGNOSIS — R293 Abnormal posture: Secondary | ICD-10-CM

## 2015-08-31 DIAGNOSIS — R2689 Other abnormalities of gait and mobility: Secondary | ICD-10-CM

## 2015-08-31 DIAGNOSIS — M6281 Muscle weakness (generalized): Secondary | ICD-10-CM

## 2015-08-31 DIAGNOSIS — Z9181 History of falling: Secondary | ICD-10-CM

## 2015-08-31 DIAGNOSIS — G20 Parkinson's disease: Secondary | ICD-10-CM | POA: Diagnosis not present

## 2015-08-31 NOTE — Therapy (Signed)
[facility] [place], [place], [address] Phone: [phone]   Fax:  [phone]  Physical Therapy Treatment  Patient Details  Name: Samantha Hudson MRN: [id number] Date of Birth: 01-20-1935 Referring Provider: Marcial Pacas   Encounter Date: 08/31/2015      PT End of Session - 08/31/15 1311    Visit Number 9   Number of Visits 16   Date for PT Re-Evaluation 09/08/15   Authorization Type Medicare and BCBS (G-code done 5th session)   Authorization Time Period 07/13/15 to 09/07/15   Authorization - Visit Number 9   Authorization - Number of Visits 15   PT Start Time 1302   PT Stop Time 1344   PT Time Calculation (min) 42 min   Equipment Utilized During Treatment Gait belt   Activity Tolerance Patient tolerated treatment well   Behavior During Therapy [facility] for tasks assessed/performed      Past Medical History  Diagnosis Date  . Hyperlipemia   . IBS (irritable bowel syndrome)   . Osteoporosis   . Fibromyalgia   . Glaucoma     HAD LASER SURGERY - NO EYE DROPS REQUIRED  . Globus hystericus   . Fibromyalgia   . Meningioma ([place])   . CAD (coronary artery disease)     HEART STENT PLACED ABOUT 2000- NO LONGER SEES CARDIOLOGIST; NO C/O OF CHEST PAINS OR SOB  . Heart murmur     MVP SINCE THE 60'S - TAKES ATENOLOL -  . Anxiety   . Swelling     BILATERAL FEET AND LEGS - ONSET OF SWELLING APRIL 2014  . GERD (gastroesophageal reflux disease)   . Arthritis   . Fracture   . CVA (cerebral vascular accident) ([place])     STROKE 7 [place] TO REMOVE BENIGN MENINGIOMA - HAS LEFT SIDED WEAKNESS AND A LITTLE NUMBNESS  . Pain     "EXCRUCIATING PAIN" LEFT HIP - HAS A FRACTURE - IS CONFINED TO W/C AT HOME - NO WEIGHT BEARING LEFT HIP.  Marland Kitchen Parkinson's disease [facility])     Past Surgical History  Procedure Laterality Date  . Brain surgery      tumor removed  . Tonsillectomy    . Partial hysterectomy    . Glaucoma  repair    . Cataract extraction    . Kidney surgery      left - HX ENLARGED LEFT KIDNEY ON XRAY - SURGERY WAS DONE ON URETER   . Colonoscopy    . Coronary angioplasty    . Hip arthroplasty Left 12/29/2012    Procedure: LEFT HIP HEMI ARTHROPLASTY ;  Surgeon: Mauri Pole, MD;  Location: [facility];  Service: Orthopedics;  Laterality: Left;    There were no vitals filed for this visit.      Subjective Assessment - 08/31/15 1307    Subjective No major changes reported since last session. Patient reported minor c/o L hip and thigh pain upon arrival that was rated a 4/10 on a VAS.    Pertinent History CAD, hx of CVA, PD, osteoporosis, L hip replacement, hx of possible seizure, fibromyalgia    Patient Stated Goals less pain in L hip and foot and back; improve balance    Currently in Pain? Yes   Pain Score 4    Pain Location Hip  L thigh   Pain Orientation Left   Pain Descriptors / Indicators Aching   Pain Type Acute pain  Pain Onset More than a month ago   Pain Frequency Intermittent   Aggravating Factors  change of weather    Pain Relieving Factors tramadol and sitting            [facility] Adult PT Treatment/Exercise - 08/31/15 0001    Ambulation/Gait   Gait Comments 3x268ft forwards, cues for form; 2 x 80 ft backwards, cues for space between feet and step length   Lumbar Exercises: Aerobic   Tread Mill x6 mintues at 1.2 MPH, cues for heel-toe gait and foot clearance; supervised by PT           Balance Exercises - 08/31/15 1324    Balance Exercises: Standing   Standing Eyes Opened Narrow base of support (BOS);Foam/compliant surface;Other reps (comment)  with functional reach in clock formation, bil, x 3 sets   Tandem Stance Eyes open;Upper extremity support 1;Other (comment);Intermittent upper extremity support  A<>P weight shifting x 3 sets, bilaterally, 40 sec each set   Tandem Gait Forward;Retro;Intermittent upper extremity support;5 reps   Sidestepping Upper extremity  support;Other (comment)  4 sets of 8 steps, bilaterally, with red TB at mid thigh   Other Standing Exercises Forward steps with B UE elevation, alternating, with patient saying "BIG" loudly x 2 sets of 15 reps  with CGA x 1            PT Education - 08/31/15 1311    Education provided Yes   Education Details 5/5 fall precautions    Person(s) Educated Patient   Methods Explanation   Comprehension Verbalized understanding          PT Short Term Goals - 08/11/15 1334    PT SHORT TERM GOAL #1   Title Patient will be able to maintain correct posture at least 70% of the time during all functional situations and scenarios in order to improve overall mobility and reduce fall risk    Time 4   Period Weeks   Status On-going   PT SHORT TERM GOAL #2   Title Patient will be able to complete bed mobility with mod(I), correct sequencnig in order to enhance safety in mobility at home    Time 4   Period Weeks   Status Achieved   PT SHORT TERM GOAL #3   Title Patient will be able to ambulate at least 853ft with LRAD, no rest breaks and no unsteadiness, in order to demonstrate improved mobility with reduced fall risk    Time 4   Period Weeks   Status On-going   PT SHORT TERM GOAL #4   Title Patient will score at least 48 on BERG balance test in order to demonstrate reduced unsteadiness and fall risk    Baseline 3/23- 47   Time 4   Period Weeks   Status On-going   PT SHORT TERM GOAL #5   Title Patient will consistently and correctly perform appropriate HEP, to be updated PRN    Baseline 3/23- has been doing them every day    Time 4   Period Weeks   Status Achieved           PT Long Term Goals - 08/11/15 1335    PT LONG TERM GOAL #1   Title Patient to demonstrate strength 4/5 in all tested muscle groups in order to improve functional stability and mobility, reduce fall risk    Time 8   Period Weeks   Status On-going   PT LONG TERM GOAL #2   Title Patient to  report no falls,  LOB, or close calls with falls within the past 4 weeks in order to demonstrate improved functional and improved safety at home and in community    Time 8   Period Weeks   Status Achieved   PT LONG TERM GOAL #3   Title Patient to be able to ambulate at least 1231ft during 6MWT with LRAD, no rest breaks and no unsteadiness, in order to demosntrate improved general function and readiness to perform community based tasks    Time 8   Period Weeks   Status On-going   PT LONG TERM GOAL #4   Title Patient to score at least 52 on BERG balance test in order to demonstrate minimal fall risk during mobility    Time 8   Period Weeks   Status On-going   PT LONG TERM GOAL #5   Title Patient to be performing at least 30 minutes of exercise, at least 5 days per week, in order to maintain functional gains and to promote improved health habits    Time 8   Period Weeks   Status On-going               Plan - 08/31/15 1311    Clinical Impression Statement PT tx was focused on gait training and static/dynamic standing balance activities. Initiated PT tx with gait training on treadmill with focus on upright posture, increased stride length, and heel to toe sequence. Progressed to over ground gait training with focus on increased cadence and increased step length. Patient demo improved heel to toe gait pattern with practice with less cueing required. Occasional cues required to increase BOS during gait training. Reviewed and instructed the patient on 5/5 fall precautions ( proper shoe wear, use of AD at all times, remove throw rugs, remove clutter, and use of night light). Progressed to dynamic balance training activities with focus on large movements and crossing midline. Patient was unsteady at times during tandem walking and with functional reach across midline while standing on airex pad. Patient tolerated tx well with no adverse effects reported or assessed. Patient reported a "moderate" exertion level  during today's PT tx. Continue with current POC with focus on PWR program.    Rehab Potential Excellent   Clinical Impairments Affecting Rehab Potential stress fracture L LE    PT Frequency 2x / week   PT Duration 4 weeks   PT Treatment/Interventions ADLs/Self Care Home Management;DME Instruction;Gait training;Stair training;Functional mobility training;Therapeutic activities;Therapeutic exercise;Balance training;Neuromuscular re-education;Patient/family education;Manual techniques;Energy conservation;Taping   PT Next Visit Plan Contineu with current POC with focus on PWR program. Get RPE at end of session.    PT Home Exercise Plan Reviewed with no updates made this visit       Patient will benefit from skilled therapeutic intervention in order to improve the following deficits and impairments:  Abnormal gait, Decreased strength, Decreased balance, Decreased mobility, Difficulty walking, Decreased coordination, Postural dysfunction, Impaired flexibility  Visit Diagnosis: Muscle weakness (generalized)  Other abnormalities of gait and mobility  History of falling  Abnormal posture  Unspecified lack of coordination     Problem List Patient Active Problem List   Diagnosis Date Noted  . CAD (coronary artery disease) 08/14/2015  . Chest pain 08/14/2015  . Constipation 08/08/2015  . Seizures ([place]) 06/14/2015  . Degenerative arthritis of thumb 03/03/2015  . Abnormality of gait 08/12/2014  . Parkinsonism ([place]) 08/12/2014  . Stroke ([place]) 08/12/2014  . Muscle weakness (generalized) 02/22/2014  . Stiffness of left knee  02/22/2014  . Hamstring tightness of left lower extremity 02/22/2014  . Pain in joint, pelvic region and thigh 02/22/2014  . Difficulty walking 04/07/2013  . Osteoporosis 03/10/2013  . Anemia 03/10/2013  . Mild malnutrition ([place]) 03/10/2013  . S/P left hip hemiarthroplasty 12/30/2012  . Osteoporosis with pathological fracture with delayed healing 11/25/2012  . Pedal  edema 10/27/2012  . Fracture of sacrum ([place]) 10/02/2012  . Stricture and stenosis of esophagus 09/24/2011  . Hyperlipidemia 11/03/2008  . CAD, UNSPECIFIED SITE 11/03/2008    Garen Lah, PT, DPT   08/31/2015, 6:15 PM  [facility] [address] [place], [place], [address] Phone: [phone]   Fax:  [phone]  Name: Samantha Hudson MRN: [id number] Date of Birth: 01-04-1935

## 2015-09-02 ENCOUNTER — Ambulatory Visit (HOSPITAL_COMMUNITY)
Admission: RE | Admit: 2015-09-02 | Discharge: 2015-09-02 | Disposition: A | Discharge status: Home / Self Care | Payer: Medicare Other | Source: Ambulatory Visit | Attending: Family Medicine | Admitting: Family Medicine

## 2015-09-02 DIAGNOSIS — R0600 Dyspnea, unspecified: Secondary | ICD-10-CM | POA: Diagnosis present

## 2015-09-02 LAB — PULMONARY FUNCTION TEST
DL/VA % PRED: 61 %
DL/VA: 3.07 ml/min/mmHg/L
DLCO UNC % PRED: 42 %
DLCO unc: 11.09 ml/min/mmHg
FEF 25-75 POST: 2.2 L/s
FEF 25-75 PRE: 1.86 L/s
FEF2575-%CHANGE-POST: 18 %
FEF2575-%PRED-POST: 138 %
FEF2575-%PRED-PRE: 117 %
FEV1-%CHANGE-POST: 4 %
FEV1-%PRED-POST: 100 %
FEV1-%Pred-Pre: 95 %
FEV1-Post: 2.14 L
FEV1-Pre: 2.05 L
FEV1FVC-%CHANGE-POST: 5 %
FEV1FVC-%PRED-PRE: 103 %
FEV6-%Change-Post: 0 %
FEV6-%PRED-POST: 97 %
FEV6-%PRED-PRE: 97 %
FEV6-POST: 2.64 L
FEV6-PRE: 2.64 L
FEV6FVC-%Pred-Post: 105 %
FEV6FVC-%Pred-Pre: 105 %
FVC-%CHANGE-POST: 0 %
FVC-%PRED-POST: 92 %
FVC-%Pred-Pre: 93 %
FVC-PRE: 2.66 L
FVC-Post: 2.64 L
POST FEV1/FVC RATIO: 81 %
Post FEV6/FVC ratio: 100 %
Pre FEV1/FVC ratio: 77 %
Pre FEV6/FVC Ratio: 100 %
RV % pred: 65 %
RV: 1.59 L
TLC % pred: 78 %
TLC: 4.15 L

## 2015-09-02 MED ORDER — ALBUTEROL SULFATE (2.5 MG/3ML) 0.083% IN NEBU
2.5000 mg | INHALATION_SOLUTION | Freq: Once | RESPIRATORY_TRACT | Status: AC
  Administered 2015-09-02: 2.5 mg via RESPIRATORY_TRACT

## 2015-09-05 ENCOUNTER — Encounter: Payer: Self-pay | Admitting: Cardiovascular Disease

## 2015-09-05 ENCOUNTER — Ambulatory Visit (INDEPENDENT_AMBULATORY_CARE_PROVIDER_SITE_OTHER): Payer: Medicare Other | Admitting: Cardiovascular Disease

## 2015-09-05 ENCOUNTER — Encounter: Payer: Self-pay | Admitting: *Deleted

## 2015-09-05 VITALS — BP 112/60 | HR 80 | Ht 65.0 in | Wt 103.0 lb

## 2015-09-05 DIAGNOSIS — I25118 Atherosclerotic heart disease of native coronary artery with other forms of angina pectoris: Secondary | ICD-10-CM

## 2015-09-05 DIAGNOSIS — Z87898 Personal history of other specified conditions: Secondary | ICD-10-CM | POA: Diagnosis not present

## 2015-09-05 DIAGNOSIS — R079 Chest pain, unspecified: Secondary | ICD-10-CM | POA: Diagnosis not present

## 2015-09-05 DIAGNOSIS — E785 Hyperlipidemia, unspecified: Secondary | ICD-10-CM | POA: Diagnosis not present

## 2015-09-05 DIAGNOSIS — Z9289 Personal history of other medical treatment: Secondary | ICD-10-CM

## 2015-09-05 NOTE — Patient Instructions (Addendum)
Your physician has requested that you have a lexiscan myoview. For further information please visit [email or web address]. Please follow instruction sheet, as given. Office will contact with results via phone or letter.   Continue all current medications. Follow up in  3 months.   

## 2015-09-05 NOTE — Progress Notes (Signed)
Patient ID: Samantha Hudson, female   DOB: 11-03-1934, 81 y.o.   MRN: [id number]       CARDIOLOGY CONSULT NOTE  Patient ID: RAMISA Hudson MRN: [id number] DOB/AGE: 1934/10/10 69 y.o.  Admit date: (Not on file) Primary Physician Sallee Lange, MD  Reason for Consultation: chest pain, CAD  HPI: The patient is a 81 year old woman who is referred for the evaluation of coronary artery disease and chest pain. She was hospitalized for atypical chest pain in March. Troponins were normal and echocardiogram demonstrated normal left ventricular systolic function and regional wall motion, EF 60-65%.  ECG showed sinus rhythm with no ischemic ST segment or T-wave abnormalities. As per the electronic medical record, she had a coronary artery stent placed in 2000. She also has a history of a stroke and hyperlipidemia.  The episode of chest pain was transient and resolved spontaneously. Her husband it was more shortness of breath which she experiences from time to time. Currently denies chest pain and leg swelling and seldom has palpitations.  He is a retired Software engineer in [place] of over 28 years.      Current Outpatient Prescriptions  Medication Sig Dispense Refill  . ALPRAZolam (XANAX) 0.25 MG tablet Take 1 tablet (0.25 mg total) by mouth at bedtime as needed for anxiety. 30 tablet 5  . atenolol (TENORMIN) 25 MG tablet Take 12.5 mg by mouth 2 (two) times daily.    Marland Kitchen atorvastatin (LIPITOR) 10 MG tablet TAKE 1 TABLET AT BEDTIME. 30 tablet 5  . calcium-vitamin D (OSCAL WITH D) 500-200 MG-UNIT per tablet Take 1 tablet by mouth 3 (three) times daily.     . carbidopa-levodopa (SINEMET IR) 25-100 MG tablet TAKE (2) TABLETS THREE TIMES DAILY. 180 tablet 11  . cholecalciferol (VITAMIN D) 400 UNITS TABS tablet Take 400 Units by mouth daily.    Marland Kitchen dipyridamole-aspirin (AGGRENOX) 200-25 MG 12hr capsule TAKE 1 CAPSULE TWICE A DAY 60 capsule 5  . DULoxetine (CYMBALTA) 30 MG capsule TAKE (1) CAPSULE DAILY.  30 capsule 5  . gabapentin (NEURONTIN) 100 MG capsule TAKE 2 CAPSULE IN THE MORNING AND 1 CAPSULE IN THE AFTERNNON 90 capsule 5  . gabapentin (NEURONTIN) 400 MG capsule Take 400 mg by mouth at bedtime.    . hydroxypropyl methylcellulose / hypromellose (ISOPTO TEARS / GONIOVISC) 2.5 % ophthalmic solution Place 1 drop into both eyes as needed for dry eyes. Reported on 06/20/2015    . LamoTRIgine XR 200 MG TB24 Take 1 tablet (200 mg total) by mouth at bedtime. (Patient taking differently: Take 200 mg by mouth at bedtime. Will start 200 mg on Wednesday and stop Keppra) 90 tablet 4  . levETIRAcetam (KEPPRA) 750 MG tablet Take 1 tablet by mouth at bedtime.    . polyethylene glycol powder (GLYCOLAX/MIRALAX) powder Take 17 g by mouth 2 (two) times daily. (Patient taking differently: Take 17 g by mouth daily as needed for mild constipation. ) 255 g 0  . potassium chloride (K-DUR) 10 MEQ tablet TAKE (1) TABLET TWICE A DAY. 60 tablet 5  . ranitidine (ZANTAC) 300 MG tablet TAKE 1 TABLET DAILY AS DIRECTED 30 tablet 5  . torsemide (DEMADEX) 20 MG tablet TAKE 1/2 TABLET EACH MORNING. (Patient taking differently: TAKE 1/2 TABLET EACH MORNING AS NEEDED FOR FLUID.) 15 tablet 0  . traMADol (ULTRAM) 50 MG tablet Take 1 tablet (50 mg total) by mouth every 6 (six) hours as needed for moderate pain. 60 tablet 3  . zoledronic acid (RECLAST) 5 MG/100ML SOLN  injection Inject 5 mg into the vein once. Takes yearly in December     No current facility-administered medications for this visit.    Past Medical History  Diagnosis Date  . Hyperlipemia   . IBS (irritable bowel syndrome)   . Osteoporosis   . Fibromyalgia   . Glaucoma     HAD LASER SURGERY - NO EYE DROPS REQUIRED  . Globus hystericus   . Fibromyalgia   . Meningioma ([place])   . CAD (coronary artery disease)     HEART STENT PLACED ABOUT 2000- NO LONGER SEES CARDIOLOGIST; NO C/O OF CHEST PAINS OR SOB  . Heart murmur     MVP SINCE THE 60'S - TAKES ATENOLOL -  .  Anxiety   . Swelling     BILATERAL FEET AND LEGS - ONSET OF SWELLING APRIL 2014  . GERD (gastroesophageal reflux disease)   . Arthritis   . Fracture   . CVA (cerebral vascular accident) ([place])     STROKE 7 [place] TO REMOVE BENIGN MENINGIOMA - HAS LEFT SIDED WEAKNESS AND A LITTLE NUMBNESS  . Pain     "EXCRUCIATING PAIN" LEFT HIP - HAS A FRACTURE - IS CONFINED TO W/C AT HOME - NO WEIGHT BEARING LEFT HIP.  Marland Kitchen Parkinson's disease [facility])     Past Surgical History  Procedure Laterality Date  . Brain surgery      tumor removed  . Tonsillectomy    . Partial hysterectomy    . Glaucoma repair    . Cataract extraction    . Kidney surgery      left - HX ENLARGED LEFT KIDNEY ON XRAY - SURGERY WAS DONE ON URETER   . Colonoscopy    . Coronary angioplasty    . Hip arthroplasty Left 12/29/2012    Procedure: LEFT HIP HEMI ARTHROPLASTY ;  Surgeon: Mauri Pole, MD;  Location: [facility];  Service: Orthopedics;  Laterality: Left;    Social History   Social History  . Marital Status: Married    Spouse Name: N/A  . Number of Children: 2  . Years of Education: 16   Occupational History  . retired Pharmacist, hospital    Social History Main Topics  . Smoking status: Never Smoker   . Smokeless tobacco: Never Used  . Alcohol Use: No  . Drug Use: No  . Sexual Activity: Not on file   Other Topics Concern  . Not on file   Social History Narrative   Lives at home with husband.   Right- handed   Occasional caffeine use.     No family history of premature CAD in 1st degree relatives.  Prior to Admission medications   Medication Sig Start Date End Date Taking? Authorizing Provider  ALPRAZolam (XANAX) 0.25 MG tablet Take 1 tablet (0.25 mg total) by mouth at bedtime as needed for anxiety. 08/17/15   Marcial Pacas, MD  atenolol (TENORMIN) 25 MG tablet TAKE (1) TABLET TWICE A DAY. 03/21/15   Kathyrn Drown, MD  atorvastatin (LIPITOR) 10 MG tablet TAKE 1 TABLET AT BEDTIME. 06/23/15   Kathyrn Drown, MD  calcium-vitamin D (OSCAL WITH D) 500-200 MG-UNIT per tablet Take 1 tablet by mouth 3 (three) times daily.     Historical Provider, MD  carbidopa-levodopa (SINEMET IR) 25-100 MG tablet TAKE (2) TABLETS THREE TIMES DAILY. 07/18/15   Marcial Pacas, MD  cholecalciferol (VITAMIN D) 400 UNITS TABS tablet Take 400 Units by mouth daily.  Historical Provider, MD  dipyridamole-aspirin (AGGRENOX) 200-25 MG 12hr capsule TAKE 1 CAPSULE TWICE A DAY 05/18/15   Kathyrn Drown, MD  DULoxetine (CYMBALTA) 30 MG capsule TAKE (1) CAPSULE DAILY. 08/22/15   Kathyrn Drown, MD  DULoxetine (CYMBALTA) 30 MG capsule TAKE (1) CAPSULE DAILY. 08/22/15   Kathyrn Drown, MD  gabapentin (NEURONTIN) 100 MG capsule TAKE 2 CAPSULE IN THE MORNING AND 1 CAPSULE IN THE AFTERNNON 06/23/15   Kathyrn Drown, MD  gabapentin (NEURONTIN) 400 MG capsule TAKE 1 CAPSULE TWICE A DAY Patient taking differently: TAKE 1 CAPSULE AT BEDTIME. 05/04/15   Kathyrn Drown, MD  hydroxypropyl methylcellulose / hypromellose (ISOPTO TEARS / GONIOVISC) 2.5 % ophthalmic solution Place 1 drop into both eyes as needed for dry eyes. Reported on 06/20/2015    Historical Provider, MD  LamoTRIgine XR 200 MG TB24 Take 1 tablet (200 mg total) by mouth at bedtime. 08/17/15   Marcial Pacas, MD  levETIRAcetam (KEPPRA) 750 MG tablet Take 1 tablet by mouth at bedtime. 06/08/15   Historical Provider, MD  polyethylene glycol powder (GLYCOLAX/MIRALAX) powder Take 17 g by mouth 2 (two) times daily. Patient taking differently: Take 17 g by mouth daily as needed for mild constipation.  01/01/13   Danae Orleans, PA-C  potassium chloride (K-DUR) 10 MEQ tablet TAKE (1) TABLET TWICE A DAY. 06/23/15   Kathyrn Drown, MD  ranitidine (ZANTAC) 300 MG tablet TAKE 1 TABLET DAILY AS DIRECTED 06/23/15   Kathyrn Drown, MD  torsemide (DEMADEX) 20 MG tablet TAKE 1/2 TABLET EACH MORNING. Patient taking differently: TAKE 1/2 TABLET EACH MORNING AS NEEDED FOR FLUID. 04/04/15   Kathyrn Drown, MD    traMADol (ULTRAM) 50 MG tablet Take 1 tablet (50 mg total) by mouth every 6 (six) hours as needed for moderate pain. 04/21/15   Kathyrn Drown, MD  zoledronic acid (RECLAST) 5 MG/100ML SOLN injection Inject 5 mg into the vein once. Takes yearly in December    Historical Provider, MD     Review of systems complete and found to be negative unless listed above in HPI     Physical exam Blood pressure 112/60, pulse 80, height 5\' 5"  (1.651 m), weight 103 lb (46.72 kg). General: NAD Neck: No JVD, no thyromegaly or thyroid nodule.  Lungs: Clear to auscultation bilaterally with normal respiratory effort. CV: Nondisplaced PMI. Regular rate and rhythm, normal S1/S2, no S3/S4, no murmur.  No peripheral edema.   Abdomen: Soft, nontender, no distention.  Skin: Intact without lesions or rashes.  Neurologic: Alert and oriented.  Psych: Normal affect.  ECG: Most recent ECG reviewed.  Labs:   Lab Results  Component Value Date   WBC 4.9 08/14/2015   HGB 10.5* 08/14/2015   HCT 32.1* 08/14/2015   MCV 91.2 08/14/2015   PLT 199 08/14/2015   No results for input(s): NA, K, CL, CO2, BUN, CREATININE, CALCIUM, PROT, BILITOT, ALKPHOS, ALT, AST, GLUCOSE in the last 168 hours.  Invalid input(s): LABALBU Lab Results  Component Value Date   TROPONINI <0.03 08/14/2015    Lab Results  Component Value Date   CHOL 155 02/18/2015   CHOL 178 08/02/2014   CHOL 145 09/15/2013   Lab Results  Component Value Date   HDL 70 02/18/2015   HDL 84 08/02/2014   HDL 63 09/15/2013   Lab Results  Component Value Date   LDLCALC 74 02/18/2015   LDLCALC 83 08/02/2014   LDLCALC 70 09/15/2013   Lab Results  Component  Value Date   TRIG 54 02/18/2015   TRIG 53 08/02/2014   TRIG 59 09/15/2013   Lab Results  Component Value Date   CHOLHDL 2.2 02/18/2015   CHOLHDL 2.1 08/02/2014   CHOLHDL 2.3 09/15/2013   No results found for: LDLDIRECT       Studies: No results found.  ASSESSMENT AND PLAN:  1.  Chest pain/CAD with stent placement: Currently stable. Continue statin, beta blocker, and ASA. As it has been several years since stent placement, will obtain Lexiscan Cardiolite (h/o CVA) stress test for ischemic evaluation. LVEF and regional wall motion were normal. Continue current medical therapy.  2. Hyperlipidemia: On statin therapy.  Dispo: fu 3 months in [place].   Signed: Kate Sable, M.D., F.A.C.C.  09/05/2015, 1:30 PM

## 2015-09-06 ENCOUNTER — Ambulatory Visit (HOSPITAL_COMMUNITY): Payer: Medicare Other | Admitting: Physical Therapy

## 2015-09-06 DIAGNOSIS — Z9181 History of falling: Secondary | ICD-10-CM

## 2015-09-06 DIAGNOSIS — G20 Parkinson's disease: Secondary | ICD-10-CM | POA: Diagnosis not present

## 2015-09-06 DIAGNOSIS — M6281 Muscle weakness (generalized): Secondary | ICD-10-CM

## 2015-09-06 DIAGNOSIS — R293 Abnormal posture: Secondary | ICD-10-CM

## 2015-09-06 DIAGNOSIS — R279 Unspecified lack of coordination: Secondary | ICD-10-CM

## 2015-09-06 DIAGNOSIS — R2689 Other abnormalities of gait and mobility: Secondary | ICD-10-CM

## 2015-09-06 NOTE — Therapy (Signed)
[place], [place], [address] Phone: [phone]   Fax:  [phone]  Physical Therapy Treatment (Re-Assess)  Patient Details  Name: Samantha Hudson MRN: [id number] Date of Birth: May 23, 1934 Referring Provider: Marcial Pacas   Encounter Date: 09/06/2015      PT End of Session - 09/06/15 1835    Visit Number 10   Number of Visits 18   Date for PT Re-Evaluation 10/04/15   Authorization Type Medicare and BCBS (G-code done 10th session)   Authorization Time Period 6/73/41 to 9/37/90; recert done 2/40    Authorization - Visit Number 10   Authorization - Number of Visits 20   PT Start Time 1351   PT Stop Time 1432   PT Time Calculation (min) 41 min   Equipment Utilized During Treatment Gait belt   Activity Tolerance Patient tolerated treatment well   Behavior During Therapy [facility] for tasks assessed/performed      Past Medical History  Diagnosis Date  . Hyperlipemia   . IBS (irritable bowel syndrome)   . Osteoporosis   . Fibromyalgia   . Glaucoma     HAD LASER SURGERY - NO EYE DROPS REQUIRED  . Globus hystericus   . Fibromyalgia   . Meningioma ([place])   . CAD (coronary artery disease)     HEART STENT PLACED ABOUT 2000- NO LONGER SEES CARDIOLOGIST; NO C/O OF CHEST PAINS OR SOB  . Heart murmur     MVP SINCE THE 60'S - TAKES ATENOLOL -  . Anxiety   . Swelling     BILATERAL FEET AND LEGS - ONSET OF SWELLING APRIL 2014  . GERD (gastroesophageal reflux disease)   . Arthritis   . Fracture   . CVA (cerebral vascular accident) ([place])     STROKE 7 [place] TO REMOVE BENIGN MENINGIOMA - HAS LEFT SIDED WEAKNESS AND A LITTLE NUMBNESS  . Pain     "EXCRUCIATING PAIN" LEFT HIP - HAS A FRACTURE - IS CONFINED TO W/C AT HOME - NO WEIGHT BEARING LEFT HIP.  Marland Kitchen Parkinson's disease [facility])     Past Surgical History  Procedure Laterality Date  . Brain surgery      tumor removed  . Tonsillectomy    .  Partial hysterectomy    . Glaucoma repair    . Cataract extraction    . Kidney surgery      left - HX ENLARGED LEFT KIDNEY ON XRAY - SURGERY WAS DONE ON URETER   . Colonoscopy    . Coronary angioplasty    . Hip arthroplasty Left 12/29/2012    Procedure: LEFT HIP HEMI ARTHROPLASTY ;  Surgeon: Mauri Pole, MD;  Location: [facility];  Service: Orthopedics;  Laterality: Left;    There were no vitals filed for this visit.      Subjective Assessment - 09/06/15 1355    Subjective Patient reports she is doing OK today, no major problems today and reports taht except for 1 or 2 things last session was not difficult. She reports she feels like she is getting better; however, walking backwards is still hard, balance does feel like its a little better. However she does report taht she is concerned about some of her medications as she is having increased posterior LOB recently.    Pertinent History CAD, hx of CVA, PD, osteoporosis, L hip replacement, hx of possible seizure, fibromyalgia    Patient Stated Goals less pain in  L hip and foot and back; improve balance    Currently in Pain? Yes   Pain Score 5    Pain Location Leg   Pain Orientation Left   Pain Descriptors / Indicators Aching   Pain Type Chronic pain   Pain Radiating Towards none    Pain Onset More than a month ago   Pain Frequency Constant   Aggravating Factors  overdoing it    Pain Relieving Factors sitting down and elevating foot    Effect of Pain on Daily Activities sometimes keeps her from doing "not ultra important" tasks, and going out as much             [facility] PT Assessment - 09/06/15 0001    Assessment   Medical Diagnosis cva/parkinsons/gait    Referring Provider Marcial Pacas    Onset Date/Surgical Date --  chronic    Next MD Visit July 2017 with Dr. Krista Blue    Precautions   Precautions None   Restrictions   Weight Bearing Restrictions No   Balance Screen   Has the patient fallen in the past 6 months No   Has the  patient had a decrease in activity level because of a fear of falling?  No   Is the patient reluctant to leave their home because of a fear of falling?  No   6 minute walk test results    Aerobic Endurance Distance Walked 708   Endurance additional comments 6MWT; no device, one rest break, unsafe navigation of corners and obstacles    [employer]   Sit to Stand Able to stand without using hands and stabilize independently   Standing Unsupported Able to stand safely 2 minutes   Sitting with Back Unsupported but Feet Supported on Floor or Stool Able to sit safely and securely 2 minutes   Stand to Sit Sits safely with minimal use of hands   Transfers Able to transfer safely, minor use of hands   Standing Unsupported with Eyes Closed Able to stand 10 seconds safely   Standing Ubsupported with Feet Together Able to place feet together independently and stand 1 minute safely   From Standing, Reach Forward with Outstretched Arm Can reach forward >12 cm safely (5")   From Standing Position, Pick up Object from Floor Able to pick up shoe, needs supervision   From Standing Position, Turn to Look Behind Over each Shoulder Looks behind one side only/other side shows less weight shift   Turn 360 Degrees Able to turn 360 degrees safely but slowly   Standing Unsupported, Alternately Place Feet on Step/Stool Able to stand independently and complete 8 steps >20 seconds   Standing Unsupported, One Foot in Front Able to take small step independently and hold 30 seconds   Standing on One Leg Able to lift leg independently and hold 5-10 seconds   Total Score 47                             PT Education - 09/06/15 [place]    Education provided Yes   Education Details progress with skilled PT servies, plan of care moving forward    Person(s) Educated Patient   Methods Explanation   Comprehension Verbalized understanding          PT Short Term Goals - 09/06/15 1423    PT SHORT TERM  GOAL #1   Title Patient will be able to maintain correct posture at least 70%  of the time during all functional situations and scenarios in order to improve overall mobility and reduce fall risk    Time 4   Period Weeks   Status On-going   PT SHORT TERM GOAL #2   Title Patient will be able to complete bed mobility with mod(I), correct sequencnig in order to enhance safety in mobility at home    Time 4   Period Weeks   Status Achieved   PT SHORT TERM GOAL #3   Title Patient will be able to ambulate at least 840f with LRAD, no rest breaks and no unsteadiness, in order to demonstrate improved mobility with reduced fall risk    Baseline 4/18- 7016fwith no device    Time 4   Period Weeks   Status On-going   PT SHORT TERM GOAL #4   Title Patient will score at least 48 on BERG balance test in order to demonstrate reduced unsteadiness and fall risk    Baseline 4/18- 47   PT SHORT TERM GOAL #5   Title Patient will consistently and correctly perform appropriate HEP, to be updated PRN    Baseline 4/18- reports she has not been doing them very often lately            PT Long Term Goals - 09/06/15 1427    PT LONG TERM GOAL #1   Title Patient to demonstrate strength 4/5 in all tested muscle groups in order to improve functional stability and mobility, reduce fall risk    Baseline 4/18- DNT today    Time 8   Period Weeks   PT LONG TERM GOAL #2   Title Patient to report no falls, LOB, or close calls with falls within the past 4 weeks in order to demonstrate improved functional and improved safety at home and in community    Baseline 4/18- had some close calls    Time 8   Period Weeks   Status Partially Met   PT LONG TERM GOAL #3   Title Patient to be able to ambulate at least 120034furing 6MWT with LRAD, no rest breaks and no unsteadiness, in order to demosntrate improved general function and readiness to perform community based tasks    Time 8   Period Weeks   Status On-going   PT  LONG TERM GOAL #4   Title Patient to score at least 52 on BERG balance test in order to demonstrate minimal fall risk during mobility    Time 8   Period Weeks   Status On-going   PT LONG TERM GOAL #5   Title Patient to be performing at least 30 minutes of exercise, at least 5 days per week, in order to maintain functional gains and to promote improved health habits    Time 8   Period Weeks   Status On-going               Plan - 09/06/15 1836    Clinical Impression Statement Re-assess. Patient does show some improvement in gait tolerance and distance, however does remain limited in terms of functional activity tolerance and balance; strength not tested today due to time limitations. Patient has been limited in progression with skilled PT services due to other ongoing and emergent health issues, and will benefti from ongoing skilled PT services; OT referral was recieved and had front desk schedule this as well.    Rehab Potential Excellent   Clinical Impairments Affecting Rehab Potential stress fracture L LE    PT  Frequency 2x / week   PT Duration 4 weeks   PT Treatment/Interventions ADLs/Self Care Home Management;DME Instruction;Gait training;Stair training;Functional mobility training;Therapeutic activities;Therapeutic exercise;Balance training;Neuromuscular re-education;Patient/family education;Manual techniques;Energy conservation;Taping   PT Next Visit Plan Contineu with current POC with focus on PWR program. Get RPE at end of session. Hip ABD strength.    PT Home Exercise Plan Reviewed with no updates made this visit    Consulted and Agree with Plan of Care Patient      Patient will benefit from skilled therapeutic intervention in order to improve the following deficits and impairments:  Abnormal gait, Decreased strength, Decreased balance, Decreased mobility, Difficulty walking, Decreased coordination, Postural dysfunction, Impaired flexibility  Visit Diagnosis: Muscle  weakness (generalized) - Plan: PT plan of care cert/re-cert  Other abnormalities of gait and mobility - Plan: PT plan of care cert/re-cert  History of falling - Plan: PT plan of care cert/re-cert  Abnormal posture - Plan: PT plan of care cert/re-cert  Unspecified lack of coordination - Plan: PT plan of care cert/re-cert       G-Codes - 10-05-15 1840    Functional Assessment Tool Used Based on skilled clinical assessment of strength, gait, balance, functional mobility, functional activity tolerance    Functional Limitation Mobility: Walking and moving around   Mobility: Walking and Moving Around Current Status (M8406) At least 20 percent but less than 40 percent impaired, limited or restricted   Mobility: Walking and Moving Around Goal Status (R8614) At least 1 percent but less than 20 percent impaired, limited or restricted      Problem List Patient Active Problem List   Diagnosis Date Noted  . CAD (coronary artery disease) 08/14/2015  . Chest pain 08/14/2015  . Constipation 08/08/2015  . Seizures ([place]) 06/14/2015  . Degenerative arthritis of thumb 03/03/2015  . Abnormality of gait 08/12/2014  . Parkinsonism ([place]) 08/12/2014  . Stroke ([place]) 08/12/2014  . Muscle weakness (generalized) 02/22/2014  . Stiffness of left knee 02/22/2014  . Hamstring tightness of left lower extremity 02/22/2014  . Pain in joint, pelvic region and thigh 02/22/2014  . Difficulty walking 04/07/2013  . Osteoporosis 03/10/2013  . Anemia 03/10/2013  . Mild malnutrition ([place]) 03/10/2013  . S/P left hip hemiarthroplasty 12/30/2012  . Osteoporosis with pathological fracture with delayed healing 11/25/2012  . Pedal edema 10/27/2012  . Fracture of sacrum ([place]) 10/02/2012  . Stricture and stenosis of esophagus 09/24/2011  . Hyperlipidemia 11/03/2008  . CAD, UNSPECIFIED SITE 11/03/2008    Deniece Ree PT, DPT [phone]  [place], [place], [address] Phone: [phone]   Fax:  [phone]  Name: Samantha Hudson MRN: [id number] Date of Birth: 10-23-1934

## 2015-09-07 ENCOUNTER — Other Ambulatory Visit: Payer: Self-pay | Admitting: Family Medicine

## 2015-09-07 ENCOUNTER — Telehealth: Payer: Self-pay | Admitting: Neurology

## 2015-09-07 NOTE — Telephone Encounter (Signed)
Pt said she is having stress test on Monday and is inquiring if she should take her routine medications. Operator recommended she call [facility] and check with that dept but she is wanting to speak with Sharyn Lull first.

## 2015-09-07 NOTE — Telephone Encounter (Signed)
Returned call to patient - she has spoken with the ordering provider for the test and has been instructed to take all medications, as prescribed.

## 2015-09-08 ENCOUNTER — Ambulatory Visit (HOSPITAL_COMMUNITY): Payer: Medicare Other | Admitting: Physical Therapy

## 2015-09-08 DIAGNOSIS — M6281 Muscle weakness (generalized): Secondary | ICD-10-CM

## 2015-09-08 DIAGNOSIS — Z9181 History of falling: Secondary | ICD-10-CM

## 2015-09-08 DIAGNOSIS — G20 Parkinson's disease: Secondary | ICD-10-CM | POA: Diagnosis not present

## 2015-09-08 DIAGNOSIS — R279 Unspecified lack of coordination: Secondary | ICD-10-CM

## 2015-09-08 DIAGNOSIS — R293 Abnormal posture: Secondary | ICD-10-CM

## 2015-09-08 DIAGNOSIS — R2689 Other abnormalities of gait and mobility: Secondary | ICD-10-CM

## 2015-09-08 NOTE — Therapy (Signed)
[place], [place], [address] Phone: [phone]   Fax:  [phone]  Physical Therapy Treatment  Patient Details  Name: Samantha Hudson MRN: [id number] Date of Birth: Oct 01, 1934 Referring Provider: Marcial Pacas   Encounter Date: 09/08/2015      PT End of Session - 09/08/15 1755    Visit Number 11   Number of Visits 18   Date for PT Re-Evaluation 10/04/15   Authorization Type Medicare and BCBS (G-code done 10th session)   Authorization Time Period 1/94/17 to 08/26/12; recert done 4/81    Authorization - Visit Number 11   Authorization - Number of Visits 20   PT Start Time 1519   PT Stop Time 1600   PT Time Calculation (min) 41 min   Equipment Utilized During Treatment Gait belt   Activity Tolerance Patient tolerated treatment well   Behavior During Therapy [facility] for tasks assessed/performed      Past Medical History  Diagnosis Date  . Hyperlipemia   . IBS (irritable bowel syndrome)   . Osteoporosis   . Fibromyalgia   . Glaucoma     HAD LASER SURGERY - NO EYE DROPS REQUIRED  . Globus hystericus   . Fibromyalgia   . Meningioma ([place])   . CAD (coronary artery disease)     HEART STENT PLACED ABOUT 2000- NO LONGER SEES CARDIOLOGIST; NO C/O OF CHEST PAINS OR SOB  . Heart murmur     MVP SINCE THE 60'S - TAKES ATENOLOL -  . Anxiety   . Swelling     BILATERAL FEET AND LEGS - ONSET OF SWELLING APRIL 2014  . GERD (gastroesophageal reflux disease)   . Arthritis   . Fracture   . CVA (cerebral vascular accident) ([place])     STROKE 7 [place] TO REMOVE BENIGN MENINGIOMA - HAS LEFT SIDED WEAKNESS AND A LITTLE NUMBNESS  . Pain     "EXCRUCIATING PAIN" LEFT HIP - HAS A FRACTURE - IS CONFINED TO W/C AT HOME - NO WEIGHT BEARING LEFT HIP.  Marland Kitchen Parkinson's disease [facility])     Past Surgical History  Procedure Laterality Date  . Brain surgery      tumor removed  . Tonsillectomy    . Partial  hysterectomy    . Glaucoma repair    . Cataract extraction    . Kidney surgery      left - HX ENLARGED LEFT KIDNEY ON XRAY - SURGERY WAS DONE ON URETER   . Colonoscopy    . Coronary angioplasty    . Hip arthroplasty Left 12/29/2012    Procedure: LEFT HIP HEMI ARTHROPLASTY ;  Surgeon: Mauri Pole, MD;  Location: [facility];  Service: Orthopedics;  Laterality: Left;    There were no vitals filed for this visit.      Subjective Assessment - 09/08/15 1606    Subjective Patient reports increased pain in her L hip today, increased up to 7-8/10; she reports it is worse when she first starts moving and eases off hte more she moves about. It started earlier this week, maybe Sunday or Monday.    Pertinent History CAD, hx of CVA, PD, osteoporosis, L hip replacement, hx of possible seizure, fibromyalgia    Patient Stated Goals less pain in L hip and foot and back; improve balance    Currently in Pain? Yes   Pain Score 8    Pain Location Hip   Pain Orientation  Left   Pain Descriptors / Indicators Sharp   Pain Type Acute pain   Pain Radiating Towards none    Pain Onset More than a month ago   Pain Frequency Intermittent   Aggravating Factors  weight bearing, sitting    Pain Relieving Factors moving around, laying down    Effect of Pain on Daily Activities dificulty with mobility                          [facility] Adult PT Treatment/Exercise - 09/08/15 0001    Ambulation/Gait   Gait Comments 1x464f 1x2251fwit no ddvice, cues for safety and form    Knee/Hip Exercises: Stretches   Other Knee/Hip Stretches supine stretch with bilateral shoulder ER, trunk extension, towels under knees x5 minutes    Knee/Hip Exercises: Supine   Bridges Limitations 1x10   Straight Leg Raises Both;1 set;10 reps   Other Supine Knee/Hip Exercises supine hip abduction with red TB 1x10   Other Supine Knee/Hip Exercises supine clamshells with red TB 1x10           [facility] 09/08/15 1611     PWR! Up 1x15   PWR! Rock 1x15   PWR! Twist 1x15   PWR! Step 1x15   Comments supine, cues for form              PT Education - 09/08/15 1615    Education provided Yes   Education Details more focus on strengthening to try to reduce hip pain    Person(s) Educated Patient   Methods Explanation   Comprehension Verbalized understanding          PT Short Term Goals - 09/06/15 1423    PT SHORT TERM GOAL #1   Title Patient will be able to maintain correct posture at least 70% of the time during all functional situations and scenarios in order to improve overall mobility and reduce fall risk    Time 4   Period Weeks   Status On-going   PT SHORT TERM GOAL #2   Title Patient will be able to complete bed mobility with mod(I), correct sequencnig in order to enhance safety in mobility at home    Time 4   Period Weeks   Status Achieved   PT SHORT TERM GOAL #3   Title Patient will be able to ambulate at least 80025fith LRAD, no rest breaks and no unsteadiness, in order to demonstrate improved mobility with reduced fall risk    Baseline 4/18- [id number] no device    Time 4   Period Weeks   Status On-going   PT SHORT TERM GOAL #4   Title Patient will score at least 48 on BERG balance test in order to demonstrate reduced unsteadiness and fall risk    Baseline 4/18- 47   PT SHORT TERM GOAL #5   Title Patient will consistently and correctly perform appropriate HEP, to be updated PRN    Baseline 4/18- reports she has not been doing them very often lately            PT Long Term Goals - 09/06/15 1427    PT LONG TERM GOAL #1   Title Patient to demonstrate strength 4/5 in all tested muscle groups in order to improve functional stability and mobility, reduce fall risk    Baseline 4/18- DNT today    Time 8   Period Weeks   PT LONG TERM GOAL #2  Title Patient to report no falls, LOB, or close calls with falls within the past 4 weeks in order to demonstrate improved functional and  improved safety at home and in community    Baseline 4/18- had some close calls    Time 8   Period Weeks   Status Partially Met   PT LONG TERM GOAL #3   Title Patient to be able to ambulate at least 1265f during 6MWT with LRAD, no rest breaks and no unsteadiness, in order to demosntrate improved general function and readiness to perform community based tasks    Time 8   Period Weeks   Status On-going   PT LONG TERM GOAL #4   Title Patient to score at least 52 on BERG balance test in order to demonstrate minimal fall risk during mobility    Time 8   Period Weeks   Status On-going   PT LONG TERM GOAL #5   Title Patient to be performing at least 30 minutes of exercise, at least 5 days per week, in order to maintain functional gains and to promote improved health habits    Time 8   Period Weeks   Status On-going               Plan - 09/08/15 1756    Clinical Impression Statement Patient arrived today reporting that she has been having signfiicant pain in her R hip since earlier this week, and that it has been affecting her weight bearing. Started session in supine postural stretch on table today, and performed supine PWR exercises on table today due to high levels of pain. Did perfrom some gait, iduring which patient did demonstrate impaired balance and mechanics during approximately first 30-42funtil "my hip stops hurting and loosens up", after which she did display improved mechanics. Introduced supine strengthening exercises for hip; will continue with these exercises during sessions to attempt to addressing hip pain, however if pain is not affected by strengthening and stabilization of hip, will potentially inform MD.    Rehab Potential Excellent   Clinical Impairments Affecting Rehab Potential stress fracture L LE    PT Frequency 2x / week   PT Duration 4 weeks   PT Treatment/Interventions ADLs/Self Care Home Management;DME Instruction;Gait training;Stair training;Functional  mobility training;Therapeutic activities;Therapeutic exercise;Balance training;Neuromuscular re-education;Patient/family education;Manual techniques;Energy conservation;Taping   PT Next Visit Plan Contineu with current POC with focus on PWR program. Get RPE at end of session. Increase focus on general hip strengthening to attempt to reduce L hip pain.    Consulted and Agree with Plan of Care Patient      Patient will benefit from skilled therapeutic intervention in order to improve the following deficits and impairments:  Abnormal gait, Decreased strength, Decreased balance, Decreased mobility, Difficulty walking, Decreased coordination, Postural dysfunction, Impaired flexibility  Visit Diagnosis: Muscle weakness (generalized)  Other abnormalities of gait and mobility  History of falling  Abnormal posture  Unspecified lack of coordination     Problem List Patient Active Problem List   Diagnosis Date Noted  . CAD (coronary artery disease) 08/14/2015  . Chest pain 08/14/2015  . Constipation 08/08/2015  . Seizures ([place]/24/2017  . Degenerative arthritis of thumb 03/03/2015  . Abnormality of gait 08/12/2014  . Parkinsonism ([facility]/24/2016  . Stroke ([facility]/24/2016  . Muscle weakness (generalized) 02/22/2014  . Stiffness of left knee 02/22/2014  . Hamstring tightness of left lower extremity 02/22/2014  . Pain in joint, pelvic region and thigh 02/22/2014  . Difficulty  walking 04/07/2013  . Osteoporosis 03/10/2013  . Anemia 03/10/2013  . Mild malnutrition ([place]) 03/10/2013  . S/P left hip hemiarthroplasty 12/30/2012  . Osteoporosis with pathological fracture with delayed healing 11/25/2012  . Pedal edema 10/27/2012  . Fracture of sacrum ([place]) 10/02/2012  . Stricture and stenosis of esophagus 09/24/2011  . Hyperlipidemia 11/03/2008  . CAD, UNSPECIFIED SITE 11/03/2008    Deniece Ree PT, DPT [phone]  [place] [address] [place], [place], [address] Phone: [phone]   Fax:  [phone]  Name: DYONNA JASPERS MRN: [id number] Date of Birth: [birth year]

## 2015-09-11 ENCOUNTER — Encounter (HOSPITAL_COMMUNITY): Payer: Self-pay

## 2015-09-11 ENCOUNTER — Emergency Department (HOSPITAL_COMMUNITY): Payer: Medicare Other

## 2015-09-11 ENCOUNTER — Emergency Department (HOSPITAL_COMMUNITY)
Admission: EM | Admit: 2015-09-11 | Discharge: 2015-09-12 | Disposition: A | Discharge status: Home / Self Care | Payer: Medicare Other | Attending: Emergency Medicine | Admitting: Emergency Medicine

## 2015-09-11 DIAGNOSIS — I2510 Atherosclerotic heart disease of native coronary artery without angina pectoris: Secondary | ICD-10-CM | POA: Insufficient documentation

## 2015-09-11 DIAGNOSIS — S20211A Contusion of right front wall of thorax, initial encounter: Secondary | ICD-10-CM | POA: Insufficient documentation

## 2015-09-11 DIAGNOSIS — M180 Bilateral primary osteoarthritis of first carpometacarpal joints: Secondary | ICD-10-CM | POA: Insufficient documentation

## 2015-09-11 DIAGNOSIS — Y999 Unspecified external cause status: Secondary | ICD-10-CM | POA: Diagnosis not present

## 2015-09-11 DIAGNOSIS — S20212A Contusion of left front wall of thorax, initial encounter: Secondary | ICD-10-CM | POA: Insufficient documentation

## 2015-09-11 DIAGNOSIS — Y939 Activity, unspecified: Secondary | ICD-10-CM | POA: Insufficient documentation

## 2015-09-11 DIAGNOSIS — R2681 Unsteadiness on feet: Secondary | ICD-10-CM | POA: Diagnosis not present

## 2015-09-11 DIAGNOSIS — W1830XA Fall on same level, unspecified, initial encounter: Secondary | ICD-10-CM | POA: Diagnosis not present

## 2015-09-11 DIAGNOSIS — E785 Hyperlipidemia, unspecified: Secondary | ICD-10-CM | POA: Diagnosis not present

## 2015-09-11 DIAGNOSIS — S299XXA Unspecified injury of thorax, initial encounter: Secondary | ICD-10-CM | POA: Diagnosis present

## 2015-09-11 DIAGNOSIS — T07XXXA Unspecified multiple injuries, initial encounter: Secondary | ICD-10-CM

## 2015-09-11 DIAGNOSIS — Y929 Unspecified place or not applicable: Secondary | ICD-10-CM | POA: Insufficient documentation

## 2015-09-11 DIAGNOSIS — W19XXXA Unspecified fall, initial encounter: Secondary | ICD-10-CM

## 2015-09-11 NOTE — ED Notes (Signed)
Was getting up from the kitchen table, lost my balance and fell.  History of Parkinson's disease.  Hurting in my left hip and in my lower back on the right side, also have a knot on the right side of my head per pt.  Uses a walker to walk at home.

## 2015-09-11 NOTE — ED Notes (Signed)
Pt states she fell getting up this evening, without use of walker. Family reports she fell onto the floor onto her buttocks and fell to the right side. Family denies LOC.

## 2015-09-11 NOTE — ED Notes (Signed)
PA at bedside.

## 2015-09-11 NOTE — ED Provider Notes (Signed)
CSN: [id number]     Arrival date & time 09/11/15  2154 History   First MD Initiated Contact with Patient 09/11/15 2254     Chief Complaint  Patient presents with  . Fall     (Consider location/radiation/quality/duration/timing/severity/associated sxs/prior Treatment) HPI Comments: Patient is a 81 year old female who presents to the emergency department with a complaint of a fall.  The patient states that earlier today she got up from the kitchen table she was using her walker, she has Parkinson's disease and feels that this caused her to lose her balance. She fell and injured the left hip, the ribs, the lower back and she also has a knot on the right side of her head . She denies any loss of consciousness. No other injury reported.  The history is provided by the patient, the spouse and a relative.    Past Medical History  Diagnosis Date  . Hyperlipemia   . IBS (irritable bowel syndrome)   . Osteoporosis   . Fibromyalgia   . Glaucoma     HAD LASER SURGERY - NO EYE DROPS REQUIRED  . Globus hystericus   . Fibromyalgia   . Meningioma ([place])   . CAD (coronary artery disease)     HEART STENT PLACED ABOUT 2000- NO LONGER SEES CARDIOLOGIST; NO C/O OF CHEST PAINS OR SOB  . Heart murmur     MVP SINCE THE 60'S - TAKES ATENOLOL -  . Anxiety   . Swelling     BILATERAL FEET AND LEGS - ONSET OF SWELLING APRIL 2014  . GERD (gastroesophageal reflux disease)   . Arthritis   . Fracture   . CVA (cerebral vascular accident) ([place])     STROKE 7 [place] TO REMOVE BENIGN MENINGIOMA - HAS LEFT SIDED WEAKNESS AND A LITTLE NUMBNESS  . Pain     "EXCRUCIATING PAIN" LEFT HIP - HAS A FRACTURE - IS CONFINED TO W/C AT HOME - NO WEIGHT BEARING LEFT HIP.  Marland Kitchen Parkinson's disease [facility])    Past Surgical History  Procedure Laterality Date  . Brain surgery      tumor removed  . Tonsillectomy    . Partial hysterectomy    . Glaucoma repair    . Cataract extraction    . Kidney  surgery      left - HX ENLARGED LEFT KIDNEY ON XRAY - SURGERY WAS DONE ON URETER   . Colonoscopy    . Coronary angioplasty    . Hip arthroplasty Left 12/29/2012    Procedure: LEFT HIP HEMI ARTHROPLASTY ;  Surgeon: Mauri Pole, MD;  Location: [facility];  Service: Orthopedics;  Laterality: Left;   Family History  Problem Relation Age of Onset  . Liver disease Father   . Diabetes Father   . Coronary artery disease Mother     deseased  . Heart attack Mother   . Hyperlipidemia Mother   . Breast cancer Sister     x 2  . Cancer Sister     breast   Social History  Substance Use Topics  . Smoking status: Never Smoker   . Smokeless tobacco: Never Used  . Alcohol Use: No   OB History    Gravida Para Term Preterm AB TAB SAB Ectopic Multiple Living   2 2 2       2      Review of Systems  Musculoskeletal: Positive for back pain and arthralgias.  Psychiatric/Behavioral: The patient is nervous/anxious.   All  other systems reviewed and are negative.     Allergies  Lactose intolerance (gi) and Penicillins  Home Medications   Prior to Admission medications   Medication Sig Start Date End Date Taking? Authorizing Provider  ALPRAZolam (XANAX) 0.25 MG tablet Take 1 tablet (0.25 mg total) by mouth at bedtime as needed for anxiety. 08/17/15   Marcial Pacas, MD  atenolol (TENORMIN) 25 MG tablet Take 12.5 mg by mouth 2 (two) times daily.    Historical Provider, MD  atorvastatin (LIPITOR) 10 MG tablet TAKE 1 TABLET AT BEDTIME. 06/23/15   Kathyrn Drown, MD  calcium-vitamin D (OSCAL WITH D) 500-200 MG-UNIT per tablet Take 1 tablet by mouth 3 (three) times daily.     Historical Provider, MD  carbidopa-levodopa (SINEMET IR) 25-100 MG tablet TAKE (2) TABLETS THREE TIMES DAILY. 07/18/15   Marcial Pacas, MD  cholecalciferol (VITAMIN D) 400 UNITS TABS tablet Take 400 Units by mouth daily.    Historical Provider, MD  dipyridamole-aspirin (AGGRENOX) 200-25 MG 12hr capsule TAKE 1 CAPSULE TWICE A DAY 05/18/15    Kathyrn Drown, MD  DULoxetine (CYMBALTA) 30 MG capsule TAKE (1) CAPSULE DAILY. 08/22/15   Kathyrn Drown, MD  gabapentin (NEURONTIN) 100 MG capsule TAKE 2 CAPSULE IN THE MORNING AND 1 CAPSULE IN THE AFTERNNON 06/23/15   Kathyrn Drown, MD  gabapentin (NEURONTIN) 400 MG capsule Take 400 mg by mouth at bedtime.    Historical Provider, MD  hydroxypropyl methylcellulose / hypromellose (ISOPTO TEARS / GONIOVISC) 2.5 % ophthalmic solution Place 1 drop into both eyes as needed for dry eyes. Reported on 06/20/2015    Historical Provider, MD  LamoTRIgine XR 200 MG TB24 Take 1 tablet (200 mg total) by mouth at bedtime. Patient taking differently: Take 200 mg by mouth at bedtime. Will start 200 mg on Wednesday and stop Keppra 08/17/15   Marcial Pacas, MD  levETIRAcetam (KEPPRA) 750 MG tablet Take 1 tablet by mouth at bedtime. 06/08/15   Historical Provider, MD  polyethylene glycol powder (GLYCOLAX/MIRALAX) powder Take 17 g by mouth 2 (two) times daily. Patient taking differently: Take 17 g by mouth daily as needed for mild constipation.  01/01/13   Danae Orleans, PA-C  potassium chloride (K-DUR) 10 MEQ tablet TAKE (1) TABLET TWICE A DAY. 06/23/15   Kathyrn Drown, MD  ranitidine (ZANTAC) 300 MG tablet TAKE 1 TABLET DAILY AS DIRECTED 06/23/15   Kathyrn Drown, MD  torsemide (DEMADEX) 20 MG tablet TAKE 1/2 TABLET EACH MORNING. 09/07/15   Kathyrn Drown, MD  traMADol (ULTRAM) 50 MG tablet Take 1 tablet (50 mg total) by mouth every 6 (six) hours as needed for moderate pain. 04/21/15   Kathyrn Drown, MD  zoledronic acid (RECLAST) 5 MG/100ML SOLN injection Inject 5 mg into the vein once. Takes yearly in December    Historical Provider, MD   BP 130/42 mmHg  Pulse 73  Temp(Src) 97.5 F (36.4 C) (Oral)  Ht 5' 5.5" (1.664 m)  Wt 47.628 kg  BMI 17.20 kg/m2  SpO2 100% Physical Exam  Constitutional: She is oriented to person, place, and time. She appears well-developed and well-nourished.  Non-toxic appearance.  HENT:  Head:  Normocephalic.  Right Ear: Tympanic membrane and external ear normal.  Left Ear: Tympanic membrane and external ear normal.  There is a tender area to palpation at the right occipital area. No broken skin. No significant hematoma. Negative Battle's sign.  Oropharynx is clear airway is patent  Eyes: EOM and lids  are normal. Pupils are equal, round, and reactive to light.  Neck: Normal range of motion. Neck supple. Carotid bruit is not present.  Cardiovascular: Normal rate, regular rhythm, normal heart sounds, intact distal pulses and normal pulses.   Pulmonary/Chest: Breath sounds normal. No respiratory distress.  There is symmetrical rise and fall of chest. The patient speaks in complete sentences. There is tenderness to the right and left rib area. The family reports that the left rib area is chronically sore. There is no palpable deformity or bruising noted.  Abdominal: Soft. Bowel sounds are normal. There is no tenderness. There is no guarding.  Musculoskeletal: Normal range of motion.  There is good range of motion of right and left upper extremity. No palpable or visual deformity appreciated. There is tenderness at the lower lumbar and coccyx area. No palpable step off noted.  There is good range of motion of right and left hip. No deformity noted of the thigh or lower leg. No temperature changes appreciated of the lower extremities. Dorsalis pedis pulses are 2+ bilaterally.  Lymphadenopathy:       Head (right side): No submandibular adenopathy present.       Head (left side): No submandibular adenopathy present.    She has no cervical adenopathy.  Neurological: She is alert and oriented to person, place, and time. She has normal strength. No cranial nerve deficit or sensory deficit. She exhibits normal muscle tone.  Motor strength of the upper and lower extremities is symmetrical.  Skin: Skin is warm and dry.  Psychiatric: She has a normal mood and affect. Her speech is normal.  Nursing  note and vitals reviewed.   ED Course  Procedures (including critical care time) Labs Review Labs Reviewed - No data to display  Imaging Review No results found. I have personally reviewed and evaluated these images and lab results as part of my medical decision-making.   EKG Interpretation None      MDM  He x-ray of the sacrum/coccyx is negative for fracture or dislocation. X-ray of the pelvis is negative for fracture. X-ray of the chest reveals chronic objective pulmonary disease, but no other acute abnormality and no noted bony abnormality. CT scan of the head shows no acute intracranial abnormality CT scan of the cervical spine also shows no acute abnormality. There is noted on the head CT a remote right MCA territory infarct which is noted to be old.  Discussed the findings with the family. The family states that the patient is at her baseline at this time. Questions were answered. Feel that it is safe for the patient to be discharged home with close outpatient follow-up.    Final diagnoses:  None    **I have reviewed nursing notes, vital signs, and all appropriate lab and imaging results for this patient.Lily Kocher, PA-C 09/13/15 [place], DO 09/14/15 [phone]

## 2015-09-12 ENCOUNTER — Ambulatory Visit (HOSPITAL_COMMUNITY): Payer: Medicare Other | Admitting: Physical Therapy

## 2015-09-12 ENCOUNTER — Telehealth (HOSPITAL_COMMUNITY): Payer: Self-pay

## 2015-09-12 NOTE — Discharge Instructions (Signed)
The CT scan of the head and neck are negative for acute problems. The x-ray of the coccyx, pelvis, and chest are also negative for acute problem. The examination favors bruises and contusion of multiple sites following a bruise. Please continue your current pain management. Please see Dr. Wolfgang Phoenix, or return to the emergency department if any changes, problems, or concerns.

## 2015-09-12 NOTE — Telephone Encounter (Signed)
09/12/15 message was left to cx appt today and to reschedule

## 2015-09-13 ENCOUNTER — Ambulatory Visit (HOSPITAL_COMMUNITY): Payer: Medicare Other

## 2015-09-13 ENCOUNTER — Encounter (HOSPITAL_COMMUNITY): Payer: Medicare Other

## 2015-09-14 ENCOUNTER — Ambulatory Visit (HOSPITAL_COMMUNITY): Payer: Medicare Other

## 2015-09-15 ENCOUNTER — Encounter: Payer: Self-pay | Admitting: Family Medicine

## 2015-09-15 ENCOUNTER — Ambulatory Visit (INDEPENDENT_AMBULATORY_CARE_PROVIDER_SITE_OTHER): Payer: Medicare Other | Admitting: Family Medicine

## 2015-09-15 ENCOUNTER — Ambulatory Visit (HOSPITAL_COMMUNITY): Payer: Medicare Other | Admitting: Physical Therapy

## 2015-09-15 VITALS — BP 118/70 | Ht 66.0 in | Wt 100.2 lb

## 2015-09-15 DIAGNOSIS — F09 Unspecified mental disorder due to known physiological condition: Secondary | ICD-10-CM | POA: Diagnosis not present

## 2015-09-15 DIAGNOSIS — R2689 Other abnormalities of gait and mobility: Secondary | ICD-10-CM

## 2015-09-15 DIAGNOSIS — R293 Abnormal posture: Secondary | ICD-10-CM

## 2015-09-15 DIAGNOSIS — I63511 Cerebral infarction due to unspecified occlusion or stenosis of right middle cerebral artery: Secondary | ICD-10-CM

## 2015-09-15 DIAGNOSIS — M6281 Muscle weakness (generalized): Secondary | ICD-10-CM

## 2015-09-15 DIAGNOSIS — Z9181 History of falling: Secondary | ICD-10-CM

## 2015-09-15 NOTE — Progress Notes (Signed)
   Subjective:    Patient ID: Samantha Hudson, female    DOB: 22-Sep-1934, 81 y.o.   MRN: [id number]  HPIDisoriented.   Fell last Sunday. Wants places on right arm checked. WEnt to ED after fall.  The husband is been concerned about the patient he states that at times she doesn't seem to be thinking as good as she should other times she seems to get somewhat off in her processing no true disorientation. No fever no chills no headaches. He feels that she's been getting worse since been started on seizure medications. Relates that she at times would have a hard time thinking things through. She recently had a fall she injured her arm she had skin tears on the arm she had multiple x-rays including a CAT scan did not show any hematomas or did not show any strokes. PMH seizure disorder previous stroke   Review of Systems Patient denies any fever chills headaches nausea vomiting diarrhea.    Objective:   Physical Exam  Patient is oriented to self time and place Patient is slow in her speaking but overall makes good sense. No obvious disorientation. Lungs clear heart regular skin tear noted on the right arm. Patient has poor balance but uses a walker to get around      Assessment & Plan:  Recent contusion she should heal from this Skin tear I recommend cleaning daily and place Neosporin on it Confusional aspects I would recommend we'll recheck her in 4 weeks patient past many cognitive skills testing her ability to recall items 3 for 3 after distraction 2 for 3+ patient was able to draw a clock without difficulties. Patient will follow-up with [place] in 4 weeks. Patient to follow-up with neurology in July. I'm not really sure what else can be done regarding her medications. Potentially neurologist might be able to adjust medicine. It is possible that some of this processing issue is just related to age Patient is on multiple medications that can cause some mental health issues and processing issues

## 2015-09-15 NOTE — Therapy (Signed)
[place], [place], [address] Phone: [phone]   Fax:  [phone]  Physical Therapy Treatment  Patient Details  Name: Samantha Hudson MRN: [id number] Date of Birth: 12-04-1934 Referring Provider: Marcial Pacas   Encounter Date: 09/15/2015      PT End of Session - 09/15/15 1813    Visit Number 12   Number of Visits 18   Date for PT Re-Evaluation 10/04/15   Authorization Type Medicare and BCBS (G-code done 10th session)   Authorization Time Period 1/95/09 to 08/13/69; recert done 2/45    Authorization - Visit Number 12   Authorization - Number of Visits 20   PT Start Time 8099   PT Stop Time 1550  husband wanted short session so they could get to MD    PT Time Calculation (min) 34 min   Equipment Utilized During Treatment Gait belt   Activity Tolerance Patient tolerated treatment well   Behavior During Therapy [facility] for tasks assessed/performed      Past Medical History  Diagnosis Date  . Hyperlipemia   . IBS (irritable bowel syndrome)   . Osteoporosis   . Fibromyalgia   . Glaucoma     HAD LASER SURGERY - NO EYE DROPS REQUIRED  . Globus hystericus   . Fibromyalgia   . Meningioma ([place])   . CAD (coronary artery disease)     HEART STENT PLACED ABOUT 2000- NO LONGER SEES CARDIOLOGIST; NO C/O OF CHEST PAINS OR SOB  . Heart murmur     MVP SINCE THE 60'S - TAKES ATENOLOL -  . Anxiety   . Swelling     BILATERAL FEET AND LEGS - ONSET OF SWELLING APRIL 2014  . GERD (gastroesophageal reflux disease)   . Arthritis   . Fracture   . CVA (cerebral vascular accident) ([place])     STROKE 7 [place] TO REMOVE BENIGN MENINGIOMA - HAS LEFT SIDED WEAKNESS AND A LITTLE NUMBNESS  . Pain     "EXCRUCIATING PAIN" LEFT HIP - HAS A FRACTURE - IS CONFINED TO W/C AT HOME - NO WEIGHT BEARING LEFT HIP.  Marland Kitchen Parkinson's disease [facility])     Past Surgical History  Procedure Laterality Date  . Brain surgery       tumor removed  . Tonsillectomy    . Partial hysterectomy    . Glaucoma repair    . Cataract extraction    . Kidney surgery      left - HX ENLARGED LEFT KIDNEY ON XRAY - SURGERY WAS DONE ON URETER   . Colonoscopy    . Coronary angioplasty    . Hip arthroplasty Left 12/29/2012    Procedure: LEFT HIP HEMI ARTHROPLASTY ;  Surgeon: Mauri Pole, MD;  Location: [facility];  Service: Orthopedics;  Laterality: Left;    There were no vitals filed for this visit.      Subjective Assessment - 09/15/15 1554    Subjective Patient arrives today, seeming somewhat confused; patietn's husband informs PT that she has had a couple of bad days recently with confusion and general unsteadiness    Pertinent History CAD, hx of CVA, PD, osteoporosis, L hip replacement, hx of possible seizure, fibromyalgia    Currently in Pain? No/denies            [facility] PT Assessment - 09/15/15 0001    Observation/Other Assessments   Observations BP 122/72, pulse 108  [facility] PT Treatment/Exercise - 09/15/15 0001    Ambulation/Gait   Gait Comments 3x240f with no device, cues for safety and close Min guard            PWR ([facility] - 09/15/15 1557    PWR! Up 1x10   Comments sit to stand with cone, cues for form    PWR! Up 1x10   PWR! Rock 1x10   PWR! Twist 1x10   PWR! Step 1x10   Comments cues for form and safety              PT Education - 09/15/15 1813    Education provided No          PT Short Term Goals - 09/06/15 1423    PT SHORT TERM GOAL #1   Title Patient will be able to maintain correct posture at least 70% of the time during all functional situations and scenarios in order to improve overall mobility and reduce fall risk    Time 4   Period Weeks   Status On-going   PT SHORT TERM GOAL #2   Title Patient will be able to complete bed mobility with mod(I), correct sequencnig in order to enhance safety in mobility at home    Time 4   Period Weeks    Status Achieved   PT SHORT TERM GOAL #3   Title Patient will be able to ambulate at least 8022fwith LRAD, no rest breaks and no unsteadiness, in order to demonstrate improved mobility with reduced fall risk    Baseline 4/18- 70864fith no device    Time 4   Period Weeks   Status On-going   PT SHORT TERM GOAL #4   Title Patient will score at least 48 on BERG balance test in order to demonstrate reduced unsteadiness and fall risk    Baseline 4/18- 47   PT SHORT TERM GOAL #5   Title Patient will consistently and correctly perform appropriate HEP, to be updated PRN    Baseline 4/18- reports she has not been doing them very often lately            PT Long Term Goals - 09/06/15 1427    PT LONG TERM GOAL #1   Title Patient to demonstrate strength 4/5 in all tested muscle groups in order to improve functional stability and mobility, reduce fall risk    Baseline 4/18- DNT today    Time 8   Period Weeks   PT LONG TERM GOAL #2   Title Patient to report no falls, LOB, or close calls with falls within the past 4 weeks in order to demonstrate improved functional and improved safety at home and in community    Baseline 4/18- had some close calls    Time 8   Period Weeks   Status Partially Met   PT LONG TERM GOAL #3   Title Patient to be able to ambulate at least 1200f27fring 6MWT with LRAD, no rest breaks and no unsteadiness, in order to demosntrate improved general function and readiness to perform community based tasks    Time 8   Period Weeks   Status On-going   PT LONG TERM GOAL #4   Title Patient to score at least 52 on BERG balance test in order to demonstrate minimal fall risk during mobility    Time 8   Period Weeks   Status On-going   PT LONG TERM GOAL #5   Title Patient to be performing at  least 30 minutes of exercise, at least 5 days per week, in order to maintain functional gains and to promote improved health habits    Time 8   Period Weeks   Status On-going                Plan - 09/15/15 1559    Clinical Impression Statement Patient arrives appearing somewhat confused and very unsafe even with rollator today, required mod cues for safety which is not her norm. Husband and care giver advise that she has had "a rough couple of days", and that she has just been confused recently, want to go to MD after PT. Checked patient's vitals and found them to be at 122/72 and HR 108; aptient fro the most part able to hold conversation however did seem confused regarding sequencing of events, able to follow cues and commands today. Perofrmed gait and basic PWR moves before ending session early today per family/caregiver request; husband advised at end of session that he had spoken to patient's MD and they want her to go to hospital to get checked out.    Rehab Potential Excellent   Clinical Impairments Affecting Rehab Potential stress fracture L LE    PT Frequency 2x / week   PT Duration 4 weeks   PT Treatment/Interventions ADLs/Self Care Home Management;DME Instruction;Gait training;Stair training;Functional mobility training;Therapeutic activities;Therapeutic exercise;Balance training;Neuromuscular re-education;Patient/family education;Manual techniques;Energy conservation;Taping   PT Next Visit Plan Contineu with current POC with focus on PWR program. Get RPE at end of session. Increase focus on general hip strengthening to attempt to reduce L hip pain.    PT Home Exercise Plan Reviewed with no updates made this visit    Consulted and Agree with Plan of Care Patient      Patient will benefit from skilled therapeutic intervention in order to improve the following deficits and impairments:  Abnormal gait, Decreased strength, Decreased balance, Decreased mobility, Difficulty walking, Decreased coordination, Postural dysfunction, Impaired flexibility  Visit Diagnosis: Muscle weakness (generalized)  Other abnormalities of gait and mobility  History of  falling  Abnormal posture     Problem List Patient Active Problem List   Diagnosis Date Noted  . CAD (coronary artery disease) 08/14/2015  . Chest pain 08/14/2015  . Constipation 08/08/2015  . Seizures ([place]) 06/14/2015  . Degenerative arthritis of thumb 03/03/2015  . Abnormality of gait 08/12/2014  . Parkinsonism ([place]) 08/12/2014  . Stroke ([place]) 08/12/2014  . Muscle weakness (generalized) 02/22/2014  . Stiffness of left knee 02/22/2014  . Hamstring tightness of left lower extremity 02/22/2014  . Pain in joint, pelvic region and thigh 02/22/2014  . Difficulty walking 04/07/2013  . Osteoporosis 03/10/2013  . Anemia 03/10/2013  . Mild malnutrition ([place]) 03/10/2013  . S/P left hip hemiarthroplasty 12/30/2012  . Osteoporosis with pathological fracture with delayed healing 11/25/2012  . Pedal edema 10/27/2012  . Fracture of sacrum ([place]) 10/02/2012  . Stricture and stenosis of esophagus 09/24/2011  . Hyperlipidemia 11/03/2008  . CAD, UNSPECIFIED SITE 11/03/2008    Deniece Ree PT, DPT [id number]  [facility] [address] [place], [place], [address] Phone: [phone]   Fax:  [phone]  Name: Samantha Hudson MRN: [id number] Date of Birth: 1935-02-12

## 2015-09-16 ENCOUNTER — Emergency Department (HOSPITAL_COMMUNITY): Payer: Medicare Other

## 2015-09-16 ENCOUNTER — Inpatient Hospital Stay (HOSPITAL_COMMUNITY)
Admission: EM | Admit: 2015-09-16 | Discharge: 2015-09-18 | DRG: 071 | Disposition: A | Discharge status: Home / Self Care | Payer: Medicare Other | Attending: Internal Medicine | Admitting: Internal Medicine

## 2015-09-16 ENCOUNTER — Encounter (HOSPITAL_COMMUNITY): Payer: Self-pay

## 2015-09-16 ENCOUNTER — Ambulatory Visit: Payer: Medicare Other | Admitting: Family Medicine

## 2015-09-16 DIAGNOSIS — E860 Dehydration: Secondary | ICD-10-CM | POA: Diagnosis present

## 2015-09-16 DIAGNOSIS — Z833 Family history of diabetes mellitus: Secondary | ICD-10-CM

## 2015-09-16 DIAGNOSIS — N179 Acute kidney failure, unspecified: Secondary | ICD-10-CM | POA: Diagnosis present

## 2015-09-16 DIAGNOSIS — E785 Hyperlipidemia, unspecified: Secondary | ICD-10-CM | POA: Diagnosis present

## 2015-09-16 DIAGNOSIS — G20 Parkinson's disease: Secondary | ICD-10-CM | POA: Diagnosis present

## 2015-09-16 DIAGNOSIS — M810 Age-related osteoporosis without current pathological fracture: Secondary | ICD-10-CM | POA: Diagnosis present

## 2015-09-16 DIAGNOSIS — F419 Anxiety disorder, unspecified: Secondary | ICD-10-CM | POA: Diagnosis present

## 2015-09-16 DIAGNOSIS — Z803 Family history of malignant neoplasm of breast: Secondary | ICD-10-CM | POA: Diagnosis not present

## 2015-09-16 DIAGNOSIS — R7989 Other specified abnormal findings of blood chemistry: Secondary | ICD-10-CM

## 2015-09-16 DIAGNOSIS — I2510 Atherosclerotic heart disease of native coronary artery without angina pectoris: Secondary | ICD-10-CM | POA: Diagnosis present

## 2015-09-16 DIAGNOSIS — K219 Gastro-esophageal reflux disease without esophagitis: Secondary | ICD-10-CM | POA: Diagnosis present

## 2015-09-16 DIAGNOSIS — M797 Fibromyalgia: Secondary | ICD-10-CM | POA: Diagnosis present

## 2015-09-16 DIAGNOSIS — I10 Essential (primary) hypertension: Secondary | ICD-10-CM | POA: Diagnosis present

## 2015-09-16 DIAGNOSIS — D649 Anemia, unspecified: Secondary | ICD-10-CM | POA: Diagnosis present

## 2015-09-16 DIAGNOSIS — H409 Unspecified glaucoma: Secondary | ICD-10-CM | POA: Diagnosis present

## 2015-09-16 DIAGNOSIS — Z86011 Personal history of benign neoplasm of the brain: Secondary | ICD-10-CM

## 2015-09-16 DIAGNOSIS — G9340 Encephalopathy, unspecified: Secondary | ICD-10-CM | POA: Diagnosis present

## 2015-09-16 DIAGNOSIS — Z955 Presence of coronary angioplasty implant and graft: Secondary | ICD-10-CM | POA: Diagnosis not present

## 2015-09-16 DIAGNOSIS — R410 Disorientation, unspecified: Secondary | ICD-10-CM | POA: Diagnosis present

## 2015-09-16 DIAGNOSIS — F22 Delusional disorders: Secondary | ICD-10-CM | POA: Diagnosis present

## 2015-09-16 DIAGNOSIS — G20A1 Parkinson's disease without dyskinesia, without mention of fluctuations: Secondary | ICD-10-CM | POA: Diagnosis present

## 2015-09-16 DIAGNOSIS — Z8673 Personal history of transient ischemic attack (TIA), and cerebral infarction without residual deficits: Secondary | ICD-10-CM | POA: Diagnosis not present

## 2015-09-16 DIAGNOSIS — Z96642 Presence of left artificial hip joint: Secondary | ICD-10-CM | POA: Diagnosis present

## 2015-09-16 DIAGNOSIS — Z8249 Family history of ischemic heart disease and other diseases of the circulatory system: Secondary | ICD-10-CM | POA: Diagnosis not present

## 2015-09-16 DIAGNOSIS — R4182 Altered mental status, unspecified: Secondary | ICD-10-CM | POA: Insufficient documentation

## 2015-09-16 LAB — URINE MICROSCOPIC-ADD ON: RBC / HPF: NONE SEEN RBC/hpf (ref 0–5)

## 2015-09-16 LAB — URINALYSIS, ROUTINE W REFLEX MICROSCOPIC
BILIRUBIN URINE: NEGATIVE
GLUCOSE, UA: NEGATIVE mg/dL
Hgb urine dipstick: NEGATIVE
LEUKOCYTES UA: NEGATIVE
NITRITE: NEGATIVE
PROTEIN: 30 mg/dL — AB
Specific Gravity, Urine: 1.025 (ref 1.005–1.030)
pH: 5.5 (ref 5.0–8.0)

## 2015-09-16 LAB — CBC WITH DIFFERENTIAL/PLATELET
BASOS ABS: 0 10*3/uL (ref 0.0–0.1)
BASOS PCT: 0 %
EOS ABS: 0 10*3/uL (ref 0.0–0.7)
EOS PCT: 1 %
HCT: 34.9 % — ABNORMAL LOW (ref 36.0–46.0)
Hemoglobin: 11.2 g/dL — ABNORMAL LOW (ref 12.0–15.0)
Lymphocytes Relative: 19 %
Lymphs Abs: 1.2 10*3/uL (ref 0.7–4.0)
MCH: 30.3 pg (ref 26.0–34.0)
MCHC: 32.1 g/dL (ref 30.0–36.0)
MCV: 94.3 fL (ref 78.0–100.0)
MONO ABS: 0.7 10*3/uL (ref 0.1–1.0)
Monocytes Relative: 10 %
NEUTROS ABS: 4.6 10*3/uL (ref 1.7–7.7)
Neutrophils Relative %: 70 %
PLATELETS: 237 10*3/uL (ref 150–400)
RBC: 3.7 MIL/uL — ABNORMAL LOW (ref 3.87–5.11)
RDW: 13.6 % (ref 11.5–15.5)
WBC: 6.6 10*3/uL (ref 4.0–10.5)

## 2015-09-16 LAB — BASIC METABOLIC PANEL
ANION GAP: 8 (ref 5–15)
BUN: 37 mg/dL — ABNORMAL HIGH (ref 6–20)
CALCIUM: 10 mg/dL (ref 8.9–10.3)
CO2: 26 mmol/L (ref 22–32)
CREATININE: 1.43 mg/dL — AB (ref 0.44–1.00)
Chloride: 104 mmol/L (ref 101–111)
GFR, EST AFRICAN AMERICAN: 40 mL/min — AB (ref >60)
GFR, EST NON AFRICAN AMERICAN: 34 mL/min — AB (ref >60)
Glucose, Bld: 97 mg/dL (ref 65–99)
Potassium: 4 mmol/L (ref 3.5–5.1)
SODIUM: 138 mmol/L (ref 135–145)

## 2015-09-16 MED ORDER — ATORVASTATIN CALCIUM 10 MG PO TABS
10.0000 mg | ORAL_TABLET | Freq: Every day | ORAL | Status: DC
  Administered 2015-09-17: 10 mg via ORAL
  Filled 2015-09-16 (×4): qty 1

## 2015-09-16 MED ORDER — SODIUM CHLORIDE 0.9 % IV BOLUS (SEPSIS)
1000.0000 mL | Freq: Once | INTRAVENOUS | Status: AC
  Administered 2015-09-16: 1000 mL via INTRAVENOUS

## 2015-09-16 MED ORDER — LORAZEPAM 2 MG/ML IJ SOLN
1.0000 mg | Freq: Once | INTRAMUSCULAR | Status: AC
  Administered 2015-09-16: 1 mg via INTRAVENOUS
  Filled 2015-09-16: qty 1

## 2015-09-16 MED ORDER — LAMOTRIGINE ER 200 MG PO TB24
200.0000 mg | ORAL_TABLET | Freq: Every day | ORAL | Status: DC
  Administered 2015-09-17: 200 mg via ORAL
  Filled 2015-09-16 (×3): qty 1

## 2015-09-16 MED ORDER — ATENOLOL 25 MG PO TABS
12.5000 mg | ORAL_TABLET | Freq: Two times a day (BID) | ORAL | Status: DC
  Administered 2015-09-17 – 2015-09-18 (×3): 12.5 mg via ORAL
  Filled 2015-09-16 (×4): qty 1

## 2015-09-16 MED ORDER — GABAPENTIN 400 MG PO CAPS
400.0000 mg | ORAL_CAPSULE | Freq: Every day | ORAL | Status: DC
  Administered 2015-09-17: 400 mg via ORAL
  Filled 2015-09-16 (×2): qty 1

## 2015-09-16 NOTE — ED Notes (Signed)
IVC paperwork completed, and sent to Magistrate.  Also called them to confirm.

## 2015-09-16 NOTE — ED Notes (Signed)
Patient was informed by family that she needed to be admitted for further evaluation, patient attempted to leave standing up, not listening to husband and daughter about staying, given order by EDP Vanita Panda for Ativan 1mg  for anxiety.  Husband has initiated paperwork for IVC.  Vital signs stable, patient resting in bed, family at bedside.
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
## 2015-09-16 NOTE — H&P (Signed)
History and Physical    Samantha Hudson [id number] DOB: Oct 16, 1934 DOA: 09/16/2015  Referring MD/NP/PA: Orvan Falconer, MD. PCP: Sallee Lange, MD  Outpatient Specialists: none Patient coming from: home  Chief Complaint: Disorientation, intermittent with paranoia.   HPI: Samantha Hudson is a 81 y.o. female with medical history significant of HLD, IBS, CAD, GERD, CVA, and anxiety who presents to the ED with intermittent disorientation. Her family members report that over the past few days, the patient has been experiencing persistent, increasing disorientation, change in behavior, and lack of cooperation following a fall that she sustained a week ago. She was recently started on Keppra after biting her cheek about 6 months ago after possible Sz, but was switched to Lamotrigine. There has been no further report of seizure activities. Her family reports that her symptoms were were gradually onset until her fall on Monday at which time her symptoms became more pronounced. Then, by 2022-09-07 evening, the severity of her symptoms dramatically increased and she began to exhibit paranoia, irritability, and confusion. While in the ED, she appears much improved. She is orientated and calm, but is still quite paranoia.  She didn't want to stay, and after discussing with her family, in fear she may harm herself, IVC was initiated.   Additionally, her husband recently sold his pharmacy that he had owned for 51 years. It is possible that this event could have also impacted her recent AMS due to stress.   There has been no other changes in medication, nor new medication added.    ED Course: While in the ED, workup showed her BUN and creatinine were elevated. WBC was wnl. UA was positive for bacteria, ketones, and protein, but was otherwise unremarkable. A CT head was performed and showed no acute intracranial abnormalities. There was extensive encephalomalacia/gliosis in the right MCA territory that was related to a  remote infarct, but this is unchanged from her prior examination. She is s/p right pterional craniotomy and has an intracranial clamp present.   Review of Systems: As per HPI otherwise 10 point review of systems negative.   Past Medical History  Diagnosis Date  . Hyperlipemia   . IBS (irritable bowel syndrome)   . Osteoporosis   . Fibromyalgia   . Glaucoma     HAD LASER SURGERY - NO EYE DROPS REQUIRED  . Globus hystericus   . Fibromyalgia   . Meningioma ([place])   . CAD (coronary artery disease)     HEART STENT PLACED ABOUT 2000- NO LONGER SEES CARDIOLOGIST; NO C/O OF CHEST PAINS OR SOB  . Heart murmur     MVP SINCE THE 60'S - TAKES ATENOLOL -  . Anxiety   . Swelling     BILATERAL FEET AND LEGS - ONSET OF SWELLING APRIL 2014  . GERD (gastroesophageal reflux disease)   . Arthritis   . Fracture   . CVA (cerebral vascular accident) ([place])     STROKE 7 [place] TO REMOVE BENIGN MENINGIOMA - HAS LEFT SIDED WEAKNESS AND A LITTLE NUMBNESS  . Pain     "EXCRUCIATING PAIN" LEFT HIP - HAS A FRACTURE - IS CONFINED TO W/C AT HOME - NO WEIGHT BEARING LEFT HIP.  Marland Kitchen Parkinson's disease [facility])     Past Surgical History  Procedure Laterality Date  . Brain surgery      tumor removed  . Tonsillectomy    . Partial hysterectomy    . Glaucoma repair    . Cataract  extraction    . Kidney surgery      left - HX ENLARGED LEFT KIDNEY ON XRAY - SURGERY WAS DONE ON URETER   . Colonoscopy    . Coronary angioplasty    . Hip arthroplasty Left 12/29/2012    Procedure: LEFT HIP HEMI ARTHROPLASTY ;  Surgeon: Mauri Pole, MD;  Location: [facility];  Service: Orthopedics;  Laterality: Left;     reports that she has never smoked. She has never used smokeless tobacco. She reports that she does not drink alcohol or use illicit drugs.  Allergies  Allergen Reactions  . Lactose Intolerance (Gi)   . Penicillins Rash    Has patient had a PCN reaction causing immediate rash,  facial/tongue/throat swelling, SOB or lightheadedness with hypotension: NO Has patient had a PCN reaction causing severe rash involving mucus membranes or skin necrosis: NO Has patient had a PCN reaction that required hospitalization: NO Has patient had a PCN reaction occurring within the last 10 years: NO If all of the above answers are "NO", then may proceed with Cephalosporin use. Pt can tolerate cephalosporins    Family History  Problem Relation Age of Onset  . Liver disease Father   . Diabetes Father   . Coronary artery disease Mother     deseased  . Heart attack Mother   . Hyperlipidemia Mother   . Breast cancer Sister     x 2  . Cancer Sister     breast    Prior to Admission medications   Medication Sig Start Date End Date Taking? Authorizing Provider  ALPRAZolam (XANAX) 0.25 MG tablet Take 1 tablet (0.25 mg total) by mouth at bedtime as needed for anxiety. 08/17/15  Yes Marcial Pacas, MD  atenolol (TENORMIN) 25 MG tablet Take 12.5 mg by mouth 2 (two) times daily.   Yes Historical Provider, MD  atorvastatin (LIPITOR) 10 MG tablet TAKE 1 TABLET AT BEDTIME. 06/23/15  Yes Kathyrn Drown, MD  calcium-vitamin D (OSCAL WITH D) 500-200 MG-UNIT per tablet Take 1 tablet by mouth 3 (three) times daily.    Yes Historical Provider, MD  carbidopa-levodopa (SINEMET IR) 25-100 MG tablet TAKE (2) TABLETS THREE TIMES DAILY. 07/18/15  Yes Marcial Pacas, MD  cholecalciferol (VITAMIN D) 400 UNITS TABS tablet Take 400 Units by mouth daily.   Yes Historical Provider, MD  dipyridamole-aspirin (AGGRENOX) 200-25 MG 12hr capsule TAKE 1 CAPSULE TWICE A DAY 05/18/15  Yes Kathyrn Drown, MD  DULoxetine (CYMBALTA) 30 MG capsule TAKE (1) CAPSULE DAILY. 08/22/15  Yes Kathyrn Drown, MD  gabapentin (NEURONTIN) 100 MG capsule TAKE 2 CAPSULE IN THE MORNING AND 1 CAPSULE IN THE AFTERNNON 06/23/15  Yes Kathyrn Drown, MD  gabapentin (NEURONTIN) 400 MG capsule Take 400 mg by mouth at bedtime.   Yes Historical Provider, MD    LamoTRIgine XR 200 MG TB24 Take 1 tablet (200 mg total) by mouth at bedtime. Patient taking differently: Take 200 mg by mouth at bedtime. Will start 200 mg on Wednesday and stop Keppra 08/17/15  Yes Marcial Pacas, MD  polyethylene glycol powder (GLYCOLAX/MIRALAX) powder Take 17 g by mouth daily as needed for mild constipation or moderate constipation.   Yes Historical Provider, MD  potassium chloride (K-DUR) 10 MEQ tablet TAKE (1) TABLET TWICE A DAY. 06/23/15  Yes Kathyrn Drown, MD  ranitidine (ZANTAC) 300 MG tablet TAKE 1 TABLET DAILY AS DIRECTED Patient taking differently: TAKE 1 TABLET DAILY 06/23/15  Yes Kathyrn Drown, MD  torsemide (  DEMADEX) 20 MG tablet TAKE 1/2 TABLET EACH MORNING. Patient taking differently: TAKE 1/2 TABLET EACH MORNING AS NEEDED FOR FLUID 09/07/15  Yes Kathyrn Drown, MD  traMADol (ULTRAM) 50 MG tablet Take 1 tablet (50 mg total) by mouth every 6 (six) hours as needed for moderate pain. 04/21/15  Yes Kathyrn Drown, MD  zoledronic acid (RECLAST) 5 MG/100ML SOLN injection Inject 5 mg into the vein once. Takes yearly in December   Yes Historical Provider, MD  hydroxypropyl methylcellulose / hypromellose (ISOPTO TEARS / GONIOVISC) 2.5 % ophthalmic solution Place 1 drop into both eyes as needed for dry eyes. Reported on 06/20/2015    Historical Provider, MD  polyethylene glycol powder (GLYCOLAX/MIRALAX) powder Take 17 g by mouth 2 (two) times daily. Patient taking differently: Take 17 g by mouth daily as needed for mild constipation.  01/01/13   Danae Orleans, PA-C    Physical Exam: Filed Vitals:   09/16/15 1504 09/16/15 1851  BP: 118/42 126/56  Pulse: 84 78  Temp: 98.1 F (36.7 C)   TempSrc: Oral   Resp: 18 18  Height: 5\' 4"  (1.626 m)   Weight: 45.36 kg (100 lb)   SpO2: 100% 100%      Constitutional: NAD, calm, comfortable Filed Vitals:   09/16/15 1504 09/16/15 1851  BP: 118/42 126/56  Pulse: 84 78  Temp: 98.1 F (36.7 C)   TempSrc: Oral   Resp: 18 18  Height:  5\' 4"  (1.626 m)   Weight: 45.36 kg (100 lb)   SpO2: 100% 100%   Eyes: PERRL, lids and conjunctivae normal ENMT: Mucous membranes are moist. Posterior pharynx clear of any exudate or lesions.Normal dentition.  Neck: normal, supple, no masses, no thyromegaly Respiratory: clear to auscultation bilaterally, no wheezing, no crackles. Normal respiratory effort. No accessory muscle use.  Cardiovascular: Regular rate and rhythm, no murmurs / rubs / gallops. No extremity edema. 2+ pedal pulses. No carotid bruits.  Abdomen: no tenderness, no masses palpated. No hepatosplenomegaly. Bowel sounds positive.  Musculoskeletal: no clubbing / cyanosis. No joint deformity upper and lower extremities. Good ROM, no contractures. Normal muscle tone.  Skin: no rashes, lesions, ulcers. No induration Neurologic: CN 2-12 grossly intact. Sensation intact, DTR normal. Strength 5/5 in all 4. No cogwheel rigidity Psychiatric:  Paranoia ideation and intermittent slight confusion.  Labs on Admission: I have personally reviewed following labs and imaging studies  CBC:  Recent Labs Lab 09/16/15 1623  WBC 6.6  NEUTROABS 4.6  HGB 11.2*  HCT 34.9*  MCV 94.3  PLT 123XX123   Basic Metabolic Panel:  Recent Labs Lab 09/16/15 1623  NA 138  K 4.0  CL 104  CO2 26  GLUCOSE 97  BUN 37*  CREATININE 1.43*  CALCIUM 10.0   Urine analysis:    Component Value Date/Time   COLORURINE YELLOW 09/16/2015 1620   APPEARANCEUR CLEAR 09/16/2015 1620   LABSPEC 1.025 09/16/2015 1620   PHURINE 5.5 09/16/2015 1620   GLUCOSEU NEGATIVE 09/16/2015 1620   HGBUR NEGATIVE 09/16/2015 1620   BILIRUBINUR NEGATIVE 09/16/2015 1620   BILIRUBINUR ++ 07/27/2015 1618   KETONESUR TRACE* 09/16/2015 1620   PROTEINUR 30* 09/16/2015 1620   PROTEINUR 100 07/27/2015 1618   UROBILINOGEN 0.2 03/02/2015 1620   NITRITE NEGATIVE 09/16/2015 1620   LEUKOCYTESUR NEGATIVE 09/16/2015 1620   Sepsis Labs: @LABRCNTIP (procalcitonin:4,lacticidven:4) )No  results found for this or any previous visit (from the past 240 hour(s)).   Radiological Exams on Admission: Ct Head Wo Contrast  09/16/2015  CLINICAL DATA:  81 year old female with history of recent fall. Confusion for the past 2 days. History of meningioma status post surgical resection. History prior stroke. EXAM: CT HEAD WITHOUT CONTRAST TECHNIQUE: Contiguous axial images were obtained from the base of the skull through the vertex without intravenous contrast. COMPARISON:  Multiple priors, most recently head CT 09/11/2015. FINDINGS: Status post right pterional craniotomy. Large area of low attenuation throughout the right MCA distribution involving right frontal, anterior right parietal and anterior right temporal regions, compatible with encephalomalacia/gliosis at site of prior right MCA territory infarction. This is unchanged compared to the prior study. No acute intracranial abnormalities. Specifically, no evidence of acute intracranial hemorrhage, no definite findings of acute/subacute cerebral ischemia, no mass, mass effect, hydrocephalus or abnormal intra or extra-axial fluid collections. Visualized paranasal sinuses and mastoids are well pneumatized. No acute displaced skull fractures are identified. IMPRESSION: 1. No acute intracranial abnormalities. 2. Extensive encephalomalacia/gliosis in the right MCA territory related to remote infarct, unchanged compared to prior examination. 3. Status post right pterional craniotomy. Electronically Signed   By: Vinnie Langton M.D.   On: 09/16/2015 17:50    EKG: Independently reviewed.   Assessment/Plan Principal Problem:   Altered mental status Active Problems:   Hyperlipidemia   Anxiety   Paranoia ([place])  1. Altered mental status:  Likely a gradual process due to medication or early parkinson's dementia. UA and CT head were unremarkable. Hx of stroke. I don't think she has another CVA, and although she had prior MRI, given it will not change  therapy, and that she has a intracerebral clip, will defer MRI at this time.  It possible she has more side effect with Lamictal, but will continue with it for now.  She likely has early parkinson dementia or AD.  Will obtain HIV, RPR, B12, and consult neurology if available for further recommendation.   Will give IVFs. Switch from aggrenox to full dose asa. Because of the potential demanding to leave, she is being involuntary committed.   Will avoid using any medication unless she is very agitated.  2. HLD. Continue statins. 3. Parkinson's disease. Continue outpatient meds. 4. Anxiety: IWill continue medication.  It may be the fact that her husband sold his pharmacy last week may have precipitated this worsening symptoms.  5. CAD:  Stable.   On Betablocker and now ASA.      DVT prophylaxis: Lovenox.  Code Status: Full Family Communication: Family and daughters present at bedside. Disposition Plan: anticipate discharge home in 1-2 days. Consults called: none Admission status: will admit to inpatient and observe   Orvan Falconer, MD FACP [facility]  If 7PM-7AM, please contact night-coverage [email or web address] Password [email or web address]  09/16/2015, 7:18 PM   By signing my name below, I, Delene Ruffini, attest that this documentation has been prepared under the direction and in the presence of Orvan Falconer, MD. Electronically Signed: Delene Ruffini, Scribe 09/16/2015 7:15pm

## 2015-09-16 NOTE — ED Notes (Signed)
Patient's family wanted night time medication administrated, patient was able to arouse enough to safely give medications.

## 2015-09-17 DIAGNOSIS — F22 Delusional disorders: Secondary | ICD-10-CM

## 2015-09-17 DIAGNOSIS — G9340 Encephalopathy, unspecified: Principal | ICD-10-CM

## 2015-09-17 DIAGNOSIS — N179 Acute kidney failure, unspecified: Secondary | ICD-10-CM | POA: Diagnosis present

## 2015-09-17 DIAGNOSIS — F419 Anxiety disorder, unspecified: Secondary | ICD-10-CM

## 2015-09-17 DIAGNOSIS — G20 Parkinson's disease: Secondary | ICD-10-CM | POA: Diagnosis present

## 2015-09-17 LAB — CBC
HCT: 34.3 % — ABNORMAL LOW (ref 36.0–46.0)
Hemoglobin: 11 g/dL — ABNORMAL LOW (ref 12.0–15.0)
MCH: 30.1 pg (ref 26.0–34.0)
MCHC: 32.1 g/dL (ref 30.0–36.0)
MCV: 94 fL (ref 78.0–100.0)
PLATELETS: 205 10*3/uL (ref 150–400)
RBC: 3.65 MIL/uL — ABNORMAL LOW (ref 3.87–5.11)
RDW: 13.5 % (ref 11.5–15.5)
WBC: 4.2 10*3/uL (ref 4.0–10.5)

## 2015-09-17 LAB — TSH: TSH: 2.851 u[IU]/mL (ref 0.350–4.500)

## 2015-09-17 LAB — CREATININE, SERUM
CREATININE: 0.92 mg/dL (ref 0.44–1.00)
GFR calc Af Amer: >60 mL/min (ref >60)
GFR calc non Af Amer: 58 mL/min — ABNORMAL LOW (ref >60)

## 2015-09-17 LAB — VITAMIN B12: Vitamin B-12: 801 pg/mL (ref 180–914)

## 2015-09-17 MED ORDER — FAMOTIDINE 20 MG PO TABS
10.0000 mg | ORAL_TABLET | Freq: Every day | ORAL | Status: DC
  Administered 2015-09-17 – 2015-09-18 (×2): 10 mg via ORAL
  Filled 2015-09-17 (×2): qty 1

## 2015-09-17 MED ORDER — ALPRAZOLAM 0.25 MG PO TABS
0.2500 mg | ORAL_TABLET | Freq: Every evening | ORAL | Status: DC | PRN
  Filled 2015-09-17: qty 1

## 2015-09-17 MED ORDER — POLYVINYL ALCOHOL 1.4 % OP SOLN: 1.0000 [drp] | OPHTHALMIC | Status: DC | PRN

## 2015-09-17 MED ORDER — SODIUM CHLORIDE 0.9% FLUSH
3.0000 mL | Freq: Two times a day (BID) | INTRAVENOUS | Status: DC
  Administered 2015-09-17 (×3): 3 mL via INTRAVENOUS

## 2015-09-17 MED ORDER — DULOXETINE HCL 30 MG PO CPEP
30.0000 mg | ORAL_CAPSULE | Freq: Every day | ORAL | Status: DC
  Administered 2015-09-17 – 2015-09-18 (×2): 30 mg via ORAL
  Filled 2015-09-17 (×2): qty 1

## 2015-09-17 MED ORDER — POLYETHYLENE GLYCOL 3350 17 GM/SCOOP PO POWD: 17.0000 g | Freq: Every day | ORAL | Status: DC | PRN

## 2015-09-17 MED ORDER — ENOXAPARIN SODIUM 30 MG/0.3ML ~~LOC~~ SOLN
30.0000 mg | SUBCUTANEOUS | Status: DC
Start: 2015-09-17 — End: 2015-09-18
  Administered 2015-09-17: 30 mg via SUBCUTANEOUS
  Filled 2015-09-17 (×2): qty 0.3

## 2015-09-17 MED ORDER — CARBIDOPA-LEVODOPA 25-100 MG PO TABS
2.0000 | ORAL_TABLET | Freq: Three times a day (TID) | ORAL | Status: DC
  Administered 2015-09-17 – 2015-09-18 (×4): 2 via ORAL
  Filled 2015-09-17 (×4): qty 2

## 2015-09-17 MED ORDER — POLYETHYLENE GLYCOL 3350 17 GM/SCOOP PO POWD
17.0000 g | Freq: Every day | ORAL | Status: DC | PRN
  Filled 2015-09-17: qty 255

## 2015-09-17 MED ORDER — GABAPENTIN 100 MG PO CAPS
100.0000 mg | ORAL_CAPSULE | Freq: Two times a day (BID) | ORAL | Status: DC
  Administered 2015-09-17 – 2015-09-18 (×3): 100 mg via ORAL
  Filled 2015-09-17 (×3): qty 1

## 2015-09-17 MED ORDER — POLYETHYLENE GLYCOL 3350 17 G PO PACK: 17.0000 g | PACK | Freq: Every day | ORAL | Status: DC | PRN

## 2015-09-17 MED ORDER — HYPROMELLOSE (GONIOSCOPIC) 2.5 % OP SOLN
1.0000 [drp] | OPHTHALMIC | Status: DC | PRN
  Filled 2015-09-17: qty 15

## 2015-09-17 MED ORDER — ASPIRIN EC 81 MG PO TBEC
81.0000 mg | DELAYED_RELEASE_TABLET | Freq: Every day | ORAL | Status: DC
  Administered 2015-09-17: 81 mg via ORAL
  Filled 2015-09-17: qty 1

## 2015-09-17 MED ORDER — ASPIRIN-DIPYRIDAMOLE ER 25-200 MG PO CP12
1.0000 | ORAL_CAPSULE | Freq: Two times a day (BID) | ORAL | Status: DC
  Administered 2015-09-18: 1 via ORAL
  Filled 2015-09-17 (×3): qty 1

## 2015-09-17 NOTE — Progress Notes (Signed)
PROGRESS NOTE    Samantha Hudson  [id number] DOB: 1935/02/21 DOA: 09/16/2015 PCP: Sallee Lange, MD  Outpatient Specialists: None    Brief Narrative: Patient is a 81 year old woman with a history of CAD, CVA, anxiety, Parkinson's, and possible seizure disorder, who was brought to the hospital by her family for a complaint of disorientation and intermittent paranoia. She had been recently started on Keppra for possible seizure after biting her cheek about 6 months ago. Apparently she did not tolerate it, so it was changed to Lamotrigine. She had a recent fall and appeared to have gotten worse with regards to her mental status since then. In the ED, she was afebrile and hemodynamically stable. She was oxygenating 100% on room air. CT of her head revealed no acute intracranial abnormalities, but with extensive encephalomalacia/Cleocin is in the right MCA territory related to a remote infarct, unchanged; status post right pterional craniotomy. Her lab data were significant for BUN of 37 and creatinine of 1.43. She refused to stay for admission, but after the family feared that she may harm herself, IVC was initiated.  She was admitted for further evaluation and management.   Assessment & Plan:   Principal Problem:   Acute encephalopathy Active Problems:   Paranoia ([place])   Acute kidney injury ([place])   Hyperlipidemia   Coronary atherosclerosis   Anxiety   Parkinson's disease ([place])   1. Acute encephalopathy with paranoia and disorientation. Patient was started on IV fluids for general hydration. Supportive treatment was given. She was restarted on all of her chronic medications. For further evaluation, number studies were ordered. -Her urinalysis revealed no WBCs. Her TSH was within normal limits. RPR, vitamin B12, HIV, and vitamin B1 labs are pending. -Was later decided to hold lamotrigine as it can cause emotional lability impaired concentration and some other central nervous system  effects; this is in addition to other psychotropic medications including Cymbalta, Sinemet, and gabapentin. -Per assessment today, she is oriented, not agitated, and not paranoid.  Chronic anxiety. Patient is treated as needed with alprazolam and Cymbalta. She had a recent stressor regarding her husband selling his pharmacy after more than 5 decades.  -Cymbalta and Xanax was continued when necessary  Parkinson's disease. Treatment was continued with Sinemet.  Acute kidney injury. Patient is BUN was 37 and her creatinine was 1.43 on admission. Her creatinine was within normal limits one month ago. -She was started on gentle IV fluids. Her renal function has improved and normalized back to baseline. Etiology was secondary to prerenal azotemia.  Hypertension/CAD. She is treated chronically with atenolol. It was continued. Patient is also treated chronically with Aggrenox for her history of stroke, but it was held in favor of 81 mg aspirin. She has no complaints of chest pain.  Query seizure disorder. Patient's daughter says that the diagnosis was based on her biting her tongue, but the workup with a CT of her head and EEG in the outpatient setting was essentially negative. -We'll continue gabapentin, but will hold Lamotrigine for now.     DVT prophylaxis: Lovenox Code Status: Full code Family Communication: Discussed with daughter Disposition Plan: Discharge to home with clinical appropriate, likely in the next 24 hours.   Consultants:   None  Procedures:  None  Antimicrobials:   None   Subjective: Patient denies confusion. She has no complaints of headache, chest pain, or shortness of breath.  Objective: Filed Vitals:   09/16/15 2225 09/16/15 2343 09/17/15 0520 09/17/15 1428  BP: 122/89 155/76 144/63  114/46  Pulse: 79 76 63 76  Temp:  98 F (36.7 C) 97.9 F (36.6 C) 97.6 F (36.4 C)  TempSrc:  Oral Axillary Oral  Resp: 16 15 16 16   Height:      Weight:        SpO2: 95% 97% 100% 100%    Intake/Output Summary (Last 24 hours) at 09/17/15 1553 Last data filed at 09/17/15 1300  Gross per 24 hour  Intake    243 ml  Output      5 ml  Net    238 ml   Filed Weights   09/16/15 1504  Weight: 45.36 kg (100 lb)    Examination:  General exam: Appears calm and comfortable; no acute distress.  Respiratory system: Clear to auscultation. Respiratory effort normal. Cardiovascular system: S1 & S2 heard, with a soft systolic murmur. No pedal edema. Gastrointestinal system: Abdomen is nondistended, soft and nontender. No organomegaly or masses felt. Normal bowel sounds heard. Central nervous system: Alert and oriented. Parkinsonian face he's noted. Mild tremor of her right greater than left hands. Cranial nerves II through XII grossly intact. Extremities: No acute hot red joints. Patient able to lift each leg against gravity upon request. Skin: Few abrasions/skin tears on right arm; not bleeding. Psychiatry: Flat affect. Patient agrees to stay in the hospital for testing.     Data Reviewed: I have personally reviewed following labs and imaging studies  CBC:  Recent Labs Lab 09/16/15 1623 09/17/15 0626  WBC 6.6 4.2  NEUTROABS 4.6  --   HGB 11.2* 11.0*  HCT 34.9* 34.3*  MCV 94.3 94.0  PLT 237 [id number]   Basic Metabolic Panel:  Recent Labs Lab 09/16/15 1623 09/17/15 0626  NA 138  --   K 4.0  --   CL 104  --   CO2 26  --   GLUCOSE 97  --   BUN 37*  --   CREATININE 1.43* 0.92  CALCIUM 10.0  --    GFR: Estimated Creatinine Clearance: 36.1 mL/min (by C-G formula based on Cr of 0.92). Liver Function Tests: No results for input(s): AST, ALT, ALKPHOS, BILITOT, PROT, ALBUMIN in the last 168 hours. No results for input(s): LIPASE, AMYLASE in the last 168 hours. No results for input(s): AMMONIA in the last 168 hours. Coagulation Profile: No results for input(s): INR, PROTIME in the last 168 hours. Cardiac Enzymes: No results for input(s):  CKTOTAL, CKMB, CKMBINDEX, TROPONINI in the last 168 hours. BNP (last 3 results) No results for input(s): PROBNP in the last 8760 hours. HbA1C: No results for input(s): HGBA1C in the last 72 hours. CBG: No results for input(s): GLUCAP in the last 168 hours. Lipid Profile: No results for input(s): CHOL, HDL, LDLCALC, TRIG, CHOLHDL, LDLDIRECT in the last 72 hours. Thyroid Function Tests:  Recent Labs  09/17/15 0626  TSH 2.851   Anemia Panel: No results for input(s): VITAMINB12, FOLATE, FERRITIN, TIBC, IRON, RETICCTPCT in the last 72 hours. Urine analysis:    Component Value Date/Time   COLORURINE YELLOW 09/16/2015 1620   APPEARANCEUR CLEAR 09/16/2015 1620   LABSPEC 1.025 09/16/2015 1620   PHURINE 5.5 09/16/2015 1620   GLUCOSEU NEGATIVE 09/16/2015 1620   HGBUR NEGATIVE 09/16/2015 1620   BILIRUBINUR NEGATIVE 09/16/2015 1620   BILIRUBINUR ++ 07/27/2015 1618   KETONESUR TRACE* 09/16/2015 1620   PROTEINUR 30* 09/16/2015 1620   PROTEINUR 100 07/27/2015 1618   UROBILINOGEN 0.2 03/02/2015 1620   NITRITE NEGATIVE 09/16/2015 1620   LEUKOCYTESUR NEGATIVE 09/16/2015 1620  Sepsis Labs: @LABRCNTIP (procalcitonin:4,lacticidven:4)  )No results found for this or any previous visit (from the past 240 hour(s)).       Radiology Studies: Ct Head Wo Contrast  09/16/2015  CLINICAL DATA:  81 year old female with history of recent fall. Confusion for the past 2 days. History of meningioma status post surgical resection. History prior stroke. EXAM: CT HEAD WITHOUT CONTRAST TECHNIQUE: Contiguous axial images were obtained from the base of the skull through the vertex without intravenous contrast. COMPARISON:  Multiple priors, most recently head CT 09/11/2015. FINDINGS: Status post right pterional craniotomy. Large area of low attenuation throughout the right MCA distribution involving right frontal, anterior right parietal and anterior right temporal regions, compatible with encephalomalacia/gliosis  at site of prior right MCA territory infarction. This is unchanged compared to the prior study. No acute intracranial abnormalities. Specifically, no evidence of acute intracranial hemorrhage, no definite findings of acute/subacute cerebral ischemia, no mass, mass effect, hydrocephalus or abnormal intra or extra-axial fluid collections. Visualized paranasal sinuses and mastoids are well pneumatized. No acute displaced skull fractures are identified. IMPRESSION: 1. No acute intracranial abnormalities. 2. Extensive encephalomalacia/gliosis in the right MCA territory related to remote infarct, unchanged compared to prior examination. 3. Status post right pterional craniotomy. Electronically Signed   By: Vinnie Langton M.D.   On: 09/16/2015 17:50        Scheduled Meds: . aspirin EC  81 mg Oral Daily  . atenolol  12.5 mg Oral BID  . atorvastatin  10 mg Oral QHS  . carbidopa-levodopa  2 tablet Oral TID  . DULoxetine  30 mg Oral Daily  . enoxaparin (LOVENOX) injection  30 mg Subcutaneous Q24H  . famotidine  10 mg Oral Daily  . gabapentin  100 mg Oral BID  . gabapentin  400 mg Oral QHS  . LamoTRIgine XR  200 mg Oral QHS  . sodium chloride flush  3 mL Intravenous Q12H   Continuous Infusions:    LOS: 1 day    Time spent: 30 minutes    Rexene Alberts, MD [facility] Pager [phone]  If 7PM-7AM, please contact night-coverage [email or web address] Password [facility] 09/17/2015, 3:53 PM

## 2015-09-18 DIAGNOSIS — R410 Disorientation, unspecified: Secondary | ICD-10-CM | POA: Insufficient documentation

## 2015-09-18 LAB — HIV ANTIBODY (ROUTINE TESTING W REFLEX): HIV Screen 4th Generation wRfx: NONREACTIVE

## 2015-09-18 LAB — RPR: RPR Ser Ql: NONREACTIVE

## 2015-09-18 MED ORDER — QUETIAPINE FUMARATE 25 MG PO TABS: 12.5000 mg | ORAL_TABLET | Freq: Every evening | ORAL | Status: DC | PRN

## 2015-09-18 MED ORDER — LAMOTRIGINE ER 200 MG PO TB24: 200.0000 mg | ORAL_TABLET | Freq: Every day | ORAL | Status: DC

## 2015-09-18 MED ORDER — TORSEMIDE 20 MG PO TABS: ORAL_TABLET | ORAL | Status: DC

## 2015-09-18 NOTE — Progress Notes (Signed)
Discharged to home with spouse.

## 2015-09-18 NOTE — Discharge Summary (Addendum)
Physician Discharge Summary  Samantha Hudson [id number] DOB: [birth year] DOA: 09/16/2015  PCP: Sallee Lange, MD  Admit date: 09/16/2015 Discharge date: 09/18/2015  Time spent: Greater than 30 minutes  Recommendations for Outpatient Follow-up:  1. Recommend a management plan of the patient's psychotropic medications between her PCP and neurologist. Consider outpatient psychiatric evaluation. 2. Involuntary inpatient commitment was canceled as the patient was not felt to be harmful to herself.    Discharge Diagnoses:  1. Acute encephalopathy with paranoia. -Etiology not clinically determined. -IVC rescinded as the patient demonstrated no harm to herself or any suicidal ideations. 2. Acute kidney injury secondary to prerenal azotemia. Resolved. 3. Query seizure disorder. 4. Chronic anxiety. 5. Essential hypertension. 6. Coronary artery disease. 7. Normocytic anemia.   Discharge Condition: Improved.  Diet recommendation: Heart healthy.  Filed Weights   09/16/15 1504  Weight: 45.36 kg (100 lb)    History of present illness:  Patient is a 81 year old woman with a history of CAD, CVA, anxiety, Parkinson's, and possible seizure disorder, who was brought to the hospital by her family for a complaint of disorientation and intermittent paranoia. She had been recently started on Keppra for possible seizure after biting her cheek about 6 months ago. Apparently she did not tolerate it, so it was changed to Lamotrigine. She had a recent fall and appeared to have gotten worse with regards to her mental status since then. In the ED, she was afebrile and hemodynamically stable. She was oxygenating 100% on room air. CT of her head revealed no acute intracranial abnormalities, but with extensive encephalomalacia/Cleocin is in the right MCA territory related to a remote infarct, unchanged; status post right pterional craniotomy. Her lab data were significant for BUN of 37 and creatinine of 1.43. She  refused to stay for admission, but after the family feared that she may harm herself, IVC was initiated. She was admitted for further evaluation and management.  Hospital Course:   1. Acute encephalopathy with paranoia and disorientation. Patient was started on IV fluids for general hydration. Supportive treatment was given. She was restarted on all of her chronic medications. For further evaluation, number studies were ordered. -Her urinalysis revealed no WBCs. Her TSH was within normal limits. RPR was nonreactive. HIV was nonreactive. Vitamin B12 was within normal limits. -Was later decided to hold lamotrigine as it can cause emotional lability and impaired concentration and some other central nervous system effects; this is in addition to her other psychotropic medications including Cymbalta, Sinemet, and gabapentin which could potentially do the same. -During both of my assessments, the patient was alert and oriented 3. She was insightful. She was able to give me a detailed history of the course of Keppra and then lamotrigine and why they were prescribed. She correctly reviewed her history of a possible seizure disorder and her neurologist's explanation for why anticonvulsant medications were prescribed. She also explained to me that gabapentin was prescribed for pain and not for her possible seizure disorder, although she knows that it was initially used as a seizure medication. There was no agitation during the hospitalization, however, she did refuse her a.m. medications on the morning of discharge with some demonstration of paranoia because the medications were not her specific medications from home. The patient was not a serious flight risk as she is somewhat debilitated from her Parkinson's disease. I do not believe involuntary commitment was necessary or appropriate. There is no indication that the patient is purposely harmful to herself. -Although lamotrigine was held temporarily,  it was  decided to restart it at the time of discharge. As stated above, the patient is on a number of psychotropic medications any of which can cause confusion or disorientation. I believe the adjustments of her psychotropic medications and/or titration of these medications will be best managed by her outpatient physician and neurologist.  -A prescription, however, was given to the patient for Seroquel 12.5 mg as needed for agitation or paranoia. This was discussed with her retired Software engineer husband and her daughter.   Chronic anxiety. Patient is treated chronically with  as needed alprazolam and Cymbalta. She had a recent stressor regarding her husband selling his pharmacy after more than 5 decades.  -Cymbalta and Xanax were  continued when necessary  Parkinson's disease. Treatment was continued with Sinemet.  Acute kidney injury. Patient is BUN was 37 and her creatinine was 1.43 on admission. Her creatinine was within normal limits one month ago. -She was started on gentle IV fluids. Her renal function has improved and normalized back to baseline. Etiology was secondary to prerenal azotemia.  Hypertension/CAD. She is treated chronically with atenolol. It was continued. Patient is also treated chronically with Aggrenox for her history of stroke, but it was held in favor of 81 mg aspirin on admission . She has no complaints of chest pain. -Aggrenox was restarted at discharge.  Query seizure disorder. Patient's daughter says that the diagnosis was based on her biting her tongue, but the workup with a CT of her head and EEG in the outpatient setting was essentially negative. -Lamictal was held for one day, but was restarted at the time of discharge. Will defer further management to her neurologist, Dr. Krista Blue or PCP Dr. Wolfgang Phoenix.  Procedures:  None  Consultations:  None  Discharge Exam: Filed Vitals:   09/17/15 2247 09/18/15 0705  BP:  136/66  Pulse:  76  Temp:  98.3 F (36.8 C)  Resp: 16 16   Oxygen saturation 100% on room air.    Discharge Instructions   Discharge Instructions    Diet - low sodium heart healthy    Complete by:  As directed      Discharge instructions    Complete by:  As directed   Please discuss further management of your medications with your primary care physician and neurologist.     Increase activity slowly    Complete by:  As directed           Current Discharge Medication List    START taking these medications   Details  QUEtiapine (SEROQUEL) 25 MG tablet Take 0.5 tablets (12.5 mg total) by mouth at bedtime as needed (AGITATION AND PARANOIA.). Qty: 15 tablet, Refills: 0      CONTINUE these medications which have CHANGED   Details  LamoTRIgine XR 200 MG TB24 Take 1 tablet (200 mg total) by mouth at bedtime. Qty: 90 tablet, Refills: 4    torsemide (DEMADEX) 20 MG tablet TAKE 1/2 TABLET EACH MORNING AS NEEDED FOR FLUID      CONTINUE these medications which have NOT CHANGED   Details  ALPRAZolam (XANAX) 0.25 MG tablet Take 1 tablet (0.25 mg total) by mouth at bedtime as needed for anxiety. Qty: 30 tablet, Refills: 5    atenolol (TENORMIN) 25 MG tablet Take 12.5 mg by mouth 2 (two) times daily.    atorvastatin (LIPITOR) 10 MG tablet TAKE 1 TABLET AT BEDTIME. Qty: 30 tablet, Refills: 5    calcium-vitamin D (OSCAL WITH D) 500-200 MG-UNIT per tablet Take 1  tablet by mouth 3 (three) times daily.     carbidopa-levodopa (SINEMET IR) 25-100 MG tablet TAKE (2) TABLETS THREE TIMES DAILY. Qty: 180 tablet, Refills: 11    cholecalciferol (VITAMIN D) 400 UNITS TABS tablet Take 400 Units by mouth daily.    dipyridamole-aspirin (AGGRENOX) 200-25 MG 12hr capsule TAKE 1 CAPSULE TWICE A DAY Qty: 60 capsule, Refills: 5    DULoxetine (CYMBALTA) 30 MG capsule TAKE (1) CAPSULE DAILY. Qty: 30 capsule, Refills: 5    !! gabapentin (NEURONTIN) 100 MG capsule TAKE 2 CAPSULE IN THE MORNING AND 1 CAPSULE IN THE AFTERNNON Qty: 90 capsule, Refills: 5     !! gabapentin (NEURONTIN) 400 MG capsule Take 400 mg by mouth at bedtime.    !! polyethylene glycol powder (GLYCOLAX/MIRALAX) powder Take 17 g by mouth daily as needed for mild constipation or moderate constipation.    potassium chloride (K-DUR) 10 MEQ tablet TAKE (1) TABLET TWICE A DAY. Qty: 60 tablet, Refills: 5    ranitidine (ZANTAC) 300 MG tablet TAKE 1 TABLET DAILY AS DIRECTED Qty: 30 tablet, Refills: 5    traMADol (ULTRAM) 50 MG tablet Take 1 tablet (50 mg total) by mouth every 6 (six) hours as needed for moderate pain. Qty: 60 tablet, Refills: 3    zoledronic acid (RECLAST) 5 MG/100ML SOLN injection Inject 5 mg into the vein once. Takes yearly in December    hydroxypropyl methylcellulose / hypromellose (ISOPTO TEARS / GONIOVISC) 2.5 % ophthalmic solution Place 1 drop into both eyes as needed for dry eyes. Reported on 06/20/2015    !! polyethylene glycol powder (GLYCOLAX/MIRALAX) powder Take 17 g by mouth 2 (two) times daily. Qty: 255 g, Refills: 0     !! - Potential duplicate medications found. Please discuss with provider.     Allergies  Allergen Reactions  . Lactose Intolerance (Gi)   . Penicillins Rash    Has patient had a PCN reaction causing immediate rash, facial/tongue/throat swelling, SOB or lightheadedness with hypotension: NO Has patient had a PCN reaction causing severe rash involving mucus membranes or skin necrosis: NO Has patient had a PCN reaction that required hospitalization: NO Has patient had a PCN reaction occurring within the last 10 years: NO If all of the above answers are "NO", then may proceed with Cephalosporin use. Pt can tolerate cephalosporins   Follow-up Information    Follow up with Sallee Lange, MD.   Specialty:  Family Medicine   Why:  For hospital follow-up in 3-5 days.   Contact information:   [address] [place] [address] [phone]       Follow up with Marcial Pacas, MD.   Specialty:  Neurology   Why:  Follow-up  to discuss further testing and medication management.   Contact information:   [address] [place] [address] [phone]        The results of significant diagnostics from this hospitalization (including imaging, microbiology, ancillary and laboratory) are listed below for reference.    Significant Diagnostic Studies: Dg Chest 1 View  09/12/2015  CLINICAL DATA:  Status post fall onto kitchen floor, with left hip pain and sacral pain. Initial encounter. EXAM: CHEST 1 VIEW COMPARISON:  Chest radiograph performed 08/14/2015 FINDINGS: The lungs are hyperexpanded, with flattening of the hemidiaphragms, compatible with COPD. Biapical scarring is noted. There is no evidence of focal opacification, pleural effusion or pneumothorax. The cardiomediastinal silhouette is borderline normal in size. No acute osseous abnormalities are seen. IMPRESSION: Findings of COPD.  Biapical scarring noted. Electronically Signed   By: Garald Balding M.D.   On: 09/12/2015 00:43   Dg Pelvis 1-2 Views  09/12/2015  CLINICAL DATA:  Status post fall onto hard floor of kitchen, with left hip pain and sacral pain. Initial encounter. EXAM: PELVIS - 1-2 VIEW COMPARISON:  CT of the abdomen and pelvis from 01/15/2015 FINDINGS: There is no evidence of fracture or dislocation. The left hip prosthesis is incompletely imaged but appears grossly unremarkable, without evidence of loosening. The sacrum is grossly unremarkable in appearance. The sacroiliac joints are within normal limits. The visualized bowel gas pattern is within normal limits. IMPRESSION: No evidence of fracture or dislocation. Electronically Signed   By: Garald Balding M.D.   On: 09/12/2015 00:47   Dg Sacrum/coccyx  09/12/2015  CLINICAL DATA:  Status post fall onto hard floor of kitchen, with sharp left-sided hip pain and sacral pain. Initial encounter. EXAM: SACRUM AND COCCYX - 2+ VIEW COMPARISON:  CT of the abdomen and pelvis performed 01/15/2015 FINDINGS:  There is no evidence of fracture or dislocation. The sacrum appears grossly intact. The sacroiliac joints are unremarkable in appearance. The left hip prosthesis is incompletely imaged, but appears grossly unremarkable. The visualized bowel gas pattern is grossly unremarkable in appearance. IMPRESSION: No evidence of fracture or dislocation. Electronically Signed   By: Garald Balding M.D.   On: 09/12/2015 00:46   Ct Head Wo Contrast  09/16/2015  CLINICAL DATA:  81 year old female with history of recent fall. Confusion for the past 2 days. History of meningioma status post surgical resection. History prior stroke. EXAM: CT HEAD WITHOUT CONTRAST TECHNIQUE: Contiguous axial images were obtained from the base of the skull through the vertex without intravenous contrast. COMPARISON:  Multiple priors, most recently head CT 09/11/2015. FINDINGS: Status post right pterional craniotomy. Large area of low attenuation throughout the right MCA distribution involving right frontal, anterior right parietal and anterior right temporal regions, compatible with encephalomalacia/gliosis at site of prior right MCA territory infarction. This is unchanged compared to the prior study. No acute intracranial abnormalities. Specifically, no evidence of acute intracranial hemorrhage, no definite findings of acute/subacute cerebral ischemia, no mass, mass effect, hydrocephalus or abnormal intra or extra-axial fluid collections. Visualized paranasal sinuses and mastoids are well pneumatized. No acute displaced skull fractures are identified. IMPRESSION: 1. No acute intracranial abnormalities. 2. Extensive encephalomalacia/gliosis in the right MCA territory related to remote infarct, unchanged compared to prior examination. 3. Status post right pterional craniotomy. Electronically Signed   By: Vinnie Langton M.D.   On: 09/16/2015 17:50   Ct Head Wo Contrast  09/12/2015  CLINICAL DATA:  Loss of balance with fall. Head and neck pain.  Initial encounter. EXAM: CT HEAD WITHOUT CONTRAST CT CERVICAL SPINE WITHOUT CONTRAST TECHNIQUE: Multidetector CT imaging of the head and cervical spine was performed following the standard protocol without intravenous contrast. Multiplanar CT image reconstructions of the cervical spine were also generated. COMPARISON:  Brain MRI 08/04/2014 FINDINGS: CT HEAD FINDINGS Skull and Sinuses:History of right craniotomy, reportedly for meningioma resection. Negative for fracture or hemo sinus. Visualized orbits: Bilateral cataract resection. No posttraumatic finding. Brain: Remote right MCA territory infarct with large area of encephalomalacia. A MCA branch may have been sacrificed given clip along the right M1 segment. No evidence of acute infarction, hemorrhage, hydrocephalus, or mass lesion/mass effect. CT CERVICAL SPINE FINDINGS Negative for acute fracture or subluxation. No prevertebral edema. No gross cervical canal hematoma. Usual degenerative changes for age. No evidence of significant canal  stenosis. Biapical pleural based scarring. IMPRESSION: 1. No evidence of acute intracranial or cervical spine injury. 2. Remote right MCA territory infarct. Electronically Signed   By: Monte Fantasia M.D.   On: 09/12/2015 00:16   Ct Cervical Spine Wo Contrast  09/12/2015  CLINICAL DATA:  Loss of balance with fall. Head and neck pain. Initial encounter. EXAM: CT HEAD WITHOUT CONTRAST CT CERVICAL SPINE WITHOUT CONTRAST TECHNIQUE: Multidetector CT imaging of the head and cervical spine was performed following the standard protocol without intravenous contrast. Multiplanar CT image reconstructions of the cervical spine were also generated. COMPARISON:  Brain MRI 08/04/2014 FINDINGS: CT HEAD FINDINGS Skull and Sinuses:History of right craniotomy, reportedly for meningioma resection. Negative for fracture or hemo sinus. Visualized orbits: Bilateral cataract resection. No posttraumatic finding. Brain: Remote right MCA territory  infarct with large area of encephalomalacia. A MCA branch may have been sacrificed given clip along the right M1 segment. No evidence of acute infarction, hemorrhage, hydrocephalus, or mass lesion/mass effect. CT CERVICAL SPINE FINDINGS Negative for acute fracture or subluxation. No prevertebral edema. No gross cervical canal hematoma. Usual degenerative changes for age. No evidence of significant canal stenosis. Biapical pleural based scarring. IMPRESSION: 1. No evidence of acute intracranial or cervical spine injury. 2. Remote right MCA territory infarct. Electronically Signed   By: Monte Fantasia M.D.   On: 09/12/2015 00:16    Microbiology: No results found for this or any previous visit (from the past 240 hour(s)).   Labs: Basic Metabolic Panel:  Recent Labs Lab 09/16/15 1623 09/17/15 0626  NA 138  --   K 4.0  --   CL 104  --   CO2 26  --   GLUCOSE 97  --   BUN 37*  --   CREATININE 1.43* 0.92  CALCIUM 10.0  --    Liver Function Tests: No results for input(s): AST, ALT, ALKPHOS, BILITOT, PROT, ALBUMIN in the last 168 hours. No results for input(s): LIPASE, AMYLASE in the last 168 hours. No results for input(s): AMMONIA in the last 168 hours. CBC:  Recent Labs Lab 09/16/15 1623 09/17/15 0626  WBC 6.6 4.2  NEUTROABS 4.6  --   HGB 11.2* 11.0*  HCT 34.9* 34.3*  MCV 94.3 94.0  PLT 237 205   Cardiac Enzymes: No results for input(s): CKTOTAL, CKMB, CKMBINDEX, TROPONINI in the last 168 hours. BNP: BNP (last 3 results)  Recent Labs  08/14/15 0005  BNP 94.0    ProBNP (last 3 results) No results for input(s): PROBNP in the last 8760 hours.  CBG: No results for input(s): GLUCAP in the last 168 hours.     Signed:  Mays Paino MD.  [facility] 09/18/2015, 1:44 PM

## 2015-09-19 ENCOUNTER — Encounter: Payer: Self-pay | Admitting: Nurse Practitioner

## 2015-09-19 ENCOUNTER — Telehealth: Payer: Self-pay | Admitting: Neurology

## 2015-09-19 ENCOUNTER — Telehealth (HOSPITAL_COMMUNITY): Payer: Self-pay

## 2015-09-19 ENCOUNTER — Ambulatory Visit (HOSPITAL_COMMUNITY): Payer: Medicare Other | Admitting: Physical Therapy

## 2015-09-19 ENCOUNTER — Ambulatory Visit (INDEPENDENT_AMBULATORY_CARE_PROVIDER_SITE_OTHER): Payer: Medicare Other | Admitting: Nurse Practitioner

## 2015-09-19 ENCOUNTER — Ambulatory Visit (HOSPITAL_COMMUNITY): Payer: Medicare Other | Admitting: Specialist

## 2015-09-19 VITALS — BP 111/49 | HR 78 | Wt 101.4 lb

## 2015-09-19 DIAGNOSIS — R569 Unspecified convulsions: Secondary | ICD-10-CM

## 2015-09-19 DIAGNOSIS — G20 Parkinson's disease: Secondary | ICD-10-CM

## 2015-09-19 DIAGNOSIS — I63511 Cerebral infarction due to unspecified occlusion or stenosis of right middle cerebral artery: Secondary | ICD-10-CM | POA: Diagnosis not present

## 2015-09-19 DIAGNOSIS — R269 Unspecified abnormalities of gait and mobility: Secondary | ICD-10-CM

## 2015-09-19 DIAGNOSIS — F22 Delusional disorders: Secondary | ICD-10-CM

## 2015-09-19 LAB — VITAMIN B1: VITAMIN B1 (THIAMINE): 106.9 nmol/L (ref 66.5–200.0)

## 2015-09-19 MED ORDER — PIMAVANSERIN TARTRATE 17 MG PO TABS: 34.0000 mg | ORAL_TABLET | Freq: Every day | ORAL | Status: DC

## 2015-09-19 MED ORDER — QUETIAPINE FUMARATE 25 MG PO TABS
ORAL_TABLET | ORAL | Status: DC
Start: 2015-09-19 — End: 2015-10-31

## 2015-09-19 NOTE — Addendum Note (Signed)
Addended by: Marcial Pacas on: 09/19/2015 02:00 PM   Modules accepted: Orders

## 2015-09-19 NOTE — Telephone Encounter (Addendum)
Dr. Krista Blue has sent in a new prescription to the pharmacy.  Mr. Deily aware.  We are still trying to get Nuplazid ordered for the patient.  I will call to check on patient in the morning.

## 2015-09-19 NOTE — Telephone Encounter (Signed)
Patient saw Hoyle Sauer today they wrote her a Rx of Pimavanserin Tartrate 17 MG they checked with 2 pharmacy's and they said that rx is a prescription that is only delivered home. And they said they need something today and really can't wait. The best number to contact is [phone]

## 2015-09-19 NOTE — Telephone Encounter (Signed)
I have talked with her husband Mr. Aebi, who was previously a pharmacist, I have advised him to stop seroquel 25 mg, may take up to 4 tablets maximum at nighttime, hopefully when new rx, nuplazid is available, she can start taking 17mg  2 tabs po qam.

## 2015-09-19 NOTE — Telephone Encounter (Signed)
I have spoken with Dr. Krista Blue and Hoyle Sauer about this patient.  She has been added to Carolyn's schedule today.

## 2015-09-19 NOTE — Telephone Encounter (Signed)
Duplicate task - see further information in separate encounter.

## 2015-09-19 NOTE — Telephone Encounter (Signed)
09/19/15 cx - husband said she had been in the hospital all weeked and just got out today

## 2015-09-19 NOTE — Telephone Encounter (Signed)
I attempted call earlier and could not reach family.  I have now spoken with patient's husband and he would like his wife seen today.  Says she is not aggressive towards others or harming herself but her confusion has been much worse.

## 2015-09-19 NOTE — Patient Instructions (Addendum)
Continue Carbodopa levadopa as ordered. Nuplazid 17mg  2 tabs daily in the morning Continue Lamictal 200 XR daily Continue  Aggrenox at current dose Referral to psych Follow up in 2 weeks

## 2015-09-19 NOTE — Telephone Encounter (Signed)
Please check on her tomorrow in May 2nd 2017.

## 2015-09-19 NOTE — Telephone Encounter (Signed)
Daughter Cristal Generous [phone] called to request appointment today ASAP, patient was seen at [facility] over the weekend, "not sure if this is dementia or drug reaction to LAMICTAL. Thinks daughter and husband are putting things in her food, thinks they are injecting something into her drinks and food". Daughter states hospital ran tests and can't find a medical reason for this. UTI was ruled out. Daughter states patient fell last Sunday, according to Father, doesn't think she hit her head, Monday patient was disoriented, "couldn't find medicine packet, found it in cereal box", Tuesday seemed better on Thursday and Friday patient "was talking out of her head". Izora Gala states patient called neighbors last night at 11:15pm and midnight requesting information about shelters, keeps carrying around a canvas satchel that has her billfold, night gown, medication in it, won't let it out of her sight, wants husband to stay with her most of the time including the bathroom.

## 2015-09-19 NOTE — Progress Notes (Signed)
[facility]  PATIENT: WAYNESHA BESCH DOB: July 09, 1934   REASON FOR VISIT: Follow-up for Parkinson's disease, abnormality of gait, delirium HISTORY FROM: Husband and daughters    HISTORY OF PRESENT ILLNESS: HISTORY MERVIN SANDIFER is a 81 years old right-handed Caucasian female, accompanied by her husband, a retired Software engineer, referred by her primary care physician Dr. Sallee Lange for evaluation of worsening gait difficulty, difficulty initiate gait She had a history of right meningioma, status post resection in 2007, 2 days later, she suffered a large right MCA stroke, also with past medical history of coronary artery disease, status post stent placement, she recovered very well, was able to drive, ambulate without assistance, but with residual left hemiparesthesia, often unpleasant deep achy pain involving left side of her body, She had osteoporosis, left hip fracture in 2014, require replacement in August 2014, since surgery, she had a great decline of her ambulatory ability, she now rely on her walker In 2014, she also developed loss sense of smell, REM sleep disorder, screaming out of her dreams, mild constipation, now she noticed right hand tremor, small handwriting since 2015, mild memory trouble, worsening gait difficulty, difficulty initiate walking using her right leg, tendency to lean backwards, worsening left-sided pain, especially around her left hip, left anterior thigh. She is taking gabapentin, which has been helpful, but 400 mg 4 times a day, will make her sleepy, she is only taking 900 mg daily now, continue have excessive fatigue, daytime sleepiness, difficult to read, double vision.  We have reviewed MRI of the brain August 04 2014, demonstrate large size right MCA stroke, involving right medial, and lateral temporal lobe, no acute lesions.  April 20 first 2016 Since her initial visit August 12 2014, because of mild parkinsonian features, I have  started Sinemet 25/100 one tablet 3 times a day, she is taking it at 10, 3, and 9 PM, no significant improvement, no significant side effect, she complains mild low back pain, continue significant gait difficulty, difficulty picking up her right leg  UPDATE November 11 2014: She can move better after Sinemet dosage was increased to 25/100 mg 4 times a day, physical therapy was helpful, husband reported that she takes Sinemet 1 hour before any meal, become center of her daily activity, She denies significant memory trouble, complains of chronic neck pain, bilateral shoulder pain, left hip pain, low back pain, We have reviewed her MRI lumbar in May 2016, There is lumbosacral transitional anatomy. This report assumes that there are 5 lumbar type vertebral bodies. Recommend close correlation with radiographs if intervention is elected. Moderate multilevel lumbar spondylosis. Degenerative disease is most pronounced at L3-L4 potentially affecting both exiting L3 nerves. Healed sacral insufficiency fractures demonstrated on prior nuclear medicine bone scan.  UPDATE Mar 14 2015: Around Sep 2016, she woke up one night she has bite her left lateral tongue, maybe with urinary incontinence, she is at risk for seizure, we have personally reviewed MRI of the brain with and without contrast in March 2016, large right MCA encephalomalacia involving right temporal parietal frontal lobe.She is taking Sinemet 25/100 ii tid, at 7, 12, 5 PM, which does help her parkinsonian features, she has occasionally upset stomach, husband is concerned about potential interaction of Azilect with tramadol, which she is taking as needed for low back pain  UPDATE Jun 14 2015:She has mild difficult with swallowing, only limited to take medication tablet, has no difficulty swallowing her food, Since Keppra XL our 750 mg was started in  October 2016, she had one more episodes of woke up has bite her tongue in December 2016, husband also noticed she  has increased agitations, continue has mild unsteady gait, left-sided low back pain, hip pain  UPDATE March 29th 2017: She only tried lamotrigine ER 50 mg every night in January, decided not to take it, worried about the side effects, she now taking Sinemet 25/100 mg 2 tablets 3 times a day, tolerating it well, ambulate much better, she can walk 1 mile now She has no recurrent seizure, she did presented to emergency room in August 13 2015 for complaints of shortness of breath, chest pain, troponin was negative, Echocardiogram showed ejection fraction 60%, laboratory showed normal BMP, mild anemia, hemoglobin of 10 point 5.Sinemet and physical therapy has helped her walking UPDATE  09/19/2015 CMMs. Picardo 81 year old female returns for follow-up. She had a hospital admission to [facility] over the weekend for 2 days with diagnosis of acute encephalopathy and paranoia  And acute  kidney injury. She was disoriented. Her lamotrigine was changed about a month ago and her Keppra was slowly titrated. Her husband reports one episode of biting her time in the last month. CT of the head the hospital revealed no acute intracranial abnormalities but extensive encephalomalacia. UTI was ruled out. She has a canvas bag with her with her bill fold and a nightgown and she will not let the family look at this.She does not want the husband to leave her sight. She is refusing to take medications. She also has a history of Parkinson's disease and is on carbidopa levodopa. She has a history of stroke is also on Aggrenox twice daily . She has a 24 7 caregiver .The family called in this morning wanted her to be seen on an urgent basis.     REVIEW OF SYSTEMS: Full 14 system review of systems performed and notable only for those listed, all others are neg:  Constitutional: neg  Cardiovascular: neg Ear/Nose/Throat: neg  Skin: neg Eyes: neg Respiratory: neg Gastroitestinal: Urinary incontinence  Hematology/Lymphatic: neg    Endocrine: neg Musculoskeletal:neg Allergy/Immunology: neg Neurological: One episode of biting her tongue since last seen Psychiatric: neg Sleep : neg   ALLERGIES: Allergies  Allergen Reactions  . Lactose Intolerance (Gi)   . Penicillins Rash    Has patient had a PCN reaction causing immediate rash, facial/tongue/throat swelling, SOB or lightheadedness with hypotension: NO Has patient had a PCN reaction causing severe rash involving mucus membranes or skin necrosis: NO Has patient had a PCN reaction that required hospitalization: NO Has patient had a PCN reaction occurring within the last 10 years: NO If all of the above answers are "NO", then may proceed with Cephalosporin use. Pt can tolerate cephalosporins    HOME MEDICATIONS: Outpatient Prescriptions Prior to Visit  Medication Sig Dispense Refill  . ALPRAZolam (XANAX) 0.25 MG tablet Take 1 tablet (0.25 mg total) by mouth at bedtime as needed for anxiety. 30 tablet 5  . atenolol (TENORMIN) 25 MG tablet Take 12.5 mg by mouth 2 (two) times daily.    Marland Kitchen atorvastatin (LIPITOR) 10 MG tablet TAKE 1 TABLET AT BEDTIME. 30 tablet 5  . calcium-vitamin D (OSCAL WITH D) 500-200 MG-UNIT per tablet Take 1 tablet by mouth 3 (three) times daily.     . carbidopa-levodopa (SINEMET IR) 25-100 MG tablet TAKE (2) TABLETS THREE TIMES DAILY. 180 tablet 11  . cholecalciferol (VITAMIN D) 400 UNITS TABS tablet Take 400 Units by mouth daily.    Marland Kitchen dipyridamole-aspirin (  AGGRENOX) 200-25 MG 12hr capsule TAKE 1 CAPSULE TWICE A DAY 60 capsule 5  . DULoxetine (CYMBALTA) 30 MG capsule TAKE (1) CAPSULE DAILY. 30 capsule 5  . gabapentin (NEURONTIN) 100 MG capsule TAKE 2 CAPSULE IN THE MORNING AND 1 CAPSULE IN THE AFTERNNON 90 capsule 5  . gabapentin (NEURONTIN) 400 MG capsule Take 400 mg by mouth at bedtime.    . hydroxypropyl methylcellulose / hypromellose (ISOPTO TEARS / GONIOVISC) 2.5 % ophthalmic solution Place 1 drop into both eyes as needed for dry eyes.  Reported on 06/20/2015    . LamoTRIgine XR 200 MG TB24 Take 1 tablet (200 mg total) by mouth at bedtime. 90 tablet 4  . polyethylene glycol powder (GLYCOLAX/MIRALAX) powder Take 17 g by mouth 2 (two) times daily. (Patient taking differently: Take 17 g by mouth daily as needed for mild constipation. ) 255 g 0  . polyethylene glycol powder (GLYCOLAX/MIRALAX) powder Take 17 g by mouth daily as needed for mild constipation or moderate constipation.    . potassium chloride (K-DUR) 10 MEQ tablet TAKE (1) TABLET TWICE A DAY. 60 tablet 5  . ranitidine (ZANTAC) 300 MG tablet TAKE 1 TABLET DAILY AS DIRECTED (Patient taking differently: TAKE 1 TABLET DAILY) 30 tablet 5  . torsemide (DEMADEX) 20 MG tablet TAKE 1/2 TABLET EACH MORNING AS NEEDED FOR FLUID    . traMADol (ULTRAM) 50 MG tablet Take 1 tablet (50 mg total) by mouth every 6 (six) hours as needed for moderate pain. 60 tablet 3  . zoledronic acid (RECLAST) 5 MG/100ML SOLN injection Inject 5 mg into the vein once. Takes yearly in December    . QUEtiapine (SEROQUEL) 25 MG tablet Take 0.5 tablets (12.5 mg total) by mouth at bedtime as needed (AGITATION AND PARANOIA.). (Patient not taking: Reported on 09/19/2015) 15 tablet 0   No facility-administered medications prior to visit.    PAST MEDICAL HISTORY: Past Medical History  Diagnosis Date  . Hyperlipemia   . IBS (irritable bowel syndrome)   . Osteoporosis   . Fibromyalgia   . Glaucoma     HAD LASER SURGERY - NO EYE DROPS REQUIRED  . Globus hystericus   . Fibromyalgia   . Meningioma ([place])   . CAD (coronary artery disease)     HEART STENT PLACED ABOUT 2000- NO LONGER SEES CARDIOLOGIST; NO C/O OF CHEST PAINS OR SOB  . Heart murmur     MVP SINCE THE 60'S - TAKES ATENOLOL -  . Anxiety   . Swelling     BILATERAL FEET AND LEGS - ONSET OF SWELLING APRIL 2014  . GERD (gastroesophageal reflux disease)   . Arthritis   . Fracture   . CVA (cerebral vascular accident) ([place])     STROKE 7 [place] TO REMOVE BENIGN MENINGIOMA - HAS LEFT SIDED WEAKNESS AND A LITTLE NUMBNESS  . Pain     "EXCRUCIATING PAIN" LEFT HIP - HAS A FRACTURE - IS CONFINED TO W/C AT HOME - NO WEIGHT BEARING LEFT HIP.  Marland Kitchen Parkinson's disease ([place])     PAST SURGICAL HISTORY: Past Surgical History  Procedure Laterality Date  . Brain surgery      tumor removed  . Tonsillectomy    . Partial hysterectomy    . Glaucoma repair    . Cataract extraction    . Kidney surgery      left - HX ENLARGED LEFT KIDNEY ON XRAY - SURGERY WAS DONE ON URETER   . Colonoscopy    .  Coronary angioplasty    . Hip arthroplasty Left 12/29/2012    Procedure: LEFT HIP HEMI ARTHROPLASTY ;  Surgeon: Mauri Pole, MD;  Location: [facility];  Service: Orthopedics;  Laterality: Left;    FAMILY HISTORY: Family History  Problem Relation Age of Onset  . Liver disease Father   . Diabetes Father   . Coronary artery disease Mother     deseased  . Heart attack Mother   . Hyperlipidemia Mother   . Breast cancer Sister     x 2  . Cancer Sister     breast    SOCIAL HISTORY: Social History   Social History  . Marital Status: Married    Spouse Name: N/A  . Number of Children: 2  . Years of Education: 16   Occupational History  . retired Pharmacist, hospital    Social History Main Topics  . Smoking status: Never Smoker   . Smokeless tobacco: Never Used  . Alcohol Use: No  . Drug Use: No  . Sexual Activity: Not on file   Other Topics Concern  . Not on file   Social History Narrative   Lives at home with husband.   Right- handed   Occasional caffeine use.     PHYSICAL EXAM  Filed Vitals:   09/19/15 1036  BP: 111/49  Pulse: 78  Weight: 101 lb 6.4 oz (45.995 kg)   Body mass index is 17.4 kg/(m^2).  Generalized: Well developed, in no acute distress Except exhibiting some paranoa, well groomed Head: normocephalic and atraumatic,. Oropharynx benign  Neck: Supple, no carotid bruits  Cardiac: Regular rate rhythm, no  murmur  Musculoskeletal: No deformity   Neurological examination   Mentation: Alert oriented to time, place, history taking. Attention span and concentration appropriate.  Follows all commands speech and language fluent.   Cranial nerve II-XII: Fundoscopic exam reveals sharp disc margins.Pupils were equal round reactive to light extraocular movements were full, visual field were full on confrontational test. Facial sensation and strength were normal. hearing was intact to finger rubbing bilaterally. Uvula tongue midline. head turning and shoulder shrug were normal and symmetric.Tongue protrusion into cheek strength was normal. Motor: normal bulk and tone, full strength in the BUE, BLE, fine finger movements normal, no pronator drift. No focal weakness, mild bradykinesia Sensory: Decreased light touch and pinprick and vibratory on the left side of the body  Coordination: finger-nose-finger, heel-to-shin bilaterally, no dysmetria Reflexes: Brachioradialis 2/2, biceps 2/2, triceps 2/2, patellar 2/2, Achilles 2/2, plantar responses were flexor bilaterally. Gait and Station: Rising up from seated position without assistance, decreased arm swing on the left, small steppage, no difficulty with turns no assistive device DIAGNOSTIC DATA (LABS, IMAGING, TESTING) - I reviewed patient records, labs, notes, testing and imaging myself where available.  Lab Results  Component Value Date   WBC 4.2 09/17/2015   HGB 11.0* 09/17/2015   HCT 34.3* 09/17/2015   MCV 94.0 09/17/2015   PLT 205 09/17/2015      Component Value Date/Time   NA 138 09/16/2015 1623   NA 144 06/20/2015 1534   K 4.0 09/16/2015 1623   CL 104 09/16/2015 1623   CO2 26 09/16/2015 1623   GLUCOSE 97 09/16/2015 1623   GLUCOSE 150* 06/20/2015 1534   BUN 37* 09/16/2015 1623   BUN 25 06/20/2015 1534   CREATININE 0.92 09/17/2015 0626   CREATININE 0.82 04/02/2014 1301   CALCIUM 10.0 09/16/2015 1623   PROT 7.6 05/02/2015 1315   PROT 6.8  02/18/2015 1116  ALBUMIN 4.4 05/02/2015 1315   ALBUMIN 4.1 02/18/2015 1116   AST 25 05/02/2015 1315   ALT 5* 05/02/2015 1315   ALKPHOS 49 05/02/2015 1315   BILITOT 0.6 05/02/2015 1315   BILITOT 0.5 02/18/2015 1116   GFRNONAA 58* 09/17/2015 0626   GFRAA >60 09/17/2015 0626   Lab Results  Component Value Date   CHOL 155 02/18/2015   HDL 70 02/18/2015   LDLCALC 74 02/18/2015   TRIG 54 02/18/2015   CHOLHDL 2.2 02/18/2015    Lab Results  Component Value Date   [id number] 09/17/2015   Lab Results  Component Value Date   TSH 2.851 09/17/2015      ASSESSMENT AND PLAN  81 y.o. year old female  has a past medical history of Meningioma ([place]);  Anxiety;  Arthritis; Fracture; right MCA CVA (cerebral vascular accident) ([place]);  Parkinson's disease ([place]). Recent episode of paranoia and confusion Dr. Krista Blue came in and talked to family   Keep Sinemet 25/ 100 tablets 3 times daily Lamotrigine X are 200 mg at night Aggrenox twice daily Begin Nuplazid 17mg  2 tabs every morning Will make psych referral Follow up in 2 weeks with Dr. Luan Pulling, [facility], [facility], [facility] [address], [place], [place] [address] [phone]   Addendum: I examined patient, reviewed history with patient and her family, personally reviewed MRI of brain, CAT scan of the brain, laboratory evaluations,  1. she does have history of complex partial seizure, presented with lateral tongue biting woke up from overnight sleep, could not tolerate Keppra in the past, just finished tapering off Keppra, titrating up lamotrigine xr to 200 mg every night  2. Worsening psychosis, goes along with her stroke, and central nervous system degenerative disorders, Parkinson's disease, involves right side of her body more.  3. Add on Nuplazid 17mg  2 tabs daily, it will take few days from specialty pharmacy, proceed with seroquel 25 mg 1-4 tablets as needed for now.  Face to face  time was 60 minutes, greater than 50% of the time was spent in counseling and coordination of care with the patient

## 2015-09-20 ENCOUNTER — Other Ambulatory Visit: Payer: Self-pay | Admitting: Family Medicine

## 2015-09-20 NOTE — Telephone Encounter (Signed)
Left message for Samantha Hudson at [facility] to return my call - need to speak to her about paperwork for Nuplazid.  Dr. Krista Blue spoke to patient's husband earlier this morning and she is doing better since taking Seroquel last evening.

## 2015-09-20 NOTE — Telephone Encounter (Signed)
I have talked with her husband and her daughter, she took seroquel 25 mg every night, slept well.

## 2015-09-21 ENCOUNTER — Ambulatory Visit: Payer: Medicare Other | Admitting: Family Medicine

## 2015-09-21 NOTE — Telephone Encounter (Signed)
[facility] [phone] called to advise new treatment form was received, patient was sent 14 day supply of Nuplazid, should receive tomorrow. Prior Auth has to be completed with [facility] [phone]. Patient will be activated a copay card, will have 0 dollar copay with copay card.

## 2015-09-21 NOTE — Telephone Encounter (Signed)
Spoke to [employer] at [facility] - she has re-faxed the requested form.  The paperwork is now complete, signed and faxed/confirmed back to [employer] (ph: [phone], fax: [phone]).  The company will overnight the medication samples to the family.  Called Olivia Mackie back again at the pharmacy and made her aware this was completed.

## 2015-09-21 NOTE — Telephone Encounter (Signed)
Failed to reach patient, left a message to her home phone.  Please call patient again, it took about 4 weeks for nuplazid to take effect because of the mechanics him of action, and the long half-life time,  She should overlap Nuplazid 17mg  2 tab every morning with seroquel 25mg  1/2 to one tab po qhs to avoid recurrent mood disturbance,  Also check on how is she doing now?

## 2015-09-22 ENCOUNTER — Ambulatory Visit (HOSPITAL_COMMUNITY): Payer: Medicare Other | Attending: Neurology | Admitting: Physical Therapy

## 2015-09-22 ENCOUNTER — Encounter: Payer: Self-pay | Admitting: *Deleted

## 2015-09-22 DIAGNOSIS — R293 Abnormal posture: Secondary | ICD-10-CM | POA: Insufficient documentation

## 2015-09-22 DIAGNOSIS — M6281 Muscle weakness (generalized): Secondary | ICD-10-CM | POA: Insufficient documentation

## 2015-09-22 DIAGNOSIS — Z9181 History of falling: Secondary | ICD-10-CM | POA: Insufficient documentation

## 2015-09-22 DIAGNOSIS — R279 Unspecified lack of coordination: Secondary | ICD-10-CM | POA: Insufficient documentation

## 2015-09-22 DIAGNOSIS — R2689 Other abnormalities of gait and mobility: Secondary | ICD-10-CM | POA: Insufficient documentation

## 2015-09-22 NOTE — Therapy (Signed)
[place], [place], [address] Phone: [phone]   Fax:  [phone]  Physical Therapy Treatment (Re-Assessment)  Patient Details  Name: Samantha Hudson MRN: [id number] Date of Birth: 12/14/1934 Referring Provider: Marcial Pacas   Encounter Date: 09/22/2015      PT End of Session - 09/22/15 1443    Visit Number 13   Number of Visits 21   Date for PT Re-Evaluation 10/20/15   Authorization Type Medicare and BCBS (G-code done 13th session)   Authorization Time Period 9/89/21 to 1/94/17; recert done 4/08    Authorization - Visit Number 13   Authorization - Number of Visits 23   PT Start Time 1448   PT Stop Time 1432   PT Time Calculation (min) 43 min   Activity Tolerance Patient tolerated treatment well   Behavior During Therapy [facility] for tasks assessed/performed      Past Medical History  Diagnosis Date  . Hyperlipemia   . IBS (irritable bowel syndrome)   . Osteoporosis   . Fibromyalgia   . Glaucoma     HAD LASER SURGERY - NO EYE DROPS REQUIRED  . Globus hystericus   . Fibromyalgia   . Meningioma ([place])   . CAD (coronary artery disease)     HEART STENT PLACED ABOUT 2000- NO LONGER SEES CARDIOLOGIST; NO C/O OF CHEST PAINS OR SOB  . Heart murmur     MVP SINCE THE 60'S - TAKES ATENOLOL -  . Anxiety   . Swelling     BILATERAL FEET AND LEGS - ONSET OF SWELLING APRIL 2014  . GERD (gastroesophageal reflux disease)   . Arthritis   . Fracture   . CVA (cerebral vascular accident) ([place])     STROKE 7 [place] TO REMOVE BENIGN MENINGIOMA - HAS LEFT SIDED WEAKNESS AND A LITTLE NUMBNESS  . Pain     "EXCRUCIATING PAIN" LEFT HIP - HAS A FRACTURE - IS CONFINED TO W/C AT HOME - NO WEIGHT BEARING LEFT HIP.  Marland Kitchen Parkinson's disease [facility])     Past Surgical History  Procedure Laterality Date  . Brain surgery      tumor removed  . Tonsillectomy    . Partial hysterectomy    . Glaucoma repair    .  Cataract extraction    . Kidney surgery      left - HX ENLARGED LEFT KIDNEY ON XRAY - SURGERY WAS DONE ON URETER   . Colonoscopy    . Coronary angioplasty    . Hip arthroplasty Left 12/29/2012    Procedure: LEFT HIP HEMI ARTHROPLASTY ;  Surgeon: Mauri Pole, MD;  Location: [facility];  Service: Orthopedics;  Laterality: Left;    There were no vitals filed for this visit.      Subjective Assessment - 09/22/15 1353    Subjective Patient arrives today after experiencing some confusion and changes in mental status. She reports that she had a fall where the walkers got caught and she fell, hitting her hands more than her head. She reports she has been told she was confused recently, doesn not seem to have a lot of memory of what she was doing.    Pertinent History CAD, hx of CVA, PD, osteoporosis, L hip replacement, hx of possible seizure, fibromyalgia    Patient Stated Goals less pain in L hip and foot and back; improve balance    Currently in Pain? Yes   Pain Score  5    Pain Location Leg   Pain Orientation Left   Pain Descriptors / Indicators Tightness   Pain Type Acute pain   Pain Radiating Towards none    Pain Onset More than a month ago   Pain Frequency Intermittent   Aggravating Factors  sitting up, standing    Pain Relieving Factors moving around    Effect of Pain on Daily Activities difficulty picking things up from floor             [facility] PT Assessment - 09/22/15 0001    Strength   Right Hip Extension 2+/5   Right Hip ABduction 3+/5   Left Hip Extension 2+/5   Left Hip ABduction 3/5   Right Knee Flexion 4/5   Right Knee Extension 4/5   Left Knee Flexion 4-/5   Left Knee Extension 4/5   Right Ankle Dorsiflexion 4/5   Left Ankle Dorsiflexion 4/5   6 minute walk test results    Aerobic Endurance Distance Walked 462   Endurance additional comments 3MWT; no device no breaks    Berg Balance Test   Sit to Stand Able to stand using hands after several tries   Standing  Unsupported Able to stand safely 2 minutes   Sitting with Back Unsupported but Feet Supported on Floor or Stool Able to sit safely and securely 2 minutes   Stand to Sit Uses backs of legs against chair to control descent   Transfers Able to transfer safely, definite need of hands   Standing Unsupported with Eyes Closed Able to stand 10 seconds safely   Standing Ubsupported with Feet Together Able to place feet together independently and stand 1 minute safely   From Standing, Reach Forward with Outstretched Arm Can reach confidently >25 cm (10")   From Standing Position, Pick up Object from Floor Able to pick up shoe, needs supervision   From Standing Position, Turn to Look Behind Over each Shoulder Turn sideways only but maintains balance   Turn 360 Degrees Able to turn 360 degrees safely but slowly   Standing Unsupported, Alternately Place Feet on Step/Stool Able to complete >2 steps/needs minimal assist   Standing Unsupported, One Foot in Front Able to take small step independently and hold 30 seconds   Standing on One Leg Able to lift leg independently and hold 5-10 seconds   Total Score 40                             PT Education - 09/22/15 1442    Education provided Yes   Education Details some backslide in measures likely due to recent health issues that may have caused excess stress/reduced mobility, plan of care moving forward    Person(s) Educated Patient;Spouse   Methods Explanation   Comprehension Verbalized understanding          PT Short Term Goals - 09/06/15 1423    PT SHORT TERM GOAL #1   Title Patient will be able to maintain correct posture at least 70% of the time during all functional situations and scenarios in order to improve overall mobility and reduce fall risk    Time 4   Period Weeks   Status On-going   PT SHORT TERM GOAL #2   Title Patient will be able to complete bed mobility with mod(I), correct sequencnig in order to enhance safety  in mobility at home    Time 4   Period Weeks  Status Achieved   PT SHORT TERM GOAL #3   Title Patient will be able to ambulate at least 850f with LRAD, no rest breaks and no unsteadiness, in order to demonstrate improved mobility with reduced fall risk    Baseline 4/18- 7064fwith no device    Time 4   Period Weeks   Status On-going   PT SHORT TERM GOAL #4   Title Patient will score at least 48 on BERG balance test in order to demonstrate reduced unsteadiness and fall risk    Baseline 4/18- 4778 PT SHORT TERM GOAL #5   Title Patient will consistently and correctly perform appropriate HEP, to be updated PRN    Baseline 4/18- reports she has not been doing them very often lately            PT Long Term Goals - 09/06/15 1427    PT LONG TERM GOAL #1   Title Patient to demonstrate strength 4/5 in all tested muscle groups in order to improve functional stability and mobility, reduce fall risk    Baseline 4/18- DNT today    Time 8   Period Weeks   PT LONG TERM GOAL #2   Title Patient to report no falls, LOB, or close calls with falls within the past 4 weeks in order to demonstrate improved functional and improved safety at home and in community    Baseline 4/18- had some close calls    Time 8   Period Weeks   Status Partially Met   PT LONG TERM GOAL #3   Title Patient to be able to ambulate at least 120080furing 6MWT with LRAD, no rest breaks and no unsteadiness, in order to demosntrate improved general function and readiness to perform community based tasks    Time 8   Period Weeks   Status On-going   PT LONG TERM GOAL #4   Title Patient to score at least 52 on BERG balance test in order to demonstrate minimal fall risk during mobility    Time 8   Period Weeks   Status On-going   PT LONG TERM GOAL #5   Title Patient to be performing at least 30 minutes of exercise, at least 5 days per week, in order to maintain functional gains and to promote improved health habits    Time  8   Period Weeks   Status On-going               Plan - 09/22/15 1444    Clinical Impression Statement Re-assessment performed today due to signficant changes in patient status recently. Patient does reveal some backslide with objective measures, possibly due to stress and reduced mobilty realted to recent events and acute changes in her health status. Also keep in mind that patient has only had 13 visits now intermittently spread over about 2.5  months at this point, consistency of which was interrupted by health problems. Measurse that appear to have worsened include balance, functional transfers, and gait. At this point recommend continuing with skilled PT services with aggressive PWR trainnig program as patient has difficulty with mobility and is at heightened fall risk at this time.     Rehab Potential Excellent   Clinical Impairments Affecting Rehab Potential stress fracture L LE    PT Frequency 2x / week   PT Duration 4 weeks   PT Treatment/Interventions ADLs/Self Care Home Management;DME Instruction;Gait training;Stair training;Functional mobility training;Therapeutic activities;Therapeutic exercise;Balance training;Neuromuscular re-education;Patient/family education;Manual techniques;Energy conservation;Taping   PT Next  Visit Plan aggressive PWR program. RPE at end of session    Consulted and Agree with Plan of Care Patient      Patient will benefit from skilled therapeutic intervention in order to improve the following deficits and impairments:  Abnormal gait, Decreased strength, Decreased balance, Decreased mobility, Difficulty walking, Decreased coordination, Postural dysfunction, Impaired flexibility  Visit Diagnosis: Muscle weakness (generalized)  Other abnormalities of gait and mobility  History of falling  Abnormal posture  Unspecified lack of coordination       G-Codes - 10-14-15 1448    Functional Assessment Tool Used Based on skilled clinical assessment of  strength, gait, balance, functional mobility, functional activity tolerance    Functional Limitation Mobility: Walking and moving around   Mobility: Walking and Moving Around Current Status ([id number]) At least 40 percent but less than 60 percent impaired, limited or restricted   Mobility: Walking and Moving Around Goal Status [phone]) At least 20 percent but less than 40 percent impaired, limited or restricted      Problem List Patient Active Problem List   Diagnosis Date Noted  . Confusion   . Acute encephalopathy 09/17/2015  . Parkinson's disease ([place]) 09/17/2015  . Acute kidney injury ([place]) 09/17/2015  . Altered mental status 09/16/2015  . Anxiety 09/16/2015  . Paranoia ([place]) 09/16/2015  . CAD (coronary artery disease) 08/14/2015  . Chest pain 08/14/2015  . Constipation 08/08/2015  . Seizures ([place]) 06/14/2015  . Degenerative arthritis of thumb 03/03/2015  . Abnormality of gait 08/12/2014  . Parkinsonism ([place]) 08/12/2014  . Stroke ([place]) 08/12/2014  . Muscle weakness (generalized) 02/22/2014  . Stiffness of left knee 02/22/2014  . Hamstring tightness of left lower extremity 02/22/2014  . Pain in joint, pelvic region and thigh 02/22/2014  . Difficulty walking 04/07/2013  . Osteoporosis 03/10/2013  . Anemia 03/10/2013  . Mild malnutrition ([place]) 03/10/2013  . S/P left hip hemiarthroplasty 12/30/2012  . Osteoporosis with pathological fracture with delayed healing 11/25/2012  . Pedal edema 10/27/2012  . Fracture of sacrum ([place]) 10/02/2012  . Stricture and stenosis of esophagus 09/24/2011  . Hyperlipidemia 11/03/2008  . Coronary atherosclerosis 11/03/2008    Deniece Ree PT, DPT [place] [address] [place], [place], [address] Phone: [phone]   Fax:  [phone]  Name: Samantha Hudson MRN: [id number] Date of Birth: 05-Nov-1934

## 2015-09-22 NOTE — Telephone Encounter (Signed)
PA appoved by [facility] [employer] [phone]) through 09/21/16 - [id number] - member [id number].  Approval information faxed and confirmed back to Nu[employer].

## 2015-09-22 NOTE — Telephone Encounter (Signed)
Patient husband call wanted to know about stopping QUEtiapine (SEROQUEL) 25 MG tabletwhile they start the new medication Nuplazid please call and advised.

## 2015-09-22 NOTE — Telephone Encounter (Signed)
Spoke to Mr. Samantha Hudson - says Mrs. Rametta is doing much better - reviewed Dr. Rhea Belton medication instructions with him and he verbalized understanding.  He will call us back with any concerns.

## 2015-09-25 ENCOUNTER — Emergency Department (HOSPITAL_COMMUNITY): Payer: Medicare Other

## 2015-09-25 ENCOUNTER — Inpatient Hospital Stay (HOSPITAL_COMMUNITY)
Admission: EM | Admit: 2015-09-25 | Discharge: 2015-09-29 | DRG: 086 | Disposition: A | Discharge status: Skilled Nursing Facility | Payer: Medicare Other | Attending: General Surgery | Admitting: General Surgery

## 2015-09-25 ENCOUNTER — Encounter (HOSPITAL_COMMUNITY): Payer: Self-pay

## 2015-09-25 ENCOUNTER — Other Ambulatory Visit: Payer: Self-pay

## 2015-09-25 DIAGNOSIS — S42032A Displaced fracture of lateral end of left clavicle, initial encounter for closed fracture: Secondary | ICD-10-CM | POA: Diagnosis present

## 2015-09-25 DIAGNOSIS — R402362 Coma scale, best motor response, obeys commands, at arrival to emergency department: Secondary | ICD-10-CM | POA: Diagnosis present

## 2015-09-25 DIAGNOSIS — M797 Fibromyalgia: Secondary | ICD-10-CM | POA: Diagnosis present

## 2015-09-25 DIAGNOSIS — M810 Age-related osteoporosis without current pathological fracture: Secondary | ICD-10-CM | POA: Diagnosis present

## 2015-09-25 DIAGNOSIS — S2220XA Unspecified fracture of sternum, initial encounter for closed fracture: Secondary | ICD-10-CM

## 2015-09-25 DIAGNOSIS — S065X9A Traumatic subdural hemorrhage with loss of consciousness of unspecified duration, initial encounter: Secondary | ICD-10-CM

## 2015-09-25 DIAGNOSIS — G8929 Other chronic pain: Secondary | ICD-10-CM | POA: Diagnosis present

## 2015-09-25 DIAGNOSIS — E785 Hyperlipidemia, unspecified: Secondary | ICD-10-CM | POA: Diagnosis present

## 2015-09-25 DIAGNOSIS — K219 Gastro-esophageal reflux disease without esophagitis: Secondary | ICD-10-CM | POA: Diagnosis present

## 2015-09-25 DIAGNOSIS — R402252 Coma scale, best verbal response, oriented, at arrival to emergency department: Secondary | ICD-10-CM | POA: Diagnosis present

## 2015-09-25 DIAGNOSIS — S0181XA Laceration without foreign body of other part of head, initial encounter: Secondary | ICD-10-CM | POA: Diagnosis present

## 2015-09-25 DIAGNOSIS — K589 Irritable bowel syndrome without diarrhea: Secondary | ICD-10-CM | POA: Diagnosis present

## 2015-09-25 DIAGNOSIS — Z955 Presence of coronary angioplasty implant and graft: Secondary | ICD-10-CM | POA: Diagnosis not present

## 2015-09-25 DIAGNOSIS — G20 Parkinson's disease: Secondary | ICD-10-CM | POA: Diagnosis present

## 2015-09-25 DIAGNOSIS — S069X9A Unspecified intracranial injury with loss of consciousness of unspecified duration, initial encounter: Secondary | ICD-10-CM | POA: Diagnosis present

## 2015-09-25 DIAGNOSIS — S065X0A Traumatic subdural hemorrhage without loss of consciousness, initial encounter: Principal | ICD-10-CM | POA: Diagnosis present

## 2015-09-25 DIAGNOSIS — D62 Acute posthemorrhagic anemia: Secondary | ICD-10-CM | POA: Diagnosis present

## 2015-09-25 DIAGNOSIS — W010XXA Fall on same level from slipping, tripping and stumbling without subsequent striking against object, initial encounter: Secondary | ICD-10-CM | POA: Diagnosis present

## 2015-09-25 DIAGNOSIS — S2242XA Multiple fractures of ribs, left side, initial encounter for closed fracture: Secondary | ICD-10-CM | POA: Diagnosis present

## 2015-09-25 DIAGNOSIS — S42002A Fracture of unspecified part of left clavicle, initial encounter for closed fracture: Secondary | ICD-10-CM | POA: Diagnosis present

## 2015-09-25 DIAGNOSIS — I2510 Atherosclerotic heart disease of native coronary artery without angina pectoris: Secondary | ICD-10-CM | POA: Diagnosis present

## 2015-09-25 DIAGNOSIS — Z96642 Presence of left artificial hip joint: Secondary | ICD-10-CM | POA: Diagnosis present

## 2015-09-25 DIAGNOSIS — R402142 Coma scale, eyes open, spontaneous, at arrival to emergency department: Secondary | ICD-10-CM | POA: Diagnosis present

## 2015-09-25 DIAGNOSIS — S0093XA Contusion of unspecified part of head, initial encounter: Secondary | ICD-10-CM | POA: Diagnosis not present

## 2015-09-25 DIAGNOSIS — S069XAA Unspecified intracranial injury with loss of consciousness status unknown, initial encounter: Secondary | ICD-10-CM | POA: Diagnosis present

## 2015-09-25 DIAGNOSIS — W19XXXA Unspecified fall, initial encounter: Secondary | ICD-10-CM | POA: Diagnosis present

## 2015-09-25 DIAGNOSIS — S065XAA Traumatic subdural hemorrhage with loss of consciousness status unknown, initial encounter: Secondary | ICD-10-CM

## 2015-09-25 LAB — CBC WITH DIFFERENTIAL/PLATELET
Basophils Absolute: 0 10*3/uL (ref 0.0–0.1)
Basophils Relative: 0 %
EOS ABS: 0.1 10*3/uL (ref 0.0–0.7)
EOS PCT: 1 %
HCT: 35.1 % — ABNORMAL LOW (ref 36.0–46.0)
Hemoglobin: 10.9 g/dL — ABNORMAL LOW (ref 12.0–15.0)
LYMPHS ABS: 0.7 10*3/uL (ref 0.7–4.0)
LYMPHS PCT: 8 %
MCH: 29.1 pg (ref 26.0–34.0)
MCHC: 31.1 g/dL (ref 30.0–36.0)
MCV: 93.9 fL (ref 78.0–100.0)
MONO ABS: 0.6 10*3/uL (ref 0.1–1.0)
MONOS PCT: 7 %
Neutro Abs: 7 10*3/uL (ref 1.7–7.7)
Neutrophils Relative %: 84 %
PLATELETS: 192 10*3/uL (ref 150–400)
RBC: 3.74 MIL/uL — AB (ref 3.87–5.11)
RDW: 13.4 % (ref 11.5–15.5)
WBC: 8.3 10*3/uL (ref 4.0–10.5)

## 2015-09-25 LAB — COMPREHENSIVE METABOLIC PANEL
ANION GAP: 14 (ref 5–15)
AST: 22 U/L (ref 15–41)
Albumin: 3.7 g/dL (ref 3.5–5.0)
Alkaline Phosphatase: 77 U/L (ref 38–126)
BUN: 20 mg/dL (ref 6–20)
CALCIUM: 9.5 mg/dL (ref 8.9–10.3)
CHLORIDE: 101 mmol/L (ref 101–111)
CO2: 26 mmol/L (ref 22–32)
CREATININE: 0.97 mg/dL (ref 0.44–1.00)
GFR, EST NON AFRICAN AMERICAN: 55 mL/min — AB (ref >60)
Glucose, Bld: 102 mg/dL — ABNORMAL HIGH (ref 65–99)
Potassium: 4.4 mmol/L (ref 3.5–5.1)
SODIUM: 141 mmol/L (ref 135–145)
Total Bilirubin: 0.9 mg/dL (ref 0.3–1.2)
Total Protein: 7 g/dL (ref 6.5–8.1)

## 2015-09-25 LAB — URINALYSIS, ROUTINE W REFLEX MICROSCOPIC
BILIRUBIN URINE: NEGATIVE
Glucose, UA: NEGATIVE mg/dL
KETONES UR: NEGATIVE mg/dL
Nitrite: NEGATIVE
PH: 7.5 (ref 5.0–8.0)
Protein, ur: 30 mg/dL — AB
Specific Gravity, Urine: 1.018 (ref 1.005–1.030)

## 2015-09-25 LAB — PROTIME-INR
INR: 1.09 (ref 0.00–1.49)
PROTHROMBIN TIME: 14.3 s (ref 11.6–15.2)

## 2015-09-25 LAB — URINE MICROSCOPIC-ADD ON

## 2015-09-25 LAB — MRSA PCR SCREENING: MRSA BY PCR: NEGATIVE

## 2015-09-25 LAB — APTT: aPTT: 36 seconds (ref 24–37)

## 2015-09-25 LAB — I-STAT TROPONIN, ED: TROPONIN I, POC: 0 ng/mL (ref 0.00–0.08)

## 2015-09-25 MED ORDER — GABAPENTIN 100 MG PO CAPS
200.0000 mg | ORAL_CAPSULE | Freq: Every day | ORAL | Status: DC
  Administered 2015-09-26 – 2015-09-29 (×4): 200 mg via ORAL
  Filled 2015-09-25 (×6): qty 2

## 2015-09-25 MED ORDER — DULOXETINE HCL 30 MG PO CPEP
30.0000 mg | ORAL_CAPSULE | Freq: Every day | ORAL | Status: DC
  Administered 2015-09-26 – 2015-09-29 (×4): 30 mg via ORAL
  Filled 2015-09-25 (×4): qty 1

## 2015-09-25 MED ORDER — ONDANSETRON HCL 4 MG PO TABS
4.0000 mg | ORAL_TABLET | Freq: Four times a day (QID) | ORAL | Status: DC | PRN
Start: 2015-09-25 — End: 2015-09-29

## 2015-09-25 MED ORDER — CARBIDOPA-LEVODOPA 25-100 MG PO TABS: 2.0000 | ORAL_TABLET | Freq: Three times a day (TID) | ORAL | Status: DC

## 2015-09-25 MED ORDER — POLYVINYL ALCOHOL 1.4 % OP SOLN
1.0000 [drp] | OPHTHALMIC | Status: DC | PRN
  Filled 2015-09-25: qty 15

## 2015-09-25 MED ORDER — PIMAVANSERIN TARTRATE 17 MG PO TABS: 34.0000 mg | ORAL_TABLET | Freq: Every day | ORAL | Status: DC

## 2015-09-25 MED ORDER — LAMOTRIGINE ER 200 MG PO TB24
200.0000 mg | ORAL_TABLET | Freq: Every day | ORAL | Status: DC
  Filled 2015-09-25: qty 1

## 2015-09-25 MED ORDER — GABAPENTIN 400 MG PO CAPS
400.0000 mg | ORAL_CAPSULE | Freq: Every day | ORAL | Status: DC
  Administered 2015-09-25 – 2015-09-28 (×3): 400 mg via ORAL
  Filled 2015-09-25 (×4): qty 1

## 2015-09-25 MED ORDER — CARBIDOPA-LEVODOPA 25-100 MG PO TABS
2.0000 | ORAL_TABLET | Freq: Three times a day (TID) | ORAL | Status: DC
  Administered 2015-09-25: 2 via ORAL
  Filled 2015-09-25 (×2): qty 2

## 2015-09-25 MED ORDER — POLYETHYLENE GLYCOL 3350 17 GM/SCOOP PO POWD: 17.0000 g | Freq: Every day | ORAL | Status: DC | PRN

## 2015-09-25 MED ORDER — CARBIDOPA-LEVODOPA 25-100 MG PO TABS
2.0000 | ORAL_TABLET | Freq: Three times a day (TID) | ORAL | Status: DC
  Administered 2015-09-25 – 2015-09-29 (×13): 2 via ORAL
  Filled 2015-09-25 (×13): qty 2

## 2015-09-25 MED ORDER — QUETIAPINE FUMARATE 50 MG PO TABS
25.0000 mg | ORAL_TABLET | Freq: Every day | ORAL | Status: DC
  Administered 2015-09-25 – 2015-09-28 (×3): 25 mg via ORAL
  Filled 2015-09-25 (×4): qty 1

## 2015-09-25 MED ORDER — POLYETHYLENE GLYCOL 3350 17 G PO PACK
17.0000 g | PACK | Freq: Every day | ORAL | Status: DC | PRN
  Filled 2015-09-25: qty 1

## 2015-09-25 MED ORDER — SODIUM CHLORIDE 0.9 % IV SOLN
INTRAVENOUS | Status: DC
Start: 2015-09-25 — End: 2015-09-26
  Administered 2015-09-25: 19:00:00 via INTRAVENOUS

## 2015-09-25 MED ORDER — LEVETIRACETAM 500 MG PO TABS
500.0000 mg | ORAL_TABLET | Freq: Two times a day (BID) | ORAL | Status: DC
  Administered 2015-09-25 – 2015-09-29 (×2): 500 mg via ORAL
  Filled 2015-09-25 (×6): qty 1

## 2015-09-25 MED ORDER — ATENOLOL 25 MG PO TABS
12.5000 mg | ORAL_TABLET | Freq: Two times a day (BID) | ORAL | Status: DC
  Administered 2015-09-25 – 2015-09-29 (×7): 12.5 mg via ORAL
  Filled 2015-09-25 (×9): qty 1

## 2015-09-25 MED ORDER — TRAMADOL HCL 50 MG PO TABS
50.0000 mg | ORAL_TABLET | Freq: Four times a day (QID) | ORAL | Status: DC
  Administered 2015-09-25 – 2015-09-26 (×2): 50 mg via ORAL
  Filled 2015-09-25 (×3): qty 1

## 2015-09-25 MED ORDER — IOPAMIDOL (ISOVUE-300) INJECTION 61%
INTRAVENOUS | Status: AC
  Administered 2015-09-25: 75 mL
  Filled 2015-09-25: qty 75

## 2015-09-25 MED ORDER — GABAPENTIN 100 MG PO CAPS
100.0000 mg | ORAL_CAPSULE | Freq: Every day | ORAL | Status: DC
  Administered 2015-09-25 – 2015-09-29 (×5): 100 mg via ORAL
  Filled 2015-09-25 (×3): qty 1

## 2015-09-25 MED ORDER — FENTANYL CITRATE (PF) 100 MCG/2ML IJ SOLN
50.0000 ug | Freq: Once | INTRAMUSCULAR | Status: AC
  Administered 2015-09-25: 50 ug via INTRAVENOUS
  Filled 2015-09-25: qty 2

## 2015-09-25 MED ORDER — HYPROMELLOSE (GONIOSCOPIC) 2.5 % OP SOLN: 1.0000 [drp] | OPHTHALMIC | Status: DC | PRN

## 2015-09-25 MED ORDER — PIMAVANSERIN TARTRATE 17 MG PO TABS
34.0000 mg | ORAL_TABLET | Freq: Every day | ORAL | Status: DC
  Administered 2015-09-26 – 2015-09-29 (×4): 34 mg via ORAL
  Filled 2015-09-25 (×5): qty 2

## 2015-09-25 MED ORDER — ONDANSETRON HCL 4 MG/2ML IJ SOLN
4.0000 mg | Freq: Four times a day (QID) | INTRAMUSCULAR | Status: DC | PRN
  Administered 2015-09-27: 4 mg via INTRAVENOUS
  Filled 2015-09-25: qty 2

## 2015-09-25 MED ORDER — LAMOTRIGINE ER 200 MG PO TB24
200.0000 mg | ORAL_TABLET | Freq: Every day | ORAL | Status: DC
  Administered 2015-09-27 – 2015-09-28 (×2): 200 mg via ORAL
  Filled 2015-09-25 (×4): qty 1

## 2015-09-25 NOTE — H&P (Signed)
Samantha Hudson is an 81 y.o. female.   Chief Complaint: fall HPI: 68 yof who sustained fall at home where she lives with her husband and a caretaker.  She has left sided pain. Has history of parkinsons, chronic pain, cad with a stent.  About a decade ago she had meningioma excised that was complicated by mca stroke.  She is on aggrenox since then.  She has some left sided deficits.  She does not really remember fall. Her husband thinks she tripped on his walker.     Past Medical History  Diagnosis Date  . Hyperlipemia   . IBS (irritable bowel syndrome)   . Osteoporosis   . Fibromyalgia   . Glaucoma     HAD LASER SURGERY - NO EYE DROPS REQUIRED  . Globus hystericus   . Fibromyalgia   . Meningioma ([place])   . CAD (coronary artery disease)     HEART STENT PLACED ABOUT 2000- NO LONGER SEES CARDIOLOGIST; NO C/O OF CHEST PAINS OR SOB  . Heart murmur     MVP SINCE THE 60'S - TAKES ATENOLOL -  . Anxiety   . Swelling     BILATERAL FEET AND LEGS - ONSET OF SWELLING APRIL 2014  . GERD (gastroesophageal reflux disease)   . Arthritis   . Fracture   . CVA (cerebral vascular accident) ([place])     STROKE 7 [place] TO REMOVE BENIGN MENINGIOMA - HAS LEFT SIDED WEAKNESS AND A LITTLE NUMBNESS  . Pain     "EXCRUCIATING PAIN" LEFT HIP - HAS A FRACTURE - IS CONFINED TO W/C AT HOME - NO WEIGHT BEARING LEFT HIP.  Marland Kitchen Parkinson's disease [facility])     Past Surgical History  Procedure Laterality Date  . Brain surgery      tumor removed  . Tonsillectomy    . Partial hysterectomy    . Glaucoma repair    . Cataract extraction    . Kidney surgery      left - HX ENLARGED LEFT KIDNEY ON XRAY - SURGERY WAS DONE ON URETER   . Colonoscopy    . Coronary angioplasty    . Hip arthroplasty Left 12/29/2012    Procedure: LEFT HIP HEMI ARTHROPLASTY ;  Surgeon: Mauri Pole, MD;  Location: [facility];  Service: Orthopedics;  Laterality: Left;    Family History  Problem Relation Age of Onset  .  Liver disease Father   . Diabetes Father   . Coronary artery disease Mother     deseased  . Heart attack Mother   . Hyperlipidemia Mother   . Breast cancer Sister     x 2  . Cancer Sister     breast   Social History:  reports that she has never smoked. She has never used smokeless tobacco. She reports that she does not drink alcohol or use illicit drugs.  Allergies:  Allergies  Allergen Reactions  . Lactose Intolerance (Gi) Nausea And Vomiting  . Penicillins Rash    Has patient had a PCN reaction causing immediate rash, facial/tongue/throat swelling, SOB or lightheadedness with hypotension: NO Has patient had a PCN reaction causing severe rash involving mucus membranes or skin necrosis: NO Has patient had a PCN reaction that required hospitalization: NO Has patient had a PCN reaction occurring within the last 10 years: NO If all of the above answers are "NO", then may proceed with Cephalosporin use. Pt can tolerate cephalosporins   meds reviewed  Results for  orders placed or performed during the hospital encounter of 09/25/15 (from the past 48 hour(s))  CBC with Differential/Platelet     Status: Abnormal   Collection Time: 09/25/15 11:56 AM  Result Value Ref Range   WBC 8.3 4.0 - 10.5 K/uL   RBC 3.74 (L) 3.87 - 5.11 MIL/uL   Hemoglobin 10.9 (L) 12.0 - 15.0 g/dL   HCT 35.1 (L) 36.0 - 46.0 %   MCV 93.9 78.0 - 100.0 fL   MCH 29.1 26.0 - 34.0 pg   MCHC 31.1 30.0 - 36.0 g/dL   RDW 13.4 11.5 - 15.5 %   Platelets 192 150 - 400 K/uL   Neutrophils Relative % 84 %   Neutro Abs 7.0 1.7 - 7.7 K/uL   Lymphocytes Relative 8 %   Lymphs Abs 0.7 0.7 - 4.0 K/uL   Monocytes Relative 7 %   Monocytes Absolute 0.6 0.1 - 1.0 K/uL   Eosinophils Relative 1 %   Eosinophils Absolute 0.1 0.0 - 0.7 K/uL   Basophils Relative 0 %   Basophils Absolute 0.0 0.0 - 0.1 K/uL  Comprehensive metabolic panel     Status: Abnormal   Collection Time: 09/25/15 11:56 AM  Result Value Ref Range   Sodium 141  135 - 145 mmol/L   Potassium 4.4 3.5 - 5.1 mmol/L   Chloride 101 101 - 111 mmol/L   CO2 26 22 - 32 mmol/L   Glucose, Bld 102 (H) 65 - 99 mg/dL   BUN 20 6 - 20 mg/dL   Creatinine, Ser 0.97 0.44 - 1.00 mg/dL   Calcium 9.5 8.9 - 10.3 mg/dL   Total Protein 7.0 6.5 - 8.1 g/dL   Albumin 3.7 3.5 - 5.0 g/dL   AST 22 15 - 41 U/L   ALT <5 (L) 14 - 54 U/L   Alkaline Phosphatase 77 38 - 126 U/L   Total Bilirubin 0.9 0.3 - 1.2 mg/dL   GFR calc non Af Amer 55 (L) >60 mL/min   GFR calc Af Amer >60 >60 mL/min    Comment: (NOTE) The eGFR has been calculated using the CKD EPI equation. This calculation has not been validated in all clinical situations. eGFR's persistently <60 mL/min signify possible Chronic Kidney Disease.    Anion gap 14 5 - 15  Protime-INR     Status: None   Collection Time: 09/25/15 11:56 AM  Result Value Ref Range   Prothrombin Time 14.3 11.6 - 15.2 seconds   INR 1.09 0.00 - 1.49  APTT     Status: None   Collection Time: 09/25/15 11:56 AM  Result Value Ref Range   aPTT 36 24 - 37 seconds  I-stat troponin, ED     Status: None   Collection Time: 09/25/15 12:02 PM  Result Value Ref Range   Troponin i, poc 0.00 0.00 - 0.08 ng/mL   Comment 3            Comment: Due to the release kinetics of cTnI, a negative result within the first hours of the onset of symptoms does not rule out myocardial infarction with certainty. If myocardial infarction is still suspected, repeat the test at appropriate intervals.   Urinalysis, Routine w reflex microscopic (not at [facility])     Status: Abnormal   Collection Time: 09/25/15  1:21 PM  Result Value Ref Range   Color, Urine YELLOW YELLOW   APPearance HAZY (A) CLEAR   Specific Gravity, Urine 1.018 1.005 - 1.030   pH 7.5 5.0 -  8.0   Glucose, UA NEGATIVE NEGATIVE mg/dL   Hgb urine dipstick MODERATE (A) NEGATIVE   Bilirubin Urine NEGATIVE NEGATIVE   Ketones, ur NEGATIVE NEGATIVE mg/dL   Protein, ur 30 (A) NEGATIVE mg/dL   Nitrite  NEGATIVE NEGATIVE   Leukocytes, UA SMALL (A) NEGATIVE  Urine microscopic-add on     Status: Abnormal   Collection Time: 09/25/15  1:21 PM  Result Value Ref Range   Squamous Epithelial / LPF 0-5 (A) NONE SEEN   WBC, UA 6-30 0 - 5 WBC/hpf   RBC / HPF 6-30 0 - 5 RBC/hpf   Bacteria, UA FEW (A) NONE SEEN   Urine-Other AMORPHOUS URATES/PHOSPHATES    Dg Ribs Unilateral W/chest Left  09/25/2015  CLINICAL DATA:  Pain following fall EXAM: LEFT RIBS AND CHEST - 3+ VIEW COMPARISON:  Chest radiograph September 11, 2015 ; left shoulder Sep 25, 2015 FINDINGS: Frontal chest as well as oblique and cone-down lower rib images were obtained. There is no edema or consolidation. Heart size and pulmonary vascularity are normal. There is atherosclerotic calcification in the aorta. There is a fracture of the lateral left clavicle, better seen on left shoulder radiographs. There are displaced left-sided lateral rib fractures involving the left third, fourth, and fifth ribs. No pneumothorax or pleural effusion. IMPRESSION: Fractures of the left lateral third, fourth, and fifth ribs as well as the lateral left clavicle. No pneumothorax or pleural effusion. No edema or consolidation. Electronically Signed   By: Lowella Grip III M.D.   On: 09/25/2015 12:43   Ct Head Wo Contrast  09/25/2015  CLINICAL DATA:  Patient status post fall. Left-sided weakness. Left eye bruising and swelling. EXAM: CT HEAD WITHOUT CONTRAST CT MAXILLOFACIAL WITHOUT CONTRAST CT CERVICAL SPINE WITHOUT CONTRAST TECHNIQUE: Multidetector CT imaging of the head, cervical spine, and maxillofacial structures were performed using the standard protocol without intravenous contrast. Multiplanar CT image reconstructions of the cervical spine and maxillofacial structures were also generated. COMPARISON:  CT brain 09/16/2015 FINDINGS: CT HEAD FINDINGS Ventricles and sulci are appropriate for patient's age. Small amount of extra-axial blood products overlying the left  cerebral hemisphere (image 18; series 201) measuring 4 mm in thickness, favored to be subdural in etiology. No evidence for acute cortically based infarct or mass lesion or mass-effect. Large area of encephalomalacia throughout the right MCA distribution, grossly unchanged from prior. Orbits are unremarkable. Left for a orbital soft tissue swelling. Soft tissue hematoma overlying the left calvarium. Right pterional craniotomy. Mastoid air cells are unremarkable. No displaced skull fracture. CT MAXILLOFACIAL FINDINGS Left periorbital soft tissue swelling and hematoma formation. Paranasal sinuses are unremarkable. No evidence for acute maxillofacial fracture. The nasal bone, zygomatic arches and pterygoid plates are intact. Maxilla and mandible are intact. Orbital walls are intact. CT CERVICAL SPINE FINDINGS Preservation of the vertebral body and intervertebral disc space heights. Normal anatomic alignment. No evidence for acute cervical spine fracture. Mild facet degenerative changes left C4-5. Prevertebral soft tissues are unremarkable. Visualized thyroid is unremarkable. Biapical pleural parenchymal thickening and scarring. There is a probable acute fracture of the superior aspect of the sternum, incompletely included on current exam (image 91; series 41). Nondisplaced left anterior first rib fracture. Nondisplaced distal left clavicle fracture. IMPRESSION: Small amount of extra-axial blood products overlying the left cerebral hemisphere measuring up to 4 mm in thickness, favored to be subdural in etiology. Sternal fracture, incompletely included on current evaluation. Nondisplaced left anterior first rib fracture. No acute maxillofacial fracture. No acute cervical spine fracture. Re-  demonstrated large area of encephalomalacia within the right MCA territory. Left periorbital soft tissue swelling/hematoma formation. These results were called by telephone at the time of interpretation on 09/25/2015 at 1:24 pm to Dr.  Julianne Rice , who verbally acknowledged these results. Electronically Signed   By: Lovey Newcomer M.D.   On: 09/25/2015 13:47   Ct Chest W Contrast  09/25/2015  CLINICAL DATA:  Pain following fall EXAM: CT CHEST WITH CONTRAST TECHNIQUE: Multidetector CT imaging of the chest was performed during intravenous contrast administration. CONTRAST:  5m ISOVUE-300 IOPAMIDOL (ISOVUE-300) INJECTION 61% COMPARISON:  Chest and ribs Sep 25, 2015 ; chest CT March 02, 2015 FINDINGS: Mediastinum/Lymph Nodes: There is no appreciable mediastinal hematoma. There is no thoracic aortic aneurysm or dissection. The visualized great vessels appear intact and unremarkable. There is atherosclerotic calcification in the aorta. There is no demonstrable pulmonary embolus. Pericardium is not thickened. There is ventricular hypertrophy. There are foci of coronary artery calcification. Thyroid appears normal. There is no appreciable thoracic adenopathy. Lungs/Pleura: There is scarring in each lung apex. There is no appreciable pneumothorax or parenchymal lung contusion. There is bibasilar patchy atelectasis. There is also mild scarring in the lung bases with mild lower lobe bronchiectatic change on the left. Upper abdomen: In the visualized upper abdomen, there is atherosclerotic calcification in the aorta. Visualized upper abdominal structures otherwise appear unremarkable. Musculoskeletal: There is a fracture of the mid the distal manubrium with adjacent hematoma in this area. There are fractures, displaced, of the anterior left third, fourth, and fifth ribs. Fracture of the lateral left clavicle is better seen on radiography. There is stable marked collapse of the T7 vertebral body with moderate wedging of the T8 vertebral body, stable. No retropulsion of bone seen. IMPRESSION: Comminuted manubrial fracture with adjacent soft tissue hematoma, not causing appreciable mass effect on the anterior mediastinum. Fractures of the anterior left  third, fourth, and fifth ribs with mild displacement in these areas. No pneumothorax or parenchymal lung contusion. Fracture lateral left clavicle better seen on radiography. Bibasilar atelectasis. Apical lung scarring bilaterally. Mild left lower lobe bronchiectatic change. No demonstrable mediastinal hematoma. No vascular disruption evident. Atherosclerotic calcification is noted in the aorta. There are foci of coronary artery calcification. Stable marked collapse of the T7 vertebral body. Stable moderate wedging of the T8 vertebral body. Electronically Signed   By: WLowella GripIII M.D.   On: 09/25/2015 15:23   Ct Cervical Spine Wo Contrast  09/25/2015  CLINICAL DATA:  Patient status post fall. Left-sided weakness. Left eye bruising and swelling. EXAM: CT HEAD WITHOUT CONTRAST CT MAXILLOFACIAL WITHOUT CONTRAST CT CERVICAL SPINE WITHOUT CONTRAST TECHNIQUE: Multidetector CT imaging of the head, cervical spine, and maxillofacial structures were performed using the standard protocol without intravenous contrast. Multiplanar CT image reconstructions of the cervical spine and maxillofacial structures were also generated. COMPARISON:  CT brain 09/16/2015 FINDINGS: CT HEAD FINDINGS Ventricles and sulci are appropriate for patient's age. Small amount of extra-axial blood products overlying the left cerebral hemisphere (image 18; series 201) measuring 4 mm in thickness, favored to be subdural in etiology. No evidence for acute cortically based infarct or mass lesion or mass-effect. Large area of encephalomalacia throughout the right MCA distribution, grossly unchanged from prior. Orbits are unremarkable. Left for a orbital soft tissue swelling. Soft tissue hematoma overlying the left calvarium. Right pterional craniotomy. Mastoid air cells are unremarkable. No displaced skull fracture. CT MAXILLOFACIAL FINDINGS Left periorbital soft tissue swelling and hematoma formation. Paranasal sinuses are unremarkable. No  evidence for acute maxillofacial fracture. The nasal bone, zygomatic arches and pterygoid plates are intact. Maxilla and mandible are intact. Orbital walls are intact. CT CERVICAL SPINE FINDINGS Preservation of the vertebral body and intervertebral disc space heights. Normal anatomic alignment. No evidence for acute cervical spine fracture. Mild facet degenerative changes left C4-5. Prevertebral soft tissues are unremarkable. Visualized thyroid is unremarkable. Biapical pleural parenchymal thickening and scarring. There is a probable acute fracture of the superior aspect of the sternum, incompletely included on current exam (image 91; series 41). Nondisplaced left anterior first rib fracture. Nondisplaced distal left clavicle fracture. IMPRESSION: Small amount of extra-axial blood products overlying the left cerebral hemisphere measuring up to 4 mm in thickness, favored to be subdural in etiology. Sternal fracture, incompletely included on current evaluation. Nondisplaced left anterior first rib fracture. No acute maxillofacial fracture. No acute cervical spine fracture. Re- demonstrated large area of encephalomalacia within the right MCA territory. Left periorbital soft tissue swelling/hematoma formation. These results were called by telephone at the time of interpretation on 09/25/2015 at 1:24 pm to Dr. Julianne Rice , who verbally acknowledged these results. Electronically Signed   By: Lovey Newcomer M.D.   On: 09/25/2015 13:47   Dg Shoulder Left  09/25/2015  CLINICAL DATA:  Pain following fall EXAM: LEFT SHOULDER - 2+ VIEW COMPARISON:  None. FINDINGS: Frontal and Y scapular images were obtained. There is a fracture of the distal left clavicle with inferior displacement and angulation distally. There is approximately 1.5 cm of overlapping fracture fragments in the area of the lateral left clavicle. No other fracture. No dislocation. There is mild glenohumeral joint space narrowing. No erosive change. Visualized  left lung clear. IMPRESSION: Displaced fracture distal left clavicle with approximately 1.5 cm of overriding of fracture fragments. No other fracture. No dislocation. Mild narrowing glenohumeral joint. Electronically Signed   By: Lowella Grip III M.D.   On: 09/25/2015 12:40   Ct Maxillofacial Wo Cm  09/25/2015  CLINICAL DATA:  Patient status post fall. Left-sided weakness. Left eye bruising and swelling. EXAM: CT HEAD WITHOUT CONTRAST CT MAXILLOFACIAL WITHOUT CONTRAST CT CERVICAL SPINE WITHOUT CONTRAST TECHNIQUE: Multidetector CT imaging of the head, cervical spine, and maxillofacial structures were performed using the standard protocol without intravenous contrast. Multiplanar CT image reconstructions of the cervical spine and maxillofacial structures were also generated. COMPARISON:  CT brain 09/16/2015 FINDINGS: CT HEAD FINDINGS Ventricles and sulci are appropriate for patient's age. Small amount of extra-axial blood products overlying the left cerebral hemisphere (image 18; series 201) measuring 4 mm in thickness, favored to be subdural in etiology. No evidence for acute cortically based infarct or mass lesion or mass-effect. Large area of encephalomalacia throughout the right MCA distribution, grossly unchanged from prior. Orbits are unremarkable. Left for a orbital soft tissue swelling. Soft tissue hematoma overlying the left calvarium. Right pterional craniotomy. Mastoid air cells are unremarkable. No displaced skull fracture. CT MAXILLOFACIAL FINDINGS Left periorbital soft tissue swelling and hematoma formation. Paranasal sinuses are unremarkable. No evidence for acute maxillofacial fracture. The nasal bone, zygomatic arches and pterygoid plates are intact. Maxilla and mandible are intact. Orbital walls are intact. CT CERVICAL SPINE FINDINGS Preservation of the vertebral body and intervertebral disc space heights. Normal anatomic alignment. No evidence for acute cervical spine fracture. Mild facet  degenerative changes left C4-5. Prevertebral soft tissues are unremarkable. Visualized thyroid is unremarkable. Biapical pleural parenchymal thickening and scarring. There is a probable acute fracture of the superior aspect of the sternum, incompletely included on current  exam (image 91; series 41). Nondisplaced left anterior first rib fracture. Nondisplaced distal left clavicle fracture. IMPRESSION: Small amount of extra-axial blood products overlying the left cerebral hemisphere measuring up to 4 mm in thickness, favored to be subdural in etiology. Sternal fracture, incompletely included on current evaluation. Nondisplaced left anterior first rib fracture. No acute maxillofacial fracture. No acute cervical spine fracture. Re- demonstrated large area of encephalomalacia within the right MCA territory. Left periorbital soft tissue swelling/hematoma formation. These results were called by telephone at the time of interpretation on 09/25/2015 at 1:24 pm to Dr. Julianne Rice , who verbally acknowledged these results. Electronically Signed   By: Lovey Newcomer M.D.   On: 09/25/2015 13:47    Review of Systems  Unable to perform ROS: other    Blood pressure 156/75, pulse 88, temperature 97.8 F (36.6 C), temperature source Oral, resp. rate 24, height 5' 5"  (1.651 m), weight 46.72 kg (103 lb), SpO2 97 %. Physical Exam  Constitutional: She is oriented to person, place, and time. Vital signs are normal. She appears cachectic.  HENT:  Head: Normocephalic. Head is with contusion (left periorbital).  Right Ear: External ear normal.  Left Ear: External ear normal.  Mouth/Throat: Oropharynx is clear and moist.  Eyes: EOM are normal. Pupils are equal, round, and reactive to light. No scleral icterus.  Neck: Normal range of motion. Neck supple.  Cardiovascular: Normal rate, regular rhythm, normal heart sounds and intact distal pulses.   Respiratory: Effort normal and breath sounds normal. She has no wheezes. She  exhibits tenderness (left sided).  GI: Soft. Bowel sounds are normal. There is no tenderness.  Musculoskeletal:       Left shoulder: She exhibits tenderness and bony tenderness.  Lymphadenopathy:    She has no cervical adenopathy.  Neurological: She is alert and oriented to person, place, and time. No cranial nerve deficit. GCS eye subscore is 4. GCS verbal subscore is 5. GCS motor subscore is 6.  Strength throughout left decreased and at baseline      Assessment/Plan Fall  Repeat ct scan for sdh per nsurgery, hold aggrenox Pain control for rib fx and manubrium, pulm toilet Will just start with ultram scheduled for her Ortho for clavicle fracture Hold pharm dvt proph, scds Restart home meds  Nyashia Raney, MD 09/25/2015, 5:03 PM

## 2015-09-25 NOTE — ED Notes (Signed)
Patient transported to CT 

## 2015-09-25 NOTE — Consult Note (Signed)
Orthopaedic Trauma Service Consultation  Reason for Consult: Left clavicle and rib fractures Referring Physician: Rolm Bookbinder, MD  Samantha Hudson is an 81 y.o. female.  HPI: Fall with left distal clavicle and left sided rib fractures; complex medical history including Parkinson's, intermittent encephalopathy, stroke with left sided weakness, left hip hemi in 2014. Walker dependent.  Recent medication difficulties. Denies other extremity pain. NO LOC. IN ICU with daughter at bedside. Subdural hematoma.  Past Medical History  Diagnosis Date  . Hyperlipemia   . IBS (irritable bowel syndrome)   . Osteoporosis   . Fibromyalgia   . Glaucoma     HAD LASER SURGERY - NO EYE DROPS REQUIRED  . Globus hystericus   . Fibromyalgia   . Meningioma ([place])   . CAD (coronary artery disease)     HEART STENT PLACED ABOUT 2000- NO LONGER SEES CARDIOLOGIST; NO C/O OF CHEST PAINS OR SOB  . Heart murmur     MVP SINCE THE 60'S - TAKES ATENOLOL -  . Anxiety   . Swelling     BILATERAL FEET AND LEGS - ONSET OF SWELLING APRIL 2014  . GERD (gastroesophageal reflux disease)   . Arthritis   . Fracture   . CVA (cerebral vascular accident) ([place])     STROKE 7 [place] TO REMOVE BENIGN MENINGIOMA - HAS LEFT SIDED WEAKNESS AND A LITTLE NUMBNESS  . Pain     "EXCRUCIATING PAIN" LEFT HIP - HAS A FRACTURE - IS CONFINED TO W/C AT HOME - NO WEIGHT BEARING LEFT HIP.  Marland Kitchen Parkinson's disease [facility])     Past Surgical History  Procedure Laterality Date  . Brain surgery      tumor removed  . Tonsillectomy    . Partial hysterectomy    . Glaucoma repair    . Cataract extraction    . Kidney surgery      left - HX ENLARGED LEFT KIDNEY ON XRAY - SURGERY WAS DONE ON URETER   . Colonoscopy    . Coronary angioplasty    . Hip arthroplasty Left 12/29/2012    Procedure: LEFT HIP HEMI ARTHROPLASTY ;  Surgeon: Mauri Pole, MD;  Location: [facility];  Service: Orthopedics;  Laterality: Left;     Family History  Problem Relation Age of Onset  . Liver disease Father   . Diabetes Father   . Coronary artery disease Mother     deseased  . Heart attack Mother   . Hyperlipidemia Mother   . Breast cancer Sister     x 2  . Cancer Sister     breast    Social History:  reports that she has never smoked. She has never used smokeless tobacco. She reports that she does not drink alcohol or use illicit drugs.  Allergies:  Allergies  Allergen Reactions  . Lactose Intolerance (Gi) Nausea And Vomiting  . Penicillins Rash    Has patient had a PCN reaction causing immediate rash, facial/tongue/throat swelling, SOB or lightheadedness with hypotension: NO Has patient had a PCN reaction causing severe rash involving mucus membranes or skin necrosis: NO Has patient had a PCN reaction that required hospitalization: NO Has patient had a PCN reaction occurring within the last 10 years: NO If all of the above answers are "NO", then may proceed with Cephalosporin use. Pt can tolerate cephalosporins    Medications: I have reviewed the patient's current medications.  Results for orders placed or performed during the hospital encounter of  09/25/15 (from the past 48 hour(s))  CBC with Differential/Platelet     Status: Abnormal   Collection Time: 09/25/15 11:56 AM  Result Value Ref Range   WBC 8.3 4.0 - 10.5 K/uL   RBC 3.74 (L) 3.87 - 5.11 MIL/uL   Hemoglobin 10.9 (L) 12.0 - 15.0 g/dL   HCT 35.1 (L) 36.0 - 46.0 %   MCV 93.9 78.0 - 100.0 fL   MCH 29.1 26.0 - 34.0 pg   MCHC 31.1 30.0 - 36.0 g/dL   RDW 13.4 11.5 - 15.5 %   Platelets 192 150 - 400 K/uL   Neutrophils Relative % 84 %   Neutro Abs 7.0 1.7 - 7.7 K/uL   Lymphocytes Relative 8 %   Lymphs Abs 0.7 0.7 - 4.0 K/uL   Monocytes Relative 7 %   Monocytes Absolute 0.6 0.1 - 1.0 K/uL   Eosinophils Relative 1 %   Eosinophils Absolute 0.1 0.0 - 0.7 K/uL   Basophils Relative 0 %   Basophils Absolute 0.0 0.0 - 0.1 K/uL  Comprehensive  metabolic panel     Status: Abnormal   Collection Time: 09/25/15 11:56 AM  Result Value Ref Range   Sodium 141 135 - 145 mmol/L   Potassium 4.4 3.5 - 5.1 mmol/L   Chloride 101 101 - 111 mmol/L   CO2 26 22 - 32 mmol/L   Glucose, Bld 102 (H) 65 - 99 mg/dL   BUN 20 6 - 20 mg/dL   Creatinine, Ser 0.97 0.44 - 1.00 mg/dL   Calcium 9.5 8.9 - 10.3 mg/dL   Total Protein 7.0 6.5 - 8.1 g/dL   Albumin 3.7 3.5 - 5.0 g/dL   AST 22 15 - 41 U/L   ALT <5 (L) 14 - 54 U/L   Alkaline Phosphatase 77 38 - 126 U/L   Total Bilirubin 0.9 0.3 - 1.2 mg/dL   GFR calc non Af Amer 55 (L) >60 mL/min   GFR calc Af Amer >60 >60 mL/min    Comment: (NOTE) The eGFR has been calculated using the CKD EPI equation. This calculation has not been validated in all clinical situations. eGFR's persistently <60 mL/min signify possible Chronic Kidney Disease.    Anion gap 14 5 - 15  Protime-INR     Status: None   Collection Time: 09/25/15 11:56 AM  Result Value Ref Range   Prothrombin Time 14.3 11.6 - 15.2 seconds   INR 1.09 0.00 - 1.49  APTT     Status: None   Collection Time: 09/25/15 11:56 AM  Result Value Ref Range   aPTT 36 24 - 37 seconds  I-stat troponin, ED     Status: None   Collection Time: 09/25/15 12:02 PM  Result Value Ref Range   Troponin i, poc 0.00 0.00 - 0.08 ng/mL   Comment 3            Comment: Due to the release kinetics of cTnI, a negative result within the first hours of the onset of symptoms does not rule out myocardial infarction with certainty. If myocardial infarction is still suspected, repeat the test at appropriate intervals.   Urinalysis, Routine w reflex microscopic (not at [facility])     Status: Abnormal   Collection Time: 09/25/15  1:21 PM  Result Value Ref Range   Color, Urine YELLOW YELLOW   APPearance HAZY (A) CLEAR   Specific Gravity, Urine 1.018 1.005 - 1.030   pH 7.5 5.0 - 8.0   Glucose, UA NEGATIVE NEGATIVE mg/dL  Hgb urine dipstick MODERATE (A) NEGATIVE   Bilirubin  Urine NEGATIVE NEGATIVE   Ketones, ur NEGATIVE NEGATIVE mg/dL   Protein, ur 30 (A) NEGATIVE mg/dL   Nitrite NEGATIVE NEGATIVE   Leukocytes, UA SMALL (A) NEGATIVE  Urine microscopic-add on     Status: Abnormal   Collection Time: 09/25/15  1:21 PM  Result Value Ref Range   Squamous Epithelial / LPF 0-5 (A) NONE SEEN   WBC, UA 6-30 0 - 5 WBC/hpf   RBC / HPF 6-30 0 - 5 RBC/hpf   Bacteria, UA FEW (A) NONE SEEN   Urine-Other AMORPHOUS URATES/PHOSPHATES   MRSA PCR Screening     Status: None   Collection Time: 09/25/15  6:29 PM  Result Value Ref Range   MRSA by PCR NEGATIVE NEGATIVE    Comment:        The GeneXpert MRSA Assay (FDA approved for NASAL specimens only), is one component of a comprehensive MRSA colonization surveillance program. It is not intended to diagnose MRSA infection nor to guide or monitor treatment for MRSA infections.     Dg Ribs Unilateral W/chest Left  09/25/2015  CLINICAL DATA:  Pain following fall EXAM: LEFT RIBS AND CHEST - 3+ VIEW COMPARISON:  Chest radiograph September 11, 2015 ; left shoulder Sep 25, 2015 FINDINGS: Frontal chest as well as oblique and cone-down lower rib images were obtained. There is no edema or consolidation. Heart size and pulmonary vascularity are normal. There is atherosclerotic calcification in the aorta. There is a fracture of the lateral left clavicle, better seen on left shoulder radiographs. There are displaced left-sided lateral rib fractures involving the left third, fourth, and fifth ribs. No pneumothorax or pleural effusion. IMPRESSION: Fractures of the left lateral third, fourth, and fifth ribs as well as the lateral left clavicle. No pneumothorax or pleural effusion. No edema or consolidation. Electronically Signed   By: Lowella Grip III M.D.   On: 09/25/2015 12:43   Ct Head Wo Contrast  09/25/2015  CLINICAL DATA:  Patient status post fall. Left-sided weakness. Left eye bruising and swelling. EXAM: CT HEAD WITHOUT CONTRAST CT  MAXILLOFACIAL WITHOUT CONTRAST CT CERVICAL SPINE WITHOUT CONTRAST TECHNIQUE: Multidetector CT imaging of the head, cervical spine, and maxillofacial structures were performed using the standard protocol without intravenous contrast. Multiplanar CT image reconstructions of the cervical spine and maxillofacial structures were also generated. COMPARISON:  CT brain 09/16/2015 FINDINGS: CT HEAD FINDINGS Ventricles and sulci are appropriate for patient's age. Small amount of extra-axial blood products overlying the left cerebral hemisphere (image 18; series 201) measuring 4 mm in thickness, favored to be subdural in etiology. No evidence for acute cortically based infarct or mass lesion or mass-effect. Large area of encephalomalacia throughout the right MCA distribution, grossly unchanged from prior. Orbits are unremarkable. Left for a orbital soft tissue swelling. Soft tissue hematoma overlying the left calvarium. Right pterional craniotomy. Mastoid air cells are unremarkable. No displaced skull fracture. CT MAXILLOFACIAL FINDINGS Left periorbital soft tissue swelling and hematoma formation. Paranasal sinuses are unremarkable. No evidence for acute maxillofacial fracture. The nasal bone, zygomatic arches and pterygoid plates are intact. Maxilla and mandible are intact. Orbital walls are intact. CT CERVICAL SPINE FINDINGS Preservation of the vertebral body and intervertebral disc space heights. Normal anatomic alignment. No evidence for acute cervical spine fracture. Mild facet degenerative changes left C4-5. Prevertebral soft tissues are unremarkable. Visualized thyroid is unremarkable. Biapical pleural parenchymal thickening and scarring. There is a probable acute fracture of the superior aspect of  the sternum, incompletely included on current exam (image 91; series 41). Nondisplaced left anterior first rib fracture. Nondisplaced distal left clavicle fracture. IMPRESSION: Small amount of extra-axial blood products  overlying the left cerebral hemisphere measuring up to 4 mm in thickness, favored to be subdural in etiology. Sternal fracture, incompletely included on current evaluation. Nondisplaced left anterior first rib fracture. No acute maxillofacial fracture. No acute cervical spine fracture. Re- demonstrated large area of encephalomalacia within the right MCA territory. Left periorbital soft tissue swelling/hematoma formation. These results were called by telephone at the time of interpretation on 09/25/2015 at 1:24 pm to Dr. Julianne Rice , who verbally acknowledged these results. Electronically Signed   By: Lovey Newcomer M.D.   On: 09/25/2015 13:47   Ct Chest W Contrast  09/25/2015  CLINICAL DATA:  Pain following fall EXAM: CT CHEST WITH CONTRAST TECHNIQUE: Multidetector CT imaging of the chest was performed during intravenous contrast administration. CONTRAST:  17m ISOVUE-300 IOPAMIDOL (ISOVUE-300) INJECTION 61% COMPARISON:  Chest and ribs Sep 25, 2015 ; chest CT March 02, 2015 FINDINGS: Mediastinum/Lymph Nodes: There is no appreciable mediastinal hematoma. There is no thoracic aortic aneurysm or dissection. The visualized great vessels appear intact and unremarkable. There is atherosclerotic calcification in the aorta. There is no demonstrable pulmonary embolus. Pericardium is not thickened. There is ventricular hypertrophy. There are foci of coronary artery calcification. Thyroid appears normal. There is no appreciable thoracic adenopathy. Lungs/Pleura: There is scarring in each lung apex. There is no appreciable pneumothorax or parenchymal lung contusion. There is bibasilar patchy atelectasis. There is also mild scarring in the lung bases with mild lower lobe bronchiectatic change on the left. Upper abdomen: In the visualized upper abdomen, there is atherosclerotic calcification in the aorta. Visualized upper abdominal structures otherwise appear unremarkable. Musculoskeletal: There is a fracture of the mid the  distal manubrium with adjacent hematoma in this area. There are fractures, displaced, of the anterior left third, fourth, and fifth ribs. Fracture of the lateral left clavicle is better seen on radiography. There is stable marked collapse of the T7 vertebral body with moderate wedging of the T8 vertebral body, stable. No retropulsion of bone seen. IMPRESSION: Comminuted manubrial fracture with adjacent soft tissue hematoma, not causing appreciable mass effect on the anterior mediastinum. Fractures of the anterior left third, fourth, and fifth ribs with mild displacement in these areas. No pneumothorax or parenchymal lung contusion. Fracture lateral left clavicle better seen on radiography. Bibasilar atelectasis. Apical lung scarring bilaterally. Mild left lower lobe bronchiectatic change. No demonstrable mediastinal hematoma. No vascular disruption evident. Atherosclerotic calcification is noted in the aorta. There are foci of coronary artery calcification. Stable marked collapse of the T7 vertebral body. Stable moderate wedging of the T8 vertebral body. Electronically Signed   By: WLowella GripIII M.D.   On: 09/25/2015 15:23   Ct Cervical Spine Wo Contrast  09/25/2015  CLINICAL DATA:  Patient status post fall. Left-sided weakness. Left eye bruising and swelling. EXAM: CT HEAD WITHOUT CONTRAST CT MAXILLOFACIAL WITHOUT CONTRAST CT CERVICAL SPINE WITHOUT CONTRAST TECHNIQUE: Multidetector CT imaging of the head, cervical spine, and maxillofacial structures were performed using the standard protocol without intravenous contrast. Multiplanar CT image reconstructions of the cervical spine and maxillofacial structures were also generated. COMPARISON:  CT brain 09/16/2015 FINDINGS: CT HEAD FINDINGS Ventricles and sulci are appropriate for patient's age. Small amount of extra-axial blood products overlying the left cerebral hemisphere (image 18; series 201) measuring 4 mm in thickness, favored to be subdural in  etiology. No evidence for acute cortically based infarct or mass lesion or mass-effect. Large area of encephalomalacia throughout the right MCA distribution, grossly unchanged from prior. Orbits are unremarkable. Left for a orbital soft tissue swelling. Soft tissue hematoma overlying the left calvarium. Right pterional craniotomy. Mastoid air cells are unremarkable. No displaced skull fracture. CT MAXILLOFACIAL FINDINGS Left periorbital soft tissue swelling and hematoma formation. Paranasal sinuses are unremarkable. No evidence for acute maxillofacial fracture. The nasal bone, zygomatic arches and pterygoid plates are intact. Maxilla and mandible are intact. Orbital walls are intact. CT CERVICAL SPINE FINDINGS Preservation of the vertebral body and intervertebral disc space heights. Normal anatomic alignment. No evidence for acute cervical spine fracture. Mild facet degenerative changes left C4-5. Prevertebral soft tissues are unremarkable. Visualized thyroid is unremarkable. Biapical pleural parenchymal thickening and scarring. There is a probable acute fracture of the superior aspect of the sternum, incompletely included on current exam (image 91; series 41). Nondisplaced left anterior first rib fracture. Nondisplaced distal left clavicle fracture. IMPRESSION: Small amount of extra-axial blood products overlying the left cerebral hemisphere measuring up to 4 mm in thickness, favored to be subdural in etiology. Sternal fracture, incompletely included on current evaluation. Nondisplaced left anterior first rib fracture. No acute maxillofacial fracture. No acute cervical spine fracture. Re- demonstrated large area of encephalomalacia within the right MCA territory. Left periorbital soft tissue swelling/hematoma formation. These results were called by telephone at the time of interpretation on 09/25/2015 at 1:24 pm to Dr. Julianne Rice , who verbally acknowledged these results. Electronically Signed   By: Lovey Newcomer  M.D.   On: 09/25/2015 13:47   Dg Shoulder Left  09/25/2015  CLINICAL DATA:  Pain following fall EXAM: LEFT SHOULDER - 2+ VIEW COMPARISON:  None. FINDINGS: Frontal and Y scapular images were obtained. There is a fracture of the distal left clavicle with inferior displacement and angulation distally. There is approximately 1.5 cm of overlapping fracture fragments in the area of the lateral left clavicle. No other fracture. No dislocation. There is mild glenohumeral joint space narrowing. No erosive change. Visualized left lung clear. IMPRESSION: Displaced fracture distal left clavicle with approximately 1.5 cm of overriding of fracture fragments. No other fracture. No dislocation. Mild narrowing glenohumeral joint. Electronically Signed   By: Lowella Grip III M.D.   On: 09/25/2015 12:40   Ct Maxillofacial Wo Cm  09/25/2015  CLINICAL DATA:  Patient status post fall. Left-sided weakness. Left eye bruising and swelling. EXAM: CT HEAD WITHOUT CONTRAST CT MAXILLOFACIAL WITHOUT CONTRAST CT CERVICAL SPINE WITHOUT CONTRAST TECHNIQUE: Multidetector CT imaging of the head, cervical spine, and maxillofacial structures were performed using the standard protocol without intravenous contrast. Multiplanar CT image reconstructions of the cervical spine and maxillofacial structures were also generated. COMPARISON:  CT brain 09/16/2015 FINDINGS: CT HEAD FINDINGS Ventricles and sulci are appropriate for patient's age. Small amount of extra-axial blood products overlying the left cerebral hemisphere (image 18; series 201) measuring 4 mm in thickness, favored to be subdural in etiology. No evidence for acute cortically based infarct or mass lesion or mass-effect. Large area of encephalomalacia throughout the right MCA distribution, grossly unchanged from prior. Orbits are unremarkable. Left for a orbital soft tissue swelling. Soft tissue hematoma overlying the left calvarium. Right pterional craniotomy. Mastoid air cells are  unremarkable. No displaced skull fracture. CT MAXILLOFACIAL FINDINGS Left periorbital soft tissue swelling and hematoma formation. Paranasal sinuses are unremarkable. No evidence for acute maxillofacial fracture. The nasal bone, zygomatic arches and pterygoid plates are intact.  Maxilla and mandible are intact. Orbital walls are intact. CT CERVICAL SPINE FINDINGS Preservation of the vertebral body and intervertebral disc space heights. Normal anatomic alignment. No evidence for acute cervical spine fracture. Mild facet degenerative changes left C4-5. Prevertebral soft tissues are unremarkable. Visualized thyroid is unremarkable. Biapical pleural parenchymal thickening and scarring. There is a probable acute fracture of the superior aspect of the sternum, incompletely included on current exam (image 91; series 41). Nondisplaced left anterior first rib fracture. Nondisplaced distal left clavicle fracture. IMPRESSION: Small amount of extra-axial blood products overlying the left cerebral hemisphere measuring up to 4 mm in thickness, favored to be subdural in etiology. Sternal fracture, incompletely included on current evaluation. Nondisplaced left anterior first rib fracture. No acute maxillofacial fracture. No acute cervical spine fracture. Re- demonstrated large area of encephalomalacia within the right MCA territory. Left periorbital soft tissue swelling/hematoma formation. These results were called by telephone at the time of interpretation on 09/25/2015 at 1:24 pm to Dr. Julianne Rice , who verbally acknowledged these results. Electronically Signed   By: Lovey Newcomer M.D.   On: 09/25/2015 13:47    ROS multiple, including left sided weakness from stroke Blood pressure 150/77, pulse 81, temperature 98.8 F (37.1 C), temperature source Oral, resp. rate 18, height _0  (1.651 m), weight 101 lb 13.6 oz (46.2 kg), SpO2 94 %. Physical Exam Responsive and can answer simple questions. LArge left periorbital  ecchymosis. LUEx  Shoulder ecchymosis, tenderness around distal clavicle  elbow, wrist, digits- no skin wounds, nontender, no instability, no blocks to motion  Sens  Ax/R/M/U intact  Mot   Ax/ R/ PIN/ M/ AIN/ U intact  Rad 2+  Sling adjusted to remove strap around the torso Pelvis--no traumatic wounds or rash, no ecchymosis, stable to manual stress, nontender LLE No traumatic wounds, but several old areas of small ecchymosis, no rash  Nontender  No effusions  Sens DPN, SPN, TN intact  F/ E toes and ankles  DP 2+, No significant edema RLE No traumatic wounds, but several old areas of small ecchymosis, no rash  Nontender  No effusions  Sens DPN, SPN, TN intact  F/ E toes and ankles  DP 2+, No significant edema   Assessment/Plan: Left distal clavicle fracture 1. OT, PT for gentle pendulum, FROM elbow wrist and hand; may WBAT with walker when pain allows 2. Sling for comfort when OOB or upright in chair 3. Will re check xrays in a few days; higher risk of nonunion with this pattern but hopeful with nonsurgical treatment  Altamese Cottonwood, MD [facility] [phone] (p)   09/25/2015  11:58 PM

## 2015-09-25 NOTE — Consult Note (Signed)
CC:  Chief Complaint  Patient presents with  . Fall    HPI: Samantha Hudson is a 81 y.o. female brought to the ED by EMS after falling in the kitchen this am. She has a history of Parkinson's, chronic pain, and right craniotomy for meningioma and has baseline difficulty walking. She apparently lost balance and fell today, hitting the left side of her head. She does seem to recall the fall. She is actually complaining of minimal pain currently. She denies new N/T or weakness.  PMH: Past Medical History  Diagnosis Date  . Hyperlipemia   . IBS (irritable bowel syndrome)   . Osteoporosis   . Fibromyalgia   . Glaucoma     HAD LASER SURGERY - NO EYE DROPS REQUIRED  . Globus hystericus   . Fibromyalgia   . Meningioma ([place])   . CAD (coronary artery disease)     HEART STENT PLACED ABOUT 2000- NO LONGER SEES CARDIOLOGIST; NO C/O OF CHEST PAINS OR SOB  . Heart murmur     MVP SINCE THE 60'S - TAKES ATENOLOL -  . Anxiety   . Swelling     BILATERAL FEET AND LEGS - ONSET OF SWELLING APRIL 2014  . GERD (gastroesophageal reflux disease)   . Arthritis   . Fracture   . CVA (cerebral vascular accident) ([place])     STROKE 7 [place] TO REMOVE BENIGN MENINGIOMA - HAS LEFT SIDED WEAKNESS AND A LITTLE NUMBNESS  . Pain     "EXCRUCIATING PAIN" LEFT HIP - HAS A FRACTURE - IS CONFINED TO W/C AT HOME - NO WEIGHT BEARING LEFT HIP.  Marland Kitchen Parkinson's disease ([place])     PSH: Past Surgical History  Procedure Laterality Date  . Brain surgery      tumor removed  . Tonsillectomy    . Partial hysterectomy    . Glaucoma repair    . Cataract extraction    . Kidney surgery      left - HX ENLARGED LEFT KIDNEY ON XRAY - SURGERY WAS DONE ON URETER   . Colonoscopy    . Coronary angioplasty    . Hip arthroplasty Left 12/29/2012    Procedure: LEFT HIP HEMI ARTHROPLASTY ;  Surgeon: Mauri Pole, MD;  Location: [facility];  Service: Orthopedics;  Laterality: Left;    SH: Social History   Substance Use Topics  . Smoking status: Never Smoker   . Smokeless tobacco: Never Used  . Alcohol Use: No    MEDS: Prior to Admission medications   Medication Sig Start Date End Date Taking? Authorizing Provider  atenolol (TENORMIN) 25 MG tablet Take 12.5 mg by mouth 2 (two) times daily.   Yes Historical Provider, MD  atorvastatin (LIPITOR) 10 MG tablet TAKE 1 TABLET AT BEDTIME. 06/23/15  Yes Kathyrn Drown, MD  calcium-vitamin D (OSCAL WITH D) 500-200 MG-UNIT per tablet Take 1 tablet by mouth 3 (three) times daily.    Yes Historical Provider, MD  carbidopa-levodopa (SINEMET IR) 25-100 MG tablet TAKE (2) TABLETS THREE TIMES DAILY. 07/18/15  Yes Marcial Pacas, MD  cholecalciferol (VITAMIN D) 400 UNITS TABS tablet Take 400 Units by mouth daily.   Yes Historical Provider, MD  dipyridamole-aspirin (AGGRENOX) 200-25 MG 12hr capsule Take 1 capsule by mouth 2 (two) times daily.   Yes Historical Provider, MD  DULoxetine (CYMBALTA) 30 MG capsule TAKE (1) CAPSULE DAILY. 08/22/15  Yes Kathyrn Drown, MD  gabapentin (NEURONTIN) 100 MG capsule TAKE 2  CAPSULE IN THE MORNING AND 1 CAPSULE IN THE AFTERNNON Patient taking differently: TAKE 2 CAPSULE IN THE MORNING AND 1 CAPSULE IN THE AFTERnoon, 06/23/15  Yes Kathyrn Drown, MD  gabapentin (NEURONTIN) 400 MG capsule Take 400 mg by mouth at bedtime.   Yes Historical Provider, MD  hydroxypropyl methylcellulose / hypromellose (ISOPTO TEARS / GONIOVISC) 2.5 % ophthalmic solution Place 1 drop into both eyes as needed for dry eyes. Reported on 06/20/2015   Yes Historical Provider, MD  LamoTRIgine XR 200 MG TB24 Take 1 tablet (200 mg total) by mouth at bedtime. 09/18/15  Yes Rexene Alberts, MD  Pimavanserin Tartrate (NUPLAZID) 17 MG TABS Take 34 mg by mouth daily. 09/19/15  Yes Marcial Pacas, MD  potassium chloride (K-DUR) 10 MEQ tablet TAKE (1) TABLET TWICE A DAY. 06/23/15  Yes Kathyrn Drown, MD  QUEtiapine (SEROQUEL) 25 MG tablet Take one to 4 tabs po qhs Patient taking differently:  Take 25 mg by mouth at bedtime. Take one to 4 tabs po qhs 09/19/15  Yes Marcial Pacas, MD  ranitidine (ZANTAC) 300 MG tablet TAKE 1 TABLET DAILY AS DIRECTED Patient taking differently: TAKE 1 TABLET DAILY 06/23/15  Yes Kathyrn Drown, MD  torsemide (DEMADEX) 20 MG tablet TAKE 1/2 TABLET EACH MORNING AS NEEDED FOR FLUID Patient taking differently: Take 20 mg by mouth daily as needed. TAKE 1/2 TABLET EACH MORNING AS NEEDED FOR FLUID 09/18/15  Yes Rexene Alberts, MD  ALPRAZolam Duanne Moron) 0.25 MG tablet Take 1 tablet (0.25 mg total) by mouth at bedtime as needed for anxiety. 08/17/15   Marcial Pacas, MD  DULoxetine (CYMBALTA) 30 MG capsule TAKE (1) CAPSULE DAILY. Patient not taking: Reported on 09/25/2015 09/20/15   Mikey Kirschner, MD  polyethylene glycol powder (GLYCOLAX/MIRALAX) powder Take 17 g by mouth 2 (two) times daily. Patient taking differently: Take 17 g by mouth daily as needed for mild constipation.  01/01/13   Danae Orleans, PA-C  polyethylene glycol powder (GLYCOLAX/MIRALAX) powder Take 17 g by mouth daily as needed for mild constipation or moderate constipation.    Historical Provider, MD  traMADol (ULTRAM) 50 MG tablet Take 1 tablet (50 mg total) by mouth every 6 (six) hours as needed for moderate pain. 04/21/15   Kathyrn Drown, MD  zoledronic acid (RECLAST) 5 MG/100ML SOLN injection Inject 5 mg into the vein once. Takes yearly in December    Historical Provider, MD    ALLERGY: Allergies  Allergen Reactions  . Lactose Intolerance (Gi) Nausea And Vomiting  . Penicillins Rash    Has patient had a PCN reaction causing immediate rash, facial/tongue/throat swelling, SOB or lightheadedness with hypotension: NO Has patient had a PCN reaction causing severe rash involving mucus membranes or skin necrosis: NO Has patient had a PCN reaction that required hospitalization: NO Has patient had a PCN reaction occurring within the last 10 years: NO If all of the above answers are "NO", then may proceed with  Cephalosporin use. Pt can tolerate cephalosporins    ROS: ROS  NEUROLOGIC EXAM: Awake, alert, oriented Large left periorbital eccymosis Memory and concentration grossly intact Speech fluent, appropriate CN grossly intact Motor exam: Upper Extremities Deltoid Bicep Tricep Grip  Right 5/5 5/5 5/5 5/5  Left 4/5 -->       Lower Extremity IP Quad PF DF EHL  Right 5/5 5/5 5/5 5/5 5/5  Left 4/5 -->       Sensation grossly intact to LT  IMGAING: CTH demonstrates a very small left  SDH without mass effect. There is old right craniotomy and significant right frontotemporal encephalomalacia.   IMPRESSION: - 81 y.o. female s/p fall with very small left SDH, appears to be at neurologic baseline  PLAN: - Keppra 500mg  PO BID x 7d - Can repeat [facility] tomorrow am

## 2015-09-25 NOTE — ED Notes (Signed)
Family at bedside. 

## 2015-09-25 NOTE — ED Notes (Signed)
MD at bedside. 

## 2015-09-25 NOTE — ED Notes (Signed)
Pt arrived via [facility]. Pt is coming from home. Pt had a fall from standing position onto linoleum floors. No LOC reported after the fall. Pt is A/Ox4 on arrival with NAD. EMS report that pt c/o pain to mid sternum, L shoulder, L ribs, L eye, and R hip. It is reported that pt has L sided weakness which is the pt's baseline from a past CVA.

## 2015-09-25 NOTE — ED Provider Notes (Signed)
CSN: [id number]     Arrival date & time 09/25/15  1123 History   First MD Initiated Contact with Patient 09/25/15 1123     Chief Complaint  Patient presents with  . Fall     (Consider location/radiation/quality/duration/timing/severity/associated sxs/prior Treatment) HPI Patient presents by EMS from home after stating fall from standing. Patient states that she lost her balance while walking with walker. [place] forward onto the floor. She struck the left side of her body. Complaining of left rib pain. No known loss of consciousness. Patient with multiple falls recently. History of previous stroke with left-sided weakness and Parkinson's disease. Denies any new weakness. Past Medical History  Diagnosis Date  . Hyperlipemia   . IBS (irritable bowel syndrome)   . Osteoporosis   . Fibromyalgia   . Glaucoma     HAD LASER SURGERY - NO EYE DROPS REQUIRED  . Globus hystericus   . Fibromyalgia   . Meningioma ([place])   . CAD (coronary artery disease)     HEART STENT PLACED ABOUT 2000- NO LONGER SEES CARDIOLOGIST; NO C/O OF CHEST PAINS OR SOB  . Heart murmur     MVP SINCE THE 60'S - TAKES ATENOLOL -  . Anxiety   . Swelling     BILATERAL FEET AND LEGS - ONSET OF SWELLING APRIL 2014  . GERD (gastroesophageal reflux disease)   . Arthritis   . Fracture   . CVA (cerebral vascular accident) ([place])     STROKE [address] [place] TO REMOVE BENIGN MENINGIOMA - HAS LEFT SIDED WEAKNESS AND A LITTLE NUMBNESS  . Pain     "EXCRUCIATING PAIN" LEFT HIP - HAS A FRACTURE - IS CONFINED TO W/C AT HOME - NO WEIGHT BEARING LEFT HIP.  Marland Kitchen Parkinson's disease [facility])    Past Surgical History  Procedure Laterality Date  . Brain surgery      tumor removed  . Tonsillectomy    . Partial hysterectomy    . Glaucoma repair    . Cataract extraction    . Kidney surgery      left - HX ENLARGED LEFT KIDNEY ON XRAY - SURGERY WAS DONE ON URETER   . Colonoscopy    . Coronary angioplasty    . Hip arthroplasty  Left 12/29/2012    Procedure: LEFT HIP HEMI ARTHROPLASTY ;  Surgeon: Mauri Pole, MD;  Location: [facility];  Service: Orthopedics;  Laterality: Left;   Family History  Problem Relation Age of Onset  . Liver disease Father   . Diabetes Father   . Coronary artery disease Mother     deseased  . Heart attack Mother   . Hyperlipidemia Mother   . Breast cancer Sister     x 2  . Cancer Sister     breast   Social History  Substance Use Topics  . Smoking status: Never Smoker   . Smokeless tobacco: Never Used  . Alcohol Use: No   OB History    Gravida Para Term Preterm AB TAB SAB Ectopic Multiple Living   2 2 2       2      Review of Systems  Constitutional: Negative for fever and chills.  Respiratory: Negative for shortness of breath.   Cardiovascular: Positive for chest pain. Negative for leg swelling.  Gastrointestinal: Negative for nausea, vomiting and abdominal pain.  Genitourinary: Negative for dysuria and flank pain.  Musculoskeletal: Positive for arthralgias and gait problem. Negative for myalgias, back pain and  neck pain.  Skin: Positive for wound.  Neurological: Positive for weakness (left-sided). Negative for dizziness, light-headedness, numbness and headaches.  All other systems reviewed and are negative.     Allergies  Lactose intolerance (gi) and Penicillins  Home Medications   Prior to Admission medications   Medication Sig Start Date End Date Taking? Authorizing Provider  atenolol (TENORMIN) 25 MG tablet Take 12.5 mg by mouth 2 (two) times daily.   Yes Historical Provider, MD  atorvastatin (LIPITOR) 10 MG tablet TAKE 1 TABLET AT BEDTIME. 06/23/15  Yes Kathyrn Drown, MD  calcium-vitamin D (OSCAL WITH D) 500-200 MG-UNIT per tablet Take 1 tablet by mouth 3 (three) times daily.    Yes Historical Provider, MD  carbidopa-levodopa (SINEMET IR) 25-100 MG tablet TAKE (2) TABLETS THREE TIMES DAILY. 07/18/15  Yes Marcial Pacas, MD  cholecalciferol (VITAMIN D) 400 UNITS TABS  tablet Take 400 Units by mouth daily.   Yes Historical Provider, MD  dipyridamole-aspirin (AGGRENOX) 200-25 MG 12hr capsule Take 1 capsule by mouth 2 (two) times daily.   Yes Historical Provider, MD  DULoxetine (CYMBALTA) 30 MG capsule TAKE (1) CAPSULE DAILY. 08/22/15  Yes Kathyrn Drown, MD  gabapentin (NEURONTIN) 100 MG capsule TAKE 2 CAPSULE IN THE MORNING AND 1 CAPSULE IN THE AFTERNNON Patient taking differently: TAKE 2 CAPSULE IN THE MORNING AND 1 CAPSULE IN THE AFTERnoon, 06/23/15  Yes Kathyrn Drown, MD  gabapentin (NEURONTIN) 400 MG capsule Take 400 mg by mouth at bedtime.   Yes Historical Provider, MD  hydroxypropyl methylcellulose / hypromellose (ISOPTO TEARS / GONIOVISC) 2.5 % ophthalmic solution Place 1 drop into both eyes as needed for dry eyes. Reported on 06/20/2015   Yes Historical Provider, MD  LamoTRIgine XR 200 MG TB24 Take 1 tablet (200 mg total) by mouth at bedtime. 09/18/15  Yes Rexene Alberts, MD  Pimavanserin Tartrate (NUPLAZID) 17 MG TABS Take 34 mg by mouth daily. 09/19/15  Yes Marcial Pacas, MD  potassium chloride (K-DUR) 10 MEQ tablet TAKE (1) TABLET TWICE A DAY. 06/23/15  Yes Kathyrn Drown, MD  QUEtiapine (SEROQUEL) 25 MG tablet Take one to 4 tabs po qhs Patient taking differently: Take 25 mg by mouth at bedtime. Take one to 4 tabs po qhs 09/19/15  Yes Marcial Pacas, MD  ranitidine (ZANTAC) 300 MG tablet TAKE 1 TABLET DAILY AS DIRECTED Patient taking differently: TAKE 1 TABLET DAILY 06/23/15  Yes Kathyrn Drown, MD  torsemide (DEMADEX) 20 MG tablet TAKE 1/2 TABLET EACH MORNING AS NEEDED FOR FLUID Patient taking differently: Take 20 mg by mouth daily as needed. TAKE 1/2 TABLET EACH MORNING AS NEEDED FOR FLUID 09/18/15  Yes Rexene Alberts, MD  ALPRAZolam Duanne Moron) 0.25 MG tablet Take 1 tablet (0.25 mg total) by mouth at bedtime as needed for anxiety. 08/17/15   Marcial Pacas, MD  DULoxetine (CYMBALTA) 30 MG capsule TAKE (1) CAPSULE DAILY. Patient not taking: Reported on 09/25/2015 09/20/15   Mikey Kirschner, MD  polyethylene glycol powder (GLYCOLAX/MIRALAX) powder Take 17 g by mouth 2 (two) times daily. Patient taking differently: Take 17 g by mouth daily as needed for mild constipation.  01/01/13   Danae Orleans, PA-C  polyethylene glycol powder (GLYCOLAX/MIRALAX) powder Take 17 g by mouth daily as needed for mild constipation or moderate constipation.    Historical Provider, MD  traMADol (ULTRAM) 50 MG tablet Take 1 tablet (50 mg total) by mouth every 6 (six) hours as needed for moderate pain. 04/21/15   Kathyrn Drown,  MD  zoledronic acid (RECLAST) 5 MG/100ML SOLN injection Inject 5 mg into the vein once. Takes yearly in December    Historical Provider, MD   BP 156/75 mmHg  Pulse 88  Temp(Src) 97.8 F (36.6 C) (Oral)  Resp 24  Ht 5\' 5"  (1.651 m)  Wt 103 lb (46.72 kg)  BMI 17.14 kg/m2  SpO2 97% Physical Exam  Constitutional: She is oriented to person, place, and time. She appears well-developed and well-nourished. No distress.  HENT:  Head: Normocephalic.  Mouth/Throat: Oropharynx is clear and moist.  Left periorbital hematoma with laceration over the left eyebrow. Midface is stable. No malocclusion. No intraoral trauma.  Eyes: EOM are normal. Pupils are equal, round, and reactive to light.  Neck: Normal range of motion. Neck supple.  No posterior midline cervical tenderness to palpation.  Cardiovascular: Normal rate and regular rhythm.   Pulmonary/Chest: Effort normal and breath sounds normal. No respiratory distress. She has no wheezes. She has no rales. She exhibits tenderness (diffuse left chest wall tenderness. Appears most pronounced over the lateral surface.).  Abdominal: Soft. Bowel sounds are normal. She exhibits no distension and no mass. There is no tenderness. There is no rebound and no guarding.  Musculoskeletal: Normal range of motion. She exhibits no edema or tenderness.  Current will rigidity throughout. Patient appears to have pain with range of motion of the right  shoulder. Lower extremities without length discrepancy. No pelvic instability or pain. Distal pulses are equal and intact. No midline thoracic or lumbar tenderness.  Neurological: She is alert and oriented to person, place, and time.  4/5 motor in left upper and left lower extremity compared to right. Sensation is grossly intact. Extremities appear contracted and rigid.  Skin: Skin is warm and dry. No rash noted. No erythema.  Contusion to the left shoulder and right wrist. Lacerations purposely noted over the left eyebrow with surrounding periorbital hematoma.  Psychiatric: She has a normal mood and affect. Her behavior is normal.  Nursing note and vitals reviewed.   ED Course  Procedures (including critical care time) Labs Review Labs Reviewed  CBC WITH DIFFERENTIAL/PLATELET - Abnormal; Notable for the following:    RBC 3.74 (*)    Hemoglobin 10.9 (*)    HCT 35.1 (*)    All other components within normal limits  COMPREHENSIVE METABOLIC PANEL - Abnormal; Notable for the following:    Glucose, Bld 102 (*)    ALT <5 (*)    GFR calc non Af Amer 55 (*)    All other components within normal limits  URINALYSIS, ROUTINE W REFLEX MICROSCOPIC (NOT AT [facility]) - Abnormal; Notable for the following:    APPearance HAZY (*)    Hgb urine dipstick MODERATE (*)    Protein, ur 30 (*)    Leukocytes, UA SMALL (*)    All other components within normal limits  URINE MICROSCOPIC-ADD ON - Abnormal; Notable for the following:    Squamous Epithelial / LPF 0-5 (*)    Bacteria, UA FEW (*)    All other components within normal limits  PROTIME-INR  APTT  I-STAT TROPOININ, ED    Imaging Review Dg Ribs Unilateral W/chest Left  09/25/2015  CLINICAL DATA:  Pain following fall EXAM: LEFT RIBS AND CHEST - 3+ VIEW COMPARISON:  Chest radiograph September 11, 2015 ; left shoulder Sep 25, 2015 FINDINGS: Frontal chest as well as oblique and cone-down lower rib images were obtained. There is no edema or consolidation. Heart  size and pulmonary vascularity  are normal. There is atherosclerotic calcification in the aorta. There is a fracture of the lateral left clavicle, better seen on left shoulder radiographs. There are displaced left-sided lateral rib fractures involving the left third, fourth, and fifth ribs. No pneumothorax or pleural effusion. IMPRESSION: Fractures of the left lateral third, fourth, and fifth ribs as well as the lateral left clavicle. No pneumothorax or pleural effusion. No edema or consolidation. Electronically Signed   By: Lowella Grip III M.D.   On: 09/25/2015 12:43   Ct Head Wo Contrast  09/25/2015  CLINICAL DATA:  Patient status post fall. Left-sided weakness. Left eye bruising and swelling. EXAM: CT HEAD WITHOUT CONTRAST CT MAXILLOFACIAL WITHOUT CONTRAST CT CERVICAL SPINE WITHOUT CONTRAST TECHNIQUE: Multidetector CT imaging of the head, cervical spine, and maxillofacial structures were performed using the standard protocol without intravenous contrast. Multiplanar CT image reconstructions of the cervical spine and maxillofacial structures were also generated. COMPARISON:  CT brain 09/16/2015 FINDINGS: CT HEAD FINDINGS Ventricles and sulci are appropriate for patient's age. Small amount of extra-axial blood products overlying the left cerebral hemisphere (image 18; series 201) measuring 4 mm in thickness, favored to be subdural in etiology. No evidence for acute cortically based infarct or mass lesion or mass-effect. Large area of encephalomalacia throughout the right MCA distribution, grossly unchanged from prior. Orbits are unremarkable. Left for a orbital soft tissue swelling. Soft tissue hematoma overlying the left calvarium. Right pterional craniotomy. Mastoid air cells are unremarkable. No displaced skull fracture. CT MAXILLOFACIAL FINDINGS Left periorbital soft tissue swelling and hematoma formation. Paranasal sinuses are unremarkable. No evidence for acute maxillofacial fracture. The nasal bone,  zygomatic arches and pterygoid plates are intact. Maxilla and mandible are intact. Orbital walls are intact. CT CERVICAL SPINE FINDINGS Preservation of the vertebral body and intervertebral disc space heights. Normal anatomic alignment. No evidence for acute cervical spine fracture. Mild facet degenerative changes left C4-5. Prevertebral soft tissues are unremarkable. Visualized thyroid is unremarkable. Biapical pleural parenchymal thickening and scarring. There is a probable acute fracture of the superior aspect of the sternum, incompletely included on current exam (image 91; series 41). Nondisplaced left anterior first rib fracture. Nondisplaced distal left clavicle fracture. IMPRESSION: Small amount of extra-axial blood products overlying the left cerebral hemisphere measuring up to 4 mm in thickness, favored to be subdural in etiology. Sternal fracture, incompletely included on current evaluation. Nondisplaced left anterior first rib fracture. No acute maxillofacial fracture. No acute cervical spine fracture. Re- demonstrated large area of encephalomalacia within the right MCA territory. Left periorbital soft tissue swelling/hematoma formation. These results were called by telephone at the time of interpretation on 09/25/2015 at 1:24 pm to Dr. Julianne Rice , who verbally acknowledged these results. Electronically Signed   By: Lovey Newcomer M.D.   On: 09/25/2015 13:47   Ct Chest W Contrast  09/25/2015  CLINICAL DATA:  Pain following fall EXAM: CT CHEST WITH CONTRAST TECHNIQUE: Multidetector CT imaging of the chest was performed during intravenous contrast administration. CONTRAST:  30mL ISOVUE-300 IOPAMIDOL (ISOVUE-300) INJECTION 61% COMPARISON:  Chest and ribs Sep 25, 2015 ; chest CT March 02, 2015 FINDINGS: Mediastinum/Lymph Nodes: There is no appreciable mediastinal hematoma. There is no thoracic aortic aneurysm or dissection. The visualized great vessels appear intact and unremarkable. There is  atherosclerotic calcification in the aorta. There is no demonstrable pulmonary embolus. Pericardium is not thickened. There is ventricular hypertrophy. There are foci of coronary artery calcification. Thyroid appears normal. There is no appreciable thoracic adenopathy. Lungs/Pleura: There is scarring  in each lung apex. There is no appreciable pneumothorax or parenchymal lung contusion. There is bibasilar patchy atelectasis. There is also mild scarring in the lung bases with mild lower lobe bronchiectatic change on the left. Upper abdomen: In the visualized upper abdomen, there is atherosclerotic calcification in the aorta. Visualized upper abdominal structures otherwise appear unremarkable. Musculoskeletal: There is a fracture of the mid the distal manubrium with adjacent hematoma in this area. There are fractures, displaced, of the anterior left third, fourth, and fifth ribs. Fracture of the lateral left clavicle is better seen on radiography. There is stable marked collapse of the T7 vertebral body with moderate wedging of the T8 vertebral body, stable. No retropulsion of bone seen. IMPRESSION: Comminuted manubrial fracture with adjacent soft tissue hematoma, not causing appreciable mass effect on the anterior mediastinum. Fractures of the anterior left third, fourth, and fifth ribs with mild displacement in these areas. No pneumothorax or parenchymal lung contusion. Fracture lateral left clavicle better seen on radiography. Bibasilar atelectasis. Apical lung scarring bilaterally. Mild left lower lobe bronchiectatic change. No demonstrable mediastinal hematoma. No vascular disruption evident. Atherosclerotic calcification is noted in the aorta. There are foci of coronary artery calcification. Stable marked collapse of the T7 vertebral body. Stable moderate wedging of the T8 vertebral body. Electronically Signed   By: Lowella Grip III M.D.   On: 09/25/2015 15:23   Ct Cervical Spine Wo Contrast  09/25/2015   CLINICAL DATA:  Patient status post fall. Left-sided weakness. Left eye bruising and swelling. EXAM: CT HEAD WITHOUT CONTRAST CT MAXILLOFACIAL WITHOUT CONTRAST CT CERVICAL SPINE WITHOUT CONTRAST TECHNIQUE: Multidetector CT imaging of the head, cervical spine, and maxillofacial structures were performed using the standard protocol without intravenous contrast. Multiplanar CT image reconstructions of the cervical spine and maxillofacial structures were also generated. COMPARISON:  CT brain 09/16/2015 FINDINGS: CT HEAD FINDINGS Ventricles and sulci are appropriate for patient's age. Small amount of extra-axial blood products overlying the left cerebral hemisphere (image 18; series 201) measuring 4 mm in thickness, favored to be subdural in etiology. No evidence for acute cortically based infarct or mass lesion or mass-effect. Large area of encephalomalacia throughout the right MCA distribution, grossly unchanged from prior. Orbits are unremarkable. Left for a orbital soft tissue swelling. Soft tissue hematoma overlying the left calvarium. Right pterional craniotomy. Mastoid air cells are unremarkable. No displaced skull fracture. CT MAXILLOFACIAL FINDINGS Left periorbital soft tissue swelling and hematoma formation. Paranasal sinuses are unremarkable. No evidence for acute maxillofacial fracture. The nasal bone, zygomatic arches and pterygoid plates are intact. Maxilla and mandible are intact. Orbital walls are intact. CT CERVICAL SPINE FINDINGS Preservation of the vertebral body and intervertebral disc space heights. Normal anatomic alignment. No evidence for acute cervical spine fracture. Mild facet degenerative changes left C4-5. Prevertebral soft tissues are unremarkable. Visualized thyroid is unremarkable. Biapical pleural parenchymal thickening and scarring. There is a probable acute fracture of the superior aspect of the sternum, incompletely included on current exam (image 91; series 41). Nondisplaced left  anterior first rib fracture. Nondisplaced distal left clavicle fracture. IMPRESSION: Small amount of extra-axial blood products overlying the left cerebral hemisphere measuring up to 4 mm in thickness, favored to be subdural in etiology. Sternal fracture, incompletely included on current evaluation. Nondisplaced left anterior first rib fracture. No acute maxillofacial fracture. No acute cervical spine fracture. Re- demonstrated large area of encephalomalacia within the right MCA territory. Left periorbital soft tissue swelling/hematoma formation. These results were called by telephone at the time of interpretation  on 09/25/2015 at 1:24 pm to Dr. Julianne Rice , who verbally acknowledged these results. Electronically Signed   By: Lovey Newcomer M.D.   On: 09/25/2015 13:47   Dg Shoulder Left  09/25/2015  CLINICAL DATA:  Pain following fall EXAM: LEFT SHOULDER - 2+ VIEW COMPARISON:  None. FINDINGS: Frontal and Y scapular images were obtained. There is a fracture of the distal left clavicle with inferior displacement and angulation distally. There is approximately 1.5 cm of overlapping fracture fragments in the area of the lateral left clavicle. No other fracture. No dislocation. There is mild glenohumeral joint space narrowing. No erosive change. Visualized left lung clear. IMPRESSION: Displaced fracture distal left clavicle with approximately 1.5 cm of overriding of fracture fragments. No other fracture. No dislocation. Mild narrowing glenohumeral joint. Electronically Signed   By: Lowella Grip III M.D.   On: 09/25/2015 12:40   Ct Maxillofacial Wo Cm  09/25/2015  CLINICAL DATA:  Patient status post fall. Left-sided weakness. Left eye bruising and swelling. EXAM: CT HEAD WITHOUT CONTRAST CT MAXILLOFACIAL WITHOUT CONTRAST CT CERVICAL SPINE WITHOUT CONTRAST TECHNIQUE: Multidetector CT imaging of the head, cervical spine, and maxillofacial structures were performed using the standard protocol without intravenous  contrast. Multiplanar CT image reconstructions of the cervical spine and maxillofacial structures were also generated. COMPARISON:  CT brain 09/16/2015 FINDINGS: CT HEAD FINDINGS Ventricles and sulci are appropriate for patient's age. Small amount of extra-axial blood products overlying the left cerebral hemisphere (image 18; series 201) measuring 4 mm in thickness, favored to be subdural in etiology. No evidence for acute cortically based infarct or mass lesion or mass-effect. Large area of encephalomalacia throughout the right MCA distribution, grossly unchanged from prior. Orbits are unremarkable. Left for a orbital soft tissue swelling. Soft tissue hematoma overlying the left calvarium. Right pterional craniotomy. Mastoid air cells are unremarkable. No displaced skull fracture. CT MAXILLOFACIAL FINDINGS Left periorbital soft tissue swelling and hematoma formation. Paranasal sinuses are unremarkable. No evidence for acute maxillofacial fracture. The nasal bone, zygomatic arches and pterygoid plates are intact. Maxilla and mandible are intact. Orbital walls are intact. CT CERVICAL SPINE FINDINGS Preservation of the vertebral body and intervertebral disc space heights. Normal anatomic alignment. No evidence for acute cervical spine fracture. Mild facet degenerative changes left C4-5. Prevertebral soft tissues are unremarkable. Visualized thyroid is unremarkable. Biapical pleural parenchymal thickening and scarring. There is a probable acute fracture of the superior aspect of the sternum, incompletely included on current exam (image 91; series 41). Nondisplaced left anterior first rib fracture. Nondisplaced distal left clavicle fracture. IMPRESSION: Small amount of extra-axial blood products overlying the left cerebral hemisphere measuring up to 4 mm in thickness, favored to be subdural in etiology. Sternal fracture, incompletely included on current evaluation. Nondisplaced left anterior first rib fracture. No acute  maxillofacial fracture. No acute cervical spine fracture. Re- demonstrated large area of encephalomalacia within the right MCA territory. Left periorbital soft tissue swelling/hematoma formation. These results were called by telephone at the time of interpretation on 09/25/2015 at 1:24 pm to Dr. Julianne Rice , who verbally acknowledged these results. Electronically Signed   By: Lovey Newcomer M.D.   On: 09/25/2015 13:47   I have personally reviewed and evaluated these images and lab results as part of my medical decision-making.   EKG Interpretation   Date/Time:  Sunday Sep 25 2015 11:36:47 EDT Ventricular Rate:  75 PR Interval:  184 QRS Duration: 103 QT Interval:  405 QTC Calculation: 452 R Axis:   26 Text  Interpretation:  Sinus rhythm RSR' in V1 or V2, right VCD or RVH  Confirmed by Lita Mains  MD, Barth Trella ([id number]) on 09/25/2015 11:43:21 AM Also  confirmed by Lita Mains  MD, Dmarion Perfect ([id number]), editor [place] CT, Leda Gauze  [phone])  on 09/25/2015 11:44:12 AM     CRITICAL CARE Performed by: Julianne Rice Total critical care time: 40 minutes Critical care time was exclusive of separately billable procedures and treating other patients. Critical care was necessary to treat or prevent imminent or life-threatening deterioration. Critical care was time spent personally by me on the following activities: development of treatment plan with patient and/or surrogate as well as nursing, discussions with consultants, evaluation of patient's response to treatment, examination of patient, obtaining history from patient or surrogate, ordering and performing treatments and interventions, ordering and review of laboratory studies, ordering and review of radiographic studies, pulse oximetry and re-evaluation of patient's condition.  MDM   Final diagnoses:  Subdural hematoma (HCC)   Discussed with Dr. Donne Hazel. Will see the patient and admit. Spoke with Dr Ralene Ok regarding subdural hematoma. We'll consult. No  recommendations at this time. Also discussed with Dr. Mardelle Matte. Will consult on patient's clavicle fracture. Patient placed in sling in the emergency department.     Julianne Rice, MD 09/25/15 [phone]

## 2015-09-25 NOTE — Consult Note (Signed)
Left clavicle and left rib fracture  Full consult to follow.  Nonoperative treatment anticipated.  Altamese Notus, MD [facility] [phone] (p)

## 2015-09-25 NOTE — Progress Notes (Signed)
Pt transferred to 3s05 with 2 Rns on monitor. Pt awake, oriented, VSS. Slight left facial droop present, MD aware per ED RN, all other neuro assessment at baseline. Will continue to monitor.

## 2015-09-26 ENCOUNTER — Ambulatory Visit (HOSPITAL_COMMUNITY): Payer: Medicare Other | Admitting: Physical Therapy

## 2015-09-26 ENCOUNTER — Encounter (HOSPITAL_COMMUNITY): Payer: Medicare Other | Admitting: Specialist

## 2015-09-26 ENCOUNTER — Inpatient Hospital Stay (HOSPITAL_COMMUNITY): Payer: Medicare Other

## 2015-09-26 DIAGNOSIS — S069XAA Unspecified intracranial injury with loss of consciousness status unknown, initial encounter: Secondary | ICD-10-CM | POA: Diagnosis present

## 2015-09-26 DIAGNOSIS — S42002A Fracture of unspecified part of left clavicle, initial encounter for closed fracture: Secondary | ICD-10-CM | POA: Diagnosis present

## 2015-09-26 DIAGNOSIS — S2242XA Multiple fractures of ribs, left side, initial encounter for closed fracture: Secondary | ICD-10-CM | POA: Diagnosis present

## 2015-09-26 DIAGNOSIS — S0181XA Laceration without foreign body of other part of head, initial encounter: Secondary | ICD-10-CM | POA: Diagnosis present

## 2015-09-26 DIAGNOSIS — S069X9A Unspecified intracranial injury with loss of consciousness of unspecified duration, initial encounter: Secondary | ICD-10-CM | POA: Diagnosis present

## 2015-09-26 LAB — CBC
HEMATOCRIT: 28.4 % — AB (ref 36.0–46.0)
Hemoglobin: 9 g/dL — ABNORMAL LOW (ref 12.0–15.0)
MCH: 29.7 pg (ref 26.0–34.0)
MCHC: 31.7 g/dL (ref 30.0–36.0)
MCV: 93.7 fL (ref 78.0–100.0)
Platelets: 153 10*3/uL (ref 150–400)
RBC: 3.03 MIL/uL — ABNORMAL LOW (ref 3.87–5.11)
RDW: 13.7 % (ref 11.5–15.5)
WBC: 4.9 10*3/uL (ref 4.0–10.5)

## 2015-09-26 LAB — BASIC METABOLIC PANEL
ANION GAP: 10 (ref 5–15)
BUN: 22 mg/dL — ABNORMAL HIGH (ref 6–20)
CALCIUM: 8.5 mg/dL — AB (ref 8.9–10.3)
CHLORIDE: 104 mmol/L (ref 101–111)
CO2: 24 mmol/L (ref 22–32)
CREATININE: 0.92 mg/dL (ref 0.44–1.00)
GFR calc non Af Amer: 58 mL/min — ABNORMAL LOW (ref >60)
Glucose, Bld: 97 mg/dL (ref 65–99)
Potassium: 3.9 mmol/L (ref 3.5–5.1)
SODIUM: 138 mmol/L (ref 135–145)

## 2015-09-26 MED ORDER — MORPHINE SULFATE (PF) 2 MG/ML IV SOLN
2.0000 mg | INTRAVENOUS | Status: DC | PRN
Start: 2015-09-26 — End: 2015-09-29
  Filled 2015-09-26: qty 1

## 2015-09-26 MED ORDER — TRAMADOL HCL 50 MG PO TABS
50.0000 mg | ORAL_TABLET | Freq: Four times a day (QID) | ORAL | Status: DC | PRN
  Administered 2015-09-26 – 2015-09-27 (×2): 100 mg via ORAL
  Administered 2015-09-29: 50 mg via ORAL
  Filled 2015-09-26 (×3): qty 2
  Filled 2015-09-26: qty 1

## 2015-09-26 MED ORDER — CHOLECALCIFEROL 10 MCG (400 UNIT) PO TABS
400.0000 [IU] | ORAL_TABLET | Freq: Every day | ORAL | Status: DC
  Administered 2015-09-28: 400 [IU] via ORAL
  Filled 2015-09-26 (×5): qty 1

## 2015-09-26 MED ORDER — DOCUSATE SODIUM 100 MG PO CAPS
100.0000 mg | ORAL_CAPSULE | Freq: Two times a day (BID) | ORAL | Status: DC
  Administered 2015-09-27 – 2015-09-29 (×5): 100 mg via ORAL
  Filled 2015-09-26 (×6): qty 1

## 2015-09-26 MED ORDER — CALCIUM CARBONATE-VITAMIN D 500-200 MG-UNIT PO TABS
1.0000 | ORAL_TABLET | Freq: Three times a day (TID) | ORAL | Status: DC
  Administered 2015-09-26: 1 via ORAL
  Filled 2015-09-26 (×8): qty 1

## 2015-09-26 MED ORDER — POTASSIUM CHLORIDE ER 10 MEQ PO TBCR
10.0000 meq | EXTENDED_RELEASE_TABLET | Freq: Two times a day (BID) | ORAL | Status: DC
  Administered 2015-09-26 – 2015-09-29 (×6): 10 meq via ORAL
  Filled 2015-09-26 (×14): qty 1

## 2015-09-26 MED ORDER — ATORVASTATIN CALCIUM 10 MG PO TABS
10.0000 mg | ORAL_TABLET | Freq: Every day | ORAL | Status: DC
  Administered 2015-09-27 – 2015-09-28 (×2): 10 mg via ORAL
  Filled 2015-09-26 (×3): qty 1

## 2015-09-26 MED ORDER — ALPRAZOLAM 0.25 MG PO TABS: 0.2500 mg | ORAL_TABLET | Freq: Every evening | ORAL | Status: DC | PRN

## 2015-09-26 MED ORDER — FAMOTIDINE 20 MG PO TABS
40.0000 mg | ORAL_TABLET | Freq: Every day | ORAL | Status: DC
  Administered 2015-09-26 – 2015-09-29 (×4): 40 mg via ORAL
  Filled 2015-09-26 (×4): qty 2

## 2015-09-26 MED ORDER — HALOPERIDOL LACTATE 5 MG/ML IJ SOLN
2.5000 mg | INTRAMUSCULAR | Status: DC | PRN
  Filled 2015-09-26: qty 1

## 2015-09-26 NOTE — Progress Notes (Signed)
No issues overnight. Minimal HA.  EXAM:  BP 116/54 mmHg  Pulse 88  Temp(Src) 98.3 F (36.8 C) (Oral)  Resp 21  Ht 5\' 5"  (1.651 m)  Wt 46.2 kg (101 lb 13.6 oz)  BMI 16.95 kg/m2  SpO2 95%  Awake, alert, oriented  Speech fluent, appropriate  CN grossly intact  5/5 BUE/BLE   IMAGING: CTH reviewed, stable small left SDH. No mass effect, no HCP.  IMPRESSION:  81 y.o. female s/p fall with stable small left SDH. Does not require any active intervention  PLAN: - Would finish prophylactic Keppra course - F/U with me PRN

## 2015-09-26 NOTE — Progress Notes (Signed)
Pt states she is having 8/10 pain. This RN attempted to give 100 mg Ultram. Pt then became increasingly agitated, paranoid, refusing pain meds, states that staff are "torturing" her, threatening to sue staff. Caregiver Lamona Curl at bedside with this RN, Lamona Curl called pt's daughter Judson Roch, who stated that staff may give her any medicine needed to sedate her to help with treatment. Pt becoming combative, 2 mg morphine given IV per PRN orders. Will continue to monitor closely.

## 2015-09-26 NOTE — Progress Notes (Signed)
   09/26/15 2100  Clinical Encounter Type  Visited With Health care provider  Visit Type Initial  Referral From Chaplain  Spiritual Encounters  Spiritual Needs Emotional  Stress Factors  Family Stress Factors None identified   Chaplain encountered pt caregiver in the lobby of [facility].  She was upset following a difficult encounter with the pt.  She expresed great love and concern for the pt but said that pt exhibited paranoid behaviors about the medications and treatments.  In the process, pt hurt caregivers' feelings.  Chaplain offered empathetic listening and emotional support.  Pt is available if further support is desired.   [employer], Chaplain

## 2015-09-26 NOTE — Progress Notes (Signed)
Patient ID: Samantha Hudson, female   DOB: 04/29/1935, 81 y.o.   MRN: [id number]   LOS: 1 day   Subjective: No unexpected c/o.   Objective: Vital signs in last 24 hours: Temp:  [97.8 F (36.6 C)-98.9 F (37.2 C)] 98.3 F (36.8 C) (05/08 0346) Pulse Rate:  [77-88] 88 (05/08 0650) Resp:  [13-24] 21 (05/08 0650) BP: (110-156)/(54-84) 116/54 mmHg (05/08 0346) SpO2:  [94 %-100 %] 95 % (05/08 0650) Weight:  [46.2 kg (101 lb 13.6 oz)-46.72 kg (103 lb)] 46.2 kg (101 lb 13.6 oz) (05/07 1816)    IS: 72ml   Laboratory  CBC  Recent Labs  09/25/15 1156 09/26/15 0334  WBC 8.3 4.9  HGB 10.9* 9.0*  HCT 35.1* 28.4*  PLT 192 153   BMET  Recent Labs  09/25/15 1156 09/26/15 0334  NA 141 138  K 4.4 3.9  CL 101 104  CO2 26 24  GLUCOSE 102* 97  BUN 20 22*  CREATININE 0.97 0.92  CALCIUM 9.5 8.5*    Radiology Results CT HEAD WITHOUT CONTRAST  TECHNIQUE: Contiguous axial images were obtained from the base of the skull through the vertex without intravenous contrast.  COMPARISON: 09/25/2015  FINDINGS: No interval change in the extra-axial blood collection overlying the left cerebral hemisphere, 3-4 mm in thickness. No new intracranial hemorrhage.  There is encephalomalacia due to remote right MCA infarction.  No interval change from yesterday's study.  Calvarium and skullbase are intact except for prior right temporal parietal craniotomy.  IMPRESSION: Unchanged extra-axial blood overlying the left cerebral hemisphere to a depth of 3-4 mm. This probably is a subdural collection. No new intracranial hemorrhage. No significant mass effect. No midline shift.   Electronically Signed  By: Andreas Newport M.D.  On: 09/26/2015 06:59   Physical Exam General appearance: alert and no distress Resp: clear to auscultation bilaterally Cardio: regular rate and rhythm GI: normal findings: bowel sounds normal and soft, non-tender Extremities:  NVI   Assessment/Plan: Fall TBI w/SDH -- HCT stable, ST consult Left clavicle fx -- Sling for comfort per Dr. Marcelino Scot Multiple left rib fxs -- Pulmonary toilet ABL anemia -- Superimposed on mild chronic anemia, monitor Multiple medical problems -- Home meds save Aggrenox FEN -- Speech consult VTE -- SCD's, Lovenox Dispo -- Continue SDU today, TBI team    Lisette Abu, PA-C Pager: [phone] General Trauma PA Pager: [phone]  09/26/2015

## 2015-09-26 NOTE — Progress Notes (Addendum)
Patient is refusing all scheduled PO medications at this time. Per pt's caregiver, Lamona Curl, pt has stated that she wants to wait until her husband and daughter Judson Roch are at bedside. Family also concerned about pt's episode of agitation, would like additional sedative ordered if possible. MD made aware, orders received for 2.5 mg Haldol IV q4 PRN. Pt resting and appears comfortable at this time, will continue to monitor closely.

## 2015-09-27 ENCOUNTER — Encounter (HOSPITAL_COMMUNITY): Payer: Self-pay | Admitting: Physical Therapy

## 2015-09-27 LAB — CBC
HCT: 30 % — ABNORMAL LOW (ref 36.0–46.0)
HEMOGLOBIN: 9.1 g/dL — AB (ref 12.0–15.0)
MCH: 28.6 pg (ref 26.0–34.0)
MCHC: 30.3 g/dL (ref 30.0–36.0)
MCV: 94.3 fL (ref 78.0–100.0)
Platelets: 156 10*3/uL (ref 150–400)
RBC: 3.18 MIL/uL — ABNORMAL LOW (ref 3.87–5.11)
RDW: 13.5 % (ref 11.5–15.5)
WBC: 6.1 10*3/uL (ref 4.0–10.5)

## 2015-09-27 MED ORDER — PROCHLORPERAZINE EDISYLATE 5 MG/ML IJ SOLN
10.0000 mg | INTRAMUSCULAR | Status: DC | PRN
  Administered 2015-09-27: 10 mg via INTRAVENOUS
  Filled 2015-09-27 (×2): qty 2

## 2015-09-27 MED ORDER — SODIUM CHLORIDE 0.9 % IV SOLN
INTRAVENOUS | Status: DC
  Administered 2015-09-27 – 2015-09-28 (×2): via INTRAVENOUS
  Filled 2015-09-27: qty 1000

## 2015-09-27 NOTE — Evaluation (Signed)
Occupational Therapy Evaluation Patient Details Name: Samantha ForehandBarbara P Sudol MRN: [id number] DOB: 06/03/1934 Today's Date: 09/27/2015    History of Present Illness 81 yo s/p fall with L clavicle fx, TBI with SDH, multiple rib fx. PMH: parkinsons, brain surg for meningioma glaucoma, fibromyalgia, IBS, osteoporsis, CAD L hip hemi arthoplasty   Clinical Impression   PT admitted with s/p fall. Pt currently with functional limitiations due to the deficits listed below (see OT problem list). [facility] with 24/7 (A) available. Pt will benefit from skilled OT to increase their independence and safety with adls and balance to allow discharge SNF.     Follow Up Recommendations  SNF;Supervision/Assistance - 24 hour    Equipment Recommendations  3 in 1 bedside comode;Hospital bed;Other (comment) (RW)    Recommendations for Other Services       Precautions / Restrictions Precautions Precautions: Fall Restrictions Weight Bearing Restrictions: Yes LUE Weight Bearing: Weight bearing as tolerated      Mobility Bed Mobility               General bed mobility comments: in chair   Transfers Overall transfer level: Needs assistance Equipment used: Rolling walker (2 wheeled) Transfers: Sit to/from Stand Sit to Stand: +2 physical assistance;Mod assist         General transfer comment: required pad used to boost pt from sit to stand    Balance Overall balance assessment: Needs assistance Sitting-balance support: Bilateral upper extremity supported;Feet supported Sitting balance-Leahy Scale: Fair     Standing balance support: Bilateral upper extremity supported;During functional activity Standing balance-Leahy Scale: Poor                              ADL Overall ADL's : Needs assistance/impaired Eating/Feeding: Moderate assistance;Sitting Eating/Feeding Details (indicate cue type and reason): pt declines po intake and medications today. pt fixated on  staff trying to hurt her.  Grooming: Wash/dry hands;Minimal assistance   Upper Body Bathing: Maximal assistance               Toilet Transfer: +2 for physical assistance;Moderate assistance Toilet Transfer Details (indicate cue type and reason): pad used to help boost from chair level and decr pressure on L ribs         Functional mobility during ADLs: +2 for physical assistance;Moderate assistance General ADL Comments: Pt with narrowed base of support , pt with L LE weight bearing only at the toe. Pt needed (A) to initiate task.      Vision Vision Assessment?:  (unable to assess)   Perception     Praxis      Pertinent Vitals/Pain Pain Assessment: Faces Faces Pain Scale: Hurts little more Pain Location: pt yelling in anticipation without therapist contact Pain Intervention(s): Monitored during session;Repositioned;Limited activity within patient's tolerance     Hand Dominance Right   Extremity/Trunk Assessment Upper Extremity Assessment Upper Extremity Assessment: Generalized weakness   Lower Extremity Assessment Lower Extremity Assessment: Defer to PT evaluation   Cervical / Trunk Assessment Cervical / Trunk Assessment: Kyphotic   Communication Communication Communication: No difficulties   Cognition Arousal/Alertness: Awake/alert Behavior During Therapy: Flat affect Overall Cognitive Status: Impaired/Different from baseline Area of Impairment: Attention;Memory;Awareness;Problem solving   Current Attention Level: Focused Memory: Decreased short-term memory     Awareness: Intellectual Problem Solving: Slow processing General Comments: pt needed cues for visual attention to task during session. pt calling out "she is hurting me"  prior to any contact. Family present and states" you are fine they arent touching you " and pt stopped yelling   General Comments       Exercises       Shoulder Instructions      Home Living Family/patient expects to be  discharged to:: Private residence Living Arrangements: Spouse/significant other;Children   Type of Home: House Home Access: Stairs to enter [employer] of Steps: 3 Entrance Stairs-Rails: Right Home Layout: One level     Bathroom Shower/Tub: Teacher, early years/pre: [place]: Environmental consultant - 2 wheels;Bedside commode;Grab bars - toilet;Grab bars - tub/shower;Wheelchair - manual   Additional Comments: transport chair, toilet riser      Prior Functioning/Environment Level of Independence: Independent with assistive device(s)        Comments: has a caregiver family hired for patient and spouse 5 months ago that is self pay and is not a certified care giver    OT Diagnosis: Generalized weakness;Cognitive deficits;Acute pain   OT Problem List: Decreased strength;Decreased activity tolerance;Impaired balance (sitting and/or standing);Decreased cognition;Decreased safety awareness;Decreased knowledge of use of DME or AE;Cardiopulmonary status limiting activity;Decreased knowledge of precautions   OT Treatment/Interventions: Self-care/ADL training;Therapeutic exercise;DME and/or AE instruction;Therapeutic activities;Cognitive remediation/compensation;Patient/family education;Balance training;Visual/perceptual remediation/compensation    OT Goals(Current goals can be found in the care plan section) Acute Rehab OT Goals Patient Stated Goal: family like placement near spouse OT Goal Formulation: With patient/family Time For Goal Achievement: 10/11/15 Potential to Achieve Goals: Good  OT Frequency: Min 2X/week   Barriers to D/C: Decreased caregiver support          Co-evaluation PT/OT/SLP Co-Evaluation/Treatment: Yes Reason for Co-Treatment: Complexity of the patient's impairments (multi-system involvement);For patient/therapist safety   OT goals addressed during session: ADL's and self-care;Strengthening/ROM      End of Session Equipment  Utilized During Treatment: Gait belt;Rolling walker Nurse Communication: Mobility status;Precautions  Activity Tolerance: Patient tolerated treatment well Patient left: in chair;with call bell/phone within reach;with family/visitor present;with nursing/sitter in room   Time: 1340-1412 OT Time Calculation (min): 32 min Charges:  OT General Charges $OT Visit: 1 Procedure OT Evaluation $OT Eval Moderate Complexity: 1 Procedure G-Codes:    Parke Poisson B 10/21/15, 3:37 PM   Jeri Modena   OTR/L [phone] Office: [phone] .

## 2015-09-27 NOTE — Therapy (Signed)
[employer] [address] [place], [place], [address] Phone: [phone]   Fax:  [phone]  Patient Details  Name: Samantha Hudson MRN: [id number] Date of Birth: 1934-12-25 Referring Provider:  No ref. provider found  Encounter Date: 09/27/2015   PHYSICAL THERAPY DISCHARGE SUMMARY  Visits from Start of Care: 13  Current functional level related to goals / functional outcomes: Patient had recent fall, was admitted to hospital with multiple fractures and from review of acute PT note, appears to likely be going to SNF after release from hospital. Over her recent course of outpatient rehabilitation, patient has experienced multiple health problems, which have delayed her progress with skilled PT services. At this time find it very likely that stress from health issues and recent personal events is likely partially responsible for exacerbating Parkinsonian symptoms. Will DC from skilled OP PT services at this time, and may resume later on with new MD referral when patient status is improved.    Remaining deficits: Classic parkinsonian presentation- muscle weakness, unsteadiness, reduced functional activity tolerance, fall risk, postural impairment, impaired coordination   Education / Equipment: Husband educated by front desk regarding outpatient PT plan to DC  Plan: Patient agrees to discharge.  Patient goals were not met. Patient is being discharged due to a change in medical status.  ?????        Deniece Ree PT, DPT [facility] [address] [place], [place], [address] Phone: [phone]   Fax:  [phone]

## 2015-09-27 NOTE — Care Management Note (Signed)
Case Management Note  Patient Details  Name: Samantha Hudson MRN: [id number] Date of Birth: May 27, 1934  Subjective/Objective:  Pt admitted on 09/25/15 s/p Lt clavicle fracture, TBI with SDH, and multiple rib fractures.  PTA, pt resided at home with spouse and 24hr caregiver, and was fairly independent.                  Action/Plan: Met with pt, spouse and daughter from [place].  Awaiting PT/OT and ST consults to determine LOC needed at dc.  Will continue to follow for recommendations.  If SNF needed, family states they would prefer [place] area.    Expected Discharge Date:                  Expected Discharge Plan:  Skilled Nursing Facility  In-House Referral:  Clinical Social Work  Discharge planning Services  CM Consult  Post Acute Care Choice:    Choice offered to:     DME Arranged:    DME Agency:     HH Arranged:    [place] Agency:     Status of Service:  In process, will continue to follow  Medicare Important Message Given:    Date Medicare IM Given:    Medicare IM give by:    Date Additional Medicare IM Given:    Additional Medicare Important Message give by:     If discussed at [place] of Stay Meetings, dates discussed:    Additional Comments:  Reinaldo Raddle, RN, BSN  Trauma/Neuro ICU Case Manager [phone]

## 2015-09-27 NOTE — Progress Notes (Signed)
Data: patient alert and oriented x3 with intermittent periods of paranoia and agitation. Patient had periods of the day (please refer to [facility]) where she refused medications related to her paranoia. 0945 pt has c/o of nausea and vomiting that persisted after zofran and subsided after compazine. Patient has decrease PO intake and urine output . See echarting for additional data.   Actions: oriented to environment and reduced stimuli that may increase agitation, notified HO of input/ output. Updated pt and family on POC. Initiated IV fluids.   Response: patient was able to take some of her priority meds ( per family). Patient remained safe.

## 2015-09-27 NOTE — Evaluation (Signed)
Speech Language Pathology Evaluation Patient Details Name: Samantha Hudson MRN: [id number] DOB: June 18, 1934 Today's Date: 09/27/2015 Time: [id number] SLP Time Calculation (min) (ACUTE ONLY): 31 min  Problem List:  Patient Active Problem List   Diagnosis Date Noted  . Fracture of left clavicle 09/26/2015  . Fracture of multiple ribs of left side 09/26/2015  . Facial laceration 09/26/2015  . TBI (traumatic brain injury) ([place]) 09/26/2015  . Fall 09/25/2015  . Confusion   . Acute encephalopathy 09/17/2015  . Parkinson's disease ([place]) 09/17/2015  . Acute kidney injury ([place]) 09/17/2015  . Altered mental status 09/16/2015  . Anxiety 09/16/2015  . Paranoia ([place]) 09/16/2015  . CAD (coronary artery disease) 08/14/2015  . Chest pain 08/14/2015  . Constipation 08/08/2015  . Seizures ([place]) 06/14/2015  . Degenerative arthritis of thumb 03/03/2015  . Abnormality of gait 08/12/2014  . Parkinsonism ([place]) 08/12/2014  . Stroke ([place]) 08/12/2014  . Muscle weakness (generalized) 02/22/2014  . Stiffness of left knee 02/22/2014  . Hamstring tightness of left lower extremity 02/22/2014  . Pain in joint, pelvic region and thigh 02/22/2014  . Difficulty walking 04/07/2013  . Osteoporosis 03/10/2013  . Anemia 03/10/2013  . Mild malnutrition ([place]) 03/10/2013  . S/P left hip hemiarthroplasty 12/30/2012  . Osteoporosis with pathological fracture with delayed healing 11/25/2012  . Pedal edema 10/27/2012  . Fracture of sacrum ([place]) 10/02/2012  . Stricture and stenosis of esophagus 09/24/2011  . Hyperlipidemia 11/03/2008  . Coronary atherosclerosis 11/03/2008   Past Medical History:  Past Medical History  Diagnosis Date  . Hyperlipemia   . IBS (irritable bowel syndrome)   . Osteoporosis   . Fibromyalgia   . Glaucoma     HAD LASER SURGERY - NO EYE DROPS REQUIRED  . Globus hystericus   . Fibromyalgia   . Meningioma ([place])   . CAD (coronary artery disease)     HEART STENT PLACED ABOUT 2000- NO  LONGER SEES CARDIOLOGIST; NO C/O OF CHEST PAINS OR SOB  . Heart murmur     MVP SINCE THE 60'S - TAKES ATENOLOL -  . Anxiety   . Swelling     BILATERAL FEET AND LEGS - ONSET OF SWELLING APRIL 2014  . GERD (gastroesophageal reflux disease)   . Arthritis   . Fracture   . CVA (cerebral vascular accident) ([place])     STROKE [address] TO REMOVE BENIGN MENINGIOMA - HAS LEFT SIDED WEAKNESS AND A LITTLE NUMBNESS  . Pain     "EXCRUCIATING PAIN" LEFT HIP - HAS A FRACTURE - IS CONFINED TO W/C AT HOME - NO WEIGHT BEARING LEFT HIP.  Marland Kitchen Parkinson's disease [facility])    Past Surgical History:  Past Surgical History  Procedure Laterality Date  . Brain surgery      tumor removed  . Tonsillectomy    . Partial hysterectomy    . Glaucoma repair    . Cataract extraction    . Kidney surgery      left - HX ENLARGED LEFT KIDNEY ON XRAY - SURGERY WAS DONE ON URETER   . Colonoscopy    . Coronary angioplasty    . Hip arthroplasty Left 12/29/2012    Procedure: LEFT HIP HEMI ARTHROPLASTY ;  Surgeon: Mauri Pole, MD;  Location: [facility] ORS;  Service: Orthopedics;  Laterality: Left;   HPI:  81 yo s/p fall with L clavicle fx, TBI with SDH, multiple rib fx. PMH: parkinsons, brain surg for meningioma glaucoma, fibromyalgia, IBS, osteoporsis, CAD  L hip hemi arthoplasty   Assessment / Plan / Recommendation Clinical Impression  Pt's family describes a significant decline in function over the past three weeks, further exacerbated by recent fall. She is oriented to person and knows she is in [facility], but does not know what city she is in or why she is in the hospital. Most verbal communication consists of paranoid thoughts. She has difficulty recalling daily events despite cueing from therapist. She is very internally distracted and has difficulty completing one-step commands. Pt will need additional SLP f/u to maximize cognitive function. Will continue to f/u for swallow evaluation as well, as pt  refused any PO intake at this time due to concerns that the therapist had poisoned it with blood.    SLP Assessment  Patient needs continued Speech Lanaguage Pathology Services    Follow Up Recommendations  Skilled Nursing facility;24 hour supervision/assistance    Frequency and Duration min 2x/week  2 weeks      SLP Evaluation Prior Functioning  Cognitive/Linguistic Baseline: Baseline deficits Baseline deficit details: family reports mild baseline difficulty with processing, drastic decline in the last three weeks in overall function Type of Home: House  Lives With: Spouse;Other (Comment) (has a hired aid)   Clinical biochemist Status: Impaired/Different from baseline Arousal/Alertness: Awake/alert Orientation Level: Oriented to person;Oriented to place;Disoriented to situation Attention: Sustained Sustained Attention: Impaired Sustained Attention Impairment: Verbal basic;Functional basic Memory: Impaired Memory Impairment: Decreased recall of new information Awareness: Impaired Awareness Impairment: Intellectual impairment;Emergent impairment;Anticipatory impairment Problem Solving: Impaired Problem Solving Impairment: Verbal basic Behaviors: Verbal agitation;Confabulation;Other (comment) (paranoid) Safety/Judgment: Impaired    Comprehension  Auditory Comprehension Overall Auditory Comprehension: Impaired Commands: Impaired One Step Basic Commands: 25-49% accurate    Expression Expression Primary Mode of Expression: Verbal Verbal Expression Overall Verbal Expression: Appears within functional limits for tasks assessed Written Expression Dominant Hand: Right   Oral / Motor  Oral Motor/Sensory Function Overall Oral Motor/Sensory Function: Mild impairment Facial ROM: Reduced left;Suspected CN VII (facial) dysfunction Facial Symmetry: Abnormal symmetry left;Suspected CN VII (facial) dysfunction Facial Strength: Reduced left;Suspected CN VII (facial)  dysfunction Motor Speech Overall Motor Speech: Impaired at baseline (husband describes hypokinetic dysarthria)   GO                   Germain Osgood, M.A. CCC-SLP [phone]  Germain Osgood 09/27/2015, 5:35 PM

## 2015-09-27 NOTE — Evaluation (Signed)
Physical Therapy Evaluation Patient Details Name: Samantha Hudson MRN: [id number] DOB: [birth year] Today's Date: 09/27/2015   History of Present Illness  81 yo s/p fall with L clavicle fx, TBI with SDH, multiple rib fx. PMH: parkinsons, brain surg for meningioma glaucoma, fibromyalgia, IBS, osteoporsis, CAD L hip hemi arthoplasty  Clinical Impression  Patient demonstrates deficits in functional mobility as indicated below. Will need continued skilled PT to address deficits and maximize function. Will see as indicated and progress as tolerated.     Follow Up Recommendations SNF;Supervision/Assistance - 24 hour    Equipment Recommendations  None recommended by PT    Recommendations for Other Services       Precautions / Restrictions Precautions Precautions: Fall Restrictions Weight Bearing Restrictions: Yes LUE Weight Bearing: Weight bearing as tolerated      Mobility  Bed Mobility               General bed mobility comments: in chair   Transfers Overall transfer level: Needs assistance Equipment used: Rolling walker (2 wheeled) Transfers: Sit to/from Stand Sit to Stand: +2 physical assistance;Mod assist         General transfer comment: required pad used to boost pt from sit to stand  Ambulation/Gait Ambulation/Gait assistance: Mod assist;+2 physical assistance Ambulation Distance (Feet): 80 Feet Assistive device: Rolling walker (2 wheeled) Gait Pattern/deviations: Step-through pattern;Decreased stride length;Shuffle;Scissoring;Festinating;Trunk flexed;Narrow base of support Gait velocity: decreased Gait velocity interpretation: Below normal speed for age/gender General Gait Details: patient with extremely flexed posture, max cues for faciliation of upright. Tactile cues to maintain mobility with assist for pacing, control of RW, and stability. Gait deviations noted with extremely narrow BOS  Stairs            Wheelchair Mobility    Modified Rankin  (Stroke Patients Only)       Balance Overall balance assessment: Needs assistance Sitting-balance support: Bilateral upper extremity supported;Feet supported Sitting balance-Leahy Scale: Fair     Standing balance support: Bilateral upper extremity supported;During functional activity Standing balance-Leahy Scale: Poor                               Pertinent Vitals/Pain Pain Assessment: Faces Faces Pain Scale: Hurts little more Pain Location: pt yelling in anticipation without therapist contact Pain Intervention(s): Monitored during session;Repositioned;Limited activity within patient's tolerance    Home Living Family/patient expects to be discharged to:: Private residence Living Arrangements: Spouse/significant other;Children   Type of Home: House Home Access: Stairs to enter Entrance Stairs-Rails: Right Entrance Stairs-Number of Steps: 3 Home Layout: One level Home Equipment: Environmental consultant - 2 wheels;Bedside commode;Grab bars - toilet;Grab bars - tub/shower;Wheelchair - manual Additional Comments: transport chair, toilet riser    Prior Function Level of Independence: Independent with assistive device(s)         Comments: has a caregiver family hired for patient and spouse 5 months ago that is self pay and is not a certified care giver     Hand Dominance   Dominant Hand: Right    Extremity/Trunk Assessment   Upper Extremity Assessment: Generalized weakness           Lower Extremity Assessment: Generalized weakness      Cervical / Trunk Assessment: Kyphotic  Communication   Communication: No difficulties  Cognition Arousal/Alertness: Awake/alert Behavior During Therapy: Flat affect Overall Cognitive Status: Impaired/Different from baseline Area of Impairment: Attention;Memory;Awareness;Problem solving   Current Attention Level: Focused Memory: Decreased short-term memory  Awareness: Intellectual Problem Solving: Slow processing General  Comments: pt needed cues for visual attention to task during session. pt calling out "she is hurting me" prior to any contact. Family present and states" you are fine they arent touching you " and pt stopped yelling    General Comments General comments (skin integrity, edema, etc.): bruises on face and L lateral ribs    Exercises        Assessment/Plan    PT Assessment Patient needs continued PT services  PT Diagnosis Difficulty walking;Abnormality of gait;Generalized weakness;Acute pain;Altered mental status   PT Problem List Decreased strength;Decreased activity tolerance;Decreased balance;Decreased mobility;Decreased coordination;Decreased safety awareness;Pain  PT Treatment Interventions DME instruction;Gait training;Functional mobility training;Therapeutic activities;Therapeutic exercise;Balance training;Cognitive remediation;Patient/family education   PT Goals (Current goals can be found in the Care Plan section) Acute Rehab PT Goals Patient Stated Goal: family like placement near spouse PT Goal Formulation: With patient/family Time For Goal Achievement: 10/11/15 Potential to Achieve Goals: Fair    Frequency Min 2X/week   Barriers to discharge Decreased caregiver support      Co-evaluation PT/OT/SLP Co-Evaluation/Treatment: Yes Reason for Co-Treatment: Complexity of the patient's impairments (multi-system involvement);For patient/therapist safety PT goals addressed during session: Mobility/safety with mobility OT goals addressed during session: ADL's and self-care;Strengthening/ROM       End of Session Equipment Utilized During Treatment: Gait belt Activity Tolerance: Patient tolerated treatment well Patient left: in chair;with call bell/phone within reach;with family/visitor present Nurse Communication: Mobility status         Time: [id number] PT Time Calculation (min) (ACUTE ONLY): 28 min   Charges:   PT Evaluation $PT Eval Moderate Complexity: 1 Procedure      PT G CodesDuncan Dull 10-13-2015, 4:08 PM Alben Deeds, [place] DPT  [phone]

## 2015-09-27 NOTE — Progress Notes (Signed)
Daughter states her mother has not slept in two nights. Pt and family educated on neuro checks being done every 2 hours.Pt and family at bedside asks if pt cannot be awaken. Family and pt told that I will continue to look in on them and monitor and that for any reason pt awakens to please let nurse know so that pt can be asessed.

## 2015-09-27 NOTE — Progress Notes (Signed)
Patient ID: Samantha Hudson, female   DOB: Aug 02, 1934, 81 y.o.   MRN: [id number]   LOS: 2 days   Subjective: Noted e/o agitation/paranoia last night, family reports this is not the first time it's happened.   Objective: Vital signs in last 24 hours: Temp:  [97.7 F (36.5 C)-98.8 F (37.1 C)] 98.8 F (37.1 C) (05/09 0826) Pulse Rate:  [71-100] 87 (05/09 0319) Resp:  [15-21] 21 (05/09 0319) BP: (106-151)/(57-78) 137/71 mmHg (05/09 0319) SpO2:  [88 %-98 %] 97 % (05/09 0319)    IS: 518ml (=)   Laboratory  CBC  Recent Labs  09/26/15 0334 09/27/15 0307  WBC 4.9 6.1  HGB 9.0* 9.1*  HCT 28.4* 30.0*  PLT 153 156    Physical Exam General appearance: alert and no distress Resp: clear to auscultation bilaterally Cardio: irregularly irregular rhythm GI: normal findings: bowel sounds normal and soft, non-tender Neuro: Ox3   Assessment/Plan: Fall TBI w/SDH -- HCT stable, ST consult Left clavicle fx -- Sling for comfort per Dr. Marcelino Scot Multiple left rib fxs -- Pulmonary toilet ABL anemia -- Superimposed on mild chronic anemia, stable Multiple medical problems -- Home meds save Aggrenox FEN -- Speech consult VTE -- SCD's, Lovenox Dispo -- Continue SDU today, TBI team    Lisette Abu, PA-C Pager: [phone] General Trauma PA Pager: [phone]  09/27/2015

## 2015-09-28 ENCOUNTER — Ambulatory Visit (HOSPITAL_COMMUNITY): Payer: Medicare Other | Admitting: Physical Therapy

## 2015-09-28 NOTE — Evaluation (Signed)
Clinical/Bedside Swallow Evaluation Patient Details  Name: Samantha Hudson MRN: [id number] Date of Birth: 09-20-1934  Today's Date: 09/28/2015 Time: SLP Start Time (ACUTE ONLY): 1056 SLP Stop Time (ACUTE ONLY): 1124 SLP Time Calculation (min) (ACUTE ONLY): 28 min  Past Medical History:  Past Medical History  Diagnosis Date  . Hyperlipemia   . IBS (irritable bowel syndrome)   . Osteoporosis   . Fibromyalgia   . Glaucoma     HAD LASER SURGERY - NO EYE DROPS REQUIRED  . Globus hystericus   . Fibromyalgia   . Meningioma ([place])   . CAD (coronary artery disease)     HEART STENT PLACED ABOUT 2000- NO LONGER SEES CARDIOLOGIST; NO C/O OF CHEST PAINS OR SOB  . Heart murmur     MVP SINCE THE 60'S - TAKES ATENOLOL -  . Anxiety   . Swelling     BILATERAL FEET AND LEGS - ONSET OF SWELLING APRIL 2014  . GERD (gastroesophageal reflux disease)   . Arthritis   . Fracture   . CVA (cerebral vascular accident) ([place])     STROKE 7 [place]in TO REMOVE BENIGN MENINGIOMA - HAS LEFT SIDED WEAKNESS AND A LITTLE NUMBNESS  . Pain     "EXCRUCIATING PAIN" LEFT HIP - HAS A FRACTURE - IS CONFINED TO W/C AT HOME - NO WEIGHT BEARING LEFT HIP.  Marland Kitchen Parkinson's disease [facility])    Past Surgical History:  Past Surgical History  Procedure Laterality Date  . Brain surgery      tumor removed  . Tonsillectomy    . Partial hysterectomy    . Glaucoma repair    . Cataract extraction    . Kidney surgery      left - HX ENLARGED LEFT KIDNEY ON XRAY - SURGERY WAS DONE ON URETER   . Colonoscopy    . Coronary angioplasty    . Hip arthroplasty Left 12/29/2012    Procedure: LEFT HIP HEMI ARTHROPLASTY ;  Surgeon: Mauri Pole, MD;  Location: [facility] ORS;  Service: Orthopedics;  Laterality: Left;   HPI:  81 yo s/p fall with L clavicle fx, TBI with SDH, multiple rib fx. PMH: parkinsons, brain surg for meningioma glaucoma, fibromyalgia, IBS, osteoporsis, CAD L hip hemi arthoplasty   Assessment / Plan /  Recommendation Clinical Impression  Pt observed during breakfast meal and medication administration with thin liquids and mixed consistency boluses. Oral holding noted only with larger pill and water, with throat clearing noted after swallowing did occur. With Min cues from SLP for stronger cough, no further throat clearing noted. Mild oral dysphagia includes anterior loss of thin liquids, but solids are cleared well. Recommend to continue regular diet and thin liquids. Smaller pills can be given whole with liquids, but larger pills would be better administered in something pureed. Will f/u briefly for tolerance.    Aspiration Risk  Mild aspiration risk    Diet Recommendation Regular;Thin liquid   Liquid Administration via: Cup;Straw Medication Administration: Whole meds with liquid (larger pills whole in puree) Supervision: Patient able to self feed;Full supervision/cueing for compensatory strategies Compensations: Minimize environmental distractions;Slow rate;Small sips/bites Postural Changes: Seated upright at 90 degrees;Remain upright for at least 30 minutes after po intake    Other  Recommendations Oral Care Recommendations: Oral care BID   Follow up Recommendations  Skilled Nursing facility;24 hour supervision/assistance    Frequency and Duration min 2x/week  2 weeks       Prognosis  Swallow Study   General HPI: 81 yo s/p fall with L clavicle fx, TBI with SDH, multiple rib fx. PMH: parkinsons, brain surg for meningioma glaucoma, fibromyalgia, IBS, osteoporsis, CAD L hip hemi arthoplasty Type of Study: Bedside Swallow Evaluation Previous Swallow Assessment: none in chart, daughter describes pocketing of foods after prior CVA for which she saw SLP Diet Prior to this Study: Regular;Thin liquids Temperature Spikes Noted: No Respiratory Status: Nasal cannula History of Recent Intubation: No Behavior/Cognition: Alert;Cooperative;Pleasant mood Oral Cavity Assessment: Within  Functional Limits Oral Care Completed by SLP: No Oral Cavity - Dentition: Adequate natural dentition Vision: Functional for self-feeding Self-Feeding Abilities: Able to feed self Patient Positioning: Upright in chair Baseline Vocal Quality: Low vocal intensity;Other (comment) (dysarthric, suspect secondary to PD) Volitional Cough: Weak    Oral/Motor/Sensory Function     Ice Chips Ice chips: Not tested   Thin Liquid Thin Liquid: Impaired Presentation: Straw;Spoon (water by straw, milk and cereal, pills and water) Oral Phase Impairments: Reduced labial seal Oral Phase Functional Implications: Left anterior spillage;Oral residue Pharyngeal  Phase Impairments: Throat Clearing - Immediate;Throat Clearing - Delayed    Nectar Thick Nectar Thick Liquid: Not tested   Honey Thick Honey Thick Liquid: Not tested   Puree Puree: Not tested   Solid   GO   Solid: Within functional limits Presentation: Self Fed;Spoon       Germain Osgood, M.A. CCC-SLP [phone]  Germain Osgood 09/28/2015,12:05 PM

## 2015-09-28 NOTE — Progress Notes (Signed)
Physical Therapy Treatment Patient Details Name: Samantha Hudson MRN: [id number] DOB: 01-Jan-1935 Today's Date: 09/28/2015    History of Present Illness 81 yo s/p fall with L clavicle fx, TBI with SDH, multiple rib fx. PMH: parkinsons, brain surg for meningioma glaucoma, fibromyalgia, IBS, osteoporsis, CAD L hip hemi arthoplasty    PT Comments    Patient seen for therapy, tolerated bed mobility and OOB to chair this session. Patient pleasant and engaging this session. Will continue to see and progress as tolerated. Continue to recommend ST SNF upon acute discharge.  Follow Up Recommendations  SNF;Supervision/Assistance - 24 hour     Equipment Recommendations  None recommended by PT    Recommendations for Other Services       Precautions / Restrictions Precautions Precautions: Fall Restrictions Weight Bearing Restrictions: Yes LUE Weight Bearing: Weight bearing as tolerated    Mobility  Bed Mobility Overal bed mobility: Needs Assistance Bed Mobility: Supine to Sit     Supine to sit: Max assist     General bed mobility comments: patient able to initite LEs to EOB, max assist to elevate trunk and rotate hips secondary to pain in LUE  Transfers Overall transfer level: Needs assistance Equipment used: 2 person hand held assist Transfers: Sit to/from [facility] Sit to Stand: +2 physical assistance;Mod assist         General transfer comment: Assist to elevate to standing and maintain upright secondary to posterior bias, shuffling pivot steps to chair with encouragement  Ambulation/Gait                 Stairs            Wheelchair Mobility    Modified Rankin (Stroke Patients Only)       Balance     Sitting balance-Leahy Scale: Fair       Standing balance-Leahy Scale: Poor                      Cognition Arousal/Alertness: Awake/alert Behavior During Therapy: Flat affect Overall Cognitive Status: Impaired/Different  from baseline Area of Impairment: Attention;Memory;Awareness;Problem solving   Current Attention Level: Focused Memory: Decreased short-term memory     Awareness: Intellectual Problem Solving: Slow processing General Comments: in better spirits today    Exercises      General Comments        Pertinent Vitals/Pain Faces Pain Scale: No hurt    Home Living                      Prior Function            PT Goals (current goals can now be found in the care plan section) Acute Rehab PT Goals Patient Stated Goal: family like placement near spouse PT Goal Formulation: With patient/family Time For Goal Achievement: 10/11/15 Potential to Achieve Goals: Fair Progress towards PT goals: Progressing toward goals    Frequency  Min 2X/week    PT Plan Current plan remains appropriate    Co-evaluation PT/OT/SLP Co-Evaluation/Treatment: Yes Reason for Co-Treatment: Complexity of the patient's impairments (multi-system involvement) PT goals addressed during session: Mobility/safety with mobility       End of Session Equipment Utilized During Treatment: Gait belt Activity Tolerance: Patient tolerated treatment well Patient left: in chair;with call bell/phone within reach;with family/visitor present     Time: [id number] PT Time Calculation (min) (ACUTE ONLY): 16 min  Charges:  $Therapeutic Activity: 8-22 mins  G CodesDuncan Dull 2015/10/20, 4:59 PM Alben Deeds, [place] DPT  [phone]

## 2015-09-28 NOTE — Progress Notes (Signed)
Patient ID: Samantha Hudson, female   DOB: 03-Aug-1934, 81 y.o.   MRN: [id number]   LOS: 3 days   Subjective: Arouses easily, denies pain. Ox3.   Objective: Vital signs in last 24 hours: Temp:  [97.9 F (36.6 C)-98.6 F (37 C)] 98.6 F (37 C) (05/10 0848) Pulse Rate:  [89] 89 (05/09 2233) Resp:  [10-16] 10 (05/10 0624) BP: (108-154)/(52-68) 139/67 mmHg (05/10 0624) SpO2:  [89 %-98 %] 98 % (05/10 [id number])    Physical Exam General appearance: alert and no distress Resp: clear to auscultation bilaterally Cardio: regular rate and rhythm GI: normal findings: bowel sounds normal and soft, non-tender Neuro: A&Ox3   Assessment/Plan: Fall TBI w/SDH -- HCT stable, ST consult Left clavicle fx -- Sling for comfort per Dr. Marcelino Scot Multiple left rib fxs -- Pulmonary toilet ABL anemia -- Superimposed on mild chronic anemia, stable Multiple medical problems -- Home meds save Aggrenox FEN -- No issues VTE -- SCD's, Lovenox Dispo -- TBI team, transfer to floor, SNF when bed available, prefer [place] area    Lisette Abu, PA-C Pager: [phone] General Trauma PA Pager: [phone]  09/28/2015

## 2015-09-28 NOTE — Progress Notes (Signed)
Pt and dtr state she no longer takes keppra, please discuss with pt/family and d/c from profile.  Pt/dtr state tapperd off keppra and started lamictal, last time pt took keppra 3 weeks ago.

## 2015-09-28 NOTE — Care Management Important Message (Signed)
Important Message  Patient Details  Name: Samantha Hudson MRN: [id number] Date of Birth: 1935/01/10   Medicare Important Message Given:  Yes    Iktan Aikman Abena 09/28/2015, 1:15 PM

## 2015-09-28 NOTE — Progress Notes (Signed)
Report given to Remo Lipps, RN on unit [place], pt will transfer to med surg bed, room [phone]. Daughter at bedside and aware.

## 2015-09-28 NOTE — NC FL2 (Signed)
[place] LEVEL OF CARE SCREENING TOOL     IDENTIFICATION  Patient Name: Samantha Hudson Birthdate: March 11, 1935 Sex: female Admission Date (Current Location): 09/25/2015  [facility] and [place] Number:  Manufacturing engineer and Address:  [address] [facility], [place] [address], [place], [place] [address]      Provider Number: [id number]  Attending Physician Name and Address:  Trauma Md, MD  Relative Name and Phone Number:       Current Level of Care: Hospital Recommended Level of Care: [place] Prior Approval Number:    Date Approved/Denied:   PASRR Number: [id number]  Discharge Plan: SNF    Current Diagnoses: Patient Active Problem List   Diagnosis Date Noted  . Fracture of left clavicle 09/26/2015  . Fracture of multiple ribs of left side 09/26/2015  . Facial laceration 09/26/2015  . TBI (traumatic brain injury) ([place]) 09/26/2015  . Fall 09/25/2015  . Confusion   . Acute encephalopathy 09/17/2015  . Parkinson's disease ([place]) 09/17/2015  . Acute kidney injury ([place]) 09/17/2015  . Altered mental status 09/16/2015  . Anxiety 09/16/2015  . Paranoia ([place]) 09/16/2015  . CAD (coronary artery disease) 08/14/2015  . Chest pain 08/14/2015  . Constipation 08/08/2015  . Seizures ([place]) 06/14/2015  . Degenerative arthritis of thumb 03/03/2015  . Abnormality of gait 08/12/2014  . Parkinsonism ([place]) 08/12/2014  . Stroke ([place]) 08/12/2014  . Muscle weakness (generalized) 02/22/2014  . Stiffness of left knee 02/22/2014  . Hamstring tightness of left lower extremity 02/22/2014  . Pain in joint, pelvic region and thigh 02/22/2014  . Difficulty walking 04/07/2013  . Osteoporosis 03/10/2013  . Anemia 03/10/2013  . Mild malnutrition ([place]) 03/10/2013  . S/P left hip hemiarthroplasty 12/30/2012  . Osteoporosis with pathological fracture with delayed healing 11/25/2012  . Pedal edema 10/27/2012  . Fracture of sacrum ([place]) 10/02/2012  .  Stricture and stenosis of esophagus 09/24/2011  . Hyperlipidemia 11/03/2008  . Coronary atherosclerosis 11/03/2008    Orientation RESPIRATION BLADDER Height & Weight     Self  Normal Continent Weight: 101 lb 13.6 oz (46.2 kg) Height:  5\' 5"  (165.1 cm)  BEHAVIORAL SYMPTOMS/MOOD NEUROLOGICAL BOWEL NUTRITION STATUS    Convulsions/Seizures (History) Continent Diet (Lactose Intolerant)  AMBULATORY STATUS COMMUNICATION OF NEEDS Skin   Extensive Assist Verbally Normal                       Personal Care Assistance Level of Assistance  Bathing, Feeding, Dressing Bathing Assistance: Limited assistance Feeding assistance: Independent Dressing Assistance: Limited assistance     Functional Limitations Info  Sight, Hearing, Speech Sight Info: Adequate Hearing Info: Adequate Speech Info: Adequate    SPECIAL CARE FACTORS FREQUENCY  PT (By licensed PT), OT (By licensed OT)     PT Frequency: 3 OT Frequency: 3            Contractures Contractures Info: Not present    Additional Factors Info  Code Status, Allergies, Psychotropic Code Status Info: Full Code Allergies Info: Lactose Intolerance (Gi), Penicillins Psychotropic Info: Cymbalta, Seroquel         Current Medications (09/28/2015):  This is the current hospital active medication list Current Facility-Administered Medications  Medication Dose Route Frequency Provider Last Rate Last Dose  . ALPRAZolam Duanne Moron) tablet 0.25 mg  0.25 mg Oral QHS PRN Lisette Abu, PA-C      . atenolol (TENORMIN) tablet 12.5 mg  12.5 mg Oral BID Rolm Bookbinder, MD  12.5 mg at 09/27/15 2233  . atorvastatin (LIPITOR) tablet 10 mg  10 mg Oral QHS Lisette Abu, PA-C   10 mg at 09/27/15 2237  . calcium-vitamin D (OSCAL WITH D) 500-200 MG-UNIT per tablet 1 tablet  1 tablet Oral TID Lisette Abu, PA-C   1 tablet at 09/26/15 1641  . carbidopa-levodopa (SINEMET IR) 25-100 MG per tablet immediate release 2 tablet  2 tablet Oral TID  AC Rolm Bookbinder, MD   2 tablet at 09/28/15 0818  . cholecalciferol (VITAMIN D) tablet 400 Units  400 Units Oral Daily Lisette Abu, PA-C   400 Units at 09/26/15 1115  . docusate sodium (COLACE) capsule 100 mg  100 mg Oral BID Lisette Abu, PA-C   100 mg at 09/27/15 2238  . DULoxetine (CYMBALTA) DR capsule 30 mg  30 mg Oral Daily Rolm Bookbinder, MD   30 mg at 09/27/15 0859  . famotidine (PEPCID) tablet 40 mg  40 mg Oral Daily Altamese Bergen, MD   40 mg at 09/27/15 0858  . gabapentin (NEURONTIN) capsule 100 mg  100 mg Oral Daily Rolm Bookbinder, MD   100 mg at 09/27/15 1852  . gabapentin (NEURONTIN) capsule 200 mg  200 mg Oral Daily Rolm Bookbinder, MD   200 mg at 09/27/15 1215  . gabapentin (NEURONTIN) capsule 400 mg  400 mg Oral QHS Rolm Bookbinder, MD   400 mg at 09/27/15 2239  . haloperidol lactate (HALDOL) injection 2.5 mg  2.5 mg Intravenous Q4H PRN Stark Klein, MD      . LamoTRIgine XR TB24 200 mg  200 mg Oral QHS Romona Curls, RPH   200 mg at 09/27/15 2240  . levETIRAcetam (KEPPRA) tablet 500 mg  500 mg Oral BID Consuella Lose, MD   Stopped at 09/27/15 1943  . morphine 2 MG/ML injection 2 mg  2 mg Intravenous Q4H PRN Lisette Abu, PA-C      . ondansetron [facility]) tablet 4 mg  4 mg Oral Q6H PRN Rolm Bookbinder, MD       Or  . ondansetron [facility]) injection 4 mg  4 mg Intravenous Q6H PRN Rolm Bookbinder, MD   4 mg at 09/27/15 1039  . Pimavanserin Tartrate TABS 34 mg  34 mg Oral Daily Rolm Bookbinder, MD   34 mg at 09/27/15 1515  . polyethylene glycol (MIRALAX / GLYCOLAX) packet 17 g  17 g Oral Daily PRN Julianne Rice, MD      . polyvinyl alcohol (LIQUIFILM TEARS) 1.4 % ophthalmic solution 1 drop  1 drop Both Eyes PRN Rolm Bookbinder, MD      . potassium chloride (K-DUR) CR tablet 10 mEq  10 mEq Oral BID Lisette Abu, PA-C   10 mEq at 09/27/15 2241  . prochlorperazine (COMPAZINE) injection 10 mg  10 mg Intravenous Q4H PRN Lisette Abu, PA-C    10 mg at 09/27/15 1326  . QUEtiapine (SEROQUEL) tablet 25 mg  25 mg Oral QHS Rolm Bookbinder, MD   25 mg at 09/27/15 2242  . sodium chloride 0.9 % 1,000 mL infusion   Intravenous Continuous Lisette Abu, PA-C 50 mL/hr at 09/27/15 1743    . traMADol (ULTRAM) tablet 50-100 mg  50-100 mg Oral Q6H PRN Lisette Abu, PA-C   100 mg at 09/27/15 [id number]     Discharge Medications: Please see discharge summary for a list of discharge medications.  Relevant Imaging Results:  Relevant Lab Results:   Additional Information SSN [id number]  Cayleigh Paull, Francetta Found, LCSW

## 2015-09-28 NOTE — Progress Notes (Signed)
Speech Language Pathology Treatment: Cognitive-Linquistic  Patient Details Name: Samantha Hudson MRN: [id number] DOB: 10-01-1934 Today's Date: 09/28/2015 Time: [id number] SLP Time Calculation (min) (ACUTE ONLY): 28 min  Assessment / Plan / Recommendation Clinical Impression  Pt is much more interactive with therapy today, showing improved sustained attention, basic problem solving, and functional communication. She participates in basic functional tasks without cueing needed for the above. Emergent awareness of difficulties seems appropriate. Speech is mildly dysarthric, but suspect this is baseline as it is characteristic of Parkinson's disease. Pt's daughter believes she is progressing closer to baseline. Will continue to follow briefly to monitor and maximize functional potential.   HPI HPI: 81 yo s/p fall with L clavicle fx, TBI with SDH, multiple rib fx. PMH: parkinsons, brain surg for meningioma glaucoma, fibromyalgia, IBS, osteoporsis, CAD L hip hemi arthoplasty      SLP Plan  Goals updated     Recommendations  Medication Administration: Whole meds with liquid (larger pills whole in puree) Compensations: Minimize environmental distractions;Slow rate;Small sips/bites             Oral Care Recommendations: Oral care BID Follow up Recommendations: Skilled Nursing facility;24 hour supervision/assistance Plan: Goals updated     GO               Germain Osgood, M.A. CCC-SLP [phone]  Germain Osgood 09/28/2015, 12:07 PM

## 2015-09-29 ENCOUNTER — Encounter
Admission: RE | Admit: 2015-09-29 | Discharge: 2015-09-29 | Disposition: A | Discharge status: Home / Self Care | Payer: Medicare Other | Source: Ambulatory Visit | Attending: Internal Medicine | Admitting: Internal Medicine

## 2015-09-29 MED ORDER — TRAMADOL HCL 50 MG PO TABS: 50.0000 mg | ORAL_TABLET | Freq: Four times a day (QID) | ORAL | Status: DC | PRN

## 2015-09-29 MED ORDER — LEVETIRACETAM 500 MG PO TABS: 500.0000 mg | ORAL_TABLET | Freq: Two times a day (BID) | ORAL | Status: DC

## 2015-09-29 NOTE — Progress Notes (Signed)
Patient ID: Samantha Hudson, female   DOB: [birth year], 81 y.o.   MRN: [id number]   LOS: 4 days   Subjective: Looking much better today, up in chair eating breakfast.   Objective: Vital signs in last 24 hours: Temp:  [97.4 F (36.3 C)-98.6 F (37 C)] 97.7 F (36.5 C) (05/11 0619) Pulse Rate:  [71-80] 71 (05/11 0619) Resp:  [15-16] 16 (05/11 0619) BP: (108-143)/(38-63) 143/57 mmHg (05/11 0619) SpO2:  [96 %-100 %] 99 % (05/11 0619)    Laboratory  CBC  Recent Labs  09/27/15 0307  WBC 6.1  HGB 9.1*  HCT 30.0*  PLT 156    Physical Exam General appearance: alert and no distress Resp: clear to auscultation bilaterally Cardio: regular rate and rhythm   Assessment/Plan: Fall TBI w/SDH -- HCT stable, ST consult Left clavicle fx -- Sling for comfort per Dr. Marcelino Scot Multiple left rib fxs -- Pulmonary toilet ABL anemia -- Superimposed on mild chronic anemia, stable Multiple medical problems -- Home meds save Aggrenox FEN -- No issues VTE -- SCD's Dispo -- TBI team, SNF when bed available, prefer [place] area     Lisette Abu, PA-C Pager: [phone] General Trauma PA Pager: [phone]  09/29/2015

## 2015-09-29 NOTE — Discharge Summary (Signed)
Physician Discharge Summary  Patient ID: Samantha Hudson MRN: [id number] DOB/AGE: July 21, 1934 81 y.o.  Admit date: 09/25/2015 Discharge date: 09/29/2015  Discharge Diagnoses Patient Active Problem List   Diagnosis Date Noted  . Fracture of left clavicle 09/26/2015  . Fracture of multiple ribs of left side 09/26/2015  . Facial laceration 09/26/2015  . TBI (traumatic brain injury) ([place]) 09/26/2015  . Fall 09/25/2015  . Confusion   . Acute encephalopathy 09/17/2015  . Parkinson's disease ([place]) 09/17/2015  . Acute kidney injury ([place]) 09/17/2015  . Altered mental status 09/16/2015  . Anxiety 09/16/2015  . Paranoia ([place]) 09/16/2015  . CAD (coronary artery disease) 08/14/2015  . Chest pain 08/14/2015  . Constipation 08/08/2015  . Seizures ([place]) 06/14/2015  . Degenerative arthritis of thumb 03/03/2015  . Abnormality of gait 08/12/2014  . Parkinsonism ([place]) 08/12/2014  . Stroke ([place]) 08/12/2014  . Muscle weakness (generalized) 02/22/2014  . Stiffness of left knee 02/22/2014  . Hamstring tightness of left lower extremity 02/22/2014  . Pain in joint, pelvic region and thigh 02/22/2014  . Difficulty walking 04/07/2013  . Osteoporosis 03/10/2013  . Anemia 03/10/2013  . Mild malnutrition ([place]) 03/10/2013  . S/P left hip hemiarthroplasty 12/30/2012  . Osteoporosis with pathological fracture with delayed healing 11/25/2012  . Pedal edema 10/27/2012  . Fracture of sacrum ([place]) 10/02/2012  . Stricture and stenosis of esophagus 09/24/2011  . Hyperlipidemia 11/03/2008  . Coronary atherosclerosis 11/03/2008    Consultants Dr. Consuella Lose for neurosurgery  Dr. Altamese [place] for orthopedic surgery   Procedures None   HPI: Samantha Hudson sustained a fall at home where she lives with her husband and a caretaker.She had a history of Parkinson's, chronic pain, CAD with a stent and, about a decade ago, she had a meningioma excised that was complicated by a right MCA stroke.She has been on  Aggrenox since then and had some left-sided deficits.She did not really remember the fall. Her husband thinks she tripped on his walker.Her workup included CT scans of the head, face, cervical spine, and chest which showed the above-mentioned injuries. She was admitted to the trauma service and orthopedic surgery and neurosurgery were consulted.    Hospital Course: Neurosurgery recommended non-operative treatment for her brain injury. A follow-up head CT was stable and she remained with a GCS of 14-15 throughout her hospitalization. Orthopedic surgery also recommended non-operative treatment of her clavicle fracture. She was mobilized with the traumatic brain injury therapy team who recommended skilled nursing facility placement. She did have a period of about 36 hours during her stay where she was confused and paranoic but this passed without having to do any significant intervention. The family reports this happened at home on occasion as well, likely a symptoms of her Parkinson's disease. Once she improved she was able to be discharged to a facility in good condition.     Medication List    STOP taking these medications        dipyridamole-aspirin 200-25 MG 12hr capsule  Commonly known as:  AGGRENOX      TAKE these medications        ALPRAZolam 0.25 MG tablet  Commonly known as:  XANAX  Take 1 tablet (0.25 mg total) by mouth at bedtime as needed for anxiety.     atenolol 25 MG tablet  Commonly known as:  TENORMIN  Take 12.5 mg by mouth 2 (two) times daily.     atorvastatin 10 MG tablet  Commonly known as:  LIPITOR  TAKE 1 TABLET  AT BEDTIME.     calcium-vitamin D 500-200 MG-UNIT tablet  Commonly known as:  OSCAL WITH D  Take 1 tablet by mouth 3 (three) times daily.     carbidopa-levodopa 25-100 MG tablet  Commonly known as:  SINEMET IR  TAKE (2) TABLETS THREE TIMES DAILY.     cholecalciferol 400 units Tabs tablet  Commonly known as:  VITAMIN D  Take 400 Units by mouth  daily.     DULoxetine 30 MG capsule  Commonly known as:  CYMBALTA  TAKE (1) CAPSULE DAILY.     gabapentin 400 MG capsule  Commonly known as:  NEURONTIN  Take 400 mg by mouth at bedtime.     gabapentin 100 MG capsule  Commonly known as:  NEURONTIN  TAKE 2 CAPSULE IN THE MORNING AND 1 CAPSULE IN THE AFTERNNON     hydroxypropyl methylcellulose / hypromellose 2.5 % ophthalmic solution  Commonly known as:  ISOPTO TEARS / GONIOVISC  Place 1 drop into both eyes as needed for dry eyes. Reported on 06/20/2015     LamoTRIgine XR 200 MG Tb24  Take 1 tablet (200 mg total) by mouth at bedtime.     levETIRAcetam 500 MG tablet  Commonly known as:  KEPPRA  Take 1 tablet (500 mg total) by mouth 2 (two) times daily.     Pimavanserin Tartrate 17 MG Tabs  Commonly known as:  NUPLAZID  Take 34 mg by mouth daily.     polyethylene glycol powder powder  Commonly known as:  GLYCOLAX/MIRALAX  Take 17 g by mouth daily as needed for mild constipation or moderate constipation.     potassium chloride 10 MEQ tablet  Commonly known as:  K-DUR  TAKE (1) TABLET TWICE A DAY.     QUEtiapine 25 MG tablet  Commonly known as:  SEROQUEL  Take one to 4 tabs po qhs     ranitidine 300 MG tablet  Commonly known as:  ZANTAC  TAKE 1 TABLET DAILY AS DIRECTED     RECLAST 5 MG/100ML Soln injection  Generic drug:  zoledronic acid  Inject 5 mg into the vein once. Takes yearly in December     torsemide 20 MG tablet  Commonly known as:  DEMADEX  TAKE 1/2 TABLET EACH MORNING AS NEEDED FOR FLUID     traMADol 50 MG tablet  Commonly known as:  ULTRAM  Take 1-2 tablets (50-100 mg total) by mouth every 6 (six) hours as needed (Pain).            Follow-up Information    Schedule an appointment as soon as possible for a visit with Rozanna Box, MD.   Specialty:  Orthopedic Surgery   Contact information:   [facility] [address] [place] [address] [phone]       Schedule an appointment as  soon as possible for a visit with Jairo Ben, MD.   Specialty:  Neurosurgery   Contact information:   [address] [address] [phone]       Call [facility].   Why:  As needed   Contact information:   [address] [id number] [place] [phone]       Signed: Lisette Abu, PA-C Pager: [phone] General Trauma PA Pager: [phone] 09/29/2015, 12:56 PM

## 2015-09-29 NOTE — Progress Notes (Signed)
[place] to inquire about the status of patient's pickup to [facility] since expected pickup time was 1700.  [facility] updated this RN that it will take about 30 mins. - 1 hr for [facility] to pick the patient at [facility]. Patient and daughter updated of transport status accordingly.

## 2015-09-29 NOTE — Progress Notes (Signed)
09/29/15  1700 Copy of discharge instructions given to daughters.

## 2015-09-29 NOTE — Progress Notes (Signed)
Patient just picked up by [facility] for transport to [facility] in [place].  Administered PRN medication Tramadol 50 mg. PO as requested by daughter prior to transport.  Discharge documents with [facility] personnel.

## 2015-09-29 NOTE — Progress Notes (Signed)
09/29/15  1630  Report called to [facility] [phone]. Report given to [facility], [place].

## 2015-09-29 NOTE — Clinical Social Work Placement (Signed)
   CLINICAL SOCIAL WORK PLACEMENT  NOTE  Date:  09/29/2015  Patient Details  Name: [name] MRN: [id number] Date of Birth: 03-Mar-1935  Clinical Social Work is seeking post-discharge placement for this patient at the [place] level of care (*CSW will initial, date and re-position this form in  chart as items are completed):  Yes   Patient/family provided with [place] Work Department's list of facilities offering this level of care within the geographic area requested by the patient (or if unable, by the patient's family).  Yes   Patient/family informed of their freedom to choose among providers that offer the needed level of care, that participate in Medicare, Medicaid or managed care program needed by the patient, have an available bed and are willing to accept the patient.  Yes   Patient/family informed of [facility]'s ownership interest in [facility] and [facility], as well as of the fact that they are under no obligation to receive care at these facilities.  PASRR submitted to EDS on       PASRR number received on       Existing PASRR number confirmed on 09/28/15     FL2 transmitted to all facilities in geographic area requested by pt/family on 09/28/15     FL2 transmitted to all facilities within larger geographic area on       Patient informed that his/her managed care company has contracts with or will negotiate with certain facilities, including the following:        Yes   Patient/family informed of bed offers received.  Patient chooses bed at [facility]     Physician recommends and patient chooses bed at      Patient to be transferred to [facility] on 09/29/15.  Patient to be transferred to facility by Ambulance     Patient family notified on 09/29/15 of transfer.  Name of family member notified:  Patient daughters and spouse at bedside     PHYSICIAN       Additional Comment:    Barbette Or,  [place]

## 2015-09-29 NOTE — Clinical Social Work Note (Signed)
Clinical Social Work Assessment  Patient Details  Name: Samantha Hudson MRN: [id number] Date of Birth: 1935-03-27  Date of referral:  09/29/15               Reason for consult:  Trauma, Facility Placement                Permission sought to share information with:  Family Supports Permission granted to share information::  Yes, Verbal Permission Granted  Name::     Krystian Ferrentino  Relationship::  Spouse  Contact Information:  [phone]  Housing/Transportation Living arrangements for the past 2 months:  [place] of Information:  Patient, Spouse, Adult Children Patient Interpreter Needed:  None Criminal Activity/Legal Involvement Pertinent to Current Situation/Hospitalization:  No - Comment as needed Significant Relationships:  Spouse, Adult Children Lives with:    Do you feel safe going back to the place where you live?  Yes Need for family participation in patient care:  Yes (Comment)  Care giving concerns:  Patient daughters present with concerns regarding patient and patient spouse ability to continue to live alone.  Patient and spouse with a live in caregiver, however it is not enough hands on assistance.  Patient daughters are hopeful to transition both patient and spouse into a community that has all levels of care available.   Social Worker assessment / plan:  Holiday representative met with patient, patient spouse, and patient daughters at bedside to offer support and discuss patient needs at discharge.  Patient and patient family all agreeable that patient could benefit from a short rehab stay prior to transition.  Patient and patient spouse are also in agreement that they need to start the process for them to transition into more of an assisted living environment.  Patient daughter is moving from [place] to [place] and is hopeful for [facility] [facility] to have an available SNF bed with the potential for patient and spouse to transition to another level of care at  the same facility.  CSW initiated bed search to [place], [place], and [place] per patient family request.  CSW to follow up with patient and family to provide available bed offers and facilitate patient discharge needs once medically stable.  Employment status:  Retired Forensic scientist:  Medicare PT Recommendations:  [place] / Referral to community resources:  [place]  Patient/Family's Response to care:  Patient and family verbalized understanding of CSW role and appreciation for support and involvement.  Patient and family all in agreement for SNF placement at this time and are willing to do a large bed search to accommodate all areas.  Patient/Family's Understanding of and Emotional Response to Diagnosis, Current Treatment, and Prognosis:  Patient and patient spouse with limited understanding of her potential barriers and need for additional assistance, however patient daughters are very much on board and willing to provide guidance as needed.  Emotional Assessment Appearance:  Appears older than stated age Attitude/Demeanor/Rapport:  Lethargic (Limited engagement ) Affect (typically observed):  Accepting, Calm Orientation:  Oriented to Self, Oriented to Place, Oriented to  Time, Oriented to Situation Alcohol / Substance use:  Never Used Psych involvement (Current and /or in the community):  No (Comment)  Discharge Needs  Concerns to be addressed:  Discharge Planning Concerns Readmission within the last 30 days:  Yes Current discharge risk:  Physical Impairment, Cognitively Impaired Barriers to Discharge:  Continued Medical Work up  [employer], [place]

## 2015-09-29 NOTE — Clinical Social Work Note (Signed)
Clinical Social Worker facilitated patient discharge including contacting patient family and facility to confirm patient discharge plans.  Clinical information faxed to facility and family agreeable with plan.  CSW arranged ambulance transport via [facility] to [facility].  RN to call report prior to discharge.  Clinical Social Worker will sign off for now as social work intervention is no longer needed. Please consult [place] again if new need arises.  Barbette Or, [place]

## 2015-09-29 NOTE — Care Management Note (Signed)
Case Management Note  Patient Details  Name: JASIRI SANDIN MRN: [id number] Date of Birth: June 28, 1934  Subjective/Objective:   Pt medically stable for dc today.                   Action/Plan: Plan dc to SNF today, per CSW arrangements.    Expected Discharge Date:    09/29/15              Expected Discharge Plan:  Skilled Nursing Facility  In-House Referral:  Clinical Social Work  Discharge planning Services  CM Consult  Post Acute Care Choice:    Choice offered to:     DME Arranged:    DME Agency:     HH Arranged:    [place] Agency:     Status of Service:  Completed, signed off  Medicare Important Message Given:  Yes Date Medicare IM Given:    Medicare IM give by:    Date Additional Medicare IM Given:    Additional Medicare Important Message give by:     If discussed at [place] of Stay Meetings, dates discussed:    Additional Comments:  Reinaldo Raddle, RN, BSN  Trauma/Neuro ICU Case Manager [phone]

## 2015-09-30 ENCOUNTER — Telehealth: Payer: Self-pay | Admitting: Neurology

## 2015-09-30 NOTE — Telephone Encounter (Signed)
I have called her daughter Judson Roch, she is now at rehab, conservative treatment for fracture, taking tramadol.  She was put on Keppra,I have suggested her stop keppra, keep Lamictal 200mg  daily, she was on Lamictal xr, may switch to lamictal 100mg  bid with her swallowing difficulties.

## 2015-09-30 NOTE — Telephone Encounter (Signed)
They want Dr. Krista Blue to know that the patient fell on Sunday level 2 TBI 3 broken ribs, broken collar bone and fracture sternum.. Because of all of this is in rehab. They are trying to figure out what the neck steps are. The best number to contact is Judson Roch [phone]

## 2015-10-03 ENCOUNTER — Ambulatory Visit (HOSPITAL_COMMUNITY): Payer: Medicare Other | Admitting: Physical Therapy

## 2015-10-03 ENCOUNTER — Telehealth: Payer: Self-pay | Admitting: Neurology

## 2015-10-03 NOTE — Telephone Encounter (Signed)
Patient's husband Samantha Hudson is calling and states his wife is running out of the Plavix samples and she now needs a Rx.  Please call.

## 2015-10-03 NOTE — Telephone Encounter (Signed)
Correction on medication name - she is out of Nuplazid.  The PA has been approved with her insurance company - called [employer] [phone]) and they left a message at her home number on 09/30/15.  I faxed them a copy of her HIPPA form so they could contact the patient's husband to schedule the medication delivery. I also called Nu[facility] [id number]) and requested they overnight her an additional two weeks of medication samples.  I spoke to Horald Chestnut (husband on HIPPA) to let him know to expect the delivery and calls from the pharmacy.

## 2015-10-04 ENCOUNTER — Ambulatory Visit: Payer: Self-pay | Admitting: Neurology

## 2015-10-04 ENCOUNTER — Telehealth: Payer: Self-pay | Admitting: *Deleted

## 2015-10-04 NOTE — Telephone Encounter (Signed)
Last OV 09/05/2015, MD requested patient have Lexiscan stress test.  Test has not been completed yet.  3 month follow up scheduled for 12/07/2015 in [place] office.  Attempted to contact patient, left message for a return call.

## 2015-10-05 ENCOUNTER — Ambulatory Visit (HOSPITAL_COMMUNITY): Payer: Medicare Other | Admitting: Physical Therapy

## 2015-10-05 ENCOUNTER — Ambulatory Visit (HOSPITAL_COMMUNITY): Payer: Medicare Other | Admitting: Occupational Therapy

## 2015-10-06 ENCOUNTER — Encounter: Payer: Self-pay | Admitting: *Deleted

## 2015-10-06 NOTE — Telephone Encounter (Signed)
No return call received to date.  Letter mailed to patient.

## 2015-10-10 ENCOUNTER — Ambulatory Visit (HOSPITAL_COMMUNITY): Payer: Medicare Other | Admitting: Physical Therapy

## 2015-10-10 LAB — URINALYSIS COMPLETE WITH MICROSCOPIC (ARMC ONLY)
Bilirubin Urine: NEGATIVE
Glucose, UA: NEGATIVE mg/dL
HGB URINE DIPSTICK: NEGATIVE
Nitrite: NEGATIVE
PH: 6 (ref 5.0–8.0)
PROTEIN: 30 mg/dL — AB
Specific Gravity, Urine: 1.019 (ref 1.005–1.030)

## 2015-10-11 LAB — COMPREHENSIVE METABOLIC PANEL
ALBUMIN: 3.6 g/dL (ref 3.5–5.0)
ALT: 6 U/L — AB (ref 14–54)
AST: 15 U/L (ref 15–41)
Alkaline Phosphatase: 162 U/L — ABNORMAL HIGH (ref 38–126)
Anion gap: 5 (ref 5–15)
BUN: 24 mg/dL — AB (ref 6–20)
CHLORIDE: 108 mmol/L (ref 101–111)
CO2: 27 mmol/L (ref 22–32)
CREATININE: 0.61 mg/dL (ref 0.44–1.00)
Calcium: 8.9 mg/dL (ref 8.9–10.3)
GFR calc Af Amer: >60 mL/min (ref >60)
GFR calc non Af Amer: >60 mL/min (ref >60)
Glucose, Bld: 79 mg/dL (ref 65–99)
POTASSIUM: 3.7 mmol/L (ref 3.5–5.1)
SODIUM: 140 mmol/L (ref 135–145)
Total Bilirubin: 0.7 mg/dL (ref 0.3–1.2)
Total Protein: 6.9 g/dL (ref 6.5–8.1)

## 2015-10-11 LAB — CBC WITH DIFFERENTIAL/PLATELET
BASOS ABS: 0 10*3/uL (ref 0–0.1)
Eosinophils Absolute: 0.2 10*3/uL (ref 0–0.7)
HCT: 34 % — ABNORMAL LOW (ref 35.0–47.0)
Hemoglobin: 11.1 g/dL — ABNORMAL LOW (ref 12.0–16.0)
Lymphs Abs: 0.8 10*3/uL — ABNORMAL LOW (ref 1.0–3.6)
MCH: 29.8 pg (ref 26.0–34.0)
MCHC: 32.5 g/dL (ref 32.0–36.0)
MCV: 91.7 fL (ref 80.0–100.0)
MONO ABS: 0.6 10*3/uL (ref 0.2–0.9)
Monocytes Relative: 6 %
Neutro Abs: 7.9 10*3/uL — ABNORMAL HIGH (ref 1.4–6.5)
Neutrophils Relative %: 82 %
PLATELETS: 285 10*3/uL (ref 150–440)
RBC: 3.71 MIL/uL — AB (ref 3.80–5.20)
RDW: 13.5 % (ref 11.5–14.5)
WBC: 9.5 10*3/uL (ref 3.6–11.0)

## 2015-10-12 ENCOUNTER — Ambulatory Visit (HOSPITAL_COMMUNITY): Payer: Medicare Other | Admitting: Physical Therapy

## 2015-10-12 ENCOUNTER — Encounter (HOSPITAL_COMMUNITY): Payer: Medicare Other | Admitting: Occupational Therapy

## 2015-10-13 LAB — URINE CULTURE

## 2015-10-14 ENCOUNTER — Ambulatory Visit: Payer: Medicare Other | Admitting: Family Medicine

## 2015-10-18 ENCOUNTER — Encounter (HOSPITAL_COMMUNITY): Payer: Medicare Other | Admitting: Occupational Therapy

## 2015-10-18 ENCOUNTER — Other Ambulatory Visit: Payer: Self-pay | Admitting: Internal Medicine

## 2015-10-18 ENCOUNTER — Ambulatory Visit (HOSPITAL_COMMUNITY): Payer: Medicare Other | Admitting: Physical Therapy

## 2015-10-18 DIAGNOSIS — R1310 Dysphagia, unspecified: Secondary | ICD-10-CM

## 2015-10-20 ENCOUNTER — Encounter (HOSPITAL_COMMUNITY): Payer: Medicare Other | Admitting: Occupational Therapy

## 2015-10-20 ENCOUNTER — Encounter
Admission: RE | Admit: 2015-10-20 | Discharge: 2015-10-20 | Disposition: A | Discharge status: Home / Self Care | Payer: Medicare Other | Source: Ambulatory Visit | Attending: Internal Medicine | Admitting: Internal Medicine

## 2015-10-20 ENCOUNTER — Ambulatory Visit (HOSPITAL_COMMUNITY): Payer: Medicare Other | Admitting: Physical Therapy

## 2015-10-20 DIAGNOSIS — R42 Dizziness and giddiness: Secondary | ICD-10-CM | POA: Insufficient documentation

## 2015-10-20 DIAGNOSIS — R531 Weakness: Secondary | ICD-10-CM | POA: Insufficient documentation

## 2015-10-21 ENCOUNTER — Telehealth: Payer: Self-pay | Admitting: *Deleted

## 2015-10-21 NOTE — Telephone Encounter (Signed)
Message For: OFFICE               Taken  2-JUN-17 at 10:22AM by [facility] ------------------------------------------------------------ [place]     CID [id number]  Patient Samantha Hudson        Pt's Dr Krista Blue          Area Code 336 Phone# [phone], WANT TO PUT ON RX TO     INCREASE HER APPETITE, PLS C/B                       Disp:Y/N N If Y = C/B If No Response In 108minutes ============================================================

## 2015-10-24 ENCOUNTER — Ambulatory Visit (HOSPITAL_COMMUNITY): Payer: Medicare Other | Admitting: Physical Therapy

## 2015-10-24 ENCOUNTER — Encounter (HOSPITAL_COMMUNITY): Payer: Medicare Other

## 2015-10-24 NOTE — Telephone Encounter (Addendum)
I have talked with her husband, she is still at rehabilitation, she was noted to have increased confusion last Friday June second 2017, wandered outside of her facility, she is still taking the Nuplazid, now 4 weeks, did not notice any significant changes in her agitation,  She was given Xanax on Friday evening, slept well on Friday and Saturday, missing her medications,  Selinda Eon, please check on patient's again late afternoon, after husband visited her again today, if he wished, I can see her in my clinic earlier,

## 2015-10-24 NOTE — Telephone Encounter (Signed)
Spoke to Mr. Boom - he is with his wife now and says she is having a good day.  She has a pending swallowing test on 10/27/15 and would like for her to see you the same day (since she will already be out).  She has been added to the schedule.

## 2015-10-27 ENCOUNTER — Ambulatory Visit (HOSPITAL_COMMUNITY): Payer: Medicare Other | Admitting: Physical Therapy

## 2015-10-27 ENCOUNTER — Ambulatory Visit (HOSPITAL_COMMUNITY)
Admission: RE | Admit: 2015-10-27 | Discharge: 2015-10-27 | Disposition: A | Discharge status: Home / Self Care | Payer: No Typology Code available for payment source | Source: Ambulatory Visit | Attending: Internal Medicine | Admitting: Internal Medicine

## 2015-10-27 ENCOUNTER — Encounter (HOSPITAL_COMMUNITY): Payer: Medicare Other | Admitting: Occupational Therapy

## 2015-10-27 ENCOUNTER — Encounter: Payer: Self-pay | Admitting: Neurology

## 2015-10-27 ENCOUNTER — Ambulatory Visit (INDEPENDENT_AMBULATORY_CARE_PROVIDER_SITE_OTHER): Payer: Medicare Other | Admitting: Neurology

## 2015-10-27 VITALS — BP 73/42 | HR 70 | Ht 65.0 in | Wt 94.6 lb

## 2015-10-27 DIAGNOSIS — Z880 Allergy status to penicillin: Secondary | ICD-10-CM | POA: Insufficient documentation

## 2015-10-27 DIAGNOSIS — R1313 Dysphagia, pharyngeal phase: Secondary | ICD-10-CM | POA: Insufficient documentation

## 2015-10-27 DIAGNOSIS — G20 Parkinson's disease: Secondary | ICD-10-CM

## 2015-10-27 DIAGNOSIS — M4854XA Collapsed vertebra, not elsewhere classified, thoracic region, initial encounter for fracture: Secondary | ICD-10-CM | POA: Insufficient documentation

## 2015-10-27 DIAGNOSIS — R569 Unspecified convulsions: Secondary | ICD-10-CM | POA: Diagnosis not present

## 2015-10-27 DIAGNOSIS — R1312 Dysphagia, oropharyngeal phase: Secondary | ICD-10-CM | POA: Insufficient documentation

## 2015-10-27 DIAGNOSIS — I63511 Cerebral infarction due to unspecified occlusion or stenosis of right middle cerebral artery: Secondary | ICD-10-CM

## 2015-10-27 DIAGNOSIS — R1310 Dysphagia, unspecified: Secondary | ICD-10-CM

## 2015-10-27 NOTE — Progress Notes (Signed)
MBSS complete. Full report located under chart review in imaging section.    Pt exhbited a mild-mod motor based pharygeal dysphagia marked by decreased laryngeal elevation resulting in penetration and silent aspiraiton with thin barium. Therapeutic intervention implemented a chin tuck posture consistently prevented airway compromise. Initially requiring moderate verbal reminders/cues fading to mild for carryover of strategy. Family reports hiring a caregiver for the SNF, therefore pt will have full supervision during meals. Mild vallecular residue due to reduced tongue base retraction. Mildly delayed oral mastication and transit with solid. Recommend Dys 2 (minced/finely chopped), thin liquids, chin tuck (with liquids only), small sips, no straws, and pills whole in applesauce.    [place] Mon[employer] [phone]

## 2015-10-27 NOTE — Progress Notes (Signed)
Chief Complaint  Patient presents with  . Parkisonism/memory loss    MMSE 22/30 - 11 animals.  She is here with her husband, Samantha Hudson and her daughter, Samantha Hudson.  Her family feels her memory has continued to decline.  She is still in rehab at [facility] at [place].  She has been trying to get up unassisted and refusing her medications.  She is still working with both PT and OT.  Her BP has been running low recently.     [facility]  PATIENT: Samantha Hudson DOB: 1934-11-23   REASON FOR VISIT: Follow-up for Parkinson's disease, abnormality of gait, delirium HISTORY FROM: Husband and daughters    HISTORY OF PRESENT ILLNESS: HISTORY Samantha Hudson is a 81 years old right-handed Caucasian female, accompanied by her husband, a retired Software engineer, referred by her primary care physician Dr. Sallee Lange for evaluation of worsening gait difficulty, difficulty initiate gait She had a history of right meningioma, status post resection in 2007, 2 days later, she suffered a large right MCA stroke, also with past medical history of coronary artery disease, status post stent placement, she recovered very well, was able to drive, ambulate without assistance, but with residual left hemiparesthesia, often unpleasant deep achy pain involving left side of her body, She had osteoporosis, left hip fracture in 2014, require replacement in August 2014, since surgery, she had a great decline of her ambulatory ability, she now rely on her walker In 2014, she also developed loss sense of smell, REM sleep disorder, screaming out of her dreams, mild constipation, now she noticed right hand tremor, small handwriting since 2015, mild memory trouble, worsening gait difficulty, difficulty initiate walking using her right leg, tendency to lean backwards, worsening left-sided pain, especially around her left hip, left anterior thigh. She is taking gabapentin, which has been helpful, but 400 mg 4 times a day,  will make her sleepy, she is only taking 900 mg daily now, continue have excessive fatigue, daytime sleepiness, difficult to read, double vision.  We have reviewed MRI of the brain August 04 2014, demonstrate large size right MCA stroke, involving right medial, and lateral temporal lobe, no acute lesions.  April 20 first 2016 Since her initial visit August 12 2014, because of mild parkinsonian features, I have started Sinemet 25/100 one tablet 3 times a day, she is taking it at 10, 3, and 9 PM, no significant improvement, no significant side effect, she complains mild low back pain, continue significant gait difficulty, difficulty picking up her right leg  UPDATE November 11 2014: She can move better after Sinemet dosage was increased to 25/100 mg 4 times a day, physical therapy was helpful, husband reported that she takes Sinemet 1 hour before any meal, become center of her daily activity, She denies significant memory trouble, complains of chronic neck pain, bilateral shoulder pain, left hip pain, low back pain, We have reviewed her MRI lumbar in May 2016, There is lumbosacral transitional anatomy. This report assumes that there are 5 lumbar type vertebral bodies. Recommend close correlation with radiographs if intervention is elected. Moderate multilevel lumbar spondylosis. Degenerative disease is most pronounced at L3-L4 potentially affecting both exiting L3 nerves. Healed sacral insufficiency fractures demonstrated on prior nuclear medicine bone scan.  UPDATE Mar 14 2015: Around Sep 2016, she woke up one night she has bite her left lateral tongue, maybe with urinary incontinence, she is at risk for seizure, we have personally reviewed MRI of the brain with and without contrast in March  2016, large right MCA encephalomalacia involving right temporal parietal frontal lobe.She is taking Sinemet 25/100 ii tid, at 7, 12, 5 PM, which does help her parkinsonian features, she has occasionally upset stomach,  husband is concerned about potential interaction of Azilect with tramadol, which she is taking as needed for low back pain  UPDATE Jun 14 2015:She has mild difficult with swallowing, only limited to take medication tablet, has no difficulty swallowing her food, Since Keppra XL our 750 mg was started in October 2016, she had one more episodes of woke up has bite her tongue in December 2016, husband also noticed she has increased agitations, continue has mild unsteady gait, left-sided low back pain, hip pain  UPDATE March 29th 2017: She only tried lamotrigine ER 50 mg every night in January, decided not to take it, worried about the side effects, she now taking Sinemet 25/100 mg 2 tablets 3 times a day, tolerating it well, ambulate much better, she can walk 1 mile now She has no recurrent seizure, she did presented to emergency room in August 13 2015 for complaints of shortness of breath, chest pain, troponin was negative, Echocardiogram showed ejection fraction 60%, laboratory showed normal BMP, mild anemia, hemoglobin of 10 point 5.Sinemet and physical therapy has helped her walking UPDATE  09/19/2015 CMMs. [place]in 81 year old female returns for follow-up. She had a hospital admission to [facility] over the weekend for 2 days with diagnosis of acute encephalopathy and paranoia  And acute  kidney injury. She was disoriented. Her lamotrigine was changed about a month ago and her Keppra was slowly titrated. Her husband reports one episode of biting her time in the last month. CT of the head the hospital revealed no acute intracranial abnormalities but extensive encephalomalacia. UTI was ruled out. She has a canvas bag with her with her bill fold and a nightgown and she will not let the family look at this.She does not want the husband to leave her sight. She is refusing to take medications. She also has a history of Parkinson's disease and is on carbidopa levodopa. She has a history of stroke is also on Aggrenox  twice daily . She has a 24 7 caregiver .The family called in this morning wanted her to be seen on an urgent basis. Update June eighth 2017:  She was last seen by nurse practitioner Hoyle Sauer in June first 2017 for worsening paranoid, agitation, difficulty sleeping we started low-dose seroquel along with Nuplazid Unfortunately, she fell at May 5th, 6th and 7th, was taken to emergency room, found she has broken multiple ribs, sternum. She is now discharged to rehab, was noted to have increased confusion, agitation, paranoid, she was off Nuplazid since June 5th, was started on Zyprexa 5mg  qhs, with no clear improvement, also was given xanax prn. Her behavior issue is so dominant in current situation, there was a discussion of possible memory units placement versus hospice care,  I personally reviewed CAT scan of the brain without contrast in May eighth 2017: Unchanged extra-axial blood overlying the left cerebral hemisphere to a depth of 3-4 mm. Large right side encephalomalacia, evidence of right temporoparietal craniotomy.  REVIEW OF SYSTEMS: Full 14 system review of systems performed and notable only for those listed, all others are neg:   Activity change, unexpected weight change, trouble swallowing, drooling, walking difficulty, memory loss, dizziness, weakness, agitation,   ALLERGIES: Allergies  Allergen Reactions  . Lactose Intolerance (Gi) Nausea And Vomiting  . Penicillins Rash    Has patient had  a PCN reaction causing immediate rash, facial/tongue/throat swelling, SOB or lightheadedness with hypotension: NO Has patient had a PCN reaction causing severe rash involving mucus membranes or skin necrosis: NO Has patient had a PCN reaction that required hospitalization: NO Has patient had a PCN reaction occurring within the last 10 years: NO If all of the above answers are "NO", then may proceed with Cephalosporin use. Pt can tolerate cephalosporins    HOME MEDICATIONS: Outpatient  Prescriptions Prior to Visit  Medication Sig Dispense Refill  . ALPRAZolam (XANAX) 0.25 MG tablet Take 1 tablet (0.25 mg total) by mouth at bedtime as needed for anxiety. 30 tablet 5  . atenolol (TENORMIN) 25 MG tablet Take 12.5 mg by mouth 2 (two) times daily.    Marland Kitchen atorvastatin (LIPITOR) 10 MG tablet TAKE 1 TABLET AT BEDTIME. 30 tablet 5  . calcium-vitamin D (OSCAL WITH D) 500-200 MG-UNIT per tablet Take 1 tablet by mouth 3 (three) times daily.     . carbidopa-levodopa (SINEMET IR) 25-100 MG tablet TAKE (2) TABLETS THREE TIMES DAILY. 180 tablet 11  . cholecalciferol (VITAMIN D) 400 UNITS TABS tablet Take 400 Units by mouth daily.    . DULoxetine (CYMBALTA) 30 MG capsule TAKE (1) CAPSULE DAILY. 30 capsule 5  . gabapentin (NEURONTIN) 100 MG capsule TAKE 2 CAPSULE IN THE MORNING AND 1 CAPSULE IN THE AFTERNNON (Patient taking differently: TAKE 2 CAPSULE IN THE MORNING AND 1 CAPSULE IN THE AFTERnoon,) 90 capsule 5  . gabapentin (NEURONTIN) 400 MG capsule Take 400 mg by mouth at bedtime.    . hydroxypropyl methylcellulose / hypromellose (ISOPTO TEARS / GONIOVISC) 2.5 % ophthalmic solution Place 1 drop into both eyes as needed for dry eyes. Reported on 06/20/2015    . LamoTRIgine XR 200 MG TB24 Take 1 tablet (200 mg total) by mouth at bedtime. 90 tablet 4  . Pimavanserin Tartrate (NUPLAZID) 17 MG TABS Take 34 mg by mouth daily. 60 tablet 11  . polyethylene glycol powder (GLYCOLAX/MIRALAX) powder Take 17 g by mouth daily as needed for mild constipation or moderate constipation.    . potassium chloride (K-DUR) 10 MEQ tablet TAKE (1) TABLET TWICE A DAY. 60 tablet 5  . QUEtiapine (SEROQUEL) 25 MG tablet Take one to 4 tabs po qhs (Patient taking differently: Take 25 mg by mouth at bedtime. Take one to 4 tabs po qhs) 60 tablet 3  . ranitidine (ZANTAC) 300 MG tablet TAKE 1 TABLET DAILY AS DIRECTED (Patient taking differently: TAKE 1 TABLET DAILY) 30 tablet 5  . torsemide (DEMADEX) 20 MG tablet TAKE 1/2 TABLET  EACH MORNING AS NEEDED FOR FLUID (Patient taking differently: Take 20 mg by mouth daily as needed. TAKE 1/2 TABLET EACH MORNING AS NEEDED FOR FLUID)    . traMADol (ULTRAM) 50 MG tablet Take 1-2 tablets (50-100 mg total) by mouth every 6 (six) hours as needed (Pain). 24 tablet 0  . zoledronic acid (RECLAST) 5 MG/100ML SOLN injection Inject 5 mg into the vein once. Takes yearly in December     No facility-administered medications prior to visit.    PAST MEDICAL HISTORY: Past Medical History  Diagnosis Date  . Hyperlipemia   . IBS (irritable bowel syndrome)   . Osteoporosis   . Fibromyalgia   . Glaucoma     HAD LASER SURGERY - NO EYE DROPS REQUIRED  . Globus hystericus   . Fibromyalgia   . Meningioma ([place])   . CAD (coronary artery disease)     HEART STENT PLACED ABOUT  2000- NO LONGER SEES CARDIOLOGIST; NO C/O OF CHEST PAINS OR SOB  . Heart murmur     MVP SINCE THE 60'S - TAKES ATENOLOL -  . Anxiety   . Swelling     BILATERAL FEET AND LEGS - ONSET OF SWELLING APRIL 2014  . GERD (gastroesophageal reflux disease)   . Arthritis   . Fracture   . CVA (cerebral vascular accident) ([place])     STROKE 7 Harper TO REMOVE BENIGN MENINGIOMA - HAS LEFT SIDED WEAKNESS AND A LITTLE NUMBNESS  . Pain     "EXCRUCIATING PAIN" LEFT HIP - HAS A FRACTURE - IS CONFINED TO W/C AT HOME - NO WEIGHT BEARING LEFT HIP.  Marland Kitchen Parkinson's disease ([place])     PAST SURGICAL HISTORY: Past Surgical History  Procedure Laterality Date  . Brain surgery      tumor removed  . Tonsillectomy    . Partial hysterectomy    . Glaucoma repair    . Cataract extraction    . Kidney surgery      left - HX ENLARGED LEFT KIDNEY ON XRAY - SURGERY WAS DONE ON URETER   . Colonoscopy    . Coronary angioplasty    . Hip arthroplasty Left 12/29/2012    Procedure: LEFT HIP HEMI ARTHROPLASTY ;  Surgeon: Mauri Pole, MD;  Location: [facility];  Service: Orthopedics;  Laterality: Left;    FAMILY HISTORY: Family  History  Problem Relation Age of Onset  . Liver disease Father   . Diabetes Father   . Coronary artery disease Mother     deseased  . Heart attack Mother   . Hyperlipidemia Mother   . Breast cancer Sister     x 2  . Cancer Sister     breast    SOCIAL HISTORY: Social History   Social History  . Marital Status: Married    Spouse Name: N/A  . Number of Children: 2  . Years of Education: 16   Occupational History  . retired Pharmacist, hospital    Social History Main Topics  . Smoking status: Never Smoker   . Smokeless tobacco: Never Used  . Alcohol Use: No  . Drug Use: No  . Sexual Activity: Not on file   Other Topics Concern  . Not on file   Social History Narrative   Lives at home with husband.   Right- handed   Occasional caffeine use.     PHYSICAL EXAM  Filed Vitals:   10/27/15 1323  BP: 73/42  Pulse: 70  Height: 5\' 5"  (1.651 m)  Weight: 94 lb 9.6 oz (42.91 kg)   Body mass index is 15.74 kg/(m^2).   PHYSICAL EXAMNIATION:  Gen: NAD, conversant, well nourised, obese, well groomed                     Cardiovascular: Regular rate rhythm, no peripheral edema, warm, nontender. Eyes: Conjunctivae clear without exudates or hemorrhage Neck: Supple, no carotid bruise. Pulmonary: Clear to auscultation bilaterally   NEUROLOGICAL EXAM:  MENTAL STATUS: Speech:    Speech is normal; fluent and spontaneous with normal comprehension.  Cognition: MMSE 22/30, Animal naming 11.     Orientation: She is not oriented to date, day,    recent and remote memory: She missed 3/3 recalls     Attention span and concentration: She has difficulties spell world backwards     Normal Language, naming, repeating,spontaneous speech, she has difficulty copy design  Fund of knowledge   CRANIAL NERVES: CN II: Visual fields are full to confrontation. Fundoscopic exam is normal with sharp discs and no vascular changes. Pupils are round equal and briskly reactive to light. CN III, IV, VI:  extraocular movement are normal. No ptosis. CN V: Facial sensation is intact to pinprick in all 3 divisions bilaterally. Corneal responses are intact.  CN VII: Face is symmetric with normal eye closure and smile. CN VIII: Hearing is normal to rubbing fingers CN IX, X: Palate elevates symmetrically. Phonation is normal. CN XI: Head turning and shoulder shrug are intact CN XII: Tongue is midline with normal movements and no atrophy.  MOTOR: Mild left upper extremity fixation on rapid rotating movement, right more than left limb rigidity  REFLEXES: Reflexes are 2+ and symmetric at the biceps, triceps, knees, and ankles. Plantar responses are flexor.  SENSORY: Intact to light touch, pinprick, positional and vibratory sensation are intact in fingers and toes.  COORDINATION: Rapid alternating movements and fine finger movements are intact. There is no dysmetria on finger-to-nose and heel-knee-shin.    GAIT/STANCE:   Romberg is absent.     DIAGNOSTIC DATA (LABS, IMAGING, TESTING) - I reviewed patient records, labs, notes, testing and imaging myself where available.  Lab Results  Component Value Date   WBC 9.5 10/11/2015   HGB 11.1* 10/11/2015   HCT 34.0* 10/11/2015   MCV 91.7 10/11/2015   PLT 285 10/11/2015      Component Value Date/Time   NA 140 10/11/2015 0750   NA 144 06/20/2015 1534   K 3.7 10/11/2015 0750   CL 108 10/11/2015 0750   CO2 27 10/11/2015 0750   GLUCOSE 79 10/11/2015 0750   GLUCOSE 150* 06/20/2015 1534   BUN 24* 10/11/2015 0750   BUN 25 06/20/2015 1534   CREATININE 0.61 10/11/2015 0750   CREATININE 0.82 04/02/2014 1301   CALCIUM 8.9 10/11/2015 0750   PROT 6.9 10/11/2015 0750   PROT 6.8 02/18/2015 1116   ALBUMIN 3.6 10/11/2015 0750   ALBUMIN 4.1 02/18/2015 1116   AST 15 10/11/2015 0750   ALT 6* 10/11/2015 0750   ALKPHOS 162* 10/11/2015 0750   BILITOT 0.7 10/11/2015 0750   BILITOT 0.5 02/18/2015 1116   GFRNONAA >60 10/11/2015 0750   GFRAA >60  10/11/2015 0750   Lab Results  Component Value Date   CHOL 155 02/18/2015   HDL 70 02/18/2015   LDLCALC 74 02/18/2015   TRIG 54 02/18/2015   CHOLHDL 2.2 02/18/2015    Lab Results  Component Value Date   [id number] 09/17/2015   Lab Results  Component Value Date   TSH 2.851 09/17/2015   ASSESSMENT AND PLAN  81 y.o. year old female  Parkinson's disease  Keep sinemet 25/100 2 tabs three times a day  Vascular dementia with behavior issues  Increase zyprexa to 10mg  qhs/ 2.5-5mg  during the day  Taper off gabapentin, Cymbalta, Remeron to avoid the polypharmacy side effect  Right encephalomalacia, history of partial seizure  Keep lamotrigine xr 200mg  qday    Marcial Pacas, M.D. Ph.D.  [facility] [place], [place] [address] Phone: [phone] Fax:      [phone]

## 2015-10-27 NOTE — Patient Instructions (Signed)
Patient will benefit continue with rehabilitation physical therapy, close behavior monitoring, she is at high risk for fall,  1, stop gabapentin 200 mg in the morning, 100 mg at noon. 2, stop Cymbalta 30 mg every morning 3. Stop Remeron 7.5 milligrams every night 4. Increase Zyprexa   from 5 to 10 mg every night, add on Zyprexa 2.5 milligrams every morning  5. Keep lamotrigine xr 200 mg every night  6. She is at high risk for fall, please monitor her closely, continue physical therapy,  7. Continue Sinemet 25/100 mg 2 tablets at 6:30 AM, 11:30, 1630 p.m.  8. Return to neurology follow-up in 3-4 weeks.

## 2015-10-28 LAB — COMPREHENSIVE METABOLIC PANEL
ALBUMIN: 3.9 g/dL (ref 3.5–5.0)
ALT: 8 U/L — ABNORMAL LOW (ref 14–54)
ANION GAP: 8 (ref 5–15)
AST: 24 U/L (ref 15–41)
Alkaline Phosphatase: 122 U/L (ref 38–126)
BUN: 26 mg/dL — ABNORMAL HIGH (ref 6–20)
CALCIUM: 9 mg/dL (ref 8.9–10.3)
CHLORIDE: 108 mmol/L (ref 101–111)
CO2: 24 mmol/L (ref 22–32)
Creatinine, Ser: 1.08 mg/dL — ABNORMAL HIGH (ref 0.44–1.00)
GFR calc non Af Amer: 48 mL/min — ABNORMAL LOW (ref >60)
GFR, EST AFRICAN AMERICAN: 55 mL/min — AB (ref >60)
Glucose, Bld: 171 mg/dL — ABNORMAL HIGH (ref 65–99)
Potassium: 4 mmol/L (ref 3.5–5.1)
SODIUM: 140 mmol/L (ref 135–145)
Total Bilirubin: 0.3 mg/dL (ref 0.3–1.2)
Total Protein: 6.6 g/dL (ref 6.5–8.1)

## 2015-10-28 LAB — CBC WITH DIFFERENTIAL/PLATELET
BASOS PCT: 1 %
Basophils Absolute: 0 10*3/uL (ref 0–0.1)
EOS ABS: 0.2 10*3/uL (ref 0–0.7)
EOS PCT: 4 %
HCT: 33.9 % — ABNORMAL LOW (ref 35.0–47.0)
Hemoglobin: 11.1 g/dL — ABNORMAL LOW (ref 12.0–16.0)
LYMPHS ABS: 1.2 10*3/uL (ref 1.0–3.6)
Lymphocytes Relative: 29 %
MCH: 29.6 pg (ref 26.0–34.0)
MCHC: 32.6 g/dL (ref 32.0–36.0)
MCV: 90.9 fL (ref 80.0–100.0)
Monocytes Absolute: 0.2 10*3/uL (ref 0.2–0.9)
Monocytes Relative: 6 %
Neutro Abs: 2.6 10*3/uL (ref 1.4–6.5)
Neutrophils Relative %: 60 %
PLATELETS: 230 10*3/uL (ref 150–440)
RBC: 3.73 MIL/uL — AB (ref 3.80–5.20)
RDW: 13.8 % (ref 11.5–14.5)
WBC: 4.2 10*3/uL (ref 3.6–11.0)

## 2015-10-31 ENCOUNTER — Emergency Department: Payer: Medicare Other

## 2015-10-31 ENCOUNTER — Encounter: Payer: Self-pay | Admitting: Emergency Medicine

## 2015-10-31 ENCOUNTER — Ambulatory Visit (HOSPITAL_COMMUNITY): Payer: Medicare Other | Admitting: Physical Therapy

## 2015-10-31 ENCOUNTER — Encounter (HOSPITAL_COMMUNITY): Payer: Medicare Other

## 2015-10-31 ENCOUNTER — Emergency Department
Admission: EM | Admit: 2015-10-31 | Discharge: 2015-10-31 | Disposition: A | Discharge status: Home / Self Care | Payer: Medicare Other | Attending: Emergency Medicine | Admitting: Emergency Medicine

## 2015-10-31 DIAGNOSIS — Z79899 Other long term (current) drug therapy: Secondary | ICD-10-CM | POA: Diagnosis not present

## 2015-10-31 DIAGNOSIS — Z7982 Long term (current) use of aspirin: Secondary | ICD-10-CM | POA: Insufficient documentation

## 2015-10-31 DIAGNOSIS — M810 Age-related osteoporosis without current pathological fracture: Secondary | ICD-10-CM | POA: Diagnosis not present

## 2015-10-31 DIAGNOSIS — Z955 Presence of coronary angioplasty implant and graft: Secondary | ICD-10-CM | POA: Diagnosis not present

## 2015-10-31 DIAGNOSIS — Z8669 Personal history of other diseases of the nervous system and sense organs: Secondary | ICD-10-CM | POA: Diagnosis not present

## 2015-10-31 DIAGNOSIS — G20 Parkinson's disease: Secondary | ICD-10-CM | POA: Insufficient documentation

## 2015-10-31 DIAGNOSIS — T43595A Adverse effect of other antipsychotics and neuroleptics, initial encounter: Secondary | ICD-10-CM | POA: Insufficient documentation

## 2015-10-31 DIAGNOSIS — E785 Hyperlipidemia, unspecified: Secondary | ICD-10-CM | POA: Insufficient documentation

## 2015-10-31 DIAGNOSIS — I2510 Atherosclerotic heart disease of native coronary artery without angina pectoris: Secondary | ICD-10-CM | POA: Insufficient documentation

## 2015-10-31 DIAGNOSIS — Z8673 Personal history of transient ischemic attack (TIA), and cerebral infarction without residual deficits: Secondary | ICD-10-CM | POA: Diagnosis not present

## 2015-10-31 DIAGNOSIS — M1990 Unspecified osteoarthritis, unspecified site: Secondary | ICD-10-CM | POA: Diagnosis not present

## 2015-10-31 DIAGNOSIS — R451 Restlessness and agitation: Secondary | ICD-10-CM | POA: Insufficient documentation

## 2015-10-31 DIAGNOSIS — T50905A Adverse effect of unspecified drugs, medicaments and biological substances, initial encounter: Secondary | ICD-10-CM

## 2015-10-31 LAB — CBC
HEMATOCRIT: 34.6 % — AB (ref 35.0–47.0)
Hemoglobin: 11 g/dL — ABNORMAL LOW (ref 12.0–16.0)
MCH: 29.4 pg (ref 26.0–34.0)
MCHC: 31.8 g/dL — ABNORMAL LOW (ref 32.0–36.0)
MCV: 92.4 fL (ref 80.0–100.0)
Platelets: 209 10*3/uL (ref 150–440)
RBC: 3.74 MIL/uL — ABNORMAL LOW (ref 3.80–5.20)
RDW: 13.6 % (ref 11.5–14.5)
WBC: 5.3 10*3/uL (ref 3.6–11.0)

## 2015-10-31 LAB — COMPREHENSIVE METABOLIC PANEL
ALBUMIN: 4.2 g/dL (ref 3.5–5.0)
ALT: <5 U/L — ABNORMAL LOW (ref 14–54)
AST: 22 U/L (ref 15–41)
Alkaline Phosphatase: 137 U/L — ABNORMAL HIGH (ref 38–126)
Anion gap: 8 (ref 5–15)
BUN: 22 mg/dL — AB (ref 6–20)
CHLORIDE: 105 mmol/L (ref 101–111)
CO2: 26 mmol/L (ref 22–32)
Calcium: 9.5 mg/dL (ref 8.9–10.3)
Creatinine, Ser: 0.97 mg/dL (ref 0.44–1.00)
GFR calc Af Amer: >60 mL/min (ref >60)
GFR, EST NON AFRICAN AMERICAN: 55 mL/min — AB (ref >60)
GLUCOSE: 102 mg/dL — AB (ref 65–99)
POTASSIUM: 4.1 mmol/L (ref 3.5–5.1)
Sodium: 139 mmol/L (ref 135–145)
Total Bilirubin: 0.6 mg/dL (ref 0.3–1.2)
Total Protein: 7.4 g/dL (ref 6.5–8.1)

## 2015-10-31 MED ORDER — LORAZEPAM 1 MG PO TABS
1.0000 mg | ORAL_TABLET | Freq: Once | ORAL | Status: AC
  Administered 2015-10-31: 1 mg via ORAL
  Filled 2015-10-31: qty 1

## 2015-10-31 MED ORDER — LORAZEPAM 1 MG PO TABS: 1.0000 mg | ORAL_TABLET | Freq: Three times a day (TID) | ORAL | Status: DC | PRN

## 2015-10-31 NOTE — ED Notes (Signed)
Ems called for transport back to [facility].

## 2015-10-31 NOTE — ED Notes (Signed)
Pt here from [facility], facility reports pt has become combative and aggitated x1 week; reports they changed her medications last week.

## 2015-10-31 NOTE — Discharge Instructions (Signed)
Drug Toxicity °Drug toxicity refers to harmful and unwanted (adverse) effects of a drug in your body. Drug toxicity often results from taking too much of a drug (overdose) by accident or on purpose. With some drugs, there is only a small difference between the dose that is needed to treat your condition and a dose that is harmful (narrow therapeutic range). However, any drug can be toxic at high doses, and even normal doses of certain drugs can be toxic for some people. These include over-the-counter (OTC) medicines. °Drug toxicity can happen suddenly when you first start taking a drug or when you suddenly take too much of a drug (acute toxicity). It can also happen as a result of taking a drug for a long period of time (chronic toxicity). The effects of drug toxicity can be mild, dangerous, or even deadly. °CAUSES °Many things can cause drug toxicity. Common causes of acute toxicity include a drug overdose or an allergic reaction to a drug. °Most drugs are broken down (metabolized) by your liver and eliminated (excreted) by your kidneys. Chronic drug toxicity can result from changes in the way that your body metabolizes a drug. This can happen, for example, if you weigh less than you did when you started taking a drug but you keep taking the same dose that you took at the heavier weight. °RISK FACTORS °You may have a higher risk for drug toxicity if you: °· Are under 18 years of age or over 65 years of age. °· Have liver disease, kidney disease, or another medical condition. °· Are taking more than one drug. °· Are pregnant. °· Are allergic to certain drugs. °· Have genes that cause you to be more affected by (susceptible to) certain drugs. °· Take a drug that has a narrow therapeutic range. °Certain types of drugs are more likely than others to cause toxicity. Many drugs have a narrow therapeutic range, including: °· Blood thinners. °· Heart medicines. °· Diabetes medicines. °· Medicines to prevent or stop  seizures. °· Theophylline for asthma. °· Lithium for bipolar disorder. °SYMPTOMS °Signs and symptoms of drug toxicity depend on the drug and the amount that was taken. They may start suddenly or develop gradually over time. °DIAGNOSIS °Drug toxicity may be diagnosed based on your symptoms. Some drugs have known side effects that suggest toxicity. It is important that you tell health care provider about all of the drugs that you are taking and whether you have ever had a reaction to a drug. °Your health care provider will do a physical exam. You may have tests to check for drug toxicity, including: °· Blood tests to measure the amount of the drug in your blood or to check for signs of kidney or liver damage. °· Urine tests. °· Other tests to check for organ damage. °TREATMENT °Treatment may include: °· Stopping the drug. °· Lowering the dose of the drug. °· Switching to a different drug. °You may also need treatment to stop or reverse the effects of the toxicity. These treatments depend on the drug that caused the toxicity, how severe the toxicity is, and which parts of your body are affected. °HOME CARE INSTRUCTIONS °· Take medicines only as directed by your health care provider. Always ask your health care provider to discuss the possible side effects of any new drug that you start taking. °· Keep a list of all of the drugs that you take, including over-the-counter medicines. Bring this list with you to all of your medical visits. °· Read   the drug inserts that come with your medicines.  Keep all follow-up visits as directed by your health care provider. This is important. SEEK MEDICAL CARE IF:  Your symptoms return.  You develop any new signs or symptoms when you are taking medicines.  You notice any signs that indicate that you are taking too much of your medicine, based on what your health care provider told you to watch for. SEEK IMMEDIATE MEDICAL CARE IF:  You have chest pain.  You have difficulty  breathing.  You have a loss of consciousness.   This information is not intended to replace advice given to you by your health care provider. Make sure you discuss any questions you have with your health care provider.   Document Released: 05/07/2005 Document Revised: 09/21/2014 Document Reviewed: 05/12/2014 Elsevier Interactive Patient Education [employer].  Please return immediately if condition worsens. Please contact her primary physician or the physician you were given for referral. If you have any specialist physicians involved in her treatment and plan please also contact them. Thank you for using [facility]. Head CT shows no significant abnormalities at this time and the patient's agitation is likely either a side effect of the changes in her medication and/or some developing dementia. Patient at this time does not require admission to the hospital but may need placement in the future for higher level of care

## 2015-10-31 NOTE — ED Notes (Signed)
I called [facility] [phone] about transporting pt back. I was told by unit that they were told by director of nursing that pt was not coming back. I asked to speak to that person. Called [phone] and left message on voice mail that pt was ready to return and to please call me. Called back to unit at [phone] and told them that we could not hold pt in ED and I needed to talk to director. Was told she would call me. Communicated same to pts husband.

## 2015-10-31 NOTE — ED Notes (Signed)
pts husband requests that we cancel EMS transport and that he would take pt home if we would put pt in wheelchair and help him get her to car. Pt was helped to car and assisted into car. Discharge paperwork given to pts husband.

## 2015-10-31 NOTE — ED Provider Notes (Signed)
Time Seen: Approximately 12:00 I have reviewed the triage notes  Chief Complaint: Altered Mental Status   History of Present Illness: Samantha Hudson is a 81 y.o. female *who presents from [facility]. Patient is sent from their rehabilitation area for evaluation of agitation. Patient has a known history of previous fibromyalgia and was on Cymbalta for several years. She's also had a previous history of a stroke along with Parkinson's disease. Patient apparently was very hard to keep in the room and seems very agitated. Patient to have was stopped by the husband who received a Xanax prior to arrival and she is much more calm here in the emergency department. She has had admission there to [facility] and after a non-syncopal fall and has had a head CT and had known broken ribs. Then a fever, nausea, vomiting. She apparently is been transitioned from Cymbalta to Zyprexa. Recent increase in her dosage of her Zyprexa. Patient herself is oriented times one and cooperative but unable to give reliable history or review of systems.   Past Medical History  Diagnosis Date  . Hyperlipemia   . IBS (irritable bowel syndrome)   . Osteoporosis   . Fibromyalgia   . Glaucoma     HAD LASER SURGERY - NO EYE DROPS REQUIRED  . Globus hystericus   . Fibromyalgia   . Meningioma ([place])   . CAD (coronary artery disease)     HEART STENT PLACED ABOUT 2000- NO LONGER SEES CARDIOLOGIST; NO C/O OF CHEST PAINS OR SOB  . Heart murmur     MVP SINCE THE 60'S - TAKES ATENOLOL -  . Anxiety   . Swelling     BILATERAL FEET AND LEGS - ONSET OF SWELLING APRIL 2014  . GERD (gastroesophageal reflux disease)   . Arthritis   . Fracture   . CVA (cerebral vascular accident) ([place])     STROKE [place] TO REMOVE BENIGN MENINGIOMA - HAS LEFT SIDED WEAKNESS AND A LITTLE NUMBNESS  . Pain     "EXCRUCIATING PAIN" LEFT HIP - HAS A FRACTURE - IS CONFINED TO W/C AT HOME - NO WEIGHT BEARING LEFT HIP.  Marland Kitchen  Parkinson's disease [facility])     Patient Active Problem List   Diagnosis Date Noted  . Fracture of left clavicle 09/26/2015  . Fracture of multiple ribs of left side 09/26/2015  . Facial laceration 09/26/2015  . TBI (traumatic brain injury) ([place]) 09/26/2015  . Fall 09/25/2015  . Confusion   . Acute encephalopathy 09/17/2015  . Parkinson's disease ([place]) 09/17/2015  . Acute kidney injury ([place]) 09/17/2015  . Altered mental status 09/16/2015  . Anxiety 09/16/2015  . Paranoia ([place]) 09/16/2015  . CAD (coronary artery disease) 08/14/2015  . Chest pain 08/14/2015  . Constipation 08/08/2015  . Seizures ([place]) 06/14/2015  . Degenerative arthritis of thumb 03/03/2015  . Abnormality of gait 08/12/2014  . Parkinsonism ([place]) 08/12/2014  . Stroke ([place]) 08/12/2014  . Muscle weakness (generalized) 02/22/2014  . Stiffness of left knee 02/22/2014  . Hamstring tightness of left lower extremity 02/22/2014  . Pain in joint, pelvic region and thigh 02/22/2014  . Difficulty walking 04/07/2013  . Osteoporosis 03/10/2013  . Anemia 03/10/2013  . Mild malnutrition ([place]) 03/10/2013  . S/P left hip hemiarthroplasty 12/30/2012  . Osteoporosis with pathological fracture with delayed healing 11/25/2012  . Pedal edema 10/27/2012  . Fracture of sacrum ([place]) 10/02/2012  . Stricture and stenosis of esophagus 09/24/2011  . Hyperlipidemia 11/03/2008  .  Coronary atherosclerosis 11/03/2008    Past Surgical History  Procedure Laterality Date  . Brain surgery      tumor removed  . Tonsillectomy    . Partial hysterectomy    . Glaucoma repair    . Cataract extraction    . Kidney surgery      left - HX ENLARGED LEFT KIDNEY ON XRAY - SURGERY WAS DONE ON URETER   . Colonoscopy    . Coronary angioplasty    . Hip arthroplasty Left 12/29/2012    Procedure: LEFT HIP HEMI ARTHROPLASTY ;  Surgeon: Mauri Pole, MD;  Location: [facility];  Service: Orthopedics;  Laterality: Left;    Past Surgical History  Procedure  Laterality Date  . Brain surgery      tumor removed  . Tonsillectomy    . Partial hysterectomy    . Glaucoma repair    . Cataract extraction    . Kidney surgery      left - HX ENLARGED LEFT KIDNEY ON XRAY - SURGERY WAS DONE ON URETER   . Colonoscopy    . Coronary angioplasty    . Hip arthroplasty Left 12/29/2012    Procedure: LEFT HIP HEMI ARTHROPLASTY ;  Surgeon: Mauri Pole, MD;  Location: [facility];  Service: Orthopedics;  Laterality: Left;    Current Outpatient Rx  Name  Route  Sig  Dispense  Refill  . acetaminophen (TYLENOL) 325 MG tablet   Oral   Take 650 mg by mouth every 4 (four) hours as needed for mild pain, moderate pain or fever.          . ALPRAZolam (XANAX) 0.25 MG tablet   Oral   Take 1 tablet (0.25 mg total) by mouth at bedtime as needed for anxiety. Patient taking differently: Take 0.25 mg by mouth daily.    30 tablet   5   . ALPRAZolam (XANAX) 0.25 MG tablet   Oral   Take 0.125-0.25 mg by mouth every 6 (six) hours as needed for anxiety.         Marland Kitchen aspirin EC 81 MG tablet   Oral   Take 81 mg by mouth daily.         Marland Kitchen atenolol (TENORMIN) 25 MG tablet   Oral   Take 12.5 mg by mouth 2 (two) times daily.         Marland Kitchen atorvastatin (LIPITOR) 10 MG tablet   Oral   Take 10 mg by mouth daily.         . Calcium-Vitamin D-Vitamin K 500-200-40 MG-UNT-MCG CHEW   Oral   Chew 1 Dose by mouth 2 (two) times daily.         . carbidopa-levodopa (SINEMET IR) 25-100 MG tablet   Oral   Take 2 tablets by mouth 3 (three) times daily.         . Cholecalciferol (HM VITAMIN D3) 4000 units CAPS   Oral   Take 4,000 Units by mouth daily.         Marland Kitchen gabapentin (NEURONTIN) 400 MG capsule   Oral   Take 400 mg by mouth at bedtime.         . hydroxypropyl methylcellulose / hypromellose (ISOPTO TEARS / GONIOVISC) 2.5 % ophthalmic solution   Both Eyes   Place 1 drop into both eyes as needed for dry eyes. Reported on 06/20/2015         . LamoTRIgine XR 200 MG  TB24   Oral   Take 1 tablet (  200 mg total) by mouth at bedtime.   90 tablet   4   . LORazepam (ATIVAN) 2 MG/ML injection   Intramuscular   Inject 1 mg into the muscle as needed (for psychosis/rapid tranquilization/combativeness).         . OLANZapine (ZYPREXA) 2.5 MG tablet   Oral   Take 2.5 mg by mouth every morning.         Marland Kitchen OLANZapine (ZYPREXA) 5 MG tablet   Oral   Take 5 mg by mouth at bedtime.         . polyethylene glycol powder (GLYCOLAX/MIRALAX) powder   Oral   Take 17 g by mouth daily as needed for mild constipation or moderate constipation.         . potassium chloride (K-DUR) 10 MEQ tablet   Oral   Take 10 mEq by mouth 2 (two) times daily.         . ranitidine (ZANTAC) 300 MG tablet   Oral   Take 300 mg by mouth daily.         . Skin Protectants, Misc. (ENDIT EX)   Apply externally   Apply topically as needed (for irritation).         . torsemide (DEMADEX) 20 MG tablet   Oral   Take 10 mg by mouth daily as needed (for fluid).         . traMADol (ULTRAM) 50 MG tablet   Oral   Take 1-2 tablets (50-100 mg total) by mouth every 6 (six) hours as needed (Pain).   24 tablet   0     Allergies:  Lactose intolerance (gi) and Penicillins  Family History: Family History  Problem Relation Age of Onset  . Liver disease Father   . Diabetes Father   . Coronary artery disease Mother     deseased  . Heart attack Mother   . Hyperlipidemia Mother   . Breast cancer Sister     x 2  . Cancer Sister     breast    Social History: Social History  Substance Use Topics  . Smoking status: Never Smoker   . Smokeless tobacco: Never Used  . Alcohol Use: No     Review of Systems:   10 point review of systems was performed and was otherwise negative: Review of systems obtained through the patient, her husband, medical record, EMS report Constitutional: No fever Eyes: No visual disturbances ENT: No sore throat, ear pain Cardiac: No chest  pain Respiratory: No shortness of breath, wheezing, or stridor Abdomen: No abdominal pain, no vomiting, No diarrhea Endocrine: No weight loss, No night sweats Extremities: No peripheral edema, cyanosis Skin: No rashes, easy bruising Neurologic: No focal weakness, trouble with speech or swollowing Urologic: No dysuria, Hematuria, or urinary frequency   Physical Exam:  ED Triage Vitals  Enc Vitals Group     BP 10/31/15 1121 129/71 mmHg     Pulse Rate 10/31/15 1121 82     Resp 10/31/15 1121 18     Temp 10/31/15 1121 97.8 F (36.6 C)     Temp Source 10/31/15 1121 Oral     SpO2 10/31/15 1121 100 %     Weight 10/31/15 1121 88 lb (39.917 kg)     Height 10/31/15 1121 5\' 6"  (1.676 m)     Head Cir --      Peak Flow --      Pain Score --      Pain Loc --  Pain Edu? --      Excl. in [place]? --     General: Awake , Alert , and Oriented times 1. Cooperative, Glasgow Coma Scale 15  Head: Normal cephalic , atraumatic Eyes: Pupils equal , round, reactive to light Nose/Throat: No nasal drainage, patent upper airway without erythema or exudate.  Neck: Supple, Full range of motion, No anterior adenopathy or palpable thyroid masses Lungs: Clear to ascultation without wheezes , rhonchi, or rales Heart: Regular rate, regular rhythm without murmurs , gallops , or rubs Abdomen: Soft, non tender without rebound, guarding , or rigidity; bowel sounds positive and symmetric in all 4 quadrants. No organomegaly .        Extremities: Limited range of motion for both lower extremities with no obvious edema, clubbing or cyanosis Neurologic: normal ambulation prior to arrival, no symmetric without deficits, sensory intact Skin: warm, dry, no rashes   Labs:   All laboratory work was reviewed including any pertinent negatives or positives listed below:  Labs Reviewed  CBC - Abnormal; Notable for the following:    RBC 3.74 (*)    Hemoglobin 11.0 (*)    HCT 34.6 (*)    MCHC 31.8 (*)    All other  components within normal limits  COMPREHENSIVE METABOLIC PANEL    Radiology: *     CT Head Wo Contrast (Final result) Result time: 10/31/15 12:50:09   Final result by Rad Results In Interface (10/31/15 12:50:09)   Narrative:   CLINICAL DATA: 81 year old female with altered mental status  EXAM: CT HEAD WITHOUT CONTRAST  TECHNIQUE: Contiguous axial images were obtained from the base of the skull through the vertex without intravenous contrast.  COMPARISON: CT dated 09/26/2015  FINDINGS: There is a large area of old infarct and encephalomalacia involving the right MCA territory compatible with an old infarct. There is mild prominence of the ventricles and sulci compatible with age-related atrophy. Mild periventricular and deep white matter chronic microvascular ischemic changes noted. There is no acute intracranial hemorrhage. This no mass effect or midline shift noted. Right temporal craniotomy changes. The visualized paranasal sinuses and mastoid air cells are clear.  IMPRESSION: No acute intracranial hemorrhage.  Large right MCA territory old infarct and encephalomalacia.   Electronically Signed By: Anner Crete M.D.      I personally reviewed the radiologic studies    ED Course:  The patient was cooperative during her stay here in the emergency department. I do see where she has some underlying agitation which may be from side effects of changing her medication and/or some developing dementia. The patient's medical evaluation appears to be stable at this time and there's no new findings per CAT scan evaluation. Patient started becoming more agitated toward the end of her stay and was given Ativan and she seems to respond well to benzodiazepine therapy. Patient was prescribed Ativan on an outpatient basis which I felt would give her more extended behavioral control from her agitation. The patient is likely to require a higher level of care but I felt that  that did not need to be arranged through the hospital at this time and the patient was transported back to the rehabilitation facility.   Assessment:  Altered mental status Dementia Parkinson's disease      Plan: Outpatient management Patient was taken back to the nursing facility after some resistance per the nursing note by BLS unit            Daymon Larsen, MD 10/31/15 [phone]

## 2015-11-01 ENCOUNTER — Ambulatory Visit: Payer: Medicare Other | Admitting: Neurology

## 2015-11-01 ENCOUNTER — Telehealth: Payer: Self-pay | Admitting: Neurology

## 2015-11-01 NOTE — Telephone Encounter (Signed)
Christin Gusler/NP with Palliative Care [phone] (c) called reg medication and plans for care

## 2015-11-02 ENCOUNTER — Other Ambulatory Visit
Admission: RE | Admit: 2015-11-02 | Discharge: 2015-11-02 | Disposition: A | Discharge status: Home / Self Care | Payer: Medicare Other | Source: Ambulatory Visit | Attending: Gerontology | Admitting: Gerontology

## 2015-11-02 DIAGNOSIS — S065X9D Traumatic subdural hemorrhage with loss of consciousness of unspecified duration, subsequent encounter: Secondary | ICD-10-CM | POA: Insufficient documentation

## 2015-11-02 LAB — COMPREHENSIVE METABOLIC PANEL
ALBUMIN: 4.1 g/dL (ref 3.5–5.0)
ALK PHOS: 126 U/L (ref 38–126)
ALT: 6 U/L — ABNORMAL LOW (ref 14–54)
AST: 20 U/L (ref 15–41)
Anion gap: 9 (ref 5–15)
BILIRUBIN TOTAL: 0.6 mg/dL (ref 0.3–1.2)
BUN: 25 mg/dL — AB (ref 6–20)
CALCIUM: 9.6 mg/dL (ref 8.9–10.3)
CO2: 27 mmol/L (ref 22–32)
CREATININE: 0.91 mg/dL (ref 0.44–1.00)
Chloride: 104 mmol/L (ref 101–111)
GFR calc Af Amer: >60 mL/min (ref >60)
GFR calc non Af Amer: 59 mL/min — ABNORMAL LOW (ref >60)
GLUCOSE: 80 mg/dL (ref 65–99)
Potassium: 4.1 mmol/L (ref 3.5–5.1)
Sodium: 140 mmol/L (ref 135–145)
TOTAL PROTEIN: 7.5 g/dL (ref 6.5–8.1)

## 2015-11-02 LAB — CBC WITH DIFFERENTIAL/PLATELET
BASOS ABS: 0 10*3/uL (ref 0–0.1)
BASOS PCT: 1 %
Eosinophils Absolute: 0.1 10*3/uL (ref 0–0.7)
Eosinophils Relative: 2 %
HEMATOCRIT: 35.1 % (ref 35.0–47.0)
HEMOGLOBIN: 11.4 g/dL — AB (ref 12.0–16.0)
LYMPHS PCT: 26 %
Lymphs Abs: 1.1 10*3/uL (ref 1.0–3.6)
MCH: 29.8 pg (ref 26.0–34.0)
MCHC: 32.5 g/dL (ref 32.0–36.0)
MCV: 91.6 fL (ref 80.0–100.0)
MONOS PCT: 9 %
Monocytes Absolute: 0.4 10*3/uL (ref 0.2–0.9)
NEUTROS ABS: 2.5 10*3/uL (ref 1.4–6.5)
NEUTROS PCT: 62 %
Platelets: 205 10*3/uL (ref 150–440)
RBC: 3.83 MIL/uL (ref 3.80–5.20)
RDW: 13.5 % (ref 11.5–14.5)
WBC: 4.1 10*3/uL (ref 3.6–11.0)

## 2015-11-03 ENCOUNTER — Telehealth: Payer: Self-pay | Admitting: Family Medicine

## 2015-11-03 NOTE — Telephone Encounter (Addendum)
I had a long discussion with Christin Gusler with palliative care, updated her on [name] medical condition, and the medication changes in her most recent visit in October 27 2015  Samantha Hudson continue have intermittent confusion, combative behavior, higher dose of Zyprexa 2.5 mg/10 mg has caused low blood pressure 70s. She will be discussed from current rehabilitation facility in next week, she needed a temporary more intense supervised environment before she can be discharged home in a stable medication regime

## 2015-11-03 NOTE — Telephone Encounter (Signed)
Dr Richardson Landry, Dr Scott's Pt is needing Hospice orders signed  They have been put in your yellow folder

## 2015-11-08 NOTE — Telephone Encounter (Signed)
Facility called to check on status of this form  Will this be done today?

## 2015-11-09 ENCOUNTER — Ambulatory Visit: Payer: Medicare Other

## 2015-11-10 NOTE — Telephone Encounter (Signed)
Faxed today by nurses

## 2015-11-10 NOTE — Telephone Encounter (Signed)
I discussed the case again with her husband. He confirmed that they did in fact 1 hospice consultation in that they would prefer palliative care.

## 2015-11-13 ENCOUNTER — Encounter: Payer: Self-pay | Admitting: Family Medicine

## 2015-11-16 ENCOUNTER — Encounter: Payer: Self-pay | Admitting: Neurology

## 2015-11-16 ENCOUNTER — Encounter: Payer: Self-pay | Admitting: *Deleted

## 2015-11-16 ENCOUNTER — Ambulatory Visit (INDEPENDENT_AMBULATORY_CARE_PROVIDER_SITE_OTHER): Admitting: Neurology

## 2015-11-16 VITALS — BP 109/59 | HR 96

## 2015-11-16 DIAGNOSIS — S42002S Fracture of unspecified part of left clavicle, sequela: Secondary | ICD-10-CM | POA: Diagnosis not present

## 2015-11-16 DIAGNOSIS — R410 Disorientation, unspecified: Secondary | ICD-10-CM | POA: Diagnosis not present

## 2015-11-16 DIAGNOSIS — G20 Parkinson's disease: Secondary | ICD-10-CM | POA: Diagnosis not present

## 2015-11-16 DIAGNOSIS — I63511 Cerebral infarction due to unspecified occlusion or stenosis of right middle cerebral artery: Secondary | ICD-10-CM | POA: Diagnosis not present

## 2015-11-16 DIAGNOSIS — S2242XS Multiple fractures of ribs, left side, sequela: Secondary | ICD-10-CM | POA: Diagnosis not present

## 2015-11-16 MED ORDER — LAMOTRIGINE 100 MG PO TBDP: 100.0000 mg | ORAL_TABLET | Freq: Two times a day (BID) | ORAL | Status: DC

## 2015-11-16 NOTE — Progress Notes (Signed)
Chief Complaint  Patient presents with  . Parkinsons/Memory Loss    MMSE 18/28 (unable to fold paper due to broken arm) - 9 animals. She is here with her husband, Samantha Hudson and her daughter, Samantha Hudson.  She is now at [facility] and her behavior has improved since being at the new facility.  She got out of bed on 11/11/15, unassisted, and broke her right arm.  Her memory is still a big concern for them.     [facility]  PATIENT: Samantha Hudson DOB: December 06, 1934   REASON FOR VISIT: Follow-up for Parkinson's disease, abnormality of gait, delirium HISTORY FROM: Husband and daughters    HISTORY OF PRESENT ILLNESS: HISTORY Samantha Hudson is a 81 years old right-handed Caucasian female, accompanied by her husband, a retired Software engineer, referred by her primary care physician Dr. Sallee Lange for evaluation of worsening gait difficulty, difficulty initiate gait She had a history of right meningioma, status post resection in 2007, 2 days later, she suffered a large right MCA stroke, also with past medical history of coronary artery disease, status post stent placement, she recovered very well, was able to drive, ambulate without assistance, but with residual left hemiparesthesia, often unpleasant deep achy pain involving left side of her body, She had osteoporosis, left hip fracture in 2014, require replacement in August 2014, since surgery, she had a great decline of her ambulatory ability, she now rely on her walker In 2014, she also developed loss sense of smell, REM sleep disorder, screaming out of her dreams, mild constipation, now she noticed right hand tremor, small handwriting since 2015, mild memory trouble, worsening gait difficulty, difficulty initiate walking using her right leg, tendency to lean backwards, worsening left-sided pain, especially around her left hip, left anterior thigh. She is taking gabapentin, which has been helpful, but 400 mg 4 times a day,  will make her sleepy, she is only taking 900 mg daily now, continue have excessive fatigue, daytime sleepiness, difficult to read, double vision.  We have reviewed MRI of the brain August 04 2014, demonstrate large size right MCA stroke, involving right medial, and lateral temporal lobe, no acute lesions.  April 20 first 2016 Since her initial visit August 12 2014, because of mild parkinsonian features, I have started Sinemet 25/100 one tablet 3 times a day, she is taking it at 10, 3, and 9 PM, no significant improvement, no significant side effect, she complains mild low back pain, continue significant gait difficulty, difficulty picking up her right leg  UPDATE November 11 2014: She can move better after Sinemet dosage was increased to 25/100 mg 4 times a day, physical therapy was helpful, husband reported that she takes Sinemet 1 hour before any meal, become center of her daily activity, She denies significant memory trouble, complains of chronic neck pain, bilateral shoulder pain, left hip pain, low back pain, We have reviewed her MRI lumbar in May 2016, There is lumbosacral transitional anatomy. This report assumes that there are 5 lumbar type vertebral bodies. Recommend close correlation with radiographs if intervention is elected. Moderate multilevel lumbar spondylosis. Degenerative disease is most pronounced at L3-L4 potentially affecting both exiting L3 nerves. Healed sacral insufficiency fractures demonstrated on prior nuclear medicine bone scan.  UPDATE Mar 14 2015: Around Sep 2016, she woke up one night she has bite her left lateral tongue, maybe with urinary incontinence, she is at risk for seizure, we have personally reviewed MRI of the brain with and without contrast in March 2016,  large right MCA encephalomalacia involving right temporal parietal frontal lobe.She is taking Sinemet 25/100 ii tid, at 7, 12, 5 PM, which does help her parkinsonian features, she has occasionally upset stomach,  husband is concerned about potential interaction of Azilect with tramadol, which she is taking as needed for low back pain  UPDATE Jun 14 2015:She has mild difficult with swallowing, only limited to take medication tablet, has no difficulty swallowing her food, Since Keppra XL our 750 mg was started in October 2016, she had one more episodes of woke up has bite her tongue in December 2016, husband also noticed she has increased agitations, continue has mild unsteady gait, left-sided low back pain, hip pain  UPDATE March 29th 2017: She only tried lamotrigine ER 50 mg every night in January, decided not to take it, worried about the side effects, she now taking Sinemet 25/100 mg 2 tablets 3 times a day, tolerating it well, ambulate much better, she can walk 1 mile now She has no recurrent seizure, she did presented to emergency room in August 13 2015 for complaints of shortness of breath, chest pain, troponin was negative, Echocardiogram showed ejection fraction 60%, laboratory showed normal BMP, mild anemia, hemoglobin of 10 point 5.Sinemet and physical therapy has helped her walking UPDATE  09/19/2015 CMMs. [name] 81 year old female returns for follow-up. She had a hospital admission to [facility] over the weekend for 2 days with diagnosis of acute encephalopathy and paranoia  And acute  kidney injury. She was disoriented. Her lamotrigine was changed about a month ago and her Keppra was slowly titrated. Her husband reports one episode of biting her time in the last month. CT of the head the hospital revealed no acute intracranial abnormalities but extensive encephalomalacia. UTI was ruled out. She has a canvas bag with her with her bill fold and a nightgown and she will not let the family look at this.She does not want the husband to leave her sight. She is refusing to take medications. She also has a history of Parkinson's disease and is on carbidopa levodopa. She has a history of stroke is also on Aggrenox  twice daily . She has a 24 7 caregiver .The family called in this morning wanted her to be seen on an urgent basis. Update June eighth 2017:  She was last seen by nurse practitioner Hoyle Sauer in June first 2017 for worsening paranoid, agitation, difficulty sleeping we started low-dose seroquel along with Nuplazid Unfortunately, she fell at May 5th, 6th and 7th, was taken to emergency room, found she has broken multiple ribs, sternum. She is now discharged to rehab, was noted to have increased confusion, agitation, paranoid, she was off Nuplazid since June 5th, was started on Zyprexa 5mg  qhs, with no clear improvement, also was given xanax prn. Her behavior issue is so dominant in current situation, there was a discussion of possible memory units placement versus hospice care,  I personally reviewed CAT scan of the brain without contrast in May eighth 2017: Unchanged extra-axial blood overlying the left cerebral hemisphere to a depth of 3-4 mm. Large right side encephalomalacia, evidence of right temporoparietal craniotomy.  Update November 16 2015: She is now at nursing facility memory unit, also initiate hospice care, unfortunately she fell broke her left anatomic neck of humor in November 12 2015, is in shoulder pain, no longer move about, she is now taking Zyprexa 5 mg twice a day, has been tapered off Cymbalta, mild swallowing difficulty, weak cough, no longer very agitated,  I have reviewed x-ray of left arm in November 12 2015 : Impacted and mildly medially displaced fracture of the anatomical neck of the humerus, extending into the greater tuberosity. Minimally displaced nonarticular fracture of the distal clavicle. The glenohumeral and acromio clavicular joints are approximated. No lytic or blastic osseous lesion.    REVIEW OF SYSTEMS: Full 14 system review of systems performed and notable only for those listed, all others are neg:  Runny nose, trouble swallowing, drooling, incontinence of bladder, walking  difficulty, speech difficulty, weakness, agitation, behavior problem, confusion, decreased concentration anxiety, hallucinations  ALLERGIES: Allergies  Allergen Reactions  . Lactose Intolerance (Gi) Nausea And Vomiting  . Penicillins Rash    Has patient had a PCN reaction causing immediate rash, facial/tongue/throat swelling, SOB or lightheadedness with hypotension: NO Has patient had a PCN reaction causing severe rash involving mucus membranes or skin necrosis: NO Has patient had a PCN reaction that required hospitalization: NO Has patient had a PCN reaction occurring within the last 10 years: NO If all of the above answers are "NO", then may proceed with Cephalosporin use. Pt can tolerate cephalosporins    HOME MEDICATIONS: Outpatient Prescriptions Prior to Visit  Medication Sig Dispense Refill  . acetaminophen (TYLENOL) 325 MG tablet Take 650 mg by mouth every 4 (four) hours as needed for mild pain, moderate pain or fever.     . ALPRAZolam (XANAX) 0.25 MG tablet Take 1 tablet (0.25 mg total) by mouth at bedtime as needed for anxiety. (Patient taking differently: Take 0.25 mg by mouth daily. ) 30 tablet 5  . ALPRAZolam (XANAX) 0.25 MG tablet Take 0.125-0.25 mg by mouth every 6 (six) hours as needed for anxiety.    Marland Kitchen aspirin EC 81 MG tablet Take 81 mg by mouth daily.    Marland Kitchen atenolol (TENORMIN) 25 MG tablet Take 12.5 mg by mouth 2 (two) times daily.    Marland Kitchen atorvastatin (LIPITOR) 10 MG tablet Take 10 mg by mouth daily.    . Calcium-Vitamin D-Vitamin K 500-200-40 MG-UNT-MCG CHEW Chew 1 Dose by mouth 2 (two) times daily.    . carbidopa-levodopa (SINEMET IR) 25-100 MG tablet Take 2 tablets by mouth 3 (three) times daily.    . Cholecalciferol (HM VITAMIN D3) 4000 units CAPS Take 4,000 Units by mouth daily.    Marland Kitchen gabapentin (NEURONTIN) 400 MG capsule Take 400 mg by mouth at bedtime.    . hydroxypropyl methylcellulose / hypromellose (ISOPTO TEARS / GONIOVISC) 2.5 % ophthalmic solution Place 1 drop  into both eyes as needed for dry eyes. Reported on 06/20/2015    . LamoTRIgine XR 200 MG TB24 Take 1 tablet (200 mg total) by mouth at bedtime. 90 tablet 4  . LORazepam (ATIVAN) 1 MG tablet Take 1 tablet (1 mg total) by mouth every 8 (eight) hours as needed for anxiety. 10 tablet 0  . OLANZapine (ZYPREXA) 2.5 MG tablet Take 2.5 mg by mouth every morning.    Marland Kitchen OLANZapine (ZYPREXA) 5 MG tablet Take 5 mg by mouth at bedtime.    . polyethylene glycol powder (GLYCOLAX/MIRALAX) powder Take 17 g by mouth daily as needed for mild constipation or moderate constipation.    . potassium chloride (K-DUR) 10 MEQ tablet Take 10 mEq by mouth 2 (two) times daily.    . ranitidine (ZANTAC) 300 MG tablet Take 300 mg by mouth daily.    . Skin Protectants, Misc. (ENDIT EX) Apply topically as needed (for irritation).    . torsemide (DEMADEX) 20 MG tablet Take  10 mg by mouth daily as needed (for fluid).    . traMADol (ULTRAM) 50 MG tablet Take 1-2 tablets (50-100 mg total) by mouth every 6 (six) hours as needed (Pain). 24 tablet 0   No facility-administered medications prior to visit.    PAST MEDICAL HISTORY: Past Medical History  Diagnosis Date  . Hyperlipemia   . IBS (irritable bowel syndrome)   . Osteoporosis   . Fibromyalgia   . Glaucoma     HAD LASER SURGERY - NO EYE DROPS REQUIRED  . Globus hystericus   . Fibromyalgia   . Meningioma ([place])   . CAD (coronary artery disease)     HEART STENT PLACED ABOUT 2000- NO LONGER SEES CARDIOLOGIST; NO C/O OF CHEST PAINS OR SOB  . Heart murmur     MVP SINCE THE 60'S - TAKES ATENOLOL -  . Anxiety   . Swelling     BILATERAL FEET AND LEGS - ONSET OF SWELLING APRIL 2014  . GERD (gastroesophageal reflux disease)   . Arthritis   . Fracture   . CVA (cerebral vascular accident) ([place])     STROKE [facility] [place] TO REMOVE BENIGN MENINGIOMA - HAS LEFT SIDED WEAKNESS AND A LITTLE NUMBNESS  . Pain     "EXCRUCIATING PAIN" LEFT HIP - HAS A FRACTURE - IS  CONFINED TO W/C AT HOME - NO WEIGHT BEARING LEFT HIP.  Marland Kitchen Parkinson's disease ([place])     PAST SURGICAL HISTORY: Past Surgical History  Procedure Laterality Date  . Brain surgery      tumor removed  . Tonsillectomy    . Partial hysterectomy    . Glaucoma repair    . Cataract extraction    . Kidney surgery      left - HX ENLARGED LEFT KIDNEY ON XRAY - SURGERY WAS DONE ON URETER   . Colonoscopy    . Coronary angioplasty    . Hip arthroplasty Left 12/29/2012    Procedure: LEFT HIP HEMI ARTHROPLASTY ;  Surgeon: Mauri Pole, MD;  Location: [facility];  Service: Orthopedics;  Laterality: Left;    FAMILY HISTORY: Family History  Problem Relation Age of Onset  . Liver disease Father   . Diabetes Father   . Coronary artery disease Mother     deseased  . Heart attack Mother   . Hyperlipidemia Mother   . Breast cancer Sister     x 2  . Cancer Sister     breast    SOCIAL HISTORY: Social History   Social History  . Marital Status: Married    Spouse Name: N/A  . Number of Children: 2  . Years of Education: 16   Occupational History  . retired Pharmacist, hospital    Social History Main Topics  . Smoking status: Never Smoker   . Smokeless tobacco: Never Used  . Alcohol Use: No  . Drug Use: No  . Sexual Activity: Not on file   Other Topics Concern  . Not on file   Social History Narrative   Lives at home with husband.   Right- handed   Occasional caffeine use.     PHYSICAL EXAM  Filed Vitals:   11/16/15 1517  BP: 109/59  Pulse: 96   There is no weight on file to calculate BMI.   PHYSICAL EXAMNIATION:  Gen: NAD, conversant, well nourised, obese, well groomed  Cardiovascular: Regular rate rhythm, no peripheral edema, warm, nontender. Eyes: Conjunctivae clear without exudates or hemorrhage Neck: Supple, no carotid bruise. Pulmonary: Clear to auscultation bilaterally   NEUROLOGICAL EXAM:  MENTAL STATUS: Speech:    Speech isSlurred and soft, fluent  and spontaneous with normal comprehension.  Cognition: MMSE 18 /30, Animal naming 9.     Orientation: She is not oriented to date, day, month    recent and remote memory: She missed 3/3 recalls     Attention span and concentration: She has difficulties spell world backwards     Normal Language, naming, repeating,spontaneous speech, she has difficulty copy design, she has difficulty following 3 steps command     Fund of knowledge   CRANIAL NERVES: CN II: Visual fields are full to confrontation. Pupils are round equal and briskly reactive to light. CN III, IV, VI: extraocular movement are normal. No ptosis. CN V: Facial sensation is intact to pinprick in all 3 divisions bilaterally. Corneal responses are intact.  CN VII: Face is symmetric with normal eye closure and smile. CN VIII: Hearing is normal to rubbing fingers CN IX, X: Palate elevates symmetrically. Phonation is normal. CN XI: Head turning and shoulder shrug are intact CN XII: Tongue is midline with normal movements and no atrophy.  MOTOR: Rigidity of 4 extremity, limited range of motion of left arm due to left humeral fracture  REFLEXES: Reflexes are 2+ and symmetric at the biceps, triceps, knees, and ankles. Plantar responses are flexor.  SENSORY: Intact to light touch, pinprick, positional and vibratory sensation are intact in fingers and toes.  COORDINATION: No truncal or limb dysmetric noticed  GAIT/STANCE: Deferred     DIAGNOSTIC DATA (LABS, IMAGING, TESTING) - I reviewed patient records, labs, notes, testing and imaging myself where available.  Lab Results  Component Value Date   WBC 4.1 11/02/2015   HGB 11.4* 11/02/2015   HCT 35.1 11/02/2015   MCV 91.6 11/02/2015   PLT 205 11/02/2015      Component Value Date/Time   NA 140 11/02/2015 1200   NA 144 06/20/2015 1534   K 4.1 11/02/2015 1200   CL 104 11/02/2015 1200   CO2 27 11/02/2015 1200   GLUCOSE 80 11/02/2015 1200   GLUCOSE 150* 06/20/2015 1534    BUN 25* 11/02/2015 1200   BUN 25 06/20/2015 1534   CREATININE 0.91 11/02/2015 1200   CREATININE 0.82 04/02/2014 1301   CALCIUM 9.6 11/02/2015 1200   PROT 7.5 11/02/2015 1200   PROT 6.8 02/18/2015 1116   ALBUMIN 4.1 11/02/2015 1200   ALBUMIN 4.1 02/18/2015 1116   AST 20 11/02/2015 1200   ALT 6* 11/02/2015 1200   ALKPHOS 126 11/02/2015 1200   BILITOT 0.6 11/02/2015 1200   BILITOT 0.5 02/18/2015 1116   GFRNONAA 59* 11/02/2015 1200   GFRAA >60 11/02/2015 1200   Lab Results  Component Value Date   CHOL 155 02/18/2015   HDL 70 02/18/2015   LDLCALC 74 02/18/2015   TRIG 54 02/18/2015   CHOLHDL 2.2 02/18/2015    Lab Results  Component Value Date   VITAMINB12 801 09/17/2015   Lab Results  Component Value Date   TSH 2.851 09/17/2015   ASSESSMENT AND PLAN  81 y.o. year old female  Parkinson's disease  Keep sinemet 25/100 2 tabs three times a day  Vascular dementia with behavior issues  Keep zyprexa to 5 mg twice a day  Taper off gabapentin, Cymbalta, Remeron to avoid the polypharmacy side effect  Right encephalomalacia, history of  partial seizure  Change lamotrigine to 100 mg dissolvable twice a day  Mildly Dysphagia  Initiate physical therapy    Marcial Pacas, M.D. Ph.D.  [facility] [place], [place] [address] Phone: [phone] Fax:      [phone]

## 2015-11-18 ENCOUNTER — Telehealth: Payer: Self-pay | Admitting: Family Medicine

## 2015-11-18 NOTE — Telephone Encounter (Signed)
Hospice called back and said to disregard. Hospice physician will handle.

## 2015-11-18 NOTE — Telephone Encounter (Signed)
Please find out from hospice the quantity of lorazepam that they are requesting

## 2015-11-18 NOTE — Telephone Encounter (Signed)
Janene at [facility] called and said that patient would be out of her lorazepam soon.  They are requesting refills to be sent in the next hour or two.    Lorazepam 1.5 mg at bedtime Lorazepam ointment 1 mg/mL, apply 15mL every 4 hours PRN agitation   [facility] [phone]  Put "Hospice" on Rx.

## 2015-11-18 NOTE — Telephone Encounter (Signed)
Apparently the attending physician with hospice is taking care

## 2015-11-23 ENCOUNTER — Ambulatory Visit: Payer: Medicare Other | Admitting: Family Medicine

## 2015-11-28 HISTORY — PX: SHOULDER SURGERY: SHX246

## 2015-12-05 ENCOUNTER — Telehealth: Payer: Self-pay | Admitting: Neurology

## 2015-12-05 ENCOUNTER — Other Ambulatory Visit: Payer: Self-pay | Admitting: *Deleted

## 2015-12-05 MED ORDER — TRAMADOL HCL 50 MG PO TABS: 50.0000 mg | ORAL_TABLET | Freq: Four times a day (QID) | ORAL | Status: AC | PRN

## 2015-12-05 MED ORDER — LORAZEPAM 1 MG PO TABS: 1.0000 mg | ORAL_TABLET | Freq: Three times a day (TID) | ORAL | Status: AC | PRN

## 2015-12-05 NOTE — Telephone Encounter (Signed)
Dr. Krista Blue has approved the requested prescriptions. They have been faxed and confirmed to the number provided.

## 2015-12-05 NOTE — Telephone Encounter (Signed)
[facility] [phone] (f) [phone] called request rx for LORazepam (ATIVAN) 1 MG tablet and traMADol (ULTRAM) 50 MG tablet  faxed to [facility] [phone].

## 2015-12-07 ENCOUNTER — Ambulatory Visit: Payer: Medicare Other | Admitting: Cardiovascular Disease

## 2015-12-12 ENCOUNTER — Ambulatory Visit: Payer: Medicare Other | Admitting: Neurology

## 2016-01-02 ENCOUNTER — Encounter: Payer: Self-pay | Admitting: Neurology

## 2016-01-02 ENCOUNTER — Ambulatory Visit (INDEPENDENT_AMBULATORY_CARE_PROVIDER_SITE_OTHER): Admitting: Neurology

## 2016-01-02 VITALS — BP 107/61 | HR 82

## 2016-01-02 DIAGNOSIS — R569 Unspecified convulsions: Secondary | ICD-10-CM | POA: Diagnosis not present

## 2016-01-02 DIAGNOSIS — G20 Parkinson's disease: Secondary | ICD-10-CM

## 2016-01-02 DIAGNOSIS — F0391 Unspecified dementia with behavioral disturbance: Secondary | ICD-10-CM

## 2016-01-02 DIAGNOSIS — I63511 Cerebral infarction due to unspecified occlusion or stenosis of right middle cerebral artery: Secondary | ICD-10-CM | POA: Diagnosis not present

## 2016-01-02 DIAGNOSIS — R269 Unspecified abnormalities of gait and mobility: Secondary | ICD-10-CM

## 2016-01-02 DIAGNOSIS — F03918 Unspecified dementia, unspecified severity, with other behavioral disturbance: Secondary | ICD-10-CM

## 2016-01-02 NOTE — Progress Notes (Signed)
Chief Complaint  Patient presents with  . Parkinson's Disease    She is here with her husband, Samantha Hudson and daugther, Samantha Hudson.  She is at home now under the care of [place]. She has had more falls and had to have a left shoulder replacement in July, 10 2017 at [facility].  . Seizures    Reports no seizure activity.  Samantha Hudson has stopped lamotrigine because it is too hard for her to swallow.  . Memory Loss    MMSE 12/30 - 4 animals.  Her memory has continued to decline.     Samantha Hudson  PATIENT: Samantha Hudson DOB: [birth year]   REASON FOR VISIT: Follow-up for Parkinson's disease, abnormality of gait, delirium HISTORY FROM: Husband and daughters    HISTORY OF PRESENT ILLNESS: HISTORY Samantha Hudson is a 81 years old right-handed Caucasian female, accompanied by her husband, a retired Software engineer, referred by her primary care physician Dr. Sallee Lange for evaluation of worsening gait difficulty, difficulty initiate gait She had a history of right meningioma, status post resection in 2007, 2 days later, she suffered a large right MCA stroke, also with past medical history of coronary artery disease, status post stent placement, she recovered very well, was able to drive, ambulate without assistance, but with residual left hemiparesthesia, often unpleasant deep achy pain involving left side of her body, She had osteoporosis, left hip fracture in 2014, require replacement in August 2014, since surgery, she had a great decline of her ambulatory ability, she now rely on her walker In 2014, she also developed loss sense of smell, REM sleep disorder, screaming out of her dreams, mild constipation, now she noticed right hand tremor, small handwriting since 2015, mild memory trouble, worsening gait difficulty, difficulty initiate walking using her right leg, tendency to lean backwards, worsening left-sided pain, especially around her left hip, left anterior thigh. She is  taking gabapentin, which has been helpful, but 400 mg 4 times a day, will make her sleepy, she is only taking 900 mg daily now, continue have excessive fatigue, daytime sleepiness, difficult to read, double vision.  We have reviewed MRI of the brain August 04 2014, demonstrate large size right MCA stroke, involving right medial, and lateral temporal lobe, no acute lesions.  April 20 first 2016 Since her initial visit August 12 2014, because of mild parkinsonian features, I have started Sinemet 25/100 one tablet 3 times a day, she is taking it at 10, 3, and 9 PM, no significant improvement, no significant side effect, she complains mild low back pain, continue significant gait difficulty, difficulty picking up her right leg  UPDATE November 11 2014: She can move better after Sinemet dosage was increased to 25/100 mg 4 times a day, physical therapy was helpful, husband reported that she takes Sinemet 1 hour before any meal, become center of her daily activity, She denies significant memory trouble, complains of chronic neck pain, bilateral shoulder pain, left hip pain, low back pain, We have reviewed her MRI lumbar in May 2016, There is lumbosacral transitional anatomy. This report assumes that there are 5 lumbar type vertebral bodies. Recommend close correlation with radiographs if intervention is elected. Moderate multilevel lumbar spondylosis. Degenerative disease is most pronounced at L3-L4 potentially affecting both exiting L3 nerves. Healed sacral insufficiency fractures demonstrated on prior nuclear medicine bone scan.  UPDATE Mar 14 2015: Around Sep 2016, she woke up one night she has bite her left lateral tongue, maybe with urinary incontinence, she is  at risk for seizure, we have personally reviewed MRI of the brain with and without contrast in March 2016, large right MCA encephalomalacia involving right temporal parietal frontal lobe.She is taking Sinemet 25/100 ii tid, at 7, 12, 5 PM, which does  help her parkinsonian features, she has occasionally upset stomach, husband is concerned about potential interaction of Azilect with tramadol, which she is taking as needed for low back pain  UPDATE Jun 14 2015:She has mild difficult with swallowing, only limited to take medication tablet, has no difficulty swallowing her food, Since Keppra XL our 750 mg was started in October 2016, she had one more episodes of woke up has bite her tongue in December 2016, husband also noticed she has increased agitations, continue has mild unsteady gait, left-sided low back pain, hip pain  UPDATE March 29th 2017: She only tried lamotrigine ER 50 mg every night in January, decided not to take it, worried about the side effects, she now taking Sinemet 25/100 mg 2 tablets 3 times a day, tolerating it well, ambulate much better, she can walk 1 mile now She has no recurrent seizure, she did presented to emergency room in August 13 2015 for complaints of shortness of breath, chest pain, troponin was negative, Echocardiogram showed ejection fraction 60%, laboratory showed normal BMP, mild anemia, hemoglobin of 10 point 5.Sinemet and physical therapy has helped her walking UPDATE  09/19/2015 CMMs. Samantha Hudson 81 year old female returns for follow-up. She had a hospital admission to [facility] over the weekend for 2 days with diagnosis of acute encephalopathy and paranoia  And acute  kidney injury. She was disoriented. Her lamotrigine was changed about a month ago and her Keppra was slowly titrated. Her husband reports one episode of biting her time in the last month. CT of the head the hospital revealed no acute intracranial abnormalities but extensive encephalomalacia. UTI was ruled out. She has a canvas bag with her with her bill fold and a nightgown and she will not let the family look at this.She does not want the husband to leave her sight. She is refusing to take medications. She also has a history of Parkinson's disease and is on  carbidopa levodopa. She has a history of stroke is also on Aggrenox twice daily . She has a 24 7 caregiver .The family called in this morning wanted her to be seen on an urgent basis. Update June eighth 2017:  She was last seen by nurse practitioner Hoyle Sauer in June first 2017 for worsening paranoid, agitation, difficulty sleeping we started low-dose seroquel along with Nuplazid Unfortunately, she fell at May 5th, 6th and 7th, was taken to emergency room, found she has broken multiple ribs, sternum. She is now discharged to rehab, was noted to have increased confusion, agitation, paranoid, she was off Nuplazid since June 5th, was started on Zyprexa 5mg  qhs, with no clear improvement, also was given xanax prn. Her behavior issue is so dominant in current situation, there was a discussion of possible memory units placement versus hospice care,  I personally reviewed CAT scan of the brain without contrast in May eighth 2017: Unchanged extra-axial blood overlying the left cerebral hemisphere to a depth of 3-4 mm. Large right side encephalomalacia, evidence of right temporoparietal craniotomy.  Update November 16 2015: She is now at nursing facility memory unit, also initiate hospice care, unfortunately she fell broke her left anatomic neck of humor in November 12 2015, is in shoulder pain, no longer move about, she is now taking Zyprexa 5  mg twice a day, has been tapered off Cymbalta, mild swallowing difficulty, weak cough, no longer very agitated,  I have reviewed x-ray of left arm in November 12 2015 : Impacted and mildly medially displaced fracture of the anatomical neck of the humerus, extending into the greater tuberosity. Minimally displaced nonarticular fracture of the distal clavicle. The glenohumeral and acromio clavicular joints are approximated. No lytic or blastic osseous lesion.   UPDATE August 14th 2017:  She injured her left shoulder second time, eventually had left shoulder replacement at [facility]  in July 2017, which did help her left shoulder pain, she is now back home, with hospice care, 24 x7 2 rounds of personal care, her husband also take her off lamotrigine 100 mg twice a day, there was no recurrent seizure like event, she seems to be more alert, less agitated with less medications,  She continues to  take her Sinemet 2 tablets only twice a day, Zyprexa 5 mg twice a day, worsening slurred speech, swallowing difficulties memory loss, sitting in the chair most of the time, no longer ambulatory.  REVIEW OF SYSTEMS: Full 14 system review of systems performed and notable only for those listed, all others are neg:  Incontinence of bladder  ALLERGIES: Allergies  Allergen Reactions  . Lactose Intolerance (Gi) Nausea And Vomiting  . Penicillins Rash    Has patient had a PCN reaction causing immediate rash, facial/tongue/throat swelling, SOB or lightheadedness with hypotension: NO Has patient had a PCN reaction causing severe rash involving mucus membranes or skin necrosis: NO Has patient had a PCN reaction that required hospitalization: NO Has patient had a PCN reaction occurring within the last 10 years: NO If all of the above answers are "NO", then may proceed with Cephalosporin use. Pt can tolerate cephalosporins    HOME MEDICATIONS: Outpatient Medications Prior to Visit  Medication Sig Dispense Refill  . aspirin EC 81 MG tablet Take 81 mg by mouth daily.    Marland Kitchen atenolol (TENORMIN) 25 MG tablet Take 12.5 mg by mouth 2 (two) times daily.    Marland Kitchen atorvastatin (LIPITOR) 10 MG tablet Take 10 mg by mouth daily.    . carbidopa-levodopa (SINEMET IR) 25-100 MG tablet Take 2 tablets by mouth 3 (three) times daily.    . DULoxetine (CYMBALTA) 30 MG capsule Take 30 mg by mouth daily.    Marland Kitchen ENSURE PLUS (ENSURE PLUS) LIQD Take 237 mLs by mouth daily.    Marland Kitchen LORazepam (ATIVAN) 1 MG tablet Take 1 tablet (1 mg total) by mouth every 8 (eight) hours as needed for anxiety. 60 tablet 3  . Magnesium Hydroxide  (MILK OF MAGNESIA PO) Take by mouth daily.    Marland Kitchen Neomycin-Bacitracin-Polymyxin (NEOSPORIN EX) Apply topically at bedtime as needed.    Marland Kitchen OLANZapine (ZYPREXA) 5 MG tablet Take 5 mg by mouth 2 (two) times daily.    . polyethylene glycol powder (GLYCOLAX/MIRALAX) powder Take 17 g by mouth daily as needed for mild constipation or moderate constipation.    . Pseudoephedrine-DM-GG (ROBAFEN CF PO) Take by mouth at bedtime as needed.    . Skin Protectants, Misc. (ENDIT EX) Apply topically as needed (for irritation).    . traMADol (ULTRAM) 50 MG tablet Take 1-2 tablets (50-100 mg total) by mouth every 6 (six) hours as needed (Pain). 60 tablet 3  . acetaminophen (TYLENOL) 325 MG tablet Take 650 mg by mouth every 4 (four) hours as needed for mild pain, moderate pain or fever.     . Cholecalciferol (  HM VITAMIN D3) 4000 units CAPS Take 4,000 Units by mouth daily.    . hydroxypropyl methylcellulose / hypromellose (ISOPTO TEARS / GONIOVISC) 2.5 % ophthalmic solution Place 1 drop into both eyes as needed for dry eyes. Reported on 06/20/2015    . LamoTRIgine 100 MG TBDP Take 1 tablet (100 mg total) by mouth 2 (two) times daily. 60 tablet 11  . loperamide (IMODIUM) 2 MG capsule Take by mouth as needed for diarrhea or loose stools.    . potassium chloride (K-DUR) 10 MEQ tablet Take 10 mEq by mouth 2 (two) times daily.    . ranitidine (ZANTAC) 300 MG tablet Take 300 mg by mouth daily.     No facility-administered medications prior to visit.     PAST MEDICAL HISTORY: Past Medical History:  Diagnosis Date  . Anxiety   . Arthritis   . CAD (coronary artery disease)    HEART STENT PLACED ABOUT 2000- NO LONGER SEES CARDIOLOGIST; NO C/O OF CHEST PAINS OR SOB  . CVA (cerebral vascular accident) ([place])    STROKE 7 [place] TO REMOVE BENIGN MENINGIOMA - HAS LEFT SIDED WEAKNESS AND A LITTLE NUMBNESS  . Fibromyalgia   . Fibromyalgia   . Fracture   . GERD (gastroesophageal reflux disease)   .  Glaucoma    HAD LASER SURGERY - NO EYE DROPS REQUIRED  . Globus hystericus   . Heart murmur    MVP SINCE THE 60'S - TAKES ATENOLOL -  . Hyperlipemia   . IBS (irritable bowel syndrome)   . Meningioma ([place])   . Osteoporosis   . Pain    "EXCRUCIATING PAIN" LEFT HIP - HAS A FRACTURE - IS CONFINED TO W/C AT HOME - NO WEIGHT BEARING LEFT HIP.  Marland Kitchen Parkinson's disease ([place])   . Swelling    BILATERAL FEET AND LEGS - ONSET OF SWELLING APRIL 2014    PAST SURGICAL HISTORY: Past Surgical History:  Procedure Laterality Date  . BRAIN SURGERY     tumor removed  . CATARACT EXTRACTION    . COLONOSCOPY    . CORONARY ANGIOPLASTY    . GLAUCOMA REPAIR    . HIP ARTHROPLASTY Left 12/29/2012   Procedure: LEFT HIP HEMI ARTHROPLASTY ;  Surgeon: Mauri Pole, MD;  Location: [facility];  Service: Orthopedics;  Laterality: Left;  . KIDNEY SURGERY     left - HX ENLARGED LEFT KIDNEY ON XRAY - SURGERY WAS DONE ON URETER   . PARTIAL HYSTERECTOMY    . SHOULDER SURGERY Left 11/28/2015   Replacement  . TONSILLECTOMY      FAMILY HISTORY: Family History  Problem Relation Age of Onset  . Liver disease Father   . Diabetes Father   . Coronary artery disease Mother     deseased  . Heart attack Mother   . Hyperlipidemia Mother   . Breast cancer Sister     x 2  . Cancer Sister     breast    SOCIAL HISTORY: Social History   Social History  . Marital status: Married    Spouse name: N/A  . Number of children: 2  . Years of education: 16   Occupational History  . retired Pharmacist, hospital Retired   Social History Main Topics  . Smoking status: Never Smoker  . Smokeless tobacco: Never Used  . Alcohol use No  . Drug use: No  . Sexual activity: Not on file   Other Topics Concern  . Not on file  Social History Narrative   Lives at home with husband.   Right- handed   Occasional caffeine use.     PHYSICAL EXAM  Vitals:   01/02/16 1549  BP: 107/61  Pulse: 82   There is no height or weight on file  to calculate BMI.   PHYSICAL EXAMNIATION:  Gen: NAD, conversant, well nourised, obese, well groomed                     Cardiovascular: Regular rate rhythm, no peripheral edema, warm, nontender. Eyes: Conjunctivae clear without exudates or hemorrhage Neck: Supple, no carotid bruise. Pulmonary: Clear to auscultation bilaterally   NEUROLOGICAL EXAM:  MENTAL STATUS: Speech:    Speech isSlurred and soft, fluent and spontaneous with normal comprehension.  Cognition: MMSE 12 /30, Animal naming 4.     Orientation: She is not oriented to date, day, month, place    recent and remote memory: She missed 3/3 recalls     Attention span and concentration: She has difficulties spell world backwards     Normal Language, naming, repeating,spontaneous speech, she has difficulty copy design, could not follow 3 steps command     Fund of knowledge   CRANIAL NERVES: CN II: Visual fields are full to confrontation. Pupils are round equal and briskly reactive to light. CN III, IV, VI: extraocular movement are normal. No ptosis. CN V: Facial sensation is intact to pinprick in all 3 divisions bilaterally. Corneal responses are intact.  CN VII: Face is symmetric with normal eye closure and smile. CN VIII: Hearing is normal to rubbing fingers CN IX, X: Palate elevates symmetrically. Phonation is normal. CN XI: Head turning and shoulder shrug are intact CN XII: Tongue is midline with normal movements and no atrophy.  MOTOR: Rigidity and bradykinesia of 4 extremity, worsening on the left upper and lower extremity, limited range of motion of left arm due to left humeral fracture, status post left shoulder replacement  REFLEXES: Reflexes are 2+ and symmetric at the biceps, triceps, knees, and ankles. Plantar responses are flexor.  SENSORY: Intact to light touch, pinprick, positional and vibratory sensation are intact in fingers and toes.  COORDINATION: No truncal or limb dysmetric  noticed  GAIT/STANCE: Deferred     DIAGNOSTIC DATA (LABS, IMAGING, TESTING) - I reviewed patient records, labs, notes, testing and imaging myself where available.  Lab Results  Component Value Date   WBC 4.1 11/02/2015   HGB 11.4 (L) 11/02/2015   HCT 35.1 11/02/2015   MCV 91.6 11/02/2015   PLT 205 11/02/2015      Component Value Date/Time   NA 140 11/02/2015 1200   NA 144 06/20/2015 1534   K 4.1 11/02/2015 1200   CL 104 11/02/2015 1200   CO2 27 11/02/2015 1200   GLUCOSE 80 11/02/2015 1200   BUN 25 (H) 11/02/2015 1200   BUN 25 06/20/2015 1534   CREATININE 0.91 11/02/2015 1200   CREATININE 0.82 04/02/2014 1301   CALCIUM 9.6 11/02/2015 1200   PROT 7.5 11/02/2015 1200   PROT 6.8 02/18/2015 1116   ALBUMIN 4.1 11/02/2015 1200   ALBUMIN 4.1 02/18/2015 1116   AST 20 11/02/2015 1200   ALT 6 (L) 11/02/2015 1200   ALKPHOS 126 11/02/2015 1200   BILITOT 0.6 11/02/2015 1200   BILITOT 0.5 02/18/2015 1116   GFRNONAA 59 (L) 11/02/2015 1200   GFRAA >60 11/02/2015 1200   Lab Results  Component Value Date   CHOL 155 02/18/2015   HDL 70 02/18/2015  LDLCALC 74 02/18/2015   TRIG 54 02/18/2015   CHOLHDL 2.2 02/18/2015    Lab Results  Component Value Date   [id number] 09/17/2015   Lab Results  Component Value Date   TSH 2.851 09/17/2015   ASSESSMENT AND PLAN  81 y.o. year old female  Parkinson's disease  Decrease sinemet 25/100 1 tab three times a day  Vascular dementia with behavior issues  Keep zyprexa to 5 mg twice a day  Taper off gabapentin, Cymbalta, Remeron to avoid the polypharmacy side effect  Right encephalomalacia, history of partial seizure  She has stopped lamotrigine 100 mg dissolvable twice a day with some improvement  Call clinic for worsening symptoms or any symptoms suspicious for partial seizure   Mildly Dysphagia  Continue speech therapy,    Marcial Pacas, M.D. Ph.D.  [facility] [place], [place]  [address] Phone: [phone] Fax:      [phone]

## 2016-01-20 DEATH — deceased

## 2016-05-01 ENCOUNTER — Other Ambulatory Visit (HOSPITAL_COMMUNITY): Payer: Medicare Other

## 2016-05-01 ENCOUNTER — Encounter (HOSPITAL_COMMUNITY): Payer: Medicare Other

## 2016-05-22 IMAGING — DX DG FOOT COMPLETE 3+V*L*
3 series · 3 of 3 positions shown · non-contrast
Comparison: None.

CLINICAL DATA: Left foot pain at the arch. Difficulty
weight-bearing.

EXAM:
LEFT FOOT - COMPLETE 3+ VIEW

[foot ap]
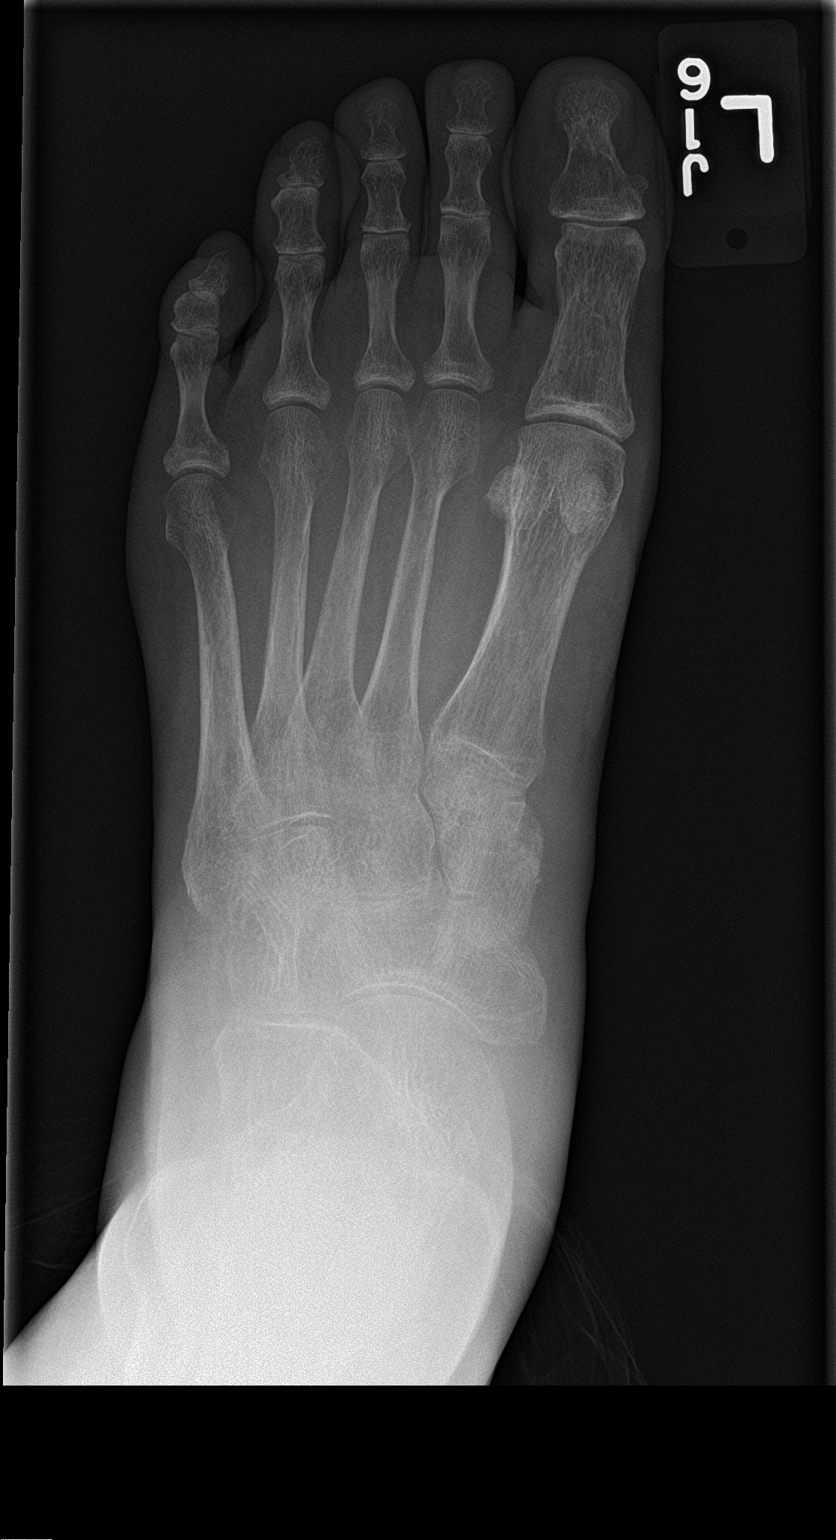

[foot obl]
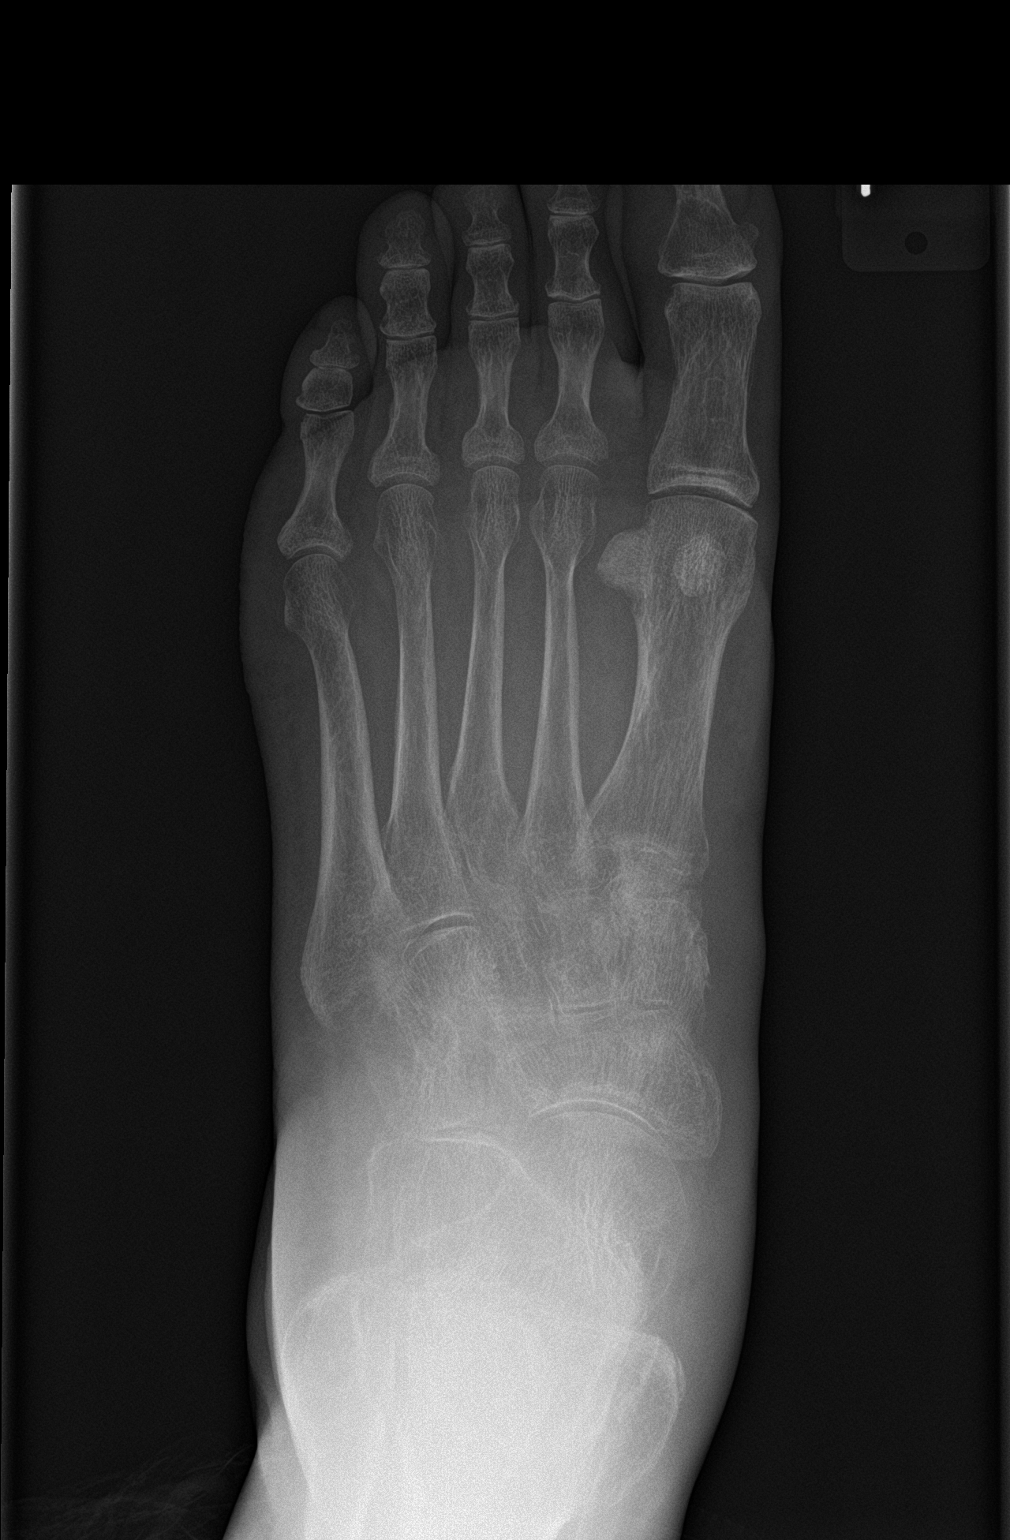

[foot lat]
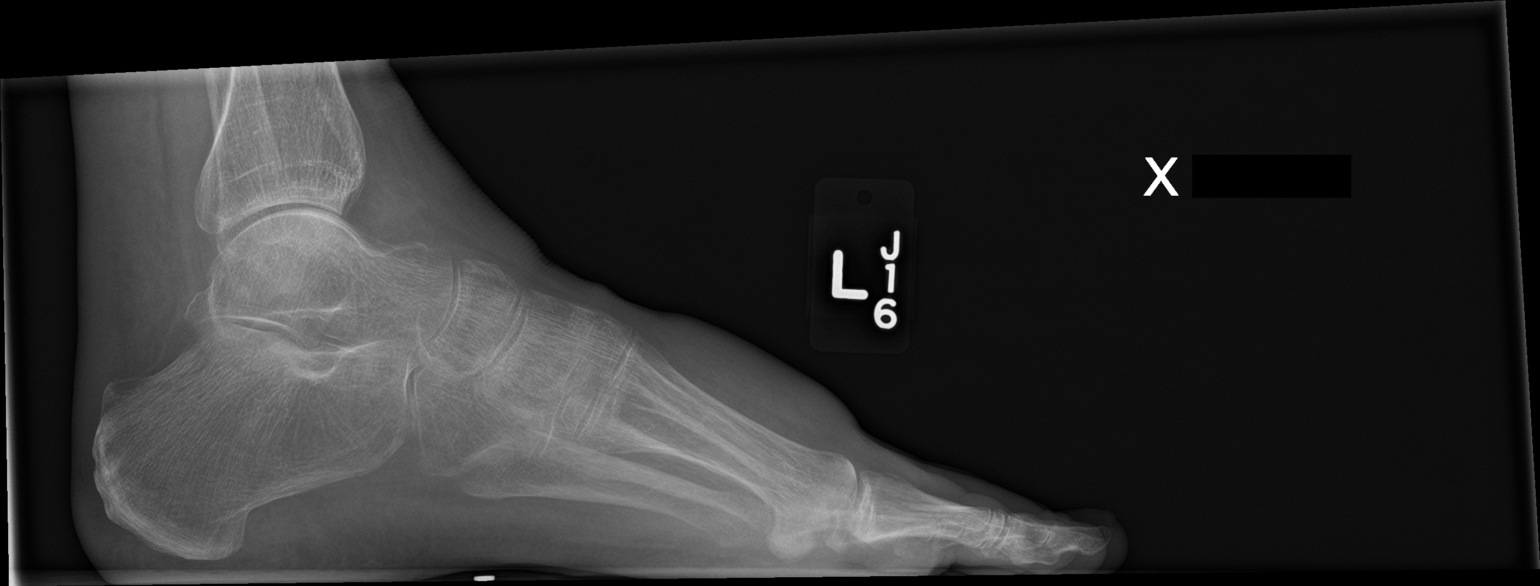

[3 of 3 positions shown; findings below may reference images not displayed]

FINDINGS: There is severe osteopenia. There is mild cortical irregularity of
the medial cortex of the medial cuneiform concerning for
nondisplaced fracture; correlate with point tenderness. There is no
evidence of arthropathy or other focal bone abnormality. Soft
tissues are unremarkable.
IMPRESSION: Mild cortical irregularity of the medial cortex of the medial
cuneiform concerning for nondisplaced fracture; correlate with point
tenderness.

## 2016-11-12 IMAGING — CT CT MAXILLOFACIAL W/O CM
3 of 9 series · 9 of 47 positions shown, 11 images · non-contrast
Comparison: CT brain 09/16/2015

CLINICAL DATA: Patient status post fall. Left-sided weakness. Left
eye bruising and swelling.

EXAM:
CT HEAD WITHOUT CONTRAST
CT MAXILLOFACIAL WITHOUT CONTRAST
CT CERVICAL SPINE WITHOUT CONTRAST
TECHNIQUE: Multidetector CT imaging of the head, cervical spine, and
maxillofacial structures were performed using the standard protocol
without intravenous contrast. Multiplanar CT image reconstructions
of the cervical spine and maxillofacial structures were also
generated.

[Series 205: sag · sagittal · 0.49mm/px · 2 of 64 slices shown]
[im 22/64  bone]
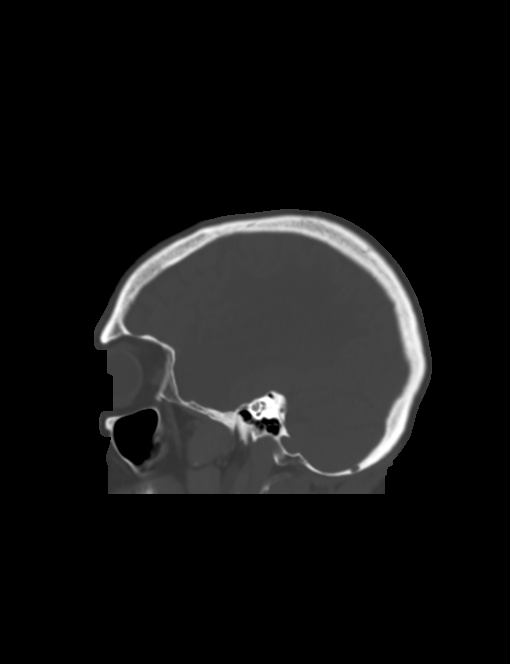
[im 43/64  bone]
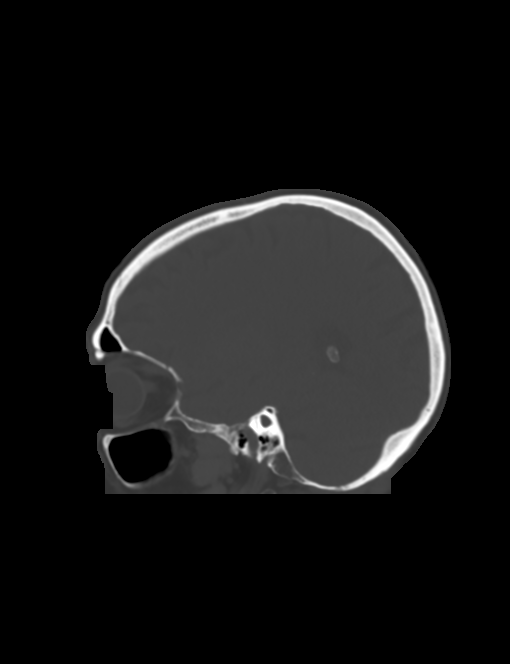

[Series 405: coronal c-spine · coronal · 0.31mm/px · 2 of 48 slices shown]
[im 16/48  bone]
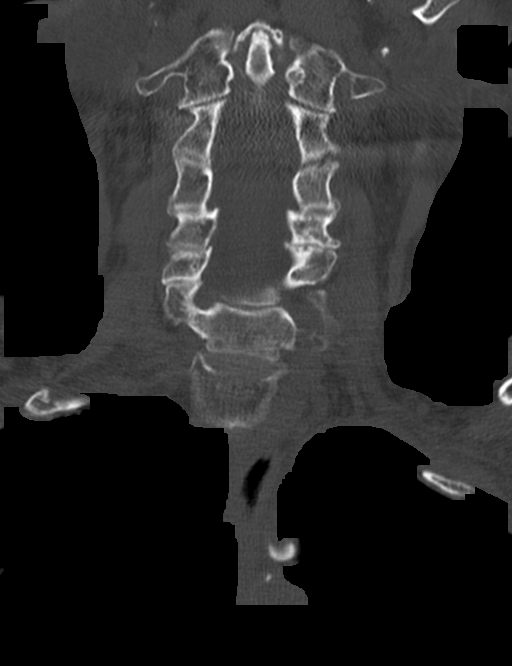
[im 32/48  bone]
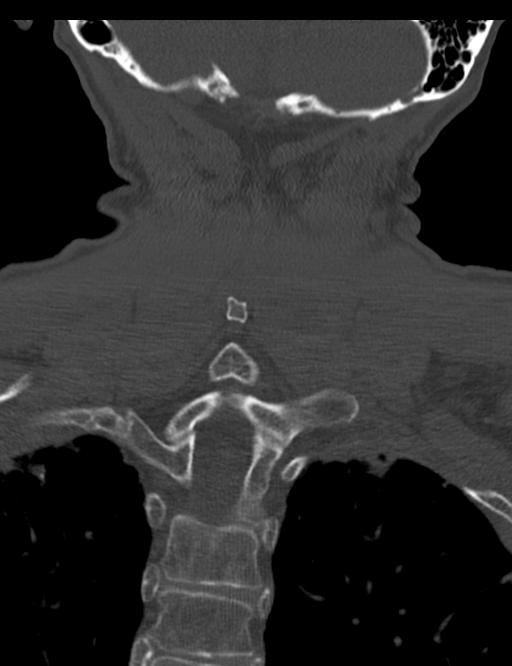

[Series 406: orthogonal c-spine · axial · 0.31mm/px · z∈[+137,+255]mm · 5 of 90 slices shown, 7 images]
[im 15/90  brain]
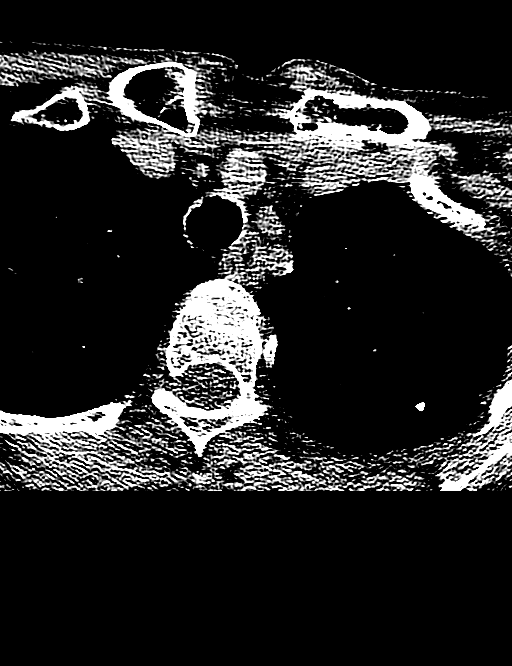
[im 15/90  bone]
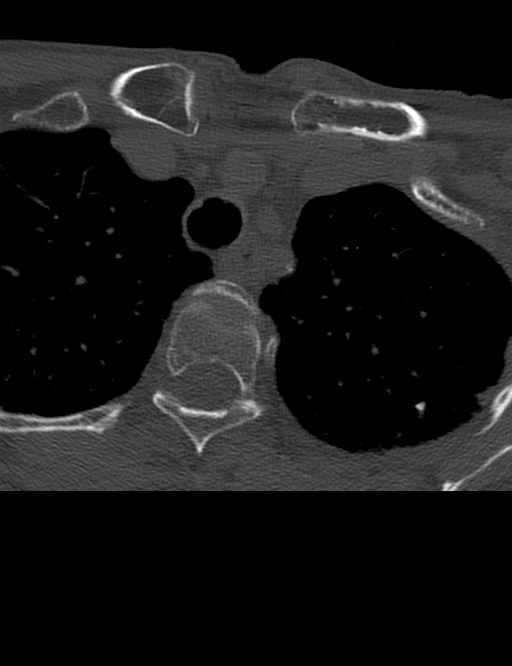
[im 30/90  bone]
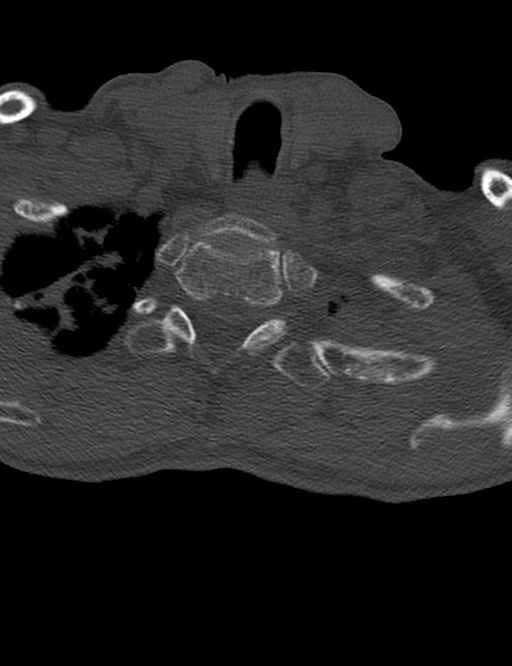
[im 45/90  bone]
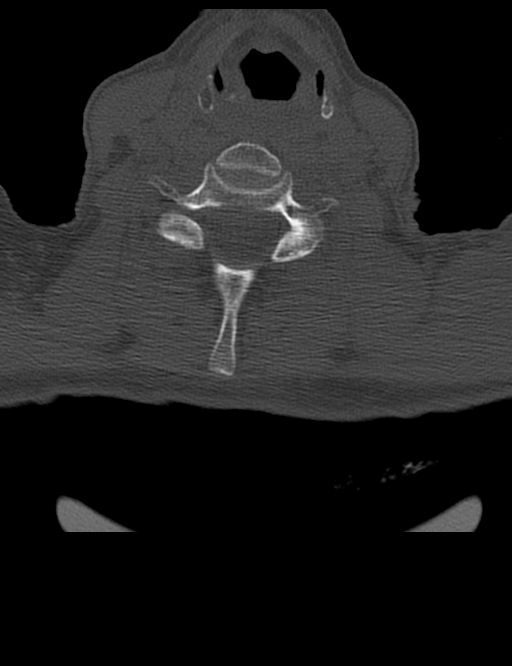
[im 60/90  bone]
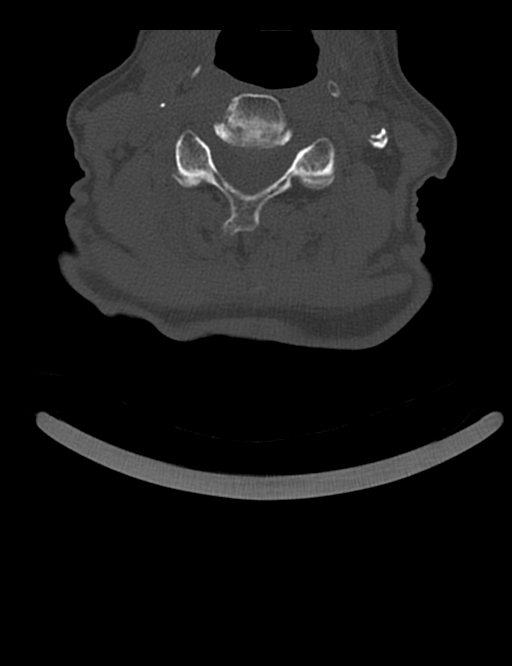
[im 75/90  brain]
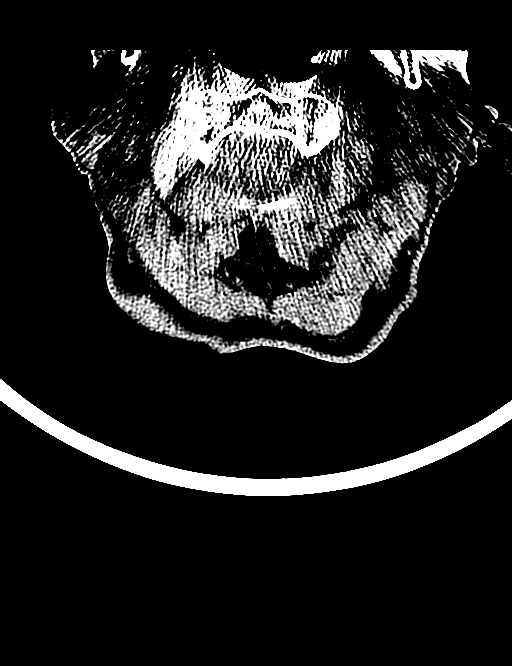
[im 75/90  bone]
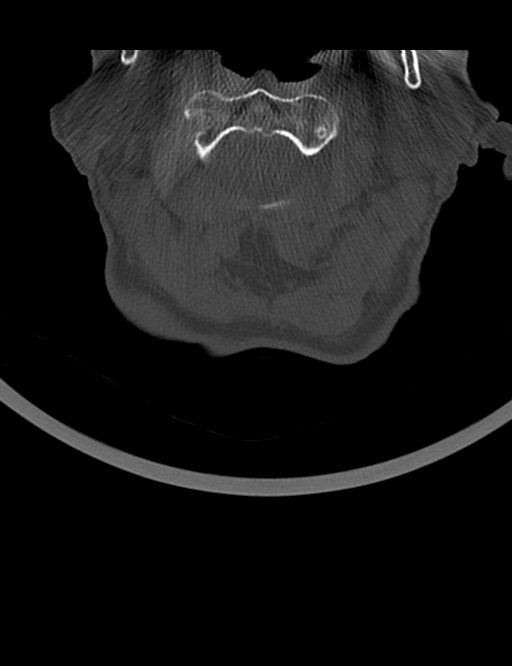

[9 of 47 positions shown; findings below may reference images not displayed]

FINDINGS: CT HEAD FINDINGS

Ventricles and sulci are appropriate for patient's age. Small amount
of extra-axial blood products overlying the left cerebral hemisphere
(image 18; series 201) measuring 4 mm in thickness, favored to be
subdural in etiology. No evidence for acute cortically based infarct
or mass lesion or mass-effect. Large area of encephalomalacia
throughout the right MCA distribution, grossly unchanged from prior.
Orbits are unremarkable. Left for a orbital soft tissue swelling.
Soft tissue hematoma overlying the left calvarium. Right pterional
craniotomy. Mastoid air cells are unremarkable. No displaced skull
fracture.

CT MAXILLOFACIAL FINDINGS

Left periorbital soft tissue swelling and hematoma formation.
Paranasal sinuses are unremarkable. No evidence for acute
maxillofacial fracture. The nasal bone, zygomatic arches and
pterygoid plates are intact. Maxilla and mandible are intact.
Orbital walls are intact.

CT CERVICAL SPINE FINDINGS

Preservation of the vertebral body and intervertebral disc space
heights. Normal anatomic alignment. No evidence for acute cervical
spine fracture. Mild facet degenerative changes left C4-5.
Prevertebral soft tissues are unremarkable. Visualized thyroid is
unremarkable. Biapical pleural parenchymal thickening and scarring.
There is a probable acute fracture of the superior aspect of the
sternum, incompletely included on current exam (image 91; series
41). Nondisplaced left anterior first rib fracture. Nondisplaced
distal left clavicle fracture.
IMPRESSION: Small amount of extra-axial blood products overlying the left
cerebral hemisphere measuring up to 4 mm in thickness, favored to be
subdural in etiology.

Sternal fracture, incompletely included on current evaluation.

Nondisplaced left anterior first rib fracture.

No acute maxillofacial fracture.

No acute cervical spine fracture.

Re- demonstrated large area of encephalomalacia within the right MCA
territory.

Left periorbital soft tissue swelling/hematoma formation.

These results were called by telephone at the time of interpretation
on 09/25/2015 at [DATE] to Dr. AIRPHYA GIFTAN , who verbally
acknowledged these results.
